# Patient Record
Sex: Female | Born: 1965 | Race: White | Hispanic: No | Marital: Married | Smoking: Former smoker
Health system: Southern US, Community
[De-identification: ages and names within clinical notes are randomized; demographics above are authoritative.]

## PROBLEM LIST (undated history)

## (undated) DIAGNOSIS — F32A Depression, unspecified: Secondary | ICD-10-CM

## (undated) DIAGNOSIS — K219 Gastro-esophageal reflux disease without esophagitis: Secondary | ICD-10-CM

## (undated) DIAGNOSIS — F1010 Alcohol abuse, uncomplicated: Secondary | ICD-10-CM

## (undated) DIAGNOSIS — F329 Major depressive disorder, single episode, unspecified: Secondary | ICD-10-CM

## (undated) DIAGNOSIS — I2510 Atherosclerotic heart disease of native coronary artery without angina pectoris: Secondary | ICD-10-CM

## (undated) DIAGNOSIS — I639 Cerebral infarction, unspecified: Secondary | ICD-10-CM

## (undated) DIAGNOSIS — Z720 Tobacco use: Secondary | ICD-10-CM

## (undated) DIAGNOSIS — I82409 Acute embolism and thrombosis of unspecified deep veins of unspecified lower extremity: Secondary | ICD-10-CM

## (undated) DIAGNOSIS — E785 Hyperlipidemia, unspecified: Secondary | ICD-10-CM

## (undated) DIAGNOSIS — I513 Intracardiac thrombosis, not elsewhere classified: Secondary | ICD-10-CM

## (undated) DIAGNOSIS — I509 Heart failure, unspecified: Secondary | ICD-10-CM

## (undated) HISTORY — DX: Atherosclerotic heart disease of native coronary artery without angina pectoris: I25.10

## (undated) HISTORY — DX: Heart failure, unspecified: I50.9

## (undated) HISTORY — DX: Intracardiac thrombosis, not elsewhere classified: I51.3

---
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
## 2019-08-04 ENCOUNTER — Encounter: Payer: Self-pay | Admitting: Emergency Medicine

## 2019-08-04 ENCOUNTER — Emergency Department: Payer: 59

## 2019-08-04 ENCOUNTER — Emergency Department
Admission: EM | Admit: 2019-08-04 | Discharge: 2019-08-04 | Disposition: A | Discharge status: Home / Self Care | Payer: 59 | Attending: Emergency Medicine | Admitting: Emergency Medicine

## 2019-08-04 ENCOUNTER — Other Ambulatory Visit: Payer: Self-pay

## 2019-08-04 DIAGNOSIS — Y999 Unspecified external cause status: Secondary | ICD-10-CM | POA: Diagnosis not present

## 2019-08-04 DIAGNOSIS — Y92019 Unspecified place in single-family (private) house as the place of occurrence of the external cause: Secondary | ICD-10-CM | POA: Diagnosis not present

## 2019-08-04 DIAGNOSIS — Y939 Activity, unspecified: Secondary | ICD-10-CM | POA: Diagnosis not present

## 2019-08-04 DIAGNOSIS — W19XXXA Unspecified fall, initial encounter: Secondary | ICD-10-CM | POA: Diagnosis not present

## 2019-08-04 DIAGNOSIS — T07XXXA Unspecified multiple injuries, initial encounter: Secondary | ICD-10-CM

## 2019-08-04 DIAGNOSIS — T148XXA Other injury of unspecified body region, initial encounter: Secondary | ICD-10-CM | POA: Insufficient documentation

## 2019-08-04 DIAGNOSIS — S32010A Wedge compression fracture of first lumbar vertebra, initial encounter for closed fracture: Secondary | ICD-10-CM | POA: Diagnosis not present

## 2019-08-04 DIAGNOSIS — M25571 Pain in right ankle and joints of right foot: Secondary | ICD-10-CM | POA: Diagnosis not present

## 2019-08-04 DIAGNOSIS — S3992XA Unspecified injury of lower back, initial encounter: Secondary | ICD-10-CM | POA: Diagnosis present

## 2019-08-04 MED ORDER — METHOCARBAMOL 500 MG PO TABS: 500.0000 mg | ORAL_TABLET | Freq: Once | ORAL | Status: DC

## 2019-08-04 MED ORDER — TRAMADOL HCL 50 MG PO TABS: 50.0000 mg | ORAL_TABLET | Freq: Four times a day (QID) | ORAL | 0 refills | Status: DC | PRN

## 2019-08-04 MED ORDER — METHOCARBAMOL 500 MG PO TABS
500.0000 mg | ORAL_TABLET | Freq: Once | ORAL | Status: AC
  Administered 2019-08-04: 09:00:00 500 mg via ORAL
  Filled 2019-08-04: qty 1

## 2019-08-04 MED ORDER — METHOCARBAMOL 500 MG PO TABS: 500.0000 mg | ORAL_TABLET | Freq: Four times a day (QID) | ORAL | 0 refills | Status: DC | PRN

## 2019-08-04 MED ORDER — TRAMADOL HCL 50 MG PO TABS
50.0000 mg | ORAL_TABLET | Freq: Once | ORAL | Status: AC
  Administered 2019-08-04: 50 mg via ORAL
  Filled 2019-08-04: qty 1

## 2019-08-04 MED ORDER — NAPROXEN 500 MG PO TABS: 500.0000 mg | ORAL_TABLET | Freq: Two times a day (BID) | ORAL | 0 refills | Status: DC

## 2019-08-04 MED ORDER — KETOROLAC TROMETHAMINE 30 MG/ML IJ SOLN
30.0000 mg | Freq: Once | INTRAMUSCULAR | Status: AC
  Administered 2019-08-04: 09:00:00 30 mg via INTRAMUSCULAR
  Filled 2019-08-04: qty 1

## 2019-08-04 NOTE — ED Notes (Signed)
Tried to get pt up on stretcher  States she hurts too bad to move provider aware

## 2019-08-04 NOTE — ED Provider Notes (Signed)
[facility] Emergency Department Provider Note  ____________________________________________   First MD Initiated Contact with Patient 08/04/19 [phone]     (approximate)  I have reviewed the triage vital signs and the nursing notes.   HISTORY  Chief Complaint Back Pain   HPI Briana Andrews is a 53 y.o. female presents to the ED via EMS with complaint of low back pain and right foot pain.  Patient states that she fell a couple of days ago.  She denies a syncopal episode causing her fall.  She denies any head injury or loss of consciousness.  She denies taking any over-the-counter medication for it as she "does not take pharmaceuticals".  She rates her pain as a 3 out of 10 unless she is trying to move.       History reviewed. No pertinent past medical history.  There are no active problems to display for this patient.   History reviewed. No pertinent surgical history.  Prior to Admission medications   Medication Sig Start Date End Date Taking? Authorizing Provider  methocarbamol (ROBAXIN) 500 MG tablet Take 1 tablet (500 mg total) by mouth every 6 (six) hours as needed for muscle spasms. 08/04/19   Tommi RumpsSummers, Becker Christopher L, PA-C  naproxen (NAPROSYN) 500 MG tablet Take 1 tablet (500 mg total) by mouth 2 (two) times daily with a meal. 08/04/19   Tommi RumpsSummers, Marquis Down L, PA-C  traMADol (ULTRAM) 50 MG tablet Take 1 tablet (50 mg total) by mouth every 6 (six) hours as needed. 08/04/19   Tommi RumpsSummers, Viviana Trimble L, PA-C    Allergies Penicillins  No family history on file.  Social History Social History   Tobacco Use  . Smoking status: Not on file  Substance Use Topics  . Alcohol use: Not on file  . Drug use: Not on file    Review of Systems Constitutional: No fever/chills Eyes: No visual changes. ENT: No trauma. Cardiovascular: Denies chest pain. Respiratory: Denies shortness of breath. Gastrointestinal: No abdominal pain.  No nausea, no vomiting. Genitourinary: Negative  for dysuria. Musculoskeletal: Positive for low back pain. Skin: Negative for rash. Neurological: Negative for headaches, focal weakness or numbness. ____________________________________________   PHYSICAL EXAM:  VITAL SIGNS: ED Triage Vitals  Enc Vitals Group     BP 08/04/19 0625 (!) 133/104     Pulse Rate 08/04/19 0625 (!) 102     Resp 08/04/19 0625 18     Temp 08/04/19 0625 97.7 F (36.5 C)     Temp Source 08/04/19 0625 Oral     SpO2 08/04/19 0625 97 %     Weight 08/04/19 0626 180 lb (81.6 kg)     Height 08/04/19 0626 5\' 5"  (1.651 m)     Head Circumference --      Peak Flow --      Pain Score 08/04/19 0626 3     Pain Loc --      Pain Edu? --      Excl. in GC? --    Constitutional: Alert and oriented. Well appearing and in no acute distress. Eyes: Conjunctivae are normal.  Head: Atraumatic. Neck: No stridor.  No cervical tenderness on palpation posteriorly. Cardiovascular: Normal rate, regular rhythm. Grossly normal heart sounds.  Good peripheral circulation. Respiratory: Normal respiratory effort.  No retractions. Lungs CTAB. Gastrointestinal: Soft and nontender. No distention.  Musculoskeletal: On exam there is no gross deformity however there is tenderness on palpation of the lumbar spine and paravertebral muscles.  Range of motion is restricted secondary to  pain and patient is guarding.  No ecchymosis or abrasions were seen.  Patient also extremely tender to her right ankle without deformity.  There is some minimal soft tissue edema present.  Pulse present.  Skin is intact.  No other muscle skeletal pain is noted with exam. Neurologic:  Normal speech and language. No gross focal neurologic deficits are appreciated.  Skin:  Skin is warm, dry and intact.  Psychiatric: Mood and affect are normal. Speech and behavior are normal.  ____________________________________________   LABS (all labs ordered are listed, but only abnormal results are displayed)  Labs Reviewed - No  data to display  RADIOLOGY  Official radiology report(s): Dg Lumbar Spine 2-3 Views  Result Date: 08/04/2019 CLINICAL DATA:  Post fall several days ago now with back pain. EXAM: LUMBAR SPINE - 2-3 VIEW COMPARISON:  None. FINDINGS: There are 5 non rib-bearing lumbar type vertebral bodies. There is a mild scoliotic curvature of the thoracolumbar spine with dominant caudal component convex the left measuring approximately 11 degrees (as measured from the superior endplate of D74 to the inferior endplate of L5). Grade 1 anterolisthesis of L4 upon L5 measuring approximately 8 mm. Age-indeterminate mild (approximately 25%) compression deformity involving the superior endplate of L1. Remaining lumbar vertebral body heights appear preserved Mild to moderate multilevel lumbar spine DDD, worse at L4-L5 and L5-S1 with disc space height loss endplate irregularity and sclerosis. Limited visualization the bilateral SI joints is normal. Atherosclerotic plaque within the abdominal aorta. Punctate phleboliths overlie the lower pelvis bilaterally. IMPRESSION: 1. Age-indeterminate mild (approximately 25%) compression deformity involving the superior endplate of L1. Correlation for point tenderness at this location is advised. 2. Mild-to-moderate multilevel lumbar spine DDD, worse at L4-L5 and L5-S1. Electronically Signed   By: Sandi Mariscal M.D.   On: 08/04/2019 08:40   Ct Lumbar Spine Wo Contrast  Result Date: 08/04/2019 CLINICAL DATA:  Fall 3 days ago EXAM: CT LUMBAR SPINE WITHOUT CONTRAST TECHNIQUE: Multidetector CT imaging of the lumbar spine was performed without intravenous contrast administration. Multiplanar CT image reconstructions were also generated. COMPARISON:  None. FINDINGS: Segmentation: 5 lumbar type vertebrae Alignment: Grade 1 degenerative anterolisthesis at L4-5. Vertebrae: Comminuted fracture of the L1 body involving anterior and posterior cortex. Trace retropulsion without spinal stenosis. No significant  height loss and no posterior element involvement. Paraspinal and other soft tissues: Paravertebral edema at the level of fracture. No measurable hematoma. Partial coverage of small pleural effusions. Disc levels: Generalized disc narrowing, advanced at L4-5 and L5-S1 where there is endplate degeneration. Lower lumbar facet arthropathy particularly severe at L4-5 where there is spurring and anterolisthesis with right foraminal stenosis. Moderate spinal stenosis at L4-5. IMPRESSION: 1. Acute, comminuted L1 body fracture with mild noncompressive retropulsion. 2. Advanced lower lumbar degenerative disease with L4-5 anterolisthesis and moderate spinal/right foraminal stenosis. 3. Partial coverage of small pleural effusions. Electronically Signed   By: Monte Fantasia M.D.   On: 08/04/2019 10:07   Dg Foot Complete Right  Result Date: 08/04/2019 CLINICAL DATA:  Injury to the right foot. EXAM: RIGHT FOOT COMPLETE - 3+ VIEW COMPARISON:  None. FINDINGS: There is diffuse soft tissue swelling about the mid forefoot. No associated fracture or dislocation. No significant hallux valgus. Joint spaces are preserved. No definite erosions. Moderate-sized plantar calcaneal spur. IMPRESSION: 1. Diffuse soft tissue swelling about the forefoot without associated fracture or dislocation. 2. Moderate-sized plantar calcaneal spur. Electronically Signed   By: Sandi Mariscal M.D.   On: 08/04/2019 08:37    ____________________________________________  PROCEDURES  Procedure(s) performed (including Critical Care):  Procedures   ____________________________________________   INITIAL IMPRESSION / ASSESSMENT AND PLAN / ED COURSE  As part of my medical decision making, I reviewed the following data within the electronic MEDICAL RECORD NUMBER Notes from prior ED visits and [place] Controlled Substance Database  53 year old female presents to the ED with complaint of back pain after a fall 3 days ago.  Patient states that she fell backwards  and now is having pain to her lower back and her right ankle.  X-rays of the right ankle was negative for fracture.  There was a L1 compression and CT confirmed that it is acute, comminuted with depression 25%.  Patient while in the ED was given Robaxin, Toradol IM and tramadol for pain.  Patient was feeling better prior to discharge.  She is to continue with naproxen, Robaxin and tramadol for pain as well as using ice or heat to her back.  She is to follow-up with Dr. Joice Lofts for follow-up and contact information was given to her.  ____________________________________________   FINAL CLINICAL IMPRESSION(S) / ED DIAGNOSES  Final diagnoses:  Compression fracture of L1 vertebra, initial encounter [facility])  Multiple contusions  Fall at home, initial encounter     ED Discharge Orders         Ordered    traMADol (ULTRAM) 50 MG tablet  Every 6 hours PRN     08/04/19 1121    methocarbamol (ROBAXIN) 500 MG tablet  Every 6 hours PRN     08/04/19 1121    naproxen (NAPROSYN) 500 MG tablet  2 times daily with meals     08/04/19 1121           Note:  This document was prepared using Dragon voice recognition software and may include unintentional dictation errors.    Tommi Rumps, PA-C 08/04/19 1346    Sharyn Creamer, MD 08/04/19 1540

## 2019-08-04 NOTE — Discharge Instructions (Addendum)
Follow-up with Dr. Roland Rack who is the orthopedist on call if any continued problems and also for follow-up.  You may use ice to your back as needed for discomfort.  Take medication only as directed.  Naproxen is 500 mg twice a day every day with food.  Methocarbamol is every 6 hours if needed for muscle spasms.  Tramadol as needed for pain.  Be aware that you should not drive or operate machinery while taking the methocarbamol and the tramadol.  Return to the emergency department if any severe worsening of your symptoms.

## 2019-08-04 NOTE — ED Notes (Signed)
See triage note  Presents with lower back pain and right foot pain  States she fell couple of days ago

## 2019-08-04 NOTE — ED Triage Notes (Signed)
Reports lower back pain for approximately 3 weeks.  Denies any known injury.

## 2019-08-04 NOTE — ED Notes (Signed)
See triage note  Presents s/p fall about 3 days ago  States she fell backwards  Having pain to lower back and right ankle  States she is not sure if she rolled her ankle  Min swelling noted   Good pulses

## 2019-08-15 ENCOUNTER — Inpatient Hospital Stay (HOSPITAL_COMMUNITY)
Admission: EM | Admit: 2019-08-15 | Discharge: 2019-09-13 | DRG: 037 | Disposition: A | Discharge status: Skilled Nursing Facility | Payer: 59 | Attending: Family Medicine | Admitting: Family Medicine

## 2019-08-15 ENCOUNTER — Other Ambulatory Visit: Payer: Self-pay

## 2019-08-15 DIAGNOSIS — I5023 Acute on chronic systolic (congestive) heart failure: Secondary | ICD-10-CM | POA: Diagnosis present

## 2019-08-15 DIAGNOSIS — E871 Hypo-osmolality and hyponatremia: Secondary | ICD-10-CM | POA: Diagnosis present

## 2019-08-15 DIAGNOSIS — Z515 Encounter for palliative care: Secondary | ICD-10-CM | POA: Diagnosis not present

## 2019-08-15 DIAGNOSIS — E861 Hypovolemia: Secondary | ICD-10-CM | POA: Diagnosis not present

## 2019-08-15 DIAGNOSIS — D751 Secondary polycythemia: Secondary | ICD-10-CM | POA: Diagnosis present

## 2019-08-15 DIAGNOSIS — Z978 Presence of other specified devices: Secondary | ICD-10-CM

## 2019-08-15 DIAGNOSIS — J9811 Atelectasis: Secondary | ICD-10-CM

## 2019-08-15 DIAGNOSIS — I82431 Acute embolism and thrombosis of right popliteal vein: Secondary | ICD-10-CM | POA: Diagnosis present

## 2019-08-15 DIAGNOSIS — I998 Other disorder of circulatory system: Secondary | ICD-10-CM | POA: Diagnosis present

## 2019-08-15 DIAGNOSIS — I5082 Biventricular heart failure: Secondary | ICD-10-CM | POA: Diagnosis present

## 2019-08-15 DIAGNOSIS — J690 Pneumonitis due to inhalation of food and vomit: Secondary | ICD-10-CM | POA: Diagnosis not present

## 2019-08-15 DIAGNOSIS — J189 Pneumonia, unspecified organism: Secondary | ICD-10-CM | POA: Diagnosis present

## 2019-08-15 DIAGNOSIS — R0602 Shortness of breath: Secondary | ICD-10-CM | POA: Diagnosis not present

## 2019-08-15 DIAGNOSIS — Z6829 Body mass index (BMI) 29.0-29.9, adult: Secondary | ICD-10-CM

## 2019-08-15 DIAGNOSIS — Z20822 Contact with and (suspected) exposure to covid-19: Secondary | ICD-10-CM

## 2019-08-15 DIAGNOSIS — I272 Pulmonary hypertension, unspecified: Secondary | ICD-10-CM | POA: Diagnosis present

## 2019-08-15 DIAGNOSIS — M549 Dorsalgia, unspecified: Secondary | ICD-10-CM | POA: Diagnosis present

## 2019-08-15 DIAGNOSIS — K760 Fatty (change of) liver, not elsewhere classified: Secondary | ICD-10-CM | POA: Diagnosis present

## 2019-08-15 DIAGNOSIS — E872 Acidosis, unspecified: Secondary | ICD-10-CM

## 2019-08-15 DIAGNOSIS — R64 Cachexia: Secondary | ICD-10-CM | POA: Diagnosis present

## 2019-08-15 DIAGNOSIS — K264 Chronic or unspecified duodenal ulcer with hemorrhage: Secondary | ICD-10-CM | POA: Diagnosis present

## 2019-08-15 DIAGNOSIS — Z880 Allergy status to penicillin: Secondary | ICD-10-CM

## 2019-08-15 DIAGNOSIS — K7030 Alcoholic cirrhosis of liver without ascites: Secondary | ICD-10-CM | POA: Diagnosis present

## 2019-08-15 DIAGNOSIS — N182 Chronic kidney disease, stage 2 (mild): Secondary | ICD-10-CM | POA: Diagnosis present

## 2019-08-15 DIAGNOSIS — D689 Coagulation defect, unspecified: Secondary | ICD-10-CM | POA: Diagnosis present

## 2019-08-15 DIAGNOSIS — I428 Other cardiomyopathies: Secondary | ICD-10-CM | POA: Diagnosis present

## 2019-08-15 DIAGNOSIS — E876 Hypokalemia: Secondary | ICD-10-CM | POA: Diagnosis not present

## 2019-08-15 DIAGNOSIS — Z66 Do not resuscitate: Secondary | ICD-10-CM | POA: Diagnosis not present

## 2019-08-15 DIAGNOSIS — I5043 Acute on chronic combined systolic (congestive) and diastolic (congestive) heart failure: Secondary | ICD-10-CM | POA: Diagnosis present

## 2019-08-15 DIAGNOSIS — I2510 Atherosclerotic heart disease of native coronary artery without angina pectoris: Secondary | ICD-10-CM | POA: Diagnosis present

## 2019-08-15 DIAGNOSIS — A419 Sepsis, unspecified organism: Secondary | ICD-10-CM | POA: Diagnosis not present

## 2019-08-15 DIAGNOSIS — K254 Chronic or unspecified gastric ulcer with hemorrhage: Secondary | ICD-10-CM | POA: Diagnosis present

## 2019-08-15 DIAGNOSIS — I509 Heart failure, unspecified: Secondary | ICD-10-CM

## 2019-08-15 DIAGNOSIS — F17200 Nicotine dependence, unspecified, uncomplicated: Secondary | ICD-10-CM | POA: Diagnosis present

## 2019-08-15 DIAGNOSIS — R278 Other lack of coordination: Secondary | ICD-10-CM | POA: Diagnosis present

## 2019-08-15 DIAGNOSIS — K7290 Hepatic failure, unspecified without coma: Secondary | ICD-10-CM | POA: Diagnosis present

## 2019-08-15 DIAGNOSIS — R17 Unspecified jaundice: Secondary | ICD-10-CM

## 2019-08-15 DIAGNOSIS — G9341 Metabolic encephalopathy: Secondary | ICD-10-CM | POA: Diagnosis present

## 2019-08-15 DIAGNOSIS — E43 Unspecified severe protein-calorie malnutrition: Secondary | ICD-10-CM | POA: Diagnosis present

## 2019-08-15 DIAGNOSIS — I739 Peripheral vascular disease, unspecified: Secondary | ICD-10-CM | POA: Diagnosis present

## 2019-08-15 DIAGNOSIS — L8990 Pressure ulcer of unspecified site, unspecified stage: Secondary | ICD-10-CM | POA: Diagnosis not present

## 2019-08-15 DIAGNOSIS — R6521 Severe sepsis with septic shock: Secondary | ICD-10-CM | POA: Diagnosis not present

## 2019-08-15 DIAGNOSIS — R402242 Coma scale, best verbal response, confused conversation, at arrival to emergency department: Secondary | ICD-10-CM | POA: Diagnosis present

## 2019-08-15 DIAGNOSIS — I63421 Cerebral infarction due to embolism of right anterior cerebral artery: Principal | ICD-10-CM | POA: Diagnosis present

## 2019-08-15 DIAGNOSIS — M4856XA Collapsed vertebra, not elsewhere classified, lumbar region, initial encounter for fracture: Secondary | ICD-10-CM | POA: Diagnosis present

## 2019-08-15 DIAGNOSIS — Z452 Encounter for adjustment and management of vascular access device: Secondary | ICD-10-CM

## 2019-08-15 DIAGNOSIS — F1020 Alcohol dependence, uncomplicated: Secondary | ICD-10-CM | POA: Diagnosis present

## 2019-08-15 DIAGNOSIS — I240 Acute coronary thrombosis not resulting in myocardial infarction: Secondary | ICD-10-CM | POA: Diagnosis present

## 2019-08-15 DIAGNOSIS — J9600 Acute respiratory failure, unspecified whether with hypoxia or hypercapnia: Secondary | ICD-10-CM | POA: Diagnosis not present

## 2019-08-15 DIAGNOSIS — I471 Supraventricular tachycardia: Secondary | ICD-10-CM | POA: Diagnosis not present

## 2019-08-15 DIAGNOSIS — R339 Retention of urine, unspecified: Secondary | ICD-10-CM | POA: Diagnosis not present

## 2019-08-15 DIAGNOSIS — Z9181 History of falling: Secondary | ICD-10-CM

## 2019-08-15 DIAGNOSIS — R402132 Coma scale, eyes open, to sound, at arrival to emergency department: Secondary | ICD-10-CM | POA: Diagnosis present

## 2019-08-15 DIAGNOSIS — R29704 NIHSS score 4: Secondary | ICD-10-CM | POA: Diagnosis present

## 2019-08-15 DIAGNOSIS — K2100 Gastro-esophageal reflux disease with esophagitis, without bleeding: Secondary | ICD-10-CM | POA: Diagnosis present

## 2019-08-15 DIAGNOSIS — I6340 Cerebral infarction due to embolism of unspecified cerebral artery: Secondary | ICD-10-CM | POA: Diagnosis present

## 2019-08-15 DIAGNOSIS — Z7189 Other specified counseling: Secondary | ICD-10-CM

## 2019-08-15 DIAGNOSIS — I513 Intracardiac thrombosis, not elsewhere classified: Secondary | ICD-10-CM | POA: Diagnosis present

## 2019-08-15 DIAGNOSIS — R739 Hyperglycemia, unspecified: Secondary | ICD-10-CM | POA: Diagnosis not present

## 2019-08-15 DIAGNOSIS — R570 Cardiogenic shock: Secondary | ICD-10-CM | POA: Diagnosis not present

## 2019-08-15 DIAGNOSIS — E669 Obesity, unspecified: Secondary | ICD-10-CM | POA: Diagnosis present

## 2019-08-15 DIAGNOSIS — R402352 Coma scale, best motor response, localizes pain, at arrival to emergency department: Secondary | ICD-10-CM | POA: Diagnosis present

## 2019-08-15 DIAGNOSIS — I6310 Cerebral infarction due to embolism of unspecified precerebral artery: Secondary | ICD-10-CM

## 2019-08-15 DIAGNOSIS — W19XXXA Unspecified fall, initial encounter: Secondary | ICD-10-CM | POA: Diagnosis present

## 2019-08-15 DIAGNOSIS — R7989 Other specified abnormal findings of blood chemistry: Secondary | ICD-10-CM | POA: Diagnosis present

## 2019-08-15 DIAGNOSIS — D696 Thrombocytopenia, unspecified: Secondary | ICD-10-CM | POA: Diagnosis present

## 2019-08-15 DIAGNOSIS — Z20828 Contact with and (suspected) exposure to other viral communicable diseases: Secondary | ICD-10-CM | POA: Diagnosis present

## 2019-08-15 DIAGNOSIS — R571 Hypovolemic shock: Secondary | ICD-10-CM | POA: Diagnosis not present

## 2019-08-15 DIAGNOSIS — I743 Embolism and thrombosis of arteries of the lower extremities: Secondary | ICD-10-CM | POA: Diagnosis present

## 2019-08-15 DIAGNOSIS — K922 Gastrointestinal hemorrhage, unspecified: Secondary | ICD-10-CM | POA: Diagnosis not present

## 2019-08-15 DIAGNOSIS — E860 Dehydration: Secondary | ICD-10-CM | POA: Diagnosis present

## 2019-08-15 DIAGNOSIS — D62 Acute posthemorrhagic anemia: Secondary | ICD-10-CM | POA: Diagnosis not present

## 2019-08-15 DIAGNOSIS — N939 Abnormal uterine and vaginal bleeding, unspecified: Secondary | ICD-10-CM | POA: Diagnosis present

## 2019-08-15 DIAGNOSIS — K7200 Acute and subacute hepatic failure without coma: Secondary | ICD-10-CM

## 2019-08-15 DIAGNOSIS — N179 Acute kidney failure, unspecified: Secondary | ICD-10-CM | POA: Diagnosis present

## 2019-08-15 DIAGNOSIS — Z79899 Other long term (current) drug therapy: Secondary | ICD-10-CM

## 2019-08-15 DIAGNOSIS — K7010 Alcoholic hepatitis without ascites: Secondary | ICD-10-CM | POA: Diagnosis present

## 2019-08-15 DIAGNOSIS — I472 Ventricular tachycardia: Secondary | ICD-10-CM | POA: Diagnosis present

## 2019-08-15 DIAGNOSIS — R945 Abnormal results of liver function studies: Secondary | ICD-10-CM | POA: Diagnosis present

## 2019-08-15 HISTORY — DX: Tobacco use: Z72.0

## 2019-08-15 HISTORY — DX: Alcohol abuse, uncomplicated: F10.10

## 2019-08-15 NOTE — ED Triage Notes (Signed)
Pt to ED with c/o shortness of breath x's 1 week. P'ts eyes and skin are jaundice  Pt also has a Petechiae on bil arms  Pt also has bruising and swelling to right foot with what appears to be a burn on it.   Pt also confused

## 2019-08-16 ENCOUNTER — Inpatient Hospital Stay (HOSPITAL_COMMUNITY): Payer: 59

## 2019-08-16 ENCOUNTER — Emergency Department (HOSPITAL_COMMUNITY): Payer: 59

## 2019-08-16 ENCOUNTER — Other Ambulatory Visit (HOSPITAL_COMMUNITY): Payer: 59

## 2019-08-16 ENCOUNTER — Encounter (HOSPITAL_COMMUNITY): Payer: Self-pay | Admitting: Internal Medicine

## 2019-08-16 DIAGNOSIS — E872 Acidosis, unspecified: Secondary | ICD-10-CM | POA: Diagnosis present

## 2019-08-16 DIAGNOSIS — N179 Acute kidney failure, unspecified: Secondary | ICD-10-CM | POA: Diagnosis not present

## 2019-08-16 DIAGNOSIS — I513 Intracardiac thrombosis, not elsewhere classified: Secondary | ICD-10-CM | POA: Diagnosis not present

## 2019-08-16 DIAGNOSIS — I428 Other cardiomyopathies: Secondary | ICD-10-CM | POA: Diagnosis present

## 2019-08-16 DIAGNOSIS — K7200 Acute and subacute hepatic failure without coma: Secondary | ICD-10-CM | POA: Diagnosis not present

## 2019-08-16 DIAGNOSIS — R17 Unspecified jaundice: Secondary | ICD-10-CM | POA: Diagnosis not present

## 2019-08-16 DIAGNOSIS — R570 Cardiogenic shock: Secondary | ICD-10-CM | POA: Diagnosis not present

## 2019-08-16 DIAGNOSIS — Z515 Encounter for palliative care: Secondary | ICD-10-CM | POA: Diagnosis not present

## 2019-08-16 DIAGNOSIS — I361 Nonrheumatic tricuspid (valve) insufficiency: Secondary | ICD-10-CM | POA: Diagnosis not present

## 2019-08-16 DIAGNOSIS — Z20828 Contact with and (suspected) exposure to other viral communicable diseases: Secondary | ICD-10-CM | POA: Diagnosis present

## 2019-08-16 DIAGNOSIS — R945 Abnormal results of liver function studies: Secondary | ICD-10-CM | POA: Diagnosis present

## 2019-08-16 DIAGNOSIS — A419 Sepsis, unspecified organism: Secondary | ICD-10-CM | POA: Diagnosis not present

## 2019-08-16 DIAGNOSIS — G9341 Metabolic encephalopathy: Secondary | ICD-10-CM | POA: Diagnosis present

## 2019-08-16 DIAGNOSIS — J690 Pneumonitis due to inhalation of food and vomit: Secondary | ICD-10-CM | POA: Diagnosis not present

## 2019-08-16 DIAGNOSIS — K922 Gastrointestinal hemorrhage, unspecified: Secondary | ICD-10-CM | POA: Diagnosis not present

## 2019-08-16 DIAGNOSIS — J9811 Atelectasis: Secondary | ICD-10-CM | POA: Diagnosis not present

## 2019-08-16 DIAGNOSIS — R7989 Other specified abnormal findings of blood chemistry: Secondary | ICD-10-CM | POA: Diagnosis present

## 2019-08-16 DIAGNOSIS — W19XXXD Unspecified fall, subsequent encounter: Secondary | ICD-10-CM

## 2019-08-16 DIAGNOSIS — I739 Peripheral vascular disease, unspecified: Secondary | ICD-10-CM | POA: Diagnosis not present

## 2019-08-16 DIAGNOSIS — R0602 Shortness of breath: Secondary | ICD-10-CM

## 2019-08-16 DIAGNOSIS — I63411 Cerebral infarction due to embolism of right middle cerebral artery: Secondary | ICD-10-CM | POA: Diagnosis not present

## 2019-08-16 DIAGNOSIS — Z720 Tobacco use: Secondary | ICD-10-CM | POA: Insufficient documentation

## 2019-08-16 DIAGNOSIS — R402132 Coma scale, eyes open, to sound, at arrival to emergency department: Secondary | ICD-10-CM | POA: Diagnosis present

## 2019-08-16 DIAGNOSIS — I6310 Cerebral infarction due to embolism of unspecified precerebral artery: Secondary | ICD-10-CM | POA: Diagnosis not present

## 2019-08-16 DIAGNOSIS — I639 Cerebral infarction, unspecified: Secondary | ICD-10-CM | POA: Diagnosis not present

## 2019-08-16 DIAGNOSIS — J189 Pneumonia, unspecified organism: Secondary | ICD-10-CM | POA: Diagnosis present

## 2019-08-16 DIAGNOSIS — R29704 NIHSS score 4: Secondary | ICD-10-CM | POA: Diagnosis present

## 2019-08-16 DIAGNOSIS — I2510 Atherosclerotic heart disease of native coronary artery without angina pectoris: Secondary | ICD-10-CM | POA: Diagnosis not present

## 2019-08-16 DIAGNOSIS — I340 Nonrheumatic mitral (valve) insufficiency: Secondary | ICD-10-CM

## 2019-08-16 DIAGNOSIS — R402242 Coma scale, best verbal response, confused conversation, at arrival to emergency department: Secondary | ICD-10-CM | POA: Diagnosis present

## 2019-08-16 DIAGNOSIS — R571 Hypovolemic shock: Secondary | ICD-10-CM | POA: Diagnosis not present

## 2019-08-16 DIAGNOSIS — I998 Other disorder of circulatory system: Secondary | ICD-10-CM | POA: Diagnosis not present

## 2019-08-16 DIAGNOSIS — I6389 Other cerebral infarction: Secondary | ICD-10-CM | POA: Diagnosis not present

## 2019-08-16 DIAGNOSIS — D62 Acute posthemorrhagic anemia: Secondary | ICD-10-CM | POA: Diagnosis not present

## 2019-08-16 DIAGNOSIS — K7290 Hepatic failure, unspecified without coma: Secondary | ICD-10-CM | POA: Diagnosis not present

## 2019-08-16 DIAGNOSIS — I472 Ventricular tachycardia: Secondary | ICD-10-CM | POA: Diagnosis present

## 2019-08-16 DIAGNOSIS — I743 Embolism and thrombosis of arteries of the lower extremities: Secondary | ICD-10-CM | POA: Diagnosis not present

## 2019-08-16 DIAGNOSIS — R402352 Coma scale, best motor response, localizes pain, at arrival to emergency department: Secondary | ICD-10-CM | POA: Diagnosis present

## 2019-08-16 DIAGNOSIS — E43 Unspecified severe protein-calorie malnutrition: Secondary | ICD-10-CM | POA: Diagnosis present

## 2019-08-16 DIAGNOSIS — I5043 Acute on chronic combined systolic (congestive) and diastolic (congestive) heart failure: Secondary | ICD-10-CM | POA: Diagnosis present

## 2019-08-16 DIAGNOSIS — I5023 Acute on chronic systolic (congestive) heart failure: Secondary | ICD-10-CM | POA: Diagnosis not present

## 2019-08-16 DIAGNOSIS — J9600 Acute respiratory failure, unspecified whether with hypoxia or hypercapnia: Secondary | ICD-10-CM | POA: Diagnosis not present

## 2019-08-16 DIAGNOSIS — F1010 Alcohol abuse, uncomplicated: Secondary | ICD-10-CM | POA: Insufficient documentation

## 2019-08-16 DIAGNOSIS — M549 Dorsalgia, unspecified: Secondary | ICD-10-CM | POA: Diagnosis present

## 2019-08-16 DIAGNOSIS — K254 Chronic or unspecified gastric ulcer with hemorrhage: Secondary | ICD-10-CM | POA: Diagnosis present

## 2019-08-16 DIAGNOSIS — I5021 Acute systolic (congestive) heart failure: Secondary | ICD-10-CM | POA: Diagnosis not present

## 2019-08-16 DIAGNOSIS — R6521 Severe sepsis with septic shock: Secondary | ICD-10-CM | POA: Diagnosis not present

## 2019-08-16 DIAGNOSIS — I63421 Cerebral infarction due to embolism of right anterior cerebral artery: Secondary | ICD-10-CM | POA: Diagnosis present

## 2019-08-16 DIAGNOSIS — Z7189 Other specified counseling: Secondary | ICD-10-CM | POA: Diagnosis not present

## 2019-08-16 DIAGNOSIS — W19XXXA Unspecified fall, initial encounter: Secondary | ICD-10-CM | POA: Diagnosis present

## 2019-08-16 DIAGNOSIS — K264 Chronic or unspecified duodenal ulcer with hemorrhage: Secondary | ICD-10-CM | POA: Diagnosis present

## 2019-08-16 DIAGNOSIS — Z66 Do not resuscitate: Secondary | ICD-10-CM | POA: Diagnosis not present

## 2019-08-16 LAB — RESPIRATORY PANEL BY PCR

## 2019-08-16 LAB — PROTIME-INR
INR: 1.7 — ABNORMAL HIGH (ref 0.8–1.2)
Prothrombin Time: 20.1 seconds — ABNORMAL HIGH (ref 11.4–15.2)

## 2019-08-16 LAB — CBC WITH DIFFERENTIAL/PLATELET
Abs Immature Granulocytes: 0.09 10*3/uL — ABNORMAL HIGH (ref 0.00–0.07)
Basophils Absolute: 0 10*3/uL (ref 0.0–0.1)
Basophils Relative: 0 %
Eosinophils Absolute: 0.1 10*3/uL (ref 0.0–0.5)
Eosinophils Relative: 1 %
HCT: 48.6 % — ABNORMAL HIGH (ref 36.0–46.0)
Hemoglobin: 16.9 g/dL — ABNORMAL HIGH (ref 12.0–15.0)
Immature Granulocytes: 1 %
Lymphocytes Relative: 15 %
Lymphs Abs: 1.5 10*3/uL (ref 0.7–4.0)
MCH: 35.1 pg — ABNORMAL HIGH (ref 26.0–34.0)
MCHC: 34.8 g/dL (ref 30.0–36.0)
MCV: 100.8 fL — ABNORMAL HIGH (ref 80.0–100.0)
Monocytes Absolute: 0.4 10*3/uL (ref 0.1–1.0)
Monocytes Relative: 4 %
Neutro Abs: 7.7 10*3/uL (ref 1.7–7.7)
Neutrophils Relative %: 79 %
Platelets: 134 10*3/uL — ABNORMAL LOW (ref 150–400)
RBC: 4.82 MIL/uL (ref 3.87–5.11)
RDW: 20.8 % — ABNORMAL HIGH (ref 11.5–15.5)
WBC: 9.7 10*3/uL (ref 4.0–10.5)
nRBC: 3.9 % — ABNORMAL HIGH (ref 0.0–0.2)

## 2019-08-16 LAB — APTT: aPTT: 31 seconds (ref 24–36)

## 2019-08-16 LAB — COMPREHENSIVE METABOLIC PANEL
ALT: 43 U/L (ref 0–44)
AST: 59 U/L — ABNORMAL HIGH (ref 15–41)
Albumin: 2.4 g/dL — ABNORMAL LOW (ref 3.5–5.0)
Alkaline Phosphatase: 83 U/L (ref 38–126)
Anion gap: 15 (ref 5–15)
BUN: 47 mg/dL — ABNORMAL HIGH (ref 6–20)
CO2: 21 mmol/L — ABNORMAL LOW (ref 22–32)
Calcium: 8.7 mg/dL — ABNORMAL LOW (ref 8.9–10.3)
Chloride: 97 mmol/L — ABNORMAL LOW (ref 98–111)
Creatinine, Ser: 1.64 mg/dL — ABNORMAL HIGH (ref 0.44–1.00)
GFR calc Af Amer: 41 mL/min — ABNORMAL LOW (ref >60)
GFR calc non Af Amer: 35 mL/min — ABNORMAL LOW (ref >60)
Glucose, Bld: 119 mg/dL — ABNORMAL HIGH (ref 70–99)
Potassium: 4.1 mmol/L (ref 3.5–5.1)
Sodium: 133 mmol/L — ABNORMAL LOW (ref 135–145)
Total Bilirubin: 12.7 mg/dL — ABNORMAL HIGH (ref 0.3–1.2)
Total Protein: 7.7 g/dL (ref 6.5–8.1)

## 2019-08-16 LAB — URINALYSIS, ROUTINE W REFLEX MICROSCOPIC
Glucose, UA: NEGATIVE mg/dL
Ketones, ur: NEGATIVE mg/dL
Nitrite: NEGATIVE
Protein, ur: 30 mg/dL — AB
Specific Gravity, Urine: 1.02 (ref 1.005–1.030)
pH: 5 (ref 5.0–8.0)

## 2019-08-16 LAB — RAPID URINE DRUG SCREEN, HOSP PERFORMED
Amphetamines: NOT DETECTED
Barbiturates: NOT DETECTED
Benzodiazepines: NOT DETECTED
Cocaine: NOT DETECTED
Opiates: NOT DETECTED
Tetrahydrocannabinol: POSITIVE — AB

## 2019-08-16 LAB — FERRITIN: Ferritin: 429 ng/mL — ABNORMAL HIGH (ref 11–307)

## 2019-08-16 LAB — INFLUENZA PANEL BY PCR (TYPE A & B)
Influenza A By PCR: NEGATIVE
Influenza B By PCR: NEGATIVE

## 2019-08-16 LAB — AMMONIA: Ammonia: 18 umol/L (ref 9–35)

## 2019-08-16 LAB — ETHANOL: Alcohol, Ethyl (B): <10 mg/dL (ref <10)

## 2019-08-16 LAB — IRON AND TIBC
Iron: 84 ug/dL (ref 28–170)
Saturation Ratios: 39 % — ABNORMAL HIGH (ref 10.4–31.8)
TIBC: 216 ug/dL — ABNORMAL LOW (ref 250–450)
UIBC: 132 ug/dL

## 2019-08-16 LAB — LIPASE, BLOOD: Lipase: 49 U/L (ref 11–51)

## 2019-08-16 LAB — VITAMIN B12: Vitamin B-12: 1894 pg/mL — ABNORMAL HIGH (ref 180–914)

## 2019-08-16 LAB — SARS CORONAVIRUS 2 BY RT PCR (HOSPITAL ORDER, PERFORMED IN ~~LOC~~ HOSPITAL LAB): SARS Coronavirus 2: NEGATIVE

## 2019-08-16 LAB — ECHOCARDIOGRAM COMPLETE
Height: 65 in
Weight: 2880 oz

## 2019-08-16 LAB — CBG MONITORING, ED: Glucose-Capillary: 105 mg/dL — ABNORMAL HIGH (ref 70–99)

## 2019-08-16 LAB — LACTIC ACID, PLASMA
Lactic Acid, Venous: 2.8 mmol/L (ref 0.5–1.9)
Lactic Acid, Venous: 2 mmol/L (ref 0.5–1.9)

## 2019-08-16 LAB — CK: Total CK: 196 U/L (ref 38–234)

## 2019-08-16 LAB — SEDIMENTATION RATE: Sed Rate: 0 mm/hr (ref 0–22)

## 2019-08-16 LAB — ANTITHROMBIN III: AntiThromb III Func: 66 % — ABNORMAL LOW (ref 75–120)

## 2019-08-16 LAB — ACETAMINOPHEN LEVEL: Acetaminophen (Tylenol), Serum: <10 ug/mL — ABNORMAL LOW (ref 10–30)

## 2019-08-16 LAB — STREP PNEUMONIAE URINARY ANTIGEN: Strep Pneumo Urinary Antigen: POSITIVE — AB

## 2019-08-16 MED ORDER — ONDANSETRON HCL 4 MG PO TABS
4.0000 mg | ORAL_TABLET | Freq: Four times a day (QID) | ORAL | Status: DC | PRN
  Filled 2019-08-16: qty 1

## 2019-08-16 MED ORDER — THIAMINE HCL 100 MG/ML IJ SOLN: 100.0000 mg | Freq: Every day | INTRAMUSCULAR | Status: DC

## 2019-08-16 MED ORDER — ASPIRIN 300 MG RE SUPP
300.0000 mg | Freq: Every day | RECTAL | Status: DC
  Administered 2019-08-16: 300 mg via RECTAL
  Filled 2019-08-16: qty 1

## 2019-08-16 MED ORDER — ALBUTEROL SULFATE HFA 108 (90 BASE) MCG/ACT IN AERS: 2.0000 | INHALATION_SPRAY | RESPIRATORY_TRACT | Status: DC | PRN

## 2019-08-16 MED ORDER — LORAZEPAM 1 MG PO TABS: 1.0000 mg | ORAL_TABLET | ORAL | Status: DC | PRN

## 2019-08-16 MED ORDER — VITAMIN B-1 100 MG PO TABS: 100.0000 mg | ORAL_TABLET | Freq: Every day | ORAL | Status: DC

## 2019-08-16 MED ORDER — THIAMINE HCL 100 MG/ML IJ SOLN
500.0000 mg | Freq: Every day | INTRAVENOUS | Status: AC
  Administered 2019-08-16 – 2019-08-19 (×4): 500 mg via INTRAVENOUS
  Filled 2019-08-16 (×5): qty 5

## 2019-08-16 MED ORDER — ASPIRIN 325 MG PO TABS: 325.0000 mg | ORAL_TABLET | Freq: Every day | ORAL | Status: DC

## 2019-08-16 MED ORDER — STROKE: EARLY STAGES OF RECOVERY BOOK
Freq: Once | Status: AC
  Administered 2019-08-16: 18:00:00

## 2019-08-16 MED ORDER — SODIUM CHLORIDE 0.9 % IV SOLN
INTRAVENOUS | Status: DC
  Administered 2019-08-16: 06:00:00 via INTRAVENOUS

## 2019-08-16 MED ORDER — OXYCODONE HCL 5 MG PO TABS
5.0000 mg | ORAL_TABLET | Freq: Four times a day (QID) | ORAL | Status: DC | PRN
  Administered 2019-08-16 – 2019-08-23 (×10): 5 mg via ORAL
  Filled 2019-08-16 (×10): qty 1

## 2019-08-16 MED ORDER — FOLIC ACID 1 MG PO TABS
1.0000 mg | ORAL_TABLET | Freq: Every day | ORAL | Status: DC
  Administered 2019-08-16 – 2019-08-25 (×9): 1 mg via ORAL
  Filled 2019-08-16 (×10): qty 1

## 2019-08-16 MED ORDER — SODIUM CHLORIDE 0.9 % IV BOLUS
1000.0000 mL | Freq: Once | INTRAVENOUS | Status: AC
  Administered 2019-08-16: 06:00:00 1000 mL via INTRAVENOUS

## 2019-08-16 MED ORDER — PHYTONADIONE 5 MG PO TABS
2.5000 mg | ORAL_TABLET | Freq: Once | ORAL | Status: DC
  Filled 2019-08-16: qty 1

## 2019-08-16 MED ORDER — LORAZEPAM 2 MG/ML IJ SOLN: 0.0000 mg | Freq: Two times a day (BID) | INTRAMUSCULAR | Status: DC

## 2019-08-16 MED ORDER — METHOCARBAMOL 500 MG PO TABS
500.0000 mg | ORAL_TABLET | Freq: Four times a day (QID) | ORAL | Status: DC | PRN
  Administered 2019-08-16 – 2019-08-18 (×2): 500 mg via ORAL
  Filled 2019-08-16 (×2): qty 1

## 2019-08-16 MED ORDER — ADULT MULTIVITAMIN W/MINERALS CH
1.0000 | ORAL_TABLET | Freq: Every day | ORAL | Status: DC
  Administered 2019-08-16 – 2019-08-25 (×8): 1 via ORAL
  Filled 2019-08-16 (×10): qty 1

## 2019-08-16 MED ORDER — GADOBUTROL 1 MMOL/ML IV SOLN
8.0000 mL | Freq: Once | INTRAVENOUS | Status: AC | PRN
  Administered 2019-08-16: 17:00:00 8 mL via INTRAVENOUS

## 2019-08-16 MED ORDER — ALBUTEROL SULFATE (2.5 MG/3ML) 0.083% IN NEBU: 2.5000 mg | INHALATION_SOLUTION | RESPIRATORY_TRACT | Status: DC | PRN

## 2019-08-16 MED ORDER — LEVOFLOXACIN IN D5W 750 MG/150ML IV SOLN
750.0000 mg | INTRAVENOUS | Status: DC
  Administered 2019-08-16: 08:00:00 750 mg via INTRAVENOUS
  Filled 2019-08-16 (×2): qty 150

## 2019-08-16 MED ORDER — DM-GUAIFENESIN ER 30-600 MG PO TB12
1.0000 | ORAL_TABLET | Freq: Two times a day (BID) | ORAL | Status: DC | PRN
  Filled 2019-08-16: qty 1

## 2019-08-16 MED ORDER — RIFAXIMIN 550 MG PO TABS
550.0000 mg | ORAL_TABLET | Freq: Two times a day (BID) | ORAL | Status: DC
  Administered 2019-08-16 – 2019-08-25 (×16): 550 mg via ORAL
  Filled 2019-08-16 (×21): qty 1

## 2019-08-16 MED ORDER — LACTULOSE 10 GM/15ML PO SOLN
10.0000 g | Freq: Two times a day (BID) | ORAL | Status: DC | PRN
  Filled 2019-08-16: qty 15

## 2019-08-16 MED ORDER — ONDANSETRON HCL 4 MG/2ML IJ SOLN
4.0000 mg | Freq: Four times a day (QID) | INTRAMUSCULAR | Status: DC | PRN
  Administered 2019-08-18 (×2): 4 mg via INTRAVENOUS
  Filled 2019-08-16 (×2): qty 2

## 2019-08-16 MED ORDER — LORAZEPAM 2 MG/ML IJ SOLN: 1.0000 mg | INTRAMUSCULAR | Status: DC | PRN

## 2019-08-16 MED ORDER — LORAZEPAM 2 MG/ML IJ SOLN
0.0000 mg | Freq: Four times a day (QID) | INTRAMUSCULAR | Status: DC
  Administered 2019-08-16: 09:00:00 1 mg via INTRAVENOUS
  Filled 2019-08-16: qty 1

## 2019-08-16 MED ORDER — NICOTINE 21 MG/24HR TD PT24
21.0000 mg | MEDICATED_PATCH | Freq: Every day | TRANSDERMAL | Status: DC
  Administered 2019-08-16 – 2019-09-04 (×13): 21 mg via TRANSDERMAL
  Filled 2019-08-16 (×24): qty 1

## 2019-08-16 MED ORDER — DEXTROSE-NACL 5-0.9 % IV SOLN
INTRAVENOUS | Status: DC
  Administered 2019-08-16 – 2019-08-18 (×5): via INTRAVENOUS

## 2019-08-16 NOTE — ED Notes (Signed)
Pt to MRI

## 2019-08-16 NOTE — ED Notes (Signed)
Breakfast ordered 

## 2019-08-16 NOTE — Progress Notes (Signed)
Pharmacy Antibiotic Note  Briana Andrews is a 53 y.o. female admitted on 08/15/2019 with pneumonia.  Pharmacy has been consulted for Levaquin dosing.  Plan: Levaquin 750mg  IV q48h Will f/u renal function, micro data, and pt's clinical condition  Height: 5\' 5"  (165.1 cm) Weight: 180 lb (81.6 kg) IBW/kg (Calculated) : 57  Temp (24hrs), Avg:98 F (36.7 C), Min:98 F (36.7 C), Max:98 F (36.7 C)  Recent Labs  Lab 08/15/19 2350 08/16/19 0400  WBC 9.7  --   CREATININE 1.64*  --   LATICACIDVEN 2.8* 2.0*    Estimated Creatinine Clearance: 41.4 mL/min (A) (by C-G formula based on SCr of 1.64 mg/dL (H)).    Allergies  Allergen Reactions  . Penicillins Other (See Comments)    Unsure of reaction   9/19 Levaquin >>   COVID: negative  Thank you for allowing pharmacy to be a part of this patient's care.  Sherlon Handing, PharmD, BCPS 08/16/2019 6:47 AM

## 2019-08-16 NOTE — ED Notes (Signed)
Pt is NSR w/BBB, w/PVCs on monitor

## 2019-08-16 NOTE — ED Notes (Signed)
Unable to give po meds at this time, because pt won't stay awake

## 2019-08-16 NOTE — ED Notes (Signed)
Pt is lethargic, pt won't stay awake. She will briefly obey a command then fall back to sleep

## 2019-08-16 NOTE — ED Notes (Signed)
Admitting MD at bedside.

## 2019-08-16 NOTE — Progress Notes (Signed)
PROGRESS NOTE    Briana Andrews  MGQ:[id number] DOB: 1966/11/13 DOA: 08/15/2019 PCP: Patient, [name]    Brief Narrative:   53 year old female with history of smoking, alcohol use, she drinks more than 6 packs of beer every day since last 20 years, no chronic medical issues brought to the emergency room with confusion more than usual and falls. Patient's husband recalls that she has blackish spots on her legs and they were told that she has high iron levels. Patient is unreliable historian.  She was admitted early morning hours by nighttime hospitalist.  I called and discussed with patient's husband and took detailed history. According to the patient's husband, she has been doing well until this summer.  Since last few months there have been noticing some changes on her including weakness on her legs and poor appetite as much as she stopped smoking and decreased her drinking.  2 weeks ago, she was taken to [facility] with a fall, skeletal survey was negative except L1 fracture with 25% height loss and discharged home on muscle relaxants.  Patient has been intermittently confused since then.  After coming to the emergency room she was found with multiple problems as below. In the emergency room, she was found with subacute right ACA stroke, jaundice with bilirubin of 12, bilateral multifocal pneumonia and metabolic encephalopathy.   Assessment & Plan:   Principal Problem:   Abnormal LFTs Active Problems:   Acute metabolic encephalopathy   SOB (shortness of breath)   AKI (acute kidney injury) ([place])   Fall   Lactic acidosis   Back pain   Acute liver failure   Multifocal pneumonia  Multifocal pneumonia: Abnormal chest x-ray.  Also suspect noninfectious cause. Chest x-ray consistent with multiple area opacities.  WBC count normal.  Lactic acid was elevated, however also has liver disease.  Started on Levaquin that we will continue.  Patient does not have obvious pulmonary  symptoms.  Reasonable to treat as pneumonia, will check CT scan of the chest after 24 to 48 hours of treatment.  Questionable, may represent some occult malignancy in a smoker.  Blood cultures were done, results are pending.  Legionella, streptococcal antigen, respiratory virus panel.  Oxygen as needed.  Bronchodilator as needed.  Subacute ischemic stroke, right ACA with encephalopathy: Admit to monitored unit because of severity of symptoms. Neurochecks and vital signs as per stroke protocol. Patient was not a TPA and vascular intervention candidate because of unknown duration of symptoms. Passed bedside swallow test, will start on diet. Will start patient on aspirin. Statin, contraindicated due to liver disease. Consultations, neurology, speech, PT OT MRI of the brain shows right ACA territory stroke, no hemorrhage. carotid ultrasound 2D echocardiogram Lipid panel and A1c. Discussed with neurology, atypical looking MRI brain, suggested MRI with contrast.  Abnormal LFTs: Suspect chronic liver disease.  Transaminases are minimally elevated.  CT scan abdomen with no evidence of biliary abnormalities.  Symptomatic treatment with IV fluids.  Recheck levels tomorrow.  Ammonia in normal.  Also has suspicious history of hemachromatosis, check ESR, CRP, iron panel, O67 and folic acid.  Discussed with gastroeneterology and seen in consult. Appreciate . Started on Rifaxin.  Bilateral lower leg pigmentation and discoloration: Patient probably has chronic changes, she also has pigmentation.  She does have dopplerable pulse.  Right foot has skin sloughing on the dorsum.  Unlikely acute ischemic changes.  Will check ABI.  Alcoholism / encephalopathy:  Patient is high risk of withdrawal.  She probably has  alcoholic liver disease, also possible hemochromatosis with history of high iron levels as per family. She is at risk of withdrawal, however now she is mostly sleepy and lethargic.  Was given 1 dose of  Ativan for MRI and she became more lethargic. Discontinue benzodiazepines. We will treat patient with high dose of thiamine 500 mg IV for 5 days.  Consult called and case discussed with neurology and gastroenterology. Updated, detailed history taken from patient's husband.  DVT prophylaxis: SCDs Code Status: Full code Family Communication: Husband and son Disposition Plan: Unknown at this time.   Consultants:   Gastroenterology  Neurology  Procedures:   None  Antimicrobials:   Levaquin, 08/16/2019   Subjective: Patient seen and examined.  Discussed with multiple providers.  Detailed history taken. When I examined the patient first, she was awake and slightly lethargic but he she was able to give a limited history.  1 mg Ativan was given for MRI and she was totally sleepy after that.  Objective: Vitals:   08/16/19 1030 08/16/19 1100 08/16/19 1115 08/16/19 1130  BP: 127/89 117/80 117/84 120/81  Pulse: 96 93    Resp: 19 (!) 23 (!) 26 (!) 28  Temp:      TempSrc:      SpO2: 100% 100%    Weight:      Height:        Intake/Output Summary (Last 24 hours) at 08/16/2019 1200 Last data filed at 08/16/2019 0915 Gross per 24 hour  Intake 2064.75 ml  Output --  Net 2064.75 ml   Filed Weights   08/15/19 2345  Weight: 81.6 kg    Examination:  General exam: Appears calm but sick looking.  Jaundiced. Respiratory system: Clear to auscultation. Respiratory effort normal.  No added sound. Cardiovascular system: S1 & S2 heard, RRR. No JVD, murmurs, rubs, gallops or clicks. No pedal edema. Gastrointestinal system: Abdomen is nondistended, soft and nontender. No organomegaly or masses felt. Normal bowel sounds heard.  No ascites. Central nervous system: Alert and awake before Ativan.  Lethargic after Ativan.  She was able to move all extremities. Extremities: Symmetric 5 x 5 power. Skin: No rashes, skin sloughing and abrasion right dorsum of the foot. Psychiatry: Judgement and  insight appear compromised.  mood & affect flat. Bilateral lower extremity: Dusky discoloration of the tips of the toes mostly on the plantar surface, peripheral pulses are present and dopplerable, she also has dark pigmentation all throughout the dorsum of the foot.    Data Reviewed: I have personally reviewed following labs and imaging studies  CBC: Recent Labs  Lab 08/15/19 2350  WBC 9.7  NEUTROABS 7.7  HGB 16.9*  HCT 48.6*  MCV 100.8*  PLT 222*   Basic Metabolic Panel: Recent Labs  Lab 08/15/19 2350  NA 133*  K 4.1  CL 97*  CO2 21*  GLUCOSE 119*  BUN 47*  CREATININE 1.64*  CALCIUM 8.7*   GFR: Estimated Creatinine Clearance: 41.4 mL/min (A) (by C-G formula based on SCr of 1.64 mg/dL (H)). Liver Function Tests: Recent Labs  Lab 08/15/19 2350  AST 59*  ALT 43  ALKPHOS 83  BILITOT 12.7*  PROT 7.7  ALBUMIN 2.4*   Recent Labs  Lab 08/15/19 2350  LIPASE 49   Recent Labs  Lab 08/15/19 2350  AMMONIA 18   Coagulation Profile: Recent Labs  Lab 08/16/19 0016  INR 1.7*   Cardiac Enzymes: No results for input(s): CKTOTAL, CKMB, CKMBINDEX, TROPONINI in the last 168 hours. BNP (last 3  results) No results for input(s): PROBNP in the last 8760 hours. HbA1C: No results for input(s): HGBA1C in the last 72 hours. CBG: Recent Labs  Lab 08/16/19 1028  GLUCAP 105*   Lipid Profile: No results for input(s): CHOL, HDL, LDLCALC, TRIG, CHOLHDL, LDLDIRECT in the last 72 hours. Thyroid Function Tests: No results for input(s): TSH, T4TOTAL, FREET4, T3FREE, THYROIDAB in the last 72 hours. Anemia Panel: No results for input(s): VITAMINB12, FOLATE, FERRITIN, TIBC, IRON, RETICCTPCT in the last 72 hours. Sepsis Labs: Recent Labs  Lab 08/15/19 2350 08/16/19 0400  LATICACIDVEN 2.8* 2.0*    Recent Results (from the past 240 hour(s))  SARS Coronavirus 2 [facility] order, Performed in [facility] hospital lab) Nasopharyngeal Nasopharyngeal Swab     Status: None    Collection Time: 08/16/19  3:03 AM   Specimen: Nasopharyngeal Swab  Result Value Ref Range Status   SARS Coronavirus 2 NEGATIVE NEGATIVE Final    Comment: (NOTE) If result is NEGATIVE SARS-CoV-2 target nucleic acids are NOT DETECTED. The SARS-CoV-2 RNA is generally detectable in upper and lower  respiratory specimens during the acute phase of infection. The lowest  concentration of SARS-CoV-2 viral copies this assay can detect is 250  copies / mL. A negative result does not preclude SARS-CoV-2 infection  and should not be used as the sole basis for treatment or other  patient management decisions.  A negative result may occur with  improper specimen collection / handling, submission of specimen other  than nasopharyngeal swab, presence of viral mutation(s) within the  areas targeted by this assay, and inadequate number of viral copies  (<250 copies / mL). A negative result must be combined with clinical  observations, patient history, and epidemiological information. If result is POSITIVE SARS-CoV-2 target nucleic acids are DETECTED. The SARS-CoV-2 RNA is generally detectable in upper and lower  respiratory specimens dur ing the acute phase of infection.  Positive  results are indicative of active infection with SARS-CoV-2.  Clinical  correlation with patient history and other diagnostic information is  necessary to determine patient infection status.  Positive results do  not rule out bacterial infection or co-infection with other viruses. If result is PRESUMPTIVE POSTIVE SARS-CoV-2 nucleic acids MAY BE PRESENT.   A presumptive positive result was obtained on the submitted specimen  and confirmed on repeat testing.  While 2019 novel coronavirus  (SARS-CoV-2) nucleic acids may be present in the submitted sample  additional confirmatory testing may be necessary for epidemiological  and / or clinical management purposes  to differentiate between  SARS-CoV-2 and other Sarbecovirus  currently known to infect humans.  If clinically indicated additional testing with an alternate test  methodology [id number]) is advised. The SARS-CoV-2 RNA is generally  detectable in upper and lower respiratory sp ecimens during the acute  phase of infection. The expected result is Negative. Fact Sheet for Patients:  StrictlyIdeas.no Fact Sheet for Healthcare Providers: BankingDealers.co.za This test is not yet approved or cleared by the [place] FDA and has been authorized for detection and/or diagnosis of SARS-CoV-2 by FDA under an Emergency Use Authorization (EUA).  This EUA will remain in effect (meaning this test can be used) for the duration of the COVID-19 declaration under Section 564(b)(1) of the Act, 21 U.S.C. section 360bbb-3(b)(1), unless the authorization is terminated or revoked sooner. Performed at [facility], [place] [address], [place], [place] [address]          Radiology Studies: Ct Abdomen Pelvis Wo Contrast  Result  Date: 08/16/2019 CLINICAL DATA:  Shortness of breath for 1 week.  Jaundice. EXAM: CT ABDOMEN AND PELVIS WITHOUT CONTRAST TECHNIQUE: Multidetector CT imaging of the abdomen and pelvis was performed following the standard protocol without IV contrast. COMPARISON:  None. FINDINGS: Lower chest: Moderate right pleural effusion. Small left pleural effusion. Patchy airspace opacities in the lung bases consistent with atelectasis, pneumonia or a combination. A component of pneumonia in the left lower lobe is suspected. Moderate heart enlargement. Hepatobiliary: No focal liver abnormality is seen. No gallstones, gallbladder wall thickening, or biliary dilatation. Pancreas: Unremarkable. No pancreatic ductal dilatation or surrounding inflammatory changes. Spleen: Normal in size without focal abnormality. Adrenals/Urinary Tract: No adrenal masses. Kidneys normal size, orientation and position. No renal masses,  stones or hydronephrosis. Normal ureters. Normal bladder. Stomach/Bowel: Stomach is within normal limits. Appendix appears normal. No evidence of bowel wall thickening, distention, or inflammatory changes. Vascular/Lymphatic: Aortic atherosclerosis. No aneurysm. No enlarged lymph nodes. Reproductive: Uterus and bilateral adnexa are unremarkable. Other: No abdominal wall hernia or abnormality. No abdominopelvic ascites. Musculoskeletal: Fracture of the upper L1 vertebra with mild endplate depression. This appears recent. No other fractures. No bone lesions. IMPRESSION: 1. Moderate cardiomegaly. Moderate-sized right pleural effusion. Small left pleural effusion. 2. Patchy lung base airspace opacities most prominent in the left lower lobe. Consider pneumonia if there are consistent clinical findings. Findings could be due to atelectasis. 3. Recent appearing nondisplaced fracture the upper L1 vertebra with mild depression of the upper endplate. 4. No other acute findings within the abdomen or pelvis. No evidence of bile duct dilation. No liver mass or abnormal attenuation. Cause of jaundice not evident. 5. Aortic atherosclerosis. Electronically Signed   By: Lajean Manes M.D.   On: 08/16/2019 07:13   Ct Head Wo Contrast  Result Date: 08/16/2019 CLINICAL DATA:  Encephalopathy EXAM: CT HEAD WITHOUT CONTRAST TECHNIQUE: Contiguous axial images were obtained from the base of the skull through the vertex without intravenous contrast. COMPARISON:  None. FINDINGS: Brain: Focal hypoattenuation within the right frontal lobe, within the anterior cerebral artery distribution. No intracranial hemorrhage or extra-axial collection. No midline shift, other mass effect or hydrocephalus. Vascular: No abnormal hyperdensity of the major intracranial arteries or dural venous sinuses. No intracranial atherosclerosis. Skull: The visualized skull base, calvarium and extracranial soft tissues are normal. Sinuses/Orbits: No fluid levels or  advanced mucosal thickening of the visualized paranasal sinuses. No mastoid or middle ear effusion. The orbits are normal. IMPRESSION: 1. Focal hypoattenuation within the paramedian right frontal lobe, likely indicating late acute to subacute infarct. MRI may be helpful for better temporal characterization. 2. No hemorrhage or mass effect. Electronically Signed   By: Ulyses Jarred M.D.   On: 08/16/2019 06:57   Mr Brain Wo Contrast  Result Date: 08/16/2019 CLINICAL DATA:  TIA. Altered mental status and generalized weakness. EXAM: MRI HEAD WITHOUT CONTRAST TECHNIQUE: Multiplanar, multiecho pulse sequences of the brain and surrounding structures were obtained without intravenous contrast. COMPARISON:  Head CT 08/16/2019 FINDINGS: Brain: An approximately 3.5 cm wedge-shaped region of T2 hyperintensity, trace diffusion hyperintensity, and facilitated diffusion in the medial right frontal lobe involves white matter and cortex and is most consistent with a subacute ACA territory infarct. A 3 mm focus of diffusion abnormality involving right parietal white matter also likely reflects a subacute infarct. Low level diffusion abnormality involving the right caudate head may also reflect a subacute infarct. No intracranial hemorrhage, mass effect, or extra-axial fluid collection is identified. The ventricles and sulci are within normal limits  for age. Scattered small foci of T2 hyperintensity in the cerebral white matter bilaterally are nonspecific but compatible with mild chronic small vessel ischemic disease. There are small chronic left cerebellar infarcts. Vascular: Major intracranial vascular flow voids are preserved. Skull and upper cervical spine: Unremarkable bone marrow signal. Sinuses/Orbits: Unremarkable orbits. Paranasal sinuses and mastoid air cells are clear. Other: None. IMPRESSION: 1. 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 2. Suspected small subacute infarcts involving the right caudate  nucleus and right parietal white matter. 3. Chronic left cerebellar infarcts. Electronically Signed   By: Logan Bores M.D.   On: 08/16/2019 10:48   Dg Chest Portable 1 View  Result Date: 08/16/2019 CLINICAL DATA:  Shortness of breath EXAM: PORTABLE CHEST 1 VIEW COMPARISON:  None. FINDINGS: There are multiple areas of increased airspace opacity within both lungs, including the right upper lobe, right lung base and left lung base. There is mild cardiomegaly. No pneumothorax or sizable pleural effusion. IMPRESSION: 1. Multifocal airspace disease in both lungs, which may indicate multifocal infection. 2. Mild cardiomegaly. Electronically Signed   By: Ulyses Jarred M.D.   On: 08/16/2019 03:35   Dg Foot Complete Right  Result Date: 08/16/2019 CLINICAL DATA:  Shortness of breath swelling to the right foot EXAM: RIGHT FOOT COMPLETE - 3+ VIEW COMPARISON:  None. FINDINGS: No fracture or malalignment. Moderate to large plantar calcaneal spur. IMPRESSION: No acute osseous abnormality. Electronically Signed   By: Donavan Foil M.D.   On: 08/16/2019 03:09        Scheduled Meds:   stroke: mapping our early stages of recovery book   Does not apply Once   aspirin  300 mg Rectal Daily   Or   aspirin  325 mg Oral Daily   folic acid  1 mg Oral Daily   multivitamin with minerals  1 tablet Oral Daily   nicotine  21 mg Transdermal Daily   rifaximin  550 mg Oral BID   Continuous Infusions:  dextrose 5 % and 0.9% NaCl     levofloxacin (LEVAQUIN) IV Stopped (08/16/19 0915)   thiamine injection 500 mg (08/16/19 1134)     LOS: 0 days    Time spent: 60 minutes    Barb Merino, MD [facility] Pager [phone]  If 7PM-7AM, please contact night-coverage [email or web address] Password [email or web address] 08/16/2019, 12:00 PM

## 2019-08-16 NOTE — ED Notes (Addendum)
Pt altered since returning to ED from MRI.  Dr Lovena Neighbours paged and responded.

## 2019-08-16 NOTE — ED Notes (Signed)
Spoke w/brother Carleene Overlie and gave him an update on pt.

## 2019-08-16 NOTE — ED Notes (Signed)
CT Admit

## 2019-08-16 NOTE — Consult Note (Signed)
[facility] Nurse wound consult note Reason for Consult: laceration to dorsal aspect of right foot.  Wound type:Traumatic, sustained during fall last week Pressure Injury POA: N/A Measurement:Per our discussion, bedside RN to obtain measurements today and document on the Flow Sheet Wound bed:With appearance of ruptured blister, dry Drainage (amount, consistency, odor) serous on sheet in small amount.  Periwound:scattered petechiae Dressing procedure/placement/frequency: I will implement a conservative POC to prevent infection and dry area of excess moisture.  The antimicrobial and astringent characteristics of xeroform will aid in the repair of this tissue. No fracture indicated (xray at [facility] last week) and positive posterior tibialis pulses obtained by ED MD (per note).  [facility] nursing team will not follow, but will remain available to this patient, the nursing and medical teams.  Please re-consult if needed. Thanks, Maudie Flakes, MSN, RN, [place], Arther Abbott  Pager# [phone]

## 2019-08-16 NOTE — Consult Note (Addendum)
[facility]ician:  Patient, [name] Primary Gastroenterologist: Unassigned  Reason for Consultation: Jaundice, altered mental status  HPI: Briana Andrews is a 53 y.o. female with past medical history of alcohol and tobacco use presented to the hospital with generalized weakness and altered mental status.  Upon initial evaluation, she was found to have abnormal LFTs with T bili of 12.7, mild elevated AST, ALT and alkaline phosphatase.  INR 1.7.  GI is consulted for further evaluation.  Patient seen and examined at bedside in the emergency room.  Patient somewhat lethargic and drowsy.  Not able to give proper history.  She is not sure how long she is been having current symptoms.  Admits alcohol use.  Past Medical History:  Diagnosis Date  . Alcohol abuse   . Tobacco abuse     No past surgical history on file.  Prior to Admission medications   Medication Sig Start Date End Date Taking? Authorizing Provider  ibuprofen (ADVIL) 200 MG tablet Take 200 mg by mouth every 6 (six) hours as needed for mild pain.   Yes [provider]  naproxen (NAPROSYN) 500 MG tablet Take 1 tablet (500 mg total) by mouth 2 (two) times daily with a meal. Patient not taking: Reported on 08/16/2019 08/04/19   Tommi RumpsSummers, Rhonda L, PA-C  traMADol (ULTRAM) 50 MG tablet Take 1 tablet (50 mg total) by mouth every 6 (six) hours as needed. Patient not taking: Reported on 08/16/2019 08/04/19   Tommi RumpsSummers, Rhonda L, PA-C    Scheduled Meds: . folic acid  1 mg Oral Daily  . multivitamin with minerals  1 tablet Oral Daily  . nicotine  21 mg Transdermal Daily   Continuous Infusions: . levofloxacin (LEVAQUIN) IV Stopped (08/16/19 0915)  . thiamine injection     PRN Meds:.albuterol, dextromethorphan-guaiFENesin, methocarbamol, ondansetron **OR** ondansetron (ZOFRAN) IV, oxyCODONE  Allergies as of 08/15/2019 - Review Complete 08/04/2019  Allergen Reaction Noted  . Penicillins Other (See  Comments) 08/04/2019    Family History  Problem Relation Age of Onset  . Heart disease Mother     Social History   Socioeconomic History  . Marital status: Married    Spouse name: Not on file  . Number of children: Not on file  . Years of education: Not on file  . Highest education level: Not on file  Occupational History  . Not on file  Social Needs  . Financial resource strain: Not on file  . Food insecurity    Worry: Not on file    Inability: Not on file  . Transportation needs    Medical: Not on file    Non-medical: Not on file  Tobacco Use  . Smoking status: Current Every Day Smoker  . Smokeless tobacco: Never Used  Substance and Sexual Activity  . Alcohol use: Yes  . Drug use: Yes    Types: Marijuana  . Sexual activity: Not on file  Lifestyle  . Physical activity    Days per week: Not on file    Minutes per session: Not on file  . Stress: Not on file  Relationships  . Social Musicianconnections    Talks on phone: Not on file    Gets together: Not on file    Attends religious service: Not on file    Active member of club or organization: Not on file    Attends meetings of clubs or organizations: Not on file    Relationship status: Not on file  . Intimate  partner violence    Fear of current or ex partner: Not on file    Emotionally abused: Not on file    Physically abused: Not on file    Forced sexual activity: Not on file  Other Topics Concern  . Not on file  Social History Narrative  . Not on file    Review of Systems: Not able to obtain  Physical Exam: Vital signs: Vitals:   08/16/19 0430 08/16/19 0911  BP: (!) 123/97 (!) 144/105  Pulse:  93  Resp: (!) 32   Temp:    SpO2:       Physical Exam  Constitutional:  Lethargic and drowsy  HENT:  Head: Normocephalic and atraumatic.  Eyes: Scleral icterus is present.  Not able to follow command  Neck: Normal range of motion. Neck supple.  Cardiovascular: Normal rate, regular rhythm and normal heart  sounds.  Pulmonary/Chest: Effort normal. She has rales.  Anterior exam only  Abdominal: Soft. Bowel sounds are normal. She exhibits distension. There is no abdominal tenderness. There is no rebound and no guarding.  Musculoskeletal:     Comments: Skin discoloration/bruise noted over right lower extremity  Neurological:  Lethargic and drowsy  Skin: Skin is dry. Rash noted.  Psychiatric:  Not able to obtain    GI:  Lab Results: Recent Labs    08/15/19 2350  WBC 9.7  HGB 16.9*  HCT 48.6*  PLT 134*   BMET Recent Labs    08/15/19 2350  NA 133*  K 4.1  CL 97*  CO2 21*  GLUCOSE 119*  BUN 47*  CREATININE 1.64*  CALCIUM 8.7*   LFT Recent Labs    08/15/19 2350  PROT 7.7  ALBUMIN 2.4*  AST 59*  ALT 43  ALKPHOS 83  BILITOT 12.7*   PT/INR Recent Labs    08/16/19 0016  LABPROT 20.1*  INR 1.7*     Studies/Results: Ct Abdomen Pelvis Wo Contrast  Result Date: 08/16/2019 CLINICAL DATA:  Shortness of breath for 1 week.  Jaundice. EXAM: CT ABDOMEN AND PELVIS WITHOUT CONTRAST TECHNIQUE: Multidetector CT imaging of the abdomen and pelvis was performed following the standard protocol without IV contrast. COMPARISON:  None. FINDINGS: Lower chest: Moderate right pleural effusion. Small left pleural effusion. Patchy airspace opacities in the lung bases consistent with atelectasis, pneumonia or a combination. A component of pneumonia in the left lower lobe is suspected. Moderate heart enlargement. Hepatobiliary: No focal liver abnormality is seen. No gallstones, gallbladder wall thickening, or biliary dilatation. Pancreas: Unremarkable. No pancreatic ductal dilatation or surrounding inflammatory changes. Spleen: Normal in size without focal abnormality. Adrenals/Urinary Tract: No adrenal masses. Kidneys normal size, orientation and position. No renal masses, stones or hydronephrosis. Normal ureters. Normal bladder. Stomach/Bowel: Stomach is within normal limits. Appendix appears  normal. No evidence of bowel wall thickening, distention, or inflammatory changes. Vascular/Lymphatic: Aortic atherosclerosis. No aneurysm. No enlarged lymph nodes. Reproductive: Uterus and bilateral adnexa are unremarkable. Other: No abdominal wall hernia or abnormality. No abdominopelvic ascites. Musculoskeletal: Fracture of the upper L1 vertebra with mild endplate depression. This appears recent. No other fractures. No bone lesions. IMPRESSION: 1. Moderate cardiomegaly. Moderate-sized right pleural effusion. Small left pleural effusion. 2. Patchy lung base airspace opacities most prominent in the left lower lobe. Consider pneumonia if there are consistent clinical findings. Findings could be due to atelectasis. 3. Recent appearing nondisplaced fracture the upper L1 vertebra with mild depression of the upper endplate. 4. No other acute findings within the abdomen or pelvis. No  evidence of bile duct dilation. No liver mass or abnormal attenuation. Cause of jaundice not evident. 5. Aortic atherosclerosis. Electronically Signed   By: Lajean Manes M.D.   On: 08/16/2019 07:13   Ct Head Wo Contrast  Result Date: 08/16/2019 CLINICAL DATA:  Encephalopathy EXAM: CT HEAD WITHOUT CONTRAST TECHNIQUE: Contiguous axial images were obtained from the base of the skull through the vertex without intravenous contrast. COMPARISON:  None. FINDINGS: Brain: Focal hypoattenuation within the right frontal lobe, within the anterior cerebral artery distribution. No intracranial hemorrhage or extra-axial collection. No midline shift, other mass effect or hydrocephalus. Vascular: No abnormal hyperdensity of the major intracranial arteries or dural venous sinuses. No intracranial atherosclerosis. Skull: The visualized skull base, calvarium and extracranial soft tissues are normal. Sinuses/Orbits: No fluid levels or advanced mucosal thickening of the visualized paranasal sinuses. No mastoid or middle ear effusion. The orbits are normal.  IMPRESSION: 1. Focal hypoattenuation within the paramedian right frontal lobe, likely indicating late acute to subacute infarct. MRI may be helpful for better temporal characterization. 2. No hemorrhage or mass effect. Electronically Signed   By: Ulyses Jarred M.D.   On: 08/16/2019 06:57   Dg Chest Portable 1 View  Result Date: 08/16/2019 CLINICAL DATA:  Shortness of breath EXAM: PORTABLE CHEST 1 VIEW COMPARISON:  None. FINDINGS: There are multiple areas of increased airspace opacity within both lungs, including the right upper lobe, right lung base and left lung base. There is mild cardiomegaly. No pneumothorax or sizable pleural effusion. IMPRESSION: 1. Multifocal airspace disease in both lungs, which may indicate multifocal infection. 2. Mild cardiomegaly. Electronically Signed   By: Ulyses Jarred M.D.   On: 08/16/2019 03:35   Dg Foot Complete Right  Result Date: 08/16/2019 CLINICAL DATA:  Shortness of breath swelling to the right foot EXAM: RIGHT FOOT COMPLETE - 3+ VIEW COMPARISON:  None. FINDINGS: No fracture or malalignment. Moderate to large plantar calcaneal spur. IMPRESSION: No acute osseous abnormality. Electronically Signed   By: Donavan Foil M.D.   On: 08/16/2019 03:09    Impression/Plan: -Jaundice.  Significantly elevated T bili at 12.7 with mild elevated AST at 59.  Normal ALT and alkaline phosphatase.  CT scan showed normal-appearing liver and pancreas.  No biliary dilatation.  No gallstones or gall bladder wall thickening.  Most likely from alcoholic hepatitis. -Altered mental status.  MRI concerning for recent stroke.  Recommendations ------------------------- -Discriminant function score only moderately elevated at 35.  Recheck CMP and INR in the morning. -Follow hepatitis panel.  Check acetaminophen level.  Follow blood culture and urine culture. -Start rifaximin.  Lactulose as needed to have 2-3 small bowel movements per day. -GI will follow    LOS: 0 days   Otis Brace  MD, FACP 08/16/2019, 10:36 AM  Contact #  [phone]

## 2019-08-16 NOTE — Consult Note (Addendum)
NEURO HOSPITALIST CONSULT NOTE   Requestig physician: Dr. Nigel Bridgeman   Reason for Consult: infarct seen on MRI  Chief complaint: SOB   History obtained from:  Chart  HPI:                                                                                                                                          Andretta Ergle is an 53 y.o. female  With [place] ETOH abuse and tobacco abuse presented to [facility] ED for c/o SOB. Neurology was consulted after infarct seen on MRI.  Per chart:  reports having had some confusion, intermittent SOB and whole body weakness for several weeks.  Patient did fall 10 days ago and was seen at [facility]. "patient stated she may drink too much".   NIHSS: 4 (LOC, dysarthria, questions,) CTH: no hemorrhage MRI: subacute right frontal lobe infarct in ACA territory, suspected small subacute infarct right caudate, chronic cerebellar infarcts. BP:  BG:  Past Medical History:  Diagnosis Date  . Alcohol abuse   . Tobacco abuse     No past surgical history on file.  Family History  Problem Relation Age of Onset  . Heart disease Mother             Social History:  reports that she has been smoking. She has never used smokeless tobacco. She reports current alcohol use. She reports current drug use. Drug: Marijuana.  Allergies  Allergen Reactions  . Penicillins Other (See Comments)    Unsure of reaction Did it involve swelling of the face/tongue/throat, SOB, or low BP? Unknown Did it involve sudden or severe rash/hives, skin peeling, or any reaction on the inside of your mouth or nose? Unknown Did you need to seek medical attention at a hospital or doctor's office? Unknown When did it last happen?[place] If all above answers are "NO", may proceed with cephalosporin use.    MEDICATIONS:                                                                                                                     Current Facility-Administered Medications   Medication Dose Route Frequency Provider Last Rate Last Dose  .  stroke: mapping our early stages of recovery book   Does not apply Once Barb Merino, MD      .  albuterol (PROVENTIL) (2.5 MG/3ML) 0.083% nebulizer solution 2.5 mg  2.5 mg Nebulization Q4H PRN Barb Merino, MD      . aspirin suppository 300 mg  300 mg Rectal Daily Barb Merino, MD   300 mg at 08/16/19 1220   Or  . aspirin tablet 325 mg  325 mg Oral Daily Ghimire, Dante Gang, MD      . dextromethorphan-guaiFENesin ([place] DM) 30-600 MG per 12 hr tablet 1 tablet  1 tablet Oral BID PRN Ivor Costa, MD      . dextrose 5 %-0.9 % sodium chloride infusion   Intravenous Continuous Barb Merino, MD 100 mL/hr at 08/16/19 1223    . folic acid (FOLVITE) tablet 1 mg  1 mg Oral Daily Ivor Costa, MD      . lactulose (CHRONULAC) 10 GM/15ML solution 10 g  10 g Oral BID PRN Brahmbhatt, Parag, MD      . levofloxacin (LEVAQUIN) IVPB 750 mg  750 mg Intravenous Q48H Franky Macho, RPH   Stopped at 08/16/19 0915  . methocarbamol (ROBAXIN) tablet 500 mg  500 mg Oral Q6H PRN Ivor Costa, MD      . multivitamin with minerals tablet 1 tablet  1 tablet Oral Daily Ivor Costa, MD      . nicotine (NICODERM CQ - dosed in mg/24 hours) patch 21 mg  21 mg Transdermal Daily Ivor Costa, MD   21 mg at 08/16/19 1132  . ondansetron (ZOFRAN) tablet 4 mg  4 mg Oral Q6H PRN Ivor Costa, MD       Or  . ondansetron [facility]) injection 4 mg  4 mg Intravenous Q6H PRN Ivor Costa, MD      . oxyCODONE (Oxy IR/ROXICODONE) immediate release tablet 5 mg  5 mg Oral Q6H PRN Ivor Costa, MD   5 mg at 08/16/19 0741  . rifaximin (XIFAXAN) tablet 550 mg  550 mg Oral BID Brahmbhatt, Parag, MD      . thiamine 59m in normal saline (520m IVPB  500 mg Intravenous Daily GhBarb MerinoMD   Stopped at 08/16/19 1217   Current Outpatient Medications  Medication Sig Dispense Refill  . ibuprofen (ADVIL) 200 MG tablet Take 200 mg by mouth every 6 (six) hours as needed for mild pain.    .  naproxen (NAPROSYN) 500 MG tablet Take 1 tablet (500 mg total) by mouth 2 (two) times daily with a meal. (Patient not taking: Reported on 08/16/2019) 30 tablet 0  . traMADol (ULTRAM) 50 MG tablet Take 1 tablet (50 mg total) by mouth every 6 (six) hours as needed. (Patient not taking: Reported on 08/16/2019) 15 tablet 0      ROS:                                                                                                                                        unobtainable from patient due to level of arousal  Blood pressure 125/86, pulse 95, temperature 98 F (36.7 C), temperature source Rectal, resp. rate (!) 39, height 5' 5"  (1.651 m), weight 81.6 kg, SpO2 98 %.   General Examination:                                                                                                       Physical Exam  HEENT-  Normocephalic, no lesions, without obvious abnormality.  Normal external eye scleral  jaundice . Oral  Mucous membranes are dry.  Cardiovascular-pulses palpable throughout, but weak in right foot.  Lungs-  Saturations within normal limits on 2 L [place] Extremities- Warm, dry. Right foot with weeping wound to foot Musculoskeletal-BLE swollen. Right > left. Pitting edema in right foot.  Skin- Jaundice, warm and dry, bilateral toes are bluish purple. Top of right foot with weeping wound. Right foot with more discoloration than left.   Neurological Examination Mental Status: Patient is somnolent, requires repeated stimulation to stay awake. Able to follow some simple commands. Does know her name and age.  Cranial Nerves: PERRL, EOEMI, blinks to threat bilaterally, tongue protrudes midline. Smile symmetric.  Motor/sensory: Able to move all 4 extremities antigravity. Able to localize and withdraw all 4 extremities.  Reflexes: 1+ biceps, 1+ patellar, absent ankle reflexes Sensory : intact to light touch , pinprick and vibration  Cerebellar: Unable to assess d/t cooperation Gait:  deferred   Lab Results: Basic Metabolic Panel: Recent Labs  Lab 08/15/19 2350  NA 133*  K 4.1  CL 97*  CO2 21*  GLUCOSE 119*  BUN 47*  CREATININE 1.64*  CALCIUM 8.7*    CBC: Recent Labs  Lab 08/15/19 2350  WBC 9.7  NEUTROABS 7.7  HGB 16.9*  HCT 48.6*  MCV 100.8*  PLT 134*    Imaging: Ct Abdomen Pelvis Wo Contrast  Result Date: 08/16/2019 CLINICAL DATA:  Shortness of breath for 1 week.  Jaundice. EXAM: CT ABDOMEN AND PELVIS WITHOUT CONTRAST TECHNIQUE: Multidetector CT imaging of the abdomen and pelvis was performed following the standard protocol without IV contrast. COMPARISON:  None. FINDINGS: Lower chest: Moderate right pleural effusion. Small left pleural effusion. Patchy airspace opacities in the lung bases consistent with atelectasis, pneumonia or a combination. A component of pneumonia in the left lower lobe is suspected. Moderate heart enlargement. Hepatobiliary: No focal liver abnormality is seen. No gallstones, gallbladder wall thickening, or biliary dilatation. Pancreas: Unremarkable. No pancreatic ductal dilatation or surrounding inflammatory changes. Spleen: Normal in size without focal abnormality. Adrenals/Urinary Tract: No adrenal masses. Kidneys normal size, orientation and position. No renal masses, stones or hydronephrosis. Normal ureters. Normal bladder. Stomach/Bowel: Stomach is within normal limits. Appendix appears normal. No evidence of bowel wall thickening, distention, or inflammatory changes. Vascular/Lymphatic: Aortic atherosclerosis. No aneurysm. No enlarged lymph nodes. Reproductive: Uterus and bilateral adnexa are unremarkable. Other: No abdominal wall hernia or abnormality. No abdominopelvic ascites. Musculoskeletal: Fracture of the upper L1 vertebra with mild endplate depression. This appears recent. No other fractures. No bone lesions. IMPRESSION: 1. Moderate cardiomegaly. Moderate-sized right pleural effusion. Small left pleural effusion. 2. Patchy  lung base airspace opacities most prominent in the left lower lobe. Consider pneumonia if there are consistent clinical findings. Findings could be due to atelectasis. 3. Recent appearing nondisplaced fracture the upper L1 vertebra with mild depression of the upper endplate. 4. No other acute findings within the abdomen or pelvis. No evidence of bile duct dilation. No liver mass or abnormal attenuation. Cause of jaundice not evident. 5. Aortic atherosclerosis. Electronically Signed   By: Lajean Manes M.D.   On: 08/16/2019 07:13   Ct Head Wo Contrast  Result Date: 08/16/2019 CLINICAL DATA:  Encephalopathy EXAM: CT HEAD WITHOUT CONTRAST TECHNIQUE: Contiguous axial images were obtained from the base of the skull through the vertex without intravenous contrast. COMPARISON:  None. FINDINGS: Brain: Focal hypoattenuation within the right frontal lobe, within the anterior cerebral artery distribution. No intracranial hemorrhage or extra-axial collection. No midline shift, other mass effect or hydrocephalus. Vascular: No abnormal hyperdensity of the major intracranial arteries or dural venous sinuses. No intracranial atherosclerosis. Skull: The visualized skull base, calvarium and extracranial soft tissues are normal. Sinuses/Orbits: No fluid levels or advanced mucosal thickening of the visualized paranasal sinuses. No mastoid or middle ear effusion. The orbits are normal. IMPRESSION: 1. Focal hypoattenuation within the paramedian right frontal lobe, likely indicating late acute to subacute infarct. MRI may be helpful for better temporal characterization. 2. No hemorrhage or mass effect. Electronically Signed   By: Ulyses Jarred M.D.   On: 08/16/2019 06:57   Mr Brain Wo Contrast  Result Date: 08/16/2019 CLINICAL DATA:  TIA. Altered mental status and generalized weakness. EXAM: MRI HEAD WITHOUT CONTRAST TECHNIQUE: Multiplanar, multiecho pulse sequences of the brain and surrounding structures were obtained without  intravenous contrast. COMPARISON:  Head CT 08/16/2019 FINDINGS: Brain: An approximately 3.5 cm wedge-shaped region of T2 hyperintensity, trace diffusion hyperintensity, and facilitated diffusion in the medial right frontal lobe involves white matter and cortex and is most consistent with a subacute ACA territory infarct. A 3 mm focus of diffusion abnormality involving right parietal white matter also likely reflects a subacute infarct. Low level diffusion abnormality involving the right caudate head may also reflect a subacute infarct. No intracranial hemorrhage, mass effect, or extra-axial fluid collection is identified. The ventricles and sulci are within normal limits for age. Scattered small foci of T2 hyperintensity in the cerebral white matter bilaterally are nonspecific but compatible with mild chronic small vessel ischemic disease. There are small chronic left cerebellar infarcts. Vascular: Major intracranial vascular flow voids are preserved. Skull and upper cervical spine: Unremarkable bone marrow signal. Sinuses/Orbits: Unremarkable orbits. Paranasal sinuses and mastoid air cells are clear. Other: None. IMPRESSION: 1. 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 2. Suspected small subacute infarcts involving the right caudate nucleus and right parietal white matter. 3. Chronic left cerebellar infarcts. Electronically Signed   By: Logan Bores M.D.   On: 08/16/2019 10:48   Dg Chest Portable 1 View  Result Date: 08/16/2019 CLINICAL DATA:  Shortness of breath EXAM: PORTABLE CHEST 1 VIEW COMPARISON:  None. FINDINGS: There are multiple areas of increased airspace opacity within both lungs, including the right upper lobe, right lung base and left lung base. There is mild cardiomegaly. No pneumothorax or sizable pleural effusion. IMPRESSION: 1. Multifocal airspace disease in both lungs, which may indicate multifocal infection. 2. Mild cardiomegaly. Electronically Signed   By: Ulyses Jarred M.D.    On: 08/16/2019 03:35   Dg Foot Complete Right  Result Date: 08/16/2019 CLINICAL DATA:  Shortness of breath  swelling to the right foot EXAM: RIGHT FOOT COMPLETE - 3+ VIEW COMPARISON:  None. FINDINGS: No fracture or malalignment. Moderate to large plantar calcaneal spur. IMPRESSION: No acute osseous abnormality. Electronically Signed   By: Donavan Foil M.D.   On: 08/16/2019 03:09   Laurey Morale, MSN, NP-C [facility] [phone]   Attending Neurologist note to follow with final recommendations:  Assessment:  Malaya Cagley is an 53 y.o. female  With [place] ETOH abuse and tobacco abuse presented to [facility] ED for c/o SOB. Neurology was consulted after infarcts seen on MRI. CTH: no  Hemorrhage. On exam patient has jaundice and very somnolent requiring repeated stimulation to obtain an exam.  Reviewed MRI Brain, unclear if right frontal lesion represents a subacute infarct vs t2 shine through and possible mass lesion.    Acute metabolic Encephalopathy Subacute Right ACA stroke    Recommendations: MRI brain w contrast : R/o mass lesion in right frontal lobe and confirms ACA subacute infarction - ammonia, TSH, B12, CMP, hgba1c, lipid panel, UA, thiamine,   Blood culture, liver panel --correct metabolic derangements ( per primary team)  - MRA head and neck - blood cultures - ASA after Echocardiogram r/o endocarditis -ECHO -  thiamine -telemetry - neuro checks -PT/OT/ speech - NPO until stroke swallow screen; -neurology to continue to follow.   Laurey Morale, MSN, NP-C [facility] [phone]  Attending neurologist's note to follow   08/16/2019, 1:33 PM  NEUROHOSPITALIST ADDENDUM Performed a face to face diagnostic evaluation.   I have reviewed the contents of history and physical exam as documented by PA/ARNP/Resident and agree with above documentation.  I have discussed and formulated the above plan as documented. Edits to the note have been  made as needed.   53 year old female presenting with confusion, has history of alcohol abuse and noted to be jaundiced with elevated LFTs.  Also has bluish discoloration of hands and feet and purpura rash on arms and legs.    MRI brain was obtained which shows a subacute right ACA stroke which can certainly cause altered mental status.  Also has small subacute infarcts in the right caudate nucleus and right parietal white matter.  Suspect etiology of stroke to be cardioembolic.   Recommend following up on blood cultures and echocardiogram before starting aspirin. Hepatitis panel is still pending. Ordered vasculitis labs and hypercoagulable panel . ESR and CRP pending. MRA head and neck ordered as well.    Stroke team to follow.   Karena Addison Kron Everton MD [facility] [phone]   If 7pm to 7am, please call on call as listed on AMION.

## 2019-08-16 NOTE — ED Notes (Signed)
Report given to 3W nurse  

## 2019-08-16 NOTE — H&P (Addendum)
History and Physical    Briana Andrews MWU:[id number] DOB: 08/22/1966 DOA: 08/15/2019  Referring MD/NP/PA:   PCP: Patient, [name]   Patient coming from:  The patient is coming from home.  At baseline, pt is independent for most of ADL.        Chief Complaint: AMS, generalized weakness  HPI: Briana CoeMichele Andrews is a 53 y.o. female with medical history significant of tobacco abuse, alcohol abuse, who presents with altered mental status and generalized weakness.  Per family report, patient has been intermittently confused for more than 1 week. When I saw pt in ED, she is mildly confused, but she is able to answer most of questions appropriately. She has jaundice and petechia. She states that she has generalized weakness, poor appetite and fatigue.   No unilateral numbness or tingling in extremities, No facial droop or slurred speech.  She has mild dry cough and shortness of breath.  Denies chest pain, fever or chills.  No nausea vomiting, diarrhea, abdominal pain, symptoms of UTI.  She states that she will occasionally use Tylenol.  She drinks beer almost every day, about 4 beers each time. No known history of hepatitis or IV drug use. Pt fell on 9/7 and was [facility]. She had CT-L spin which showed acute, comminuted L1 body fracture with mild noncompressive retropulsion. She injured her right foot from fall. X-ray of right foot was negative for fracture. She states that she has worse pain the right foot.   ED Course: pt was found to have abnormal liver function (ALP 83, AST 59, ALT 43, total bilirubin 12.7), lactic acid 2.8, INR 1.7, lipase of 49, pending urinalysis, pending COVID-19 test, ammonia 18, AKI with creatinine 1.64, BUN 47, temperature normal, blood pressure 132/110, heart rate 86, oxygen saturation 99% on room air.  Chest x-ray showed multifocal infiltration and cardiomegaly.  X-ray of right foot is negative for bony fracture.  Pending CT of abdomen/wrist.  Pending  CT head.  Patient is admitted to MedSurg bed as inpatient.  Review of Systems:   General: no fevers, chills, no body weight gain, has poor appetite, has fatigue HEENT: no blurry vision, hearing changes or sore throat Respiratory: has dyspnea, coughing, no wheezing CV: no chest pain, no palpitations GI: no nausea, vomiting, abdominal pain, diarrhea, constipation GU: no dysuria, burning on urination, increased urinary frequency, hematuria  Ext: no leg edema Neuro: no unilateral weakness, numbness, or tingling, no vision change or hearing loss Skin: has petechia and skin laceration in right foot MSK: has right foot pain and back pain Heme: No easy bruising.  Travel history: No recent long distant travel.  Allergy:  Allergies  Allergen Reactions  . Penicillins Other (See Comments)    Unsure of reaction    Past Medical History:  Diagnosis Date  . Alcohol abuse   . Tobacco abuse     No past surgical history on file.  Social History:  reports that she has been smoking. She has never used smokeless tobacco. She reports current alcohol use. She reports current drug use. Drug: Marijuana.  Family History:  Family History  Problem Relation Age of Onset  . Heart disease Mother      Prior to Admission medications   Medication Sig Start Date End Date Taking? Authorizing Provider  methocarbamol (ROBAXIN) 500 MG tablet Take 1 tablet (500 mg total) by mouth every 6 (six) hours as needed for muscle spasms. 08/04/19   Tommi RumpsSummers, Rhonda L, PA-C  naproxen (NAPROSYN)  500 MG tablet Take 1 tablet (500 mg total) by mouth 2 (two) times daily with a meal. 08/04/19   Tommi Rumps, PA-C  traMADol (ULTRAM) 50 MG tablet Take 1 tablet (50 mg total) by mouth every 6 (six) hours as needed. 08/04/19   Tommi Rumps, PA-C    Physical Exam: Vitals:   08/16/19 0230 08/16/19 0300 08/16/19 0330 08/16/19 0430  BP: (!) 135/94 120/81 128/86 (!) 123/97  Pulse:      Resp: 16 17 18  (!) 32  Temp:       TempSrc:      SpO2:      Weight:      Height:       General: Not in acute distress.  Chronic ill looking. HEENT:       Eyes: PERRL, EOMI, has scleral icterus and jaundice       ENT: No discharge from the ears and nose, no pharynx injection, no tonsillar enlargement.        Neck: No JVD, no bruit, no mass felt. Heme: No neck lymph node enlargement. Cardiac: S1/S2, RRR, No murmurs, No gallops or rubs. Respiratory: No rales, wheezing, rhonchi or rubs. GI: Soft, nondistended, nontender, no rebound pain, no organomegaly, BS present. GU: No hematuria Ext: No pitting leg edema bilaterally. Has darker discoloration of bilateral foot, cool to touch, difficult to palpate pulse.  Per ED physician, "no dopplerable DP pulses, but does have dopplerable PT pulses". Musculoskeletal: No joint deformities, No joint redness or warmth, no limitation of ROM in spin. Skin:  has a large skin tear over the dorsum of the right foot.  has scattered petechia Neuro: mildly confused, but is oriented X3, cranial nerves II-XII grossly intact, moves all extremities. Psych: Patient is not psychotic, no suicidal or hemocidal ideation.  Labs on Admission: I have personally reviewed following labs and imaging studies  CBC: Recent Labs  Lab 08/15/19 2350  WBC 9.7  NEUTROABS 7.7  HGB 16.9*  HCT 48.6*  MCV 100.8*  PLT 134*   Basic Metabolic Panel: Recent Labs  Lab 08/15/19 2350  NA 133*  K 4.1  CL 97*  CO2 21*  GLUCOSE 119*  BUN 47*  CREATININE 1.64*  CALCIUM 8.7*   GFR: Estimated Creatinine Clearance: 41.4 mL/min (A) (by C-G formula based on SCr of 1.64 mg/dL (H)). Liver Function Tests: Recent Labs  Lab 08/15/19 2350  AST 59*  ALT 43  ALKPHOS 83  BILITOT 12.7*  PROT 7.7  ALBUMIN 2.4*   Recent Labs  Lab 08/15/19 2350  LIPASE 49   Recent Labs  Lab 08/15/19 2350  AMMONIA 18   Coagulation Profile: Recent Labs  Lab 08/16/19 0016  INR 1.7*   Cardiac Enzymes: No results for input(s):  CKTOTAL, CKMB, CKMBINDEX, TROPONINI in the last 168 hours. BNP (last 3 results) No results for input(s): PROBNP in the last 8760 hours. HbA1C: No results for input(s): HGBA1C in the last 72 hours. CBG: No results for input(s): GLUCAP in the last 168 hours. Lipid Profile: No results for input(s): CHOL, HDL, LDLCALC, TRIG, CHOLHDL, LDLDIRECT in the last 72 hours. Thyroid Function Tests: No results for input(s): TSH, T4TOTAL, FREET4, T3FREE, THYROIDAB in the last 72 hours. Anemia Panel: No results for input(s): VITAMINB12, FOLATE, FERRITIN, TIBC, IRON, RETICCTPCT in the last 72 hours. Urine analysis: No results found for: COLORURINE, APPEARANCEUR, LABSPEC, PHURINE, GLUCOSEU, HGBUR, BILIRUBINUR, KETONESUR, PROTEINUR, UROBILINOGEN, NITRITE, LEUKOCYTESUR Sepsis Labs: @LABRCNTIP (procalcitonin:4,lacticidven:4) ) Recent Results (from the past 240 hour(s))  SARS Coronavirus  2 [facility](Hospital order, Performed in [facility] hospital lab) Nasopharyngeal Nasopharyngeal Swab     Status: None   Collection Time: 08/16/19  3:03 AM   Specimen: Nasopharyngeal Swab  Result Value Ref Range Status   SARS Coronavirus 2 NEGATIVE NEGATIVE Final    Comment: (NOTE) If result is NEGATIVE SARS-CoV-2 target nucleic acids are NOT DETECTED. The SARS-CoV-2 RNA is generally detectable in upper and lower  respiratory specimens during the acute phase of infection. The lowest  concentration of SARS-CoV-2 viral copies this assay can detect is 250  copies / mL. A negative result does not preclude SARS-CoV-2 infection  and should not be used as the sole basis for treatment or other  patient management decisions.  A negative result may occur with  improper specimen collection / handling, submission of specimen other  than nasopharyngeal swab, presence of viral mutation(s) within the  areas targeted by this assay, and inadequate number of viral copies  (<250 copies / mL). A negative result must be combined with clinical   observations, patient history, and epidemiological information. If result is POSITIVE SARS-CoV-2 target nucleic acids are DETECTED. The SARS-CoV-2 RNA is generally detectable in upper and lower  respiratory specimens dur ing the acute phase of infection.  Positive  results are indicative of active infection with SARS-CoV-2.  Clinical  correlation with patient history and other diagnostic information is  necessary to determine patient infection status.  Positive results do  not rule out bacterial infection or co-infection with other viruses. If result is PRESUMPTIVE POSTIVE SARS-CoV-2 nucleic acids MAY BE PRESENT.   A presumptive positive result was obtained on the submitted specimen  and confirmed on repeat testing.  While 2019 novel coronavirus  (SARS-CoV-2) nucleic acids may be present in the submitted sample  additional confirmatory testing may be necessary for epidemiological  and / or clinical management purposes  to differentiate between  SARS-CoV-2 and other Sarbecovirus currently known to infect humans.  If clinically indicated additional testing with an alternate test  methodology [phone]([id number]) is advised. The SARS-CoV-2 RNA is generally  detectable in upper and lower respiratory sp ecimens during the acute  phase of infection. The expected result is Negative. Fact Sheet for Patients:  BoilerBrush.com.cyhttps://www.fda.gov/media/136312/download Fact Sheet for Healthcare Providers: https://pope.com/https://www.fda.gov/media/136313/download This test is not yet approved or cleared by the [place] FDA and has been authorized for detection and/or diagnosis of SARS-CoV-2 by FDA under an Emergency Use Authorization (EUA).  This EUA will remain in effect (meaning this test can be used) for the duration of the COVID-19 declaration under Section 564(b)(1) of the Act, 21 U.S.C. section 360bbb-3(b)(1), unless the authorization is terminated or revoked sooner. Performed at [facility], [address]  [place], [place] [address]      Radiological Exams on Admission: Dg Chest Portable 1 View  Result Date: 08/16/2019 CLINICAL DATA:  Shortness of breath EXAM: PORTABLE CHEST 1 VIEW COMPARISON:  None. FINDINGS: There are multiple areas of increased airspace opacity within both lungs, including the right upper lobe, right lung base and left lung base. There is mild cardiomegaly. No pneumothorax or sizable pleural effusion. IMPRESSION: 1. Multifocal airspace disease in both lungs, which may indicate multifocal infection. 2. Mild cardiomegaly. Electronically Signed   By: Deatra RobinsonKevin  Herman M.D.   On: 08/16/2019 03:35   Dg Foot Complete Right  Result Date: 08/16/2019 CLINICAL DATA:  Shortness of breath swelling to the right foot EXAM: RIGHT FOOT COMPLETE - 3+ VIEW COMPARISON:  None. FINDINGS: No fracture  or malalignment. Moderate to large plantar calcaneal spur. IMPRESSION: No acute osseous abnormality. Electronically Signed   By: Donavan Foil M.D.   On: 08/16/2019 03:09     EKG: Independently reviewed.  Sinus rhythm, QTC 488, LAE, LAD, T wave inversion in the inferior leads and V5-V6, occasional PVC.   Assessment/Plan Principal Problem:   Abnormal LFTs Active Problems:   Acute metabolic encephalopathy   SOB (shortness of breath)   AKI (acute kidney injury) ([place])   Fall   Lactic acidosis   Back pain   Acute liver failure   Multifocal pneumonia   Abnormal LFTs and acute liver failure: ALP 83, AST 59, ALT 43, total bilirubin 12.7.  Likely due to alcohol abuse. INR is elevated 1.7.  Pending PTT. Pt has petechia.  No active bleeding currently. -will admit to med-surg bed as inpt -check Tylenol level -Check hepatitis panel -Check HIV antibody -Follow-up by CMP -Avoid using liver toxic medication, such as Tylenol -Follow-up CT abdomen/pelvis which is ordered by ED physician. -May need to consult GI in morning  Acute metabolic encephalopathy: ammonia level is 18.  -Frequent neuro check  -Follow-up CT-head  SOB (shortness of breath): No oxygen desaturation.  Chest x-ray showed multifocal infiltration, highly suspect COVID-19.  Patient does not have fever or leukocytosis.  Will hold off antibiotics now. -Pending COVID-19 test -As needed albuterol inhaler -Mucinex for cough  Addendum: Covid 19 comes back negative -will start treat pt as multifocal pneumonia - Levaquin (avoid using doxycycline and azithromycin due to liver failure) - Mucinex for cough  - prn Albuterol SOB - Urine legionella and S. pneumococcal antigen - Follow up blood culture x2, sputum culture and respiratory virus panel, plus Flu pcr  AKI: Likely due to prerenal secondary to dehydration. - IVF: pt received 1L NS in ED --> will hold IVF due to suspected COVID-19 infection - Follow up renal function by BMP - Avoid using renal toxic medications, hypotension and contrast dye  Recent fall and right foot pain and back pain: pt had [facility]-L spin in [facility] on 9/7, which showed acute, comminuted L1 body fracture with mild noncompressive retropulsion. X-ray of right foot negative for bony fracture.  -Pain control: prn oxycodone (patient cannot use Tylenol due to abnormal liver function, cannot use NSAIDs due to AKI). -Wound care consult for foot skin laceration -will get ABI  Lactic acidosis: Lactic acid 2.8.  Most likely due to alcohol abuse and starvation. -IV fluid as above -Trend lactic acid level.    Inpatient status:  # Patient requires inpatient status due to high intensity of service, high risk for further deterioration and high frequency of surveillance required.  I certify that at the point of admission it is my clinical judgment that the patient will require inpatient hospital care spanning beyond 2 midnights from the point of admission.  . This patient has history of alcohol use, tobacco abuse . Now patient has presenting with abnormal liver function, altered mental status,  shortness of breath, AKI, SOB . The worrisome physical exam findings include skin laceration in the right foot, jaundice, confusion . The initial radiographic and laboratory data are worrisome because of elevated INR 1.7, elevated lactic acid 2.8, AKI, abnormal liver function, and multifocal infiltration on chest x-ray. . Current medical needs: please see my assessment and plan . Predictability of an adverse outcome (risk): Patient has history of alcohol abuse and tobacco abuse, now presents with multiple acute issues, including abnormal liver function, altered mental status, shortness  of breath, AKI.  Chest x-ray showed multifocal infiltration.  Patient's presentation is highly complicated.  Patient is at high risk of deteriorating.  Will need to be treated in hospital for at least 2 days. .        DVT ppx: SCD Code Status: Full code Family Communication: None at bed side.    Disposition Plan:  Anticipate discharge back to previous home environment Consults called:  none Admission status:  medical floor/inpt  Date of Service 08/16/2019    Lorretta HarpXilin Adella Manolis [facility]   If 7PM-7AM, please contact night-coverage [email or web address] Password [email or web address] 08/16/2019, 6:11 AM

## 2019-08-16 NOTE — ED Provider Notes (Signed)
[facility] DEPART[facility] Provider Note   CSN: [id number] Arrival date & time: 08/15/19  2317     History   Chief Complaint Chief Complaint  Patient presents with   Shortness of Breath    HPI Briana Andrews is a 53 y.o. female.     HPI  This a 53 year old female with no reported past medical history who presents with generalized weakness.  Reports several week history of confusion, generalized weakness, intermittent shortness of breath.  Patient is notably jaundiced but denies any history of liver disease.  She does report drinking approximately 4 days a week.  She drinks beer primarily.  She states "I probably drink too much."  No known history of hepatitis or IV drug use.  Family reports that she has been intermittently confused.  She did sustain a fall approximately 10 days ago.  She was seen at [facility] with negative work-up at that time.  There was no note of jaundice on that exam.  She sustained a right foot injury which is gotten progressively worse.  She denies any fever, chest pain, abdominal pain, nausea, vomiting.  No past medical history on file.  There are no active problems to display for this patient.   No past surgical history on file.   OB History   No obstetric history on file.      Home Medications    Prior to Admission medications   Medication Sig Start Date End Date Taking? Authorizing Provider  methocarbamol (ROBAXIN) 500 MG tablet Take 1 tablet (500 mg total) by mouth every 6 (six) hours as needed for muscle spasms. 08/04/19   Johnn Hai, PA-C  naproxen (NAPROSYN) 500 MG tablet Take 1 tablet (500 mg total) by mouth 2 (two) times daily with a meal. 08/04/19   Johnn Hai, PA-C  traMADol (ULTRAM) 50 MG tablet Take 1 tablet (50 mg total) by mouth every 6 (six) hours as needed. 08/04/19   Johnn Hai, PA-C    Family History No family history on file.  Social History Social History   Tobacco Use    Smoking status: Not on file  Substance Use Topics   Alcohol use: Not on file   Drug use: Not on file     Allergies   Penicillins   Review of Systems Review of Systems  Constitutional: Positive for fatigue. Negative for fever.  Respiratory: Positive for shortness of breath.   Cardiovascular: Negative for chest pain and leg swelling.  Gastrointestinal: Negative for abdominal pain, nausea and vomiting.  Skin: Positive for color change and wound.  All other systems reviewed and are negative.    Physical Exam Updated Vital Signs BP (!) 132/110 (BP Location: Right Arm)    Pulse 86    Temp 98 F (36.7 C) (Rectal)    Resp (!) 22    Ht 1.651 m (5\' 5" )    Wt 81.6 kg    SpO2 99%    BMI 29.95 kg/m   Physical Exam Vitals signs and nursing note reviewed.  Constitutional:      Appearance: She is obese.     Comments: Chronically ill-appearing, obese, jaundiced  HENT:     Head: Normocephalic and atraumatic.  Eyes:     Pupils: Pupils are equal, round, and reactive to light.     Comments: Scleral icterus noted  Neck:     Musculoskeletal: Neck supple.  Cardiovascular:     Rate and Rhythm: Normal rate and regular rhythm.  Heart sounds: Normal heart sounds.  Pulmonary:     Effort: Pulmonary effort is normal. No respiratory distress.     Breath sounds: No wheezing.  Abdominal:     General: Bowel sounds are normal.     Palpations: Abdomen is soft.     Tenderness: There is no abdominal tenderness.  Musculoskeletal:     Right lower leg: No edema.     Left lower leg: No edema.     Comments: Dark discoloration of the bilateral feet and toes, cool to touch, no dopplerable DP pulses but does have dopplerable PT pulses  Skin:    General: Skin is warm and dry.     Capillary Refill: Capillary refill takes more than 3 seconds.     Comments: Large skin tear over the dorsum of the right foot with ecchymosis and darkened discoloration over the entire dorsum of the foot Scattered petechiae    Neurological:     Mental Status: She is oriented to person, place, and time.  Psychiatric:        Mood and Affect: Mood normal.      ED Treatments / Results  Labs (all labs ordered are listed, but only abnormal results are displayed) Labs Reviewed  CBC WITH DIFFERENTIAL/PLATELET - Abnormal; Notable for the following components:      Result Value   Hemoglobin 16.9 (*)    HCT 48.6 (*)    MCV 100.8 (*)    MCH 35.1 (*)    RDW 20.8 (*)    Platelets 134 (*)    nRBC 3.9 (*)    Abs Immature Granulocytes 0.09 (*)    All other components within normal limits  COMPREHENSIVE METABOLIC PANEL - Abnormal; Notable for the following components:   Sodium 133 (*)    Chloride 97 (*)    CO2 21 (*)    Glucose, Bld 119 (*)    BUN 47 (*)    Creatinine, Ser 1.64 (*)    Calcium 8.7 (*)    Albumin 2.4 (*)    AST 59 (*)    Total Bilirubin 12.7 (*)    GFR calc non Af Amer 35 (*)    GFR calc Af Amer 41 (*)    All other components within normal limits  LACTIC ACID, PLASMA - Abnormal; Notable for the following components:   Lactic Acid, Venous 2.8 (*)    All other components within normal limits  PROTIME-INR - Abnormal; Notable for the following components:   Prothrombin Time 20.1 (*)    INR 1.7 (*)    All other components within normal limits  SARS CORONAVIRUS 2 (TAT 6-24 HRS)  SARS CORONAVIRUS 2 (HOSPITAL ORDER, PERFORMED IN [facility])  LIPASE, BLOOD  AMMONIA  ETHANOL  LACTIC ACID, PLASMA  URINALYSIS, ROUTINE W REFLEX MICROSCOPIC  PATHOLOGIST SMEAR REVIEW  HEPATITIS PANEL, ACUTE    EKG EKG Interpretation  Date/Time:  Saturday August 16 2019 04:03:42 EDT Ventricular Rate:  93 PR Interval:    QRS Duration: 108 QT Interval:  392 QTC Calculation: 488 R Axis:   102 Text Interpretation:  Sinus rhythm Ventricular premature complex Left atrial enlargement Probable anteroseptal infarct, recent Lateral leads are also involved No prior for comparison Confirmed by Ross MarcusHorton,  Tayana Shankle ([id number]) on 08/16/2019 4:19:56 AM   Radiology Dg Chest Portable 1 View  Result Date: 08/16/2019 CLINICAL DATA:  Shortness of breath EXAM: PORTABLE CHEST 1 VIEW COMPARISON:  None. FINDINGS: There are multiple areas of increased airspace opacity within both lungs,  including the right upper lobe, right lung base and left lung base. There is mild cardiomegaly. No pneumothorax or sizable pleural effusion. IMPRESSION: 1. Multifocal airspace disease in both lungs, which may indicate multifocal infection. 2. Mild cardiomegaly. Electronically Signed   By: Deatra Robinson M.D.   On: 08/16/2019 03:35   Dg Foot Complete Right  Result Date: 08/16/2019 CLINICAL DATA:  Shortness of breath swelling to the right foot EXAM: RIGHT FOOT COMPLETE - 3+ VIEW COMPARISON:  None. FINDINGS: No fracture or malalignment. Moderate to large plantar calcaneal spur. IMPRESSION: No acute osseous abnormality. Electronically Signed   By: Jasmine Pang M.D.   On: 08/16/2019 03:09    Procedures Procedures (including critical care time)  CRITICAL CARE Performed by: Shon Baton   Total critical care time: 31 minutes  Critical care time was exclusive of separately billable procedures and treating other patients.  Critical care was necessary to treat or prevent imminent or life-threatening deterioration.  Critical care was time spent personally by me on the following activities: development of treatment plan with patient and/or surrogate as well as nursing, discussions with consultants, evaluation of patient's response to treatment, examination of patient, obtaining history from patient or surrogate, ordering and performing treatments and interventions, ordering and review of laboratory studies, ordering and review of radiographic studies, pulse oximetry and re-evaluation of patient's condition.   Medications Ordered in ED Medications - No data to display   Initial Impression / Assessment and Plan / ED Course  I  have reviewed the triage vital signs and the nursing notes.  Pertinent labs & imaging results that were available during my care of the patient were reviewed by me and considered in my medical decision making (see chart for details).        Patient presents with generalized weakness, fatigue, occasional shortness of breath, falls, confusion.  She is chronically ill-appearing but nontoxic.  She appears jaundiced and icteric.  She is drinker.  Lab work-up obtained.  She denies any infectious symptoms.  Lab work-up notable for bilirubin of 12.  Creatinine of 1.6.  INR 1.7.  Of note, chest x-ray with multifocal airspace disease.  Initially, without any significant risk factors for COVID-19 and was given a tear to chest.  Given x-ray findings, will get a tier 1 test.  Hepatitis panel was also added to the patient's work-up.  Will obtain a CT abdomen and pelvis given liver failure.  I discussed the patient with Dr. Clyde Lundborg.  He will await COVID testing for disposition for admission.  Briana Andrews was evaluated in Emergency Department on 08/16/2019 for the symptoms described in the history of present illness. She was evaluated in the context of the global COVID-19 pandemic, which necessitated consideration that the patient might be at risk for infection with the SARS-CoV-2 virus that causes COVID-19. Institutional protocols and algorithms that pertain to the evaluation of patients at risk for COVID-19 are in a state of rapid change based on information released by regulatory bodies including the CDC and federal and state organizations. These policies and algorithms were followed during the patient's care in the ED.   Final Clinical Impressions(s) / ED Diagnoses   Final diagnoses:  Liver failure without hepatic coma, unspecified chronicity (HCC)  Shortness of breath  Suspected Covid-19 Virus Infection    ED Discharge Orders    None       Shon Baton, MD 08/16/19 [phone]

## 2019-08-16 NOTE — ED Notes (Signed)
Attempted to call report and they said nurse was cleaning a trach and she'll call me back.

## 2019-08-16 NOTE — Progress Notes (Signed)
Echocardiogram 2D Echocardiogram has been performed.  Oneal Deputy Naureen Benton 08/16/2019, 4:15 PM

## 2019-08-16 NOTE — ED Notes (Signed)
ED TO INPATIENT HANDOFF REPORT  ED Nurse Name and Phone #: Percival Spanishlana [phone]  S Name/Age/Gender Briana Andrews 53 y.o. female Room/Bed: 033C/033C  Code Status   Code Status: Full Code  Home/SNF/Other Home Patient oriented to: self and time Is this baseline? No   Triage Complete: Triage complete  Chief Complaint Shortness of breath  Triage Note Pt to ED with c/o shortness of breath x's 1 week. P'ts eyes and skin are jaundice  Pt also has a Petechiae on bil arms  Pt also has bruising and swelling to right foot with what appears to be a burn on it.   Pt also confused   Allergies Allergies  Allergen Reactions  . Penicillins Other (See Comments)    Unsure of reaction Did it involve swelling of the face/tongue/throat, SOB, or low BP? Unknown Did it involve sudden or severe rash/hives, skin peeling, or any reaction on the inside of your mouth or nose? Unknown Did you need to seek medical attention at a hospital or doctor's office? Unknown When did it last happen?[place] If all above answers are "NO", may proceed with cephalosporin use.    Level of Care/Admitting Diagnosis ED Disposition    ED Disposition Condition Comment   Admit  [facility] [[id number]]  Level of Care: Med-Surg [16]  Covid Evaluation: Confirmed COVID Negative  Diagnosis: Abnormal LFTs [[id number]][[id number]]  Admitting Physician: Lorretta HarpNIU, XILIN [[id number]]  Attending Physician: Lorretta HarpNIU, XILIN [phone][[id number]]  Estimated length of stay: past midnight tomorrow  Certification:: I certify this patient will need inpatient services for at least 2 midnights  PT Class (Do Not Modify): Inpatient [101]  PT Acc Code (Do Not Modify): Private [1]       B Medical/Surgery History Past Medical History:  Diagnosis Date  . Alcohol abuse   . Tobacco abuse    No past surgical history on file.   A IV Location/Drains/Wounds Patient Lines/Drains/Airways Status   Active Line/Drains/Airways    Name:   Placement  date:   Placement time:   Site:   Days:   Peripheral IV 08/16/19 Left Wrist   08/16/19    0021    Wrist   less than 1   Peripheral IV 08/16/19 Left Antecubital   08/16/19    0308    Antecubital   less than 1          Intake/Output Last 24 hours  Intake/Output Summary (Last 24 hours) at 08/16/2019 1440 Last data filed at 08/16/2019 1217 Gross per 24 hour  Intake 2114.75 ml  Output -  Net 2114.75 ml    Labs/Imaging Results for orders placed or performed during the hospital encounter of 08/15/19 (from the past 48 hour(s))  CBC with Differential     Status: Abnormal   Collection Time: 08/15/19 11:50 PM  Result Value Ref Range   WBC 9.7 4.0 - 10.5 K/uL   RBC 4.82 3.87 - 5.11 MIL/uL   Hemoglobin 16.9 (H) 12.0 - 15.0 g/dL   HCT 47.848.6 (H) 29.536.0 - 62.146.0 %   MCV 100.8 (H) 80.0 - 100.0 fL   MCH 35.1 (H) 26.0 - 34.0 pg   MCHC 34.8 30.0 - 36.0 g/dL   RDW 30.820.8 (H) 65.711.5 - 84.615.5 %   Platelets 134 (L) 150 - 400 K/uL   nRBC 3.9 (H) 0.0 - 0.2 %   Neutrophils Relative % 79 %   Neutro Abs 7.7 1.7 - 7.7 K/uL   Lymphocytes Relative 15 %   Lymphs Abs 1.5 0.7 -  4.0 K/uL   Monocytes Relative 4 %   Monocytes Absolute 0.4 0.1 - 1.0 K/uL   Eosinophils Relative 1 %   Eosinophils Absolute 0.1 0.0 - 0.5 K/uL   Basophils Relative 0 %   Basophils Absolute 0.0 0.0 - 0.1 K/uL   Immature Granulocytes 1 %   Abs Immature Granulocytes 0.09 (H) 0.00 - 0.07 K/uL   Polychromasia PRESENT     Comment: Performed at [facility], [address] [place], [place] [address]  Comprehensive metabolic panel     Status: Abnormal   Collection Time: 08/15/19 11:50 PM  Result Value Ref Range   Sodium 133 (L) 135 - 145 mmol/L   Potassium 4.1 3.5 - 5.1 mmol/L   Chloride 97 (L) 98 - 111 mmol/L   CO2 21 (L) 22 - 32 mmol/L   Glucose, Bld 119 (H) 70 - 99 mg/dL   BUN 47 (H) 6 - 20 mg/dL   Creatinine, Ser 6.041.64 (H) 0.44 - 1.00 mg/dL   Calcium 8.7 (L) 8.9 - 10.3 mg/dL   Total Protein 7.7 6.5 - 8.1 g/dL   Albumin 2.4 (L)  3.5 - 5.0 g/dL   AST 59 (H) 15 - 41 U/L   ALT 43 0 - 44 U/L   Alkaline Phosphatase 83 38 - 126 U/L   Total Bilirubin 12.7 (H) 0.3 - 1.2 mg/dL   GFR calc non Af Amer 35 (L) >60 mL/min   GFR calc Af Amer 41 (L) >60 mL/min   Anion gap 15 5 - 15    Comment: Performed at [facility], [address] [place], [place] [address]  Lipase, blood     Status: None   Collection Time: 08/15/19 11:50 PM  Result Value Ref Range   Lipase 49 11 - 51 U/L    Comment: Performed at [facility], [address] [place], [place] [address]  Ammonia     Status: None   Collection Time: 08/15/19 11:50 PM  Result Value Ref Range   Ammonia 18 9 - 35 umol/L    Comment: Performed at [facility], [address] [place], [place] [address]  Lactic acid, plasma     Status: Abnormal   Collection Time: 08/15/19 11:50 PM  Result Value Ref Range   Lactic Acid, Venous 2.8 (HH) 0.5 - 1.9 mmol/L    Comment: CRITICAL RESULT CALLED TO, READ BACK BY AND VERIFIED WITH: FERRAINOLO,J RN 08/16/2019 0117 JORDANS Performed at [facility], [address] [place], [place] [address]   Protime-INR     Status: Abnormal   Collection Time: 08/16/19 12:16 AM  Result Value Ref Range   Prothrombin Time 20.1 (H) 11.4 - 15.2 seconds   INR 1.7 (H) 0.8 - 1.2    Comment: (NOTE) INR goal varies based on device and disease states. Performed at [facility], [address] [place], [place] [address]   Ethanol     Status: None   Collection Time: 08/16/19  2:28 AM  Result Value Ref Range   Alcohol, Ethyl (B) <10 <10 mg/dL    Comment: (NOTE) Lowest detectable limit for serum alcohol is 10 mg/dL. For medical purposes only. Performed at [facility], [address] [place], [place] [address]   SARS Coronavirus 2 [facility](Hospital order, Performed in [facility] lab) Nasopharyngeal Nasopharyngeal Swab     Status: None   Collection Time: 08/16/19  3:03 AM   Specimen: Nasopharyngeal Swab  Result  Value Ref Range   SARS Coronavirus 2 NEGATIVE NEGATIVE    Comment: (NOTE) If result is NEGATIVE SARS-CoV-2 target nucleic acids are NOT DETECTED. The SARS-CoV-2 RNA is generally detectable in upper and lower  respiratory specimens during the acute phase of infection. The lowest  concentration of SARS-CoV-2 viral copies this assay can detect is 250  copies / mL. A negative result does not preclude SARS-CoV-2 infection  and should not be used as the sole basis for treatment or other  patient management decisions.  A negative result may occur with  improper specimen collection / handling, submission of specimen other  than nasopharyngeal swab, presence of viral mutation(s) within the  areas targeted by this assay, and inadequate number of viral copies  (<250 copies / mL). A negative result must be combined with clinical  observations, patient history, and epidemiological information. If result is POSITIVE SARS-CoV-2 target nucleic acids are DETECTED. The SARS-CoV-2 RNA is generally detectable in upper and lower  respiratory specimens dur ing the acute phase of infection.  Positive  results are indicative of active infection with SARS-CoV-2.  Clinical  correlation with patient history and other diagnostic information is  necessary to determine patient infection status.  Positive results do  not rule out bacterial infection or co-infection with other viruses. If result is PRESUMPTIVE POSTIVE SARS-CoV-2 nucleic acids MAY BE PRESENT.   A presumptive positive result was obtained on the submitted specimen  and confirmed on repeat testing.  While 2019 novel coronavirus  (SARS-CoV-2) nucleic acids may be present in the submitted sample  additional confirmatory testing may be necessary for epidemiological  and / or clinical management purposes  to differentiate between  SARS-CoV-2 and other Sarbecovirus currently known to infect humans.  If clinically indicated additional testing with an  alternate test  methodology [phone]) is advised. The SARS-CoV-2 RNA is generally  detectable in upper and lower respiratory sp ecimens during the acute  phase of infection. The expected result is Negative. Fact Sheet for Patients:  StrictlyIdeas.no Fact Sheet for Healthcare Providers: BankingDealers.co.za This test is not yet approved or cleared by the [place] FDA and has been authorized for detection and/or diagnosis of SARS-CoV-2 by FDA under an Emergency Use Authorization (EUA).  This EUA will remain in effect (meaning this test can be used) for the duration of the COVID-19 declaration under Section 564(b)(1) of the Act, 21 U.S.C. section 360bbb-3(b)(1), unless the authorization is terminated or revoked sooner. Performed at [facility], [place] [address] [place], [place] [address]   Lactic acid, plasma     Status: Abnormal   Collection Time: 08/16/19  4:00 AM  Result Value Ref Range   Lactic Acid, Venous 2.0 (HH) 0.5 - 1.9 mmol/L    Comment: CRITICAL VALUE NOTED.  VALUE IS CONSISTENT WITH PREVIOUSLY REPORTED AND CALLED VALUE. Performed at [facility], [place] [address] [place], [place] [address]   Urinalysis, Routine w reflex microscopic     Status: Abnormal   Collection Time: 08/16/19  9:30 AM  Result Value Ref Range   Color, Urine AMBER (A) YELLOW    Comment: BIOCHEMICALS MAY BE AFFECTED BY COLOR   APPearance TURBID (A) CLEAR   Specific Gravity, Urine 1.020 1.005 - 1.030   pH 5.0 5.0 - 8.0   Glucose, UA NEGATIVE NEGATIVE mg/dL   Hgb urine dipstick MODERATE (A) NEGATIVE   Bilirubin Urine MODERATE (A) NEGATIVE   Ketones, ur NEGATIVE NEGATIVE mg/dL   Protein, ur 30 (  A) NEGATIVE mg/dL   Nitrite NEGATIVE NEGATIVE   Leukocytes,Ua SMALL (A) NEGATIVE   RBC / HPF 0-5 0 - 5 RBC/hpf   WBC, UA 11-20 0 - 5 WBC/hpf   Bacteria, UA MANY (A) NONE SEEN   Budding Yeast PRESENT     Comment: Performed at [facility], [address] [place], [place] [address]  Rapid urine drug screen (hospital performed)     Status: Abnormal   Collection Time: 08/16/19  9:30 AM  Result Value Ref Range   Opiates NONE DETECTED NONE DETECTED   Cocaine NONE DETECTED NONE DETECTED   Benzodiazepines NONE DETECTED NONE DETECTED   Amphetamines NONE DETECTED NONE DETECTED   Tetrahydrocannabinol POSITIVE (A) NONE DETECTED   Barbiturates NONE DETECTED NONE DETECTED    Comment: (NOTE) DRUG SCREEN FOR MEDICAL PURPOSES ONLY.  IF CONFIRMATION IS NEEDED FOR ANY PURPOSE, NOTIFY LAB WITHIN 5 DAYS. LOWEST DETECTABLE LIMITS FOR URINE DRUG SCREEN Drug Class                     Cutoff (ng/mL) Amphetamine and metabolites    1000 Barbiturate and metabolites    200 Benzodiazepine                 200 Tricyclics and metabolites     300 Opiates and metabolites        300 Cocaine and metabolites        300 THC                            50 Performed at [facility], [address] [place], [place] [address]   Strep pneumoniae urinary antigen     Status: Abnormal   Collection Time: 08/16/19  9:30 AM  Result Value Ref Range   Strep Pneumo Urinary Antigen POSITIVE (A) NEGATIVE    Comment: Performed at [facility], [address] [place], [place] [address]  CBG monitoring, ED     Status: Abnormal   Collection Time: 08/16/19 10:28 AM  Result Value Ref Range   Glucose-Capillary 105 (H) 70 - 99 mg/dL  Acetaminophen level     Status: Abnormal   Collection Time: 08/16/19 10:30 AM  Result Value Ref Range   Acetaminophen (Tylenol), Serum <10 (L) 10 - 30 ug/mL    Comment: (NOTE) Therapeutic concentrations vary significantly. A range of 10-30 ug/mL  may be an effective concentration for many patients. However, some  are best treated at concentrations outside of this range. Acetaminophen concentrations >150 ug/mL at 4 hours after ingestion  and >50 ug/mL at 12 hours after ingestion are often associated with  toxic  reactions. Performed at [facility], [address] [place], [place] [address]   APTT     Status: None   Collection Time: 08/16/19 10:30 AM  Result Value Ref Range   aPTT 31 24 - 36 seconds    Comment: Performed at [facility], [address] [place], [place] [address]  Influenza panel by PCR (type A & B)     Status: None   Collection Time: 08/16/19 11:40 AM  Result Value Ref Range   Influenza A By PCR NEGATIVE NEGATIVE   Influenza B By PCR NEGATIVE NEGATIVE    Comment: (NOTE) The Xpert Xpress Flu assay is intended as an aid in the diagnosis of  influenza and should not be used as a sole basis for  treatment.  This  assay is FDA approved for nasopharyngeal swab specimens only. Nasal  washings and aspirates are unacceptable for Xpert Xpress Flu testing. Performed at [facility], [address] [place], [place] [address]   Respiratory Panel by PCR     Status: None   Collection Time: 08/16/19 11:40 AM   Specimen: Nasopharyngeal Swab; Respiratory  Result Value Ref Range   Adenovirus NOT DETECTED NOT DETECTED   Coronavirus 229E NOT DETECTED NOT DETECTED    Comment: (NOTE) The Coronavirus on the Respiratory Panel, DOES NOT test for the novel  Coronavirus (2019 nCoV)    Coronavirus HKU1 NOT DETECTED NOT DETECTED   Coronavirus NL63 NOT DETECTED NOT DETECTED   Coronavirus OC43 NOT DETECTED NOT DETECTED   Metapneumovirus NOT DETECTED NOT DETECTED   Rhinovirus / Enterovirus NOT DETECTED NOT DETECTED   Influenza A NOT DETECTED NOT DETECTED   Influenza B NOT DETECTED NOT DETECTED   Parainfluenza Virus 1 NOT DETECTED NOT DETECTED   Parainfluenza Virus 2 NOT DETECTED NOT DETECTED   Parainfluenza Virus 3 NOT DETECTED NOT DETECTED   Parainfluenza Virus 4 NOT DETECTED NOT DETECTED   Respiratory Syncytial Virus NOT DETECTED NOT DETECTED   Bordetella pertussis NOT DETECTED NOT DETECTED   Chlamydophila pneumoniae NOT DETECTED NOT DETECTED   Mycoplasma pneumoniae NOT  DETECTED NOT DETECTED    Comment: Performed at [facility], [address] [place], [place] [address]   Ct Abdomen Pelvis Wo Contrast  Result Date: 08/16/2019 CLINICAL DATA:  Shortness of breath for 1 week.  Jaundice. EXAM: CT ABDOMEN AND PELVIS WITHOUT CONTRAST TECHNIQUE: Multidetector CT imaging of the abdomen and pelvis was performed following the standard protocol without IV contrast. COMPARISON:  None. FINDINGS: Lower chest: Moderate right pleural effusion. Small left pleural effusion. Patchy airspace opacities in the lung bases consistent with atelectasis, pneumonia or a combination. A component of pneumonia in the left lower lobe is suspected. Moderate heart enlargement. Hepatobiliary: No focal liver abnormality is seen. No gallstones, gallbladder wall thickening, or biliary dilatation. Pancreas: Unremarkable. No pancreatic ductal dilatation or surrounding inflammatory changes. Spleen: Normal in size without focal abnormality. Adrenals/Urinary Tract: No adrenal masses. Kidneys normal size, orientation and position. No renal masses, stones or hydronephrosis. Normal ureters. Normal bladder. Stomach/Bowel: Stomach is within normal limits. Appendix appears normal. No evidence of bowel wall thickening, distention, or inflammatory changes. Vascular/Lymphatic: Aortic atherosclerosis. No aneurysm. No enlarged lymph nodes. Reproductive: Uterus and bilateral adnexa are unremarkable. Other: No abdominal wall hernia or abnormality. No abdominopelvic ascites. Musculoskeletal: Fracture of the upper L1 vertebra with mild endplate depression. This appears recent. No other fractures. No bone lesions. IMPRESSION: 1. Moderate cardiomegaly. Moderate-sized right pleural effusion. Small left pleural effusion. 2. Patchy lung base airspace opacities most prominent in the left lower lobe. Consider pneumonia if there are consistent clinical findings. Findings could be due to atelectasis. 3. Recent appearing nondisplaced  fracture the upper L1 vertebra with mild depression of the upper endplate. 4. No other acute findings within the abdomen or pelvis. No evidence of bile duct dilation. No liver mass or abnormal attenuation. Cause of jaundice not evident. 5. Aortic atherosclerosis. Electronically Signed   By: Amie Portland M.D.   On: 08/16/2019 07:13   Ct Head Wo Contrast  Result Date: 08/16/2019 CLINICAL DATA:  Encephalopathy EXAM: CT HEAD WITHOUT CONTRAST TECHNIQUE: Contiguous axial images were obtained from the base of the skull through the vertex without intravenous contrast. COMPARISON:  None. FINDINGS: Brain: Focal hypoattenuation within  the right frontal lobe, within the anterior cerebral artery distribution. No intracranial hemorrhage or extra-axial collection. No midline shift, other mass effect or hydrocephalus. Vascular: No abnormal hyperdensity of the major intracranial arteries or dural venous sinuses. No intracranial atherosclerosis. Skull: The visualized skull base, calvarium and extracranial soft tissues are normal. Sinuses/Orbits: No fluid levels or advanced mucosal thickening of the visualized paranasal sinuses. No mastoid or middle ear effusion. The orbits are normal. IMPRESSION: 1. Focal hypoattenuation within the paramedian right frontal lobe, likely indicating late acute to subacute infarct. MRI may be helpful for better temporal characterization. 2. No hemorrhage or mass effect. Electronically Signed   By: Deatra Robinson M.D.   On: 08/16/2019 06:57   Mr Brain Wo Contrast  Result Date: 08/16/2019 CLINICAL DATA:  TIA. Altered mental status and generalized weakness. EXAM: MRI HEAD WITHOUT CONTRAST TECHNIQUE: Multiplanar, multiecho pulse sequences of the brain and surrounding structures were obtained without intravenous contrast. COMPARISON:  Head CT 08/16/2019 FINDINGS: Brain: An approximately 3.5 cm wedge-shaped region of T2 hyperintensity, trace diffusion hyperintensity, and facilitated diffusion in the  medial right frontal lobe involves white matter and cortex and is most consistent with a subacute ACA territory infarct. A 3 mm focus of diffusion abnormality involving right parietal white matter also likely reflects a subacute infarct. Low level diffusion abnormality involving the right caudate head may also reflect a subacute infarct. No intracranial hemorrhage, mass effect, or extra-axial fluid collection is identified. The ventricles and sulci are within normal limits for age. Scattered small foci of T2 hyperintensity in the cerebral white matter bilaterally are nonspecific but compatible with mild chronic small vessel ischemic disease. There are small chronic left cerebellar infarcts. Vascular: Major intracranial vascular flow voids are preserved. Skull and upper cervical spine: Unremarkable bone marrow signal. Sinuses/Orbits: Unremarkable orbits. Paranasal sinuses and mastoid air cells are clear. Other: None. IMPRESSION: 1. 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 2. Suspected small subacute infarcts involving the right caudate nucleus and right parietal white matter. 3. Chronic left cerebellar infarcts. Electronically Signed   By: Sebastian Ache M.D.   On: 08/16/2019 10:48   Dg Chest Portable 1 View  Result Date: 08/16/2019 CLINICAL DATA:  Shortness of breath EXAM: PORTABLE CHEST 1 VIEW COMPARISON:  None. FINDINGS: There are multiple areas of increased airspace opacity within both lungs, including the right upper lobe, right lung base and left lung base. There is mild cardiomegaly. No pneumothorax or sizable pleural effusion. IMPRESSION: 1. Multifocal airspace disease in both lungs, which may indicate multifocal infection. 2. Mild cardiomegaly. Electronically Signed   By: Deatra Robinson M.D.   On: 08/16/2019 03:35   Dg Foot Complete Right  Result Date: 08/16/2019 CLINICAL DATA:  Shortness of breath swelling to the right foot EXAM: RIGHT FOOT COMPLETE - 3+ VIEW COMPARISON:  None.  FINDINGS: No fracture or malalignment. Moderate to large plantar calcaneal spur. IMPRESSION: No acute osseous abnormality. Electronically Signed   By: Jasmine Pang M.D.   On: 08/16/2019 03:09    Pending Labs Unresulted Labs (From admission, onward)    Start     Ordered   08/17/19 0500  Comprehensive metabolic panel  Tomorrow morning,   R     08/16/19 0850   08/17/19 0500  CBC  Tomorrow morning,   R     08/16/19 0850   08/17/19 0500  Protime-INR  Tomorrow morning,   R     08/16/19 0850   08/17/19 0500  APTT  Tomorrow morning,  R     08/16/19 0850   08/17/19 0500  Hemoglobin A1c  Tomorrow morning,   R     08/16/19 1154   08/17/19 0500  Lipid panel  Tomorrow morning,   R    Comments: Fasting    08/16/19 1154   08/17/19 0500  Ammonia  Daily,   R     08/16/19 1202   08/16/19 1209  Sedimentation rate  Once,   STAT     08/16/19 1208   08/16/19 1208  High sensitivity CRP  Once,   STAT     08/16/19 1207   08/16/19 1207  Iron and TIBC  Once,   STAT     08/16/19 1207   08/16/19 1207  Ferritin  Once,   STAT     08/16/19 1207   08/16/19 1207  Vitamin B12  Once,   STAT     08/16/19 1207   08/16/19 1207  Folate RBC  Once,   STAT     08/16/19 1207   08/16/19 0851  HIV Antibody (routine testing w rflx)  Once,   STAT     08/16/19 0850   08/16/19 0851  Culture, sputum-assessment  Once,   R     08/16/19 0850   08/16/19 0851  Culture, blood (routine x 2) Call MD if unable to obtain prior to antibiotics being given  BLOOD CULTURE X 2,   R (with STAT occurrences)    Comments: If blood cultures drawn in Emergency Department - Do not draw and cancel order    08/16/19 0850   08/16/19 0851  Legionella Pneumophila Serogp 1 Ur Ag  Once,   STAT     08/16/19 0850   08/16/19 0228  Hepatitis panel, acute  ONCE - STAT,   STAT     08/16/19 0229   08/15/19 2350  Pathologist smear review  Once,   STAT     08/15/19 2350          Vitals/Pain Today's Vitals   08/16/19 1330 08/16/19 1400 08/16/19  1404 08/16/19 1430  BP: 122/85 (!) 133/101 (!) 135/108 (!) 129/95  Pulse:   97 (!) 105  Resp: 19 20  (!) 32  Temp:      TempSrc:      SpO2:    99%  Weight:      Height:      PainSc:        Isolation Precautions No active isolations  Medications Medications  nicotine (NICODERM CQ - dosed in mg/24 hours) patch 21 mg (21 mg Transdermal Patch Applied 08/16/19 1132)  folic acid (FOLVITE) tablet 1 mg (has no administration in time range)  multivitamin with minerals tablet 1 tablet (has no administration in time range)  dextromethorphan-guaiFENesin (MUCINEX DM) 30-600 MG per 12 hr tablet 1 tablet (has no administration in time range)  ondansetron (ZOFRAN) tablet 4 mg (has no administration in time range)    Or  ondansetron (ZOFRAN) injection 4 mg (has no administration in time range)  oxyCODONE (Oxy IR/ROXICODONE) immediate release tablet 5 mg (5 mg Oral Given 08/16/19 0741)  levofloxacin (LEVAQUIN) IVPB 750 mg (0 mg Intravenous Stopped 08/16/19 0915)  methocarbamol (ROBAXIN) tablet 500 mg (has no administration in time range)  thiamine  in normal saline (50ml) IVPB (0 mg Intravenous Stopped 08/16/19 1217)  albuterol (PROVENTIL) (2.5 MG/3ML) 0.083% nebulizer solution 2.5 mg (has no administration in time range)  rifaximin (XIFAXAN) tablet 550 mg (has no administration in time range)  lactulose (CHRONULAC)  10 GM/15ML solution 10 g (has no administration in time range)   stroke: mapping our early stages of recovery book (has no administration in time range)  aspirin suppository 300 mg (300 mg Rectal Given 08/16/19 1220)    Or  aspirin tablet 325 mg ( Oral See Alternative 08/16/19 1220)  dextrose 5 %-0.9 % sodium chloride infusion ( Intravenous New Bag/Given 08/16/19 1223)  sodium chloride 0.9 % bolus 1,000 mL (0 mLs Intravenous Stopped 08/16/19 0726)    Mobility walks     Focused Assessments Neuro Assessment Handoff:  Swallow screen pass? No    NIH Stroke Scale ( + Modified Stroke  Scale Criteria)  Interval: Initial Level of Consciousness (1a.)   : Not alert, but arousable by minor stimulation to obey, answer, or respond(to person and time) LOC Questions (1b. )   +: Answers one question correctly(unable to stay awake to complete task) LOC Commands (1c. )   + : Performs one task correctly(unable to stay awake to complete task) Best Gaze (2. )  +: (unable to stay awake to complete task) Visual (3. )  +: (uanble to stay awake to complete task) Facial Palsy (4. )    : Normal symmetrical movements Motor Arm, Left (5a. )   +: No drift Motor Arm, Right (5b. )   +: No drift Motor Leg, Left (6a. )   +: (able to move oleg up, but won't stay awake to keep leg up ) Motor Leg, Right (6b. )   +: (able to lift leg, but won't stay awake to keep leg up) Limb Ataxia (7. ): (won't stay awake to complete task) Sensory (8. )   +: (wont stay awake to complete task) Best Language (9. )   +: (won't stay awake to complete task) Dysarthria (10. ): (pt hard to understand, bu tnot sure if due to drowsiness ) Extinction/Inattention (11.)   +: (uanble to stay awake to complete task)     Neuro Assessment: Exceptions to WDL Neuro Checks:   Initial (08/16/19 1200)  Last Documented NIHSS Modified Score:   Has TPA been given? No If patient is a Neuro Trauma and patient is going to OR before floor call report to 4N Charge nurse: [phone] or [phone]     R Recommendations: See Admitting Provider Note  Report given to:   Additional Notes:

## 2019-08-16 NOTE — ED Notes (Signed)
Lunch tray ordered 

## 2019-08-17 ENCOUNTER — Inpatient Hospital Stay (HOSPITAL_COMMUNITY): Payer: 59

## 2019-08-17 DIAGNOSIS — G9341 Metabolic encephalopathy: Secondary | ICD-10-CM

## 2019-08-17 DIAGNOSIS — K7030 Alcoholic cirrhosis of liver without ascites: Secondary | ICD-10-CM

## 2019-08-17 DIAGNOSIS — J90 Pleural effusion, not elsewhere classified: Secondary | ICD-10-CM

## 2019-08-17 DIAGNOSIS — R945 Abnormal results of liver function studies: Secondary | ICD-10-CM

## 2019-08-17 DIAGNOSIS — I63411 Cerebral infarction due to embolism of right middle cerebral artery: Secondary | ICD-10-CM

## 2019-08-17 DIAGNOSIS — I509 Heart failure, unspecified: Secondary | ICD-10-CM

## 2019-08-17 DIAGNOSIS — I998 Other disorder of circulatory system: Secondary | ICD-10-CM

## 2019-08-17 DIAGNOSIS — N179 Acute kidney failure, unspecified: Secondary | ICD-10-CM

## 2019-08-17 DIAGNOSIS — I639 Cerebral infarction, unspecified: Secondary | ICD-10-CM

## 2019-08-17 DIAGNOSIS — I240 Acute coronary thrombosis not resulting in myocardial infarction: Secondary | ICD-10-CM | POA: Diagnosis present

## 2019-08-17 DIAGNOSIS — I739 Peripheral vascular disease, unspecified: Secondary | ICD-10-CM | POA: Diagnosis present

## 2019-08-17 DIAGNOSIS — I6340 Cerebral infarction due to embolism of unspecified cerebral artery: Secondary | ICD-10-CM | POA: Diagnosis present

## 2019-08-17 DIAGNOSIS — I5041 Acute combined systolic (congestive) and diastolic (congestive) heart failure: Secondary | ICD-10-CM

## 2019-08-17 DIAGNOSIS — I513 Intracardiac thrombosis, not elsewhere classified: Secondary | ICD-10-CM

## 2019-08-17 DIAGNOSIS — I82431 Acute embolism and thrombosis of right popliteal vein: Secondary | ICD-10-CM

## 2019-08-17 DIAGNOSIS — I472 Ventricular tachycardia: Secondary | ICD-10-CM

## 2019-08-17 DIAGNOSIS — K7290 Hepatic failure, unspecified without coma: Secondary | ICD-10-CM

## 2019-08-17 DIAGNOSIS — J189 Pneumonia, unspecified organism: Secondary | ICD-10-CM

## 2019-08-17 LAB — LIPID PANEL
Cholesterol: 93 mg/dL (ref 0–200)
HDL: <10 mg/dL — ABNORMAL LOW (ref >40)
Triglycerides: 177 mg/dL — ABNORMAL HIGH (ref <150)
VLDL: 35 mg/dL (ref 0–40)

## 2019-08-17 LAB — COMPREHENSIVE METABOLIC PANEL
ALT: 39 U/L (ref 0–44)
AST: 54 U/L — ABNORMAL HIGH (ref 15–41)
Albumin: 1.9 g/dL — ABNORMAL LOW (ref 3.5–5.0)
Alkaline Phosphatase: 76 U/L (ref 38–126)
Anion gap: 12 (ref 5–15)
BUN: 41 mg/dL — ABNORMAL HIGH (ref 6–20)
CO2: 18 mmol/L — ABNORMAL LOW (ref 22–32)
Calcium: 8 mg/dL — ABNORMAL LOW (ref 8.9–10.3)
Chloride: 100 mmol/L (ref 98–111)
Creatinine, Ser: 1.3 mg/dL — ABNORMAL HIGH (ref 0.44–1.00)
GFR calc Af Amer: 54 mL/min — ABNORMAL LOW (ref >60)
GFR calc non Af Amer: 46 mL/min — ABNORMAL LOW (ref >60)
Glucose, Bld: 135 mg/dL — ABNORMAL HIGH (ref 70–99)
Potassium: 4.4 mmol/L (ref 3.5–5.1)
Sodium: 130 mmol/L — ABNORMAL LOW (ref 135–145)
Total Bilirubin: 12.1 mg/dL — ABNORMAL HIGH (ref 0.3–1.2)
Total Protein: 6.5 g/dL (ref 6.5–8.1)

## 2019-08-17 LAB — CBC
HCT: 45.8 % (ref 36.0–46.0)
Hemoglobin: 15.8 g/dL — ABNORMAL HIGH (ref 12.0–15.0)
MCH: 35 pg — ABNORMAL HIGH (ref 26.0–34.0)
MCHC: 34.5 g/dL (ref 30.0–36.0)
MCV: 101.6 fL — ABNORMAL HIGH (ref 80.0–100.0)
Platelets: 106 10*3/uL — ABNORMAL LOW (ref 150–400)
RBC: 4.51 MIL/uL (ref 3.87–5.11)
RDW: 19.9 % — ABNORMAL HIGH (ref 11.5–15.5)
WBC: 12 10*3/uL — ABNORMAL HIGH (ref 4.0–10.5)
nRBC: 3.1 % — ABNORMAL HIGH (ref 0.0–0.2)

## 2019-08-17 LAB — PROTIME-INR
INR: 1.9 — ABNORMAL HIGH (ref 0.8–1.2)
Prothrombin Time: 21.6 seconds — ABNORMAL HIGH (ref 11.4–15.2)

## 2019-08-17 LAB — HIV ANTIBODY (ROUTINE TESTING W REFLEX): HIV Screen 4th Generation wRfx: NONREACTIVE

## 2019-08-17 LAB — HIGH SENSITIVITY CRP: CRP, High Sensitivity: 132.18 mg/L — ABNORMAL HIGH (ref 0.00–3.00)

## 2019-08-17 LAB — AMMONIA: Ammonia: 39 umol/L — ABNORMAL HIGH (ref 9–35)

## 2019-08-17 LAB — HEPATITIS PANEL, ACUTE
HCV Ab: 0.1 s/co ratio (ref 0.0–0.9)
Hep A IgM: NEGATIVE
Hep B C IgM: NEGATIVE
Hepatitis B Surface Ag: NEGATIVE

## 2019-08-17 LAB — APTT: aPTT: 33 seconds (ref 24–36)

## 2019-08-17 LAB — HEMOGLOBIN A1C
Hgb A1c MFr Bld: 5.8 % — ABNORMAL HIGH (ref 4.8–5.6)
Mean Plasma Glucose: 119.76 mg/dL

## 2019-08-17 LAB — HEPARIN LEVEL (UNFRACTIONATED): Heparin Unfractionated: <0.1 IU/mL — ABNORMAL LOW (ref 0.30–0.70)

## 2019-08-17 MED ORDER — FUROSEMIDE 10 MG/ML IJ SOLN
40.0000 mg | Freq: Every day | INTRAMUSCULAR | Status: DC
  Administered 2019-08-17 – 2019-08-20 (×4): 40 mg via INTRAVENOUS
  Filled 2019-08-17 (×4): qty 4

## 2019-08-17 MED ORDER — LEVOFLOXACIN 750 MG PO TABS
750.0000 mg | ORAL_TABLET | ORAL | Status: DC
  Administered 2019-08-18 – 2019-08-20 (×2): 750 mg via ORAL
  Filled 2019-08-17: qty 1
  Filled 2019-08-17 (×2): qty 2

## 2019-08-17 MED ORDER — HEPARIN (PORCINE) 25000 UT/250ML-% IV SOLN
1750.0000 [IU]/h | INTRAVENOUS | Status: DC
  Administered 2019-08-17: 1000 [IU]/h via INTRAVENOUS
  Administered 2019-08-18: 1200 [IU]/h via INTRAVENOUS
  Administered 2019-08-18: 20:00:00 1700 [IU]/h via INTRAVENOUS
  Filled 2019-08-17 (×4): qty 250

## 2019-08-17 MED ORDER — LACTULOSE 10 GM/15ML PO SOLN
10.0000 g | Freq: Two times a day (BID) | ORAL | Status: DC
  Administered 2019-08-17 – 2019-08-23 (×11): 10 g via ORAL
  Filled 2019-08-17 (×2): qty 30
  Filled 2019-08-17 (×5): qty 15
  Filled 2019-08-17 (×3): qty 30
  Filled 2019-08-17: qty 15

## 2019-08-17 MED ORDER — SPIRONOLACTONE 12.5 MG HALF TABLET
12.5000 mg | ORAL_TABLET | Freq: Every day | ORAL | Status: DC
  Administered 2019-08-17 – 2019-08-19 (×3): 12.5 mg via ORAL
  Filled 2019-08-17 (×4): qty 1

## 2019-08-17 MED ORDER — DIGOXIN 125 MCG PO TABS
0.1250 mg | ORAL_TABLET | Freq: Every day | ORAL | Status: DC
  Administered 2019-08-17 – 2019-08-25 (×9): 0.125 mg via ORAL
  Filled 2019-08-17 (×9): qty 1

## 2019-08-17 NOTE — Consult Note (Signed)
Referring Physician: D rGhimire  Patient name: Briana Andrews MRN: [id number] DOB: 1966/09/30 Sex: female  REASON FOR CONSULT: bilateral ischemic feet  HPI: Briana Andrews is a 53 y.o. 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  She was noted to have dusky feet and wound on right foot at admission.  Pt is not a very good historian on events of the past few day.  Apparently had several falls but does not know what happened to right foot.  She is a long term smoker.  ECHO showed LV thrombus and she is on heparin. MRA although suboptimal did not show carotid lesion but bifurcation and ICA not well visualized. She is coagulopathic with INR 1.9 and thrombocytopenic plt 106.  Albumin is 1.9 Bili 12 with confusion making her Ardine Eng class C liver dysfunction.  Past Medical History:  Diagnosis Date  . Alcohol abuse   . Tobacco abuse    No past surgical history on file.  Family History  Problem Relation Age of Onset  . Heart disease Mother     SOCIAL HISTORY: Social History   Socioeconomic History  . Marital status: Married    Spouse name: Not on file  . Number of children: Not on file  . Years of education: Not on file  . Highest education level: Not on file  Occupational History  . Not on file  Social Needs  . Financial resource strain: Not on file  . Food insecurity    Worry: Not on file    Inability: Not on file  . Transportation needs    Medical: Not on file    Non-medical: Not on file  Tobacco Use  . Smoking status: Current Every Day Smoker  . Smokeless tobacco: Never Used  Substance and Sexual Activity  . Alcohol use: Yes  . Drug use: Yes    Types: Marijuana  . Sexual activity: Not on file  Lifestyle  . Physical activity    Days per week: Not on file    Minutes per session: Not on file  . Stress: Not on file  Relationships  . Social Herbalist on phone: Not on file    Gets together: Not on file    Attends  religious service: Not on file    Active member of club or organization: Not on file    Attends meetings of clubs or organizations: Not on file    Relationship status: Not on file  . Intimate partner violence    Fear of current or ex partner: Not on file    Emotionally abused: Not on file    Physically abused: Not on file    Forced sexual activity: Not on file  Other Topics Concern  . Not on file  Social History Narrative  . Not on file    Allergies  Allergen Reactions  . Penicillins Other (See Comments)    Unsure of reaction Did it involve swelling of the face/tongue/throat, SOB, or low BP? Unknown Did it involve sudden or severe rash/hives, skin peeling, or any reaction on the inside of your mouth or nose? Unknown Did you need to seek medical attention at a hospital or doctor's office? Unknown When did it last happen?[place] If all above answers are "NO", may proceed with cephalosporin use.    Current Facility-Administered Medications  Medication Dose Route Frequency Provider Last Rate Last Dose  . albuterol (PROVENTIL) (2.5 MG/3ML) 0.083% nebulizer solution 2.5 mg  2.5 mg  Nebulization Q4H PRN Dorcas Carrow, MD      . dextromethorphan-guaiFENesin (MUCINEX DM) 30-600 MG per 12 hr tablet 1 tablet  1 tablet Oral BID PRN Lorretta Harp, MD      . dextrose 5 %-0.9 % sodium chloride infusion   Intravenous Continuous Dorcas Carrow, MD 100 mL/hr at 08/17/19 0116    . folic acid (FOLVITE) tablet 1 mg  1 mg Oral Daily Lorretta Harp, MD   1 mg at 08/17/19 1115  . furosemide (LASIX) injection 40 mg  40 mg Intravenous Daily Strader, [place] M, PA-C      . heparin ADULT infusion 100 units/mL (25000 units/262mL sodium chloride 0.45%)  1,000 Units/hr Intravenous Continuous Juliette Mangle, RPH 10 mL/hr at 08/17/19 0815 1,000 Units/hr at 08/17/19 0815  . lactulose (CHRONULAC) 10 GM/15ML solution 10 g  10 g Oral BID Brahmbhatt, Parag, MD   10 g at 08/17/19 0842  . levofloxacin (LEVAQUIN) IVPB  750 mg  750 mg Intravenous Q48H Titus Mould, RPH   Stopped at 08/16/19 0915  . methocarbamol (ROBAXIN) tablet 500 mg  500 mg Oral Q6H PRN Lorretta Harp, MD   500 mg at 08/16/19 2137  . multivitamin with minerals tablet 1 tablet  1 tablet Oral Daily Lorretta Harp, MD   1 tablet at 08/17/19 0816  . nicotine (NICODERM CQ - dosed in mg/24 hours) patch 21 mg  21 mg Transdermal Daily Lorretta Harp, MD   21 mg at 08/17/19 0816  . ondansetron (ZOFRAN) tablet 4 mg  4 mg Oral Q6H PRN Lorretta Harp, MD       Or  . ondansetron [facility]) injection 4 mg  4 mg Intravenous Q6H PRN Lorretta Harp, MD      . oxyCODONE (Oxy IR/ROXICODONE) immediate release tablet 5 mg  5 mg Oral Q6H PRN Lorretta Harp, MD   5 mg at 08/16/19 2137  . rifaximin (XIFAXAN) tablet 550 mg  550 mg Oral BID Brahmbhatt, Parag, MD   550 mg at 08/17/19 0816  . thiamine 500mg  in normal saline (60ml) IVPB  500 mg Intravenous Daily Dorcas Carrow, MD 100 mL/hr at 08/17/19 0823 500 mg at 08/17/19 0823    ROS:   Unable to obtain due to confusion  Physical Examination  Vitals:   08/16/19 2354 08/17/19 0349 08/17/19 0554 08/17/19 0802  BP: (!) 118/91 118/88 107/82 119/87  Pulse: (!) 101 98 82 95  Resp: 16 15    Temp: 97.6 F (36.4 C) 97.6 F (36.4 C) 97.6 F (36.4 C) 97.6 F (36.4 C)  TempSrc: Oral Oral Oral Oral  SpO2: 100% 97% 100% 100%  Weight:      Height:        Body mass index is 29.95 kg/m.  General:  Alert and oriented to person and place but not time  HEENT: Normal Neck: No JVD Pulmonary: Clear to auscultation bilaterally Cardiac: Regular Rate and Rhythm  Skin: No rash, complete epidermal loss dorsum right foot with dusky toes bilaterally Extremity Pulses:  2+ radial, brachial, femoral, absent popliteal dorsalis pedis, posterior tibial pulses bilaterally Musculoskeletal: No deformity or edema  Neurologic: Upper and lower extremity motor 5/5 and symmetric  DATA:  CBC    Component Value Date/Time   WBC 12.0 (H) 08/17/2019 0439    RBC 4.51 08/17/2019 0439   HGB 15.8 (H) 08/17/2019 0439   HCT 45.8 08/17/2019 0439   PLT 106 (L) 08/17/2019 0439   MCV 101.6 (H) 08/17/2019 0439   MCH 35.0 (H) 08/17/2019  0439   MCHC 34.5 08/17/2019 0439   RDW 19.9 (H) 08/17/2019 0439   LYMPHSABS 1.5 08/15/2019 2350   MONOABS 0.4 08/15/2019 2350   EOSABS 0.1 08/15/2019 2350   BASOSABS 0.0 08/15/2019 2350    BMET    Component Value Date/Time   NA 130 (L) 08/17/2019 0439   K 4.4 08/17/2019 0439   CL 100 08/17/2019 0439   CO2 18 (L) 08/17/2019 0439   GLUCOSE 135 (H) 08/17/2019 0439   BUN 41 (H) 08/17/2019 0439   CREATININE 1.30 (H) 08/17/2019 0439   CALCIUM 8.0 (L) 08/17/2019 0439   GFRNONAA 46 (L) 08/17/2019 0439   GFRAA 54 (L) 08/17/2019 0439   ECHO global hypokinesis EF 15-20% with LV thrombus  ASSESSMENT:  Most likely diffuse cardioembolic showering of lower extremities.  She does have doppler flow to feet but this is marginal.  She probably does not have adequate perfusion currently to heal either foot.  She is a very marginal operative candidate due to her severe cardiac and liver dysfunction as well as subacute stroke and LV thrombus  PLAN:  1. Will get carotid duplex to rule out carotid source of stroke  2. Will get ABIs and lower extremity duplex which will give us better indication of her perfusion and underlying anatomy of her arteries.  Would continue heparin as you are doing.  May need bilateral embolectomies if evidence of acute thrombus but suspect she also has underlying chronic arterial atherosclerosis as well.   Fabienne Brunsharles Fields, MD [facility] [place] Office: [phone] Pager: [phone]

## 2019-08-17 NOTE — Progress Notes (Signed)
Carotid duplex and LE arterial duplex       has been completed. Preliminary results can be found under CV proc through chart review. June Leap, BS, RDMS, RVT    Results of LEA called to Dr. Oneida Alar

## 2019-08-17 NOTE — Evaluation (Signed)
Physical Therapy Evaluation Patient Details Name: Briana Andrews MRN: [id number] DOB: Jul 22, 1966 Today's Date: 08/17/2019   History of Present Illness  53 year old female with history of smoking, alcohol use, she drinks more than 6 packs of beer every day since last 20 years, no chronic medical issues brought to the emergency room with confusion more than usual and falls.  Since last few months there have been noticing some changes on her including weakness on her legs and poor appetite as much as she stopped smoking and decreased her drinking.  2 weeks ago, she was taken to [facility] with a fall, skeletal survey was negative except L1 fracture with 25% height loss and discharged home on muscle relaxants.  Patient has been intermittently confused since then. In the emergency room, she was found with subacute right ACA stroke, jaundice with bilirubin of 12, bilateral multifocal pneumonia and metabolic encephalopathy.    Clinical Impression  Pt admitted with above diagnosis. PTA pt independent. On eval, she required mod assist bed mobility and mod assist transfers. Unable to progress ambulation due to R foot pain. Pt currently with functional limitations due to the deficits listed below (see PT Problem List). Pt will benefit from skilled PT to increase their independence and safety with mobility to allow discharge to the venue listed below.       Follow Up Recommendations CIR    Equipment Recommendations  Other (comment)(TBD)    Recommendations for Other Services Rehab consult     Precautions / Restrictions Precautions Precautions: Fall      Mobility  Bed Mobility Overal bed mobility: Needs Assistance Bed Mobility: Supine to Sit;Sit to Supine     Supine to sit: Mod assist;HOB elevated Sit to supine: Mod assist;HOB elevated   General bed mobility comments: +rail, cues for sequencing, assist with RLE and to elevate trunk  Transfers Overall transfer level: Needs  assistance Equipment used: None Transfers: Sit to/from [facility] Sit to Stand: Mod assist Stand pivot transfers: Mod assist       General transfer comment: assist to power up and pivot bed <> BSC  Ambulation/Gait             General Gait Details: unable to progress due to R foot pain  Stairs            Wheelchair Mobility    Modified Rankin (Stroke Patients Only) Modified Rankin (Stroke Patients Only) Pre-Morbid Rankin Score: No symptoms Modified Rankin: Moderately severe disability     Balance Overall balance assessment: Needs assistance Sitting-balance support: Feet supported;Bilateral upper extremity supported Sitting balance-Leahy Scale: Fair Sitting balance - Comments: min guard assist EOB   Standing balance support: Bilateral upper extremity supported;During functional activity Standing balance-Leahy Scale: Poor Standing balance comment: reliant on external support                             Pertinent Vitals/Pain Pain Assessment: Faces Faces Pain Scale: Hurts even more Pain Location: R foot Pain Descriptors / Indicators: Grimacing;Guarding;Sore Pain Intervention(s): Limited activity within patient's tolerance;Repositioned;Monitored during session    [place] expects to be discharged to:: Private residence Living Arrangements: Spouse/significant other;Children Available Help at Discharge: Family;Available 24 hours/day Type of Home: House Home Access: Ramped entrance     Home Layout: One level Home Equipment: Walker - 2 wheels Additional Comments: RW belonged to pt's father in law who passed away in 24-Nov-2018.    Prior Function Level of  Independence: Independent               Hand Dominance        Extremity/Trunk Assessment   Upper Extremity Assessment Upper Extremity Assessment: Defer to OT evaluation    Lower Extremity Assessment Lower Extremity Assessment: Generalized  weakness;Difficult to assess due to impaired cognition       Communication   Communication: Receptive difficulties;Expressive difficulties  Cognition Arousal/Alertness: Awake/alert Behavior During Therapy: Flat affect Overall Cognitive Status: Impaired/Different from baseline Area of Impairment: Attention;Memory;Safety/judgement;Problem solving;Awareness                   Current Attention Level: Sustained Memory: Decreased short-term memory   Safety/Judgement: Decreased awareness of deficits Awareness: Intellectual Problem Solving: Slow processing;Decreased initiation;Difficulty sequencing;Requires verbal cues General Comments: Easily distracted. Cues to stay on task.      General Comments General comments (skin integrity, edema, etc.): Pt on 3L O2. Husband present during session.    Exercises     Assessment/Plan    PT Assessment Patient needs continued PT services  PT Problem List Decreased strength;Decreased mobility;Decreased safety awareness;Decreased knowledge of precautions;Decreased activity tolerance;Decreased cognition;Cardiopulmonary status limiting activity;Decreased balance;Decreased knowledge of use of DME;Pain       PT Treatment Interventions DME instruction;Therapeutic activities;Cognitive remediation;Gait training;Therapeutic exercise;Patient/family education;Stair training;Balance training;Functional mobility training    PT Goals (Current goals can be found in the Care Plan section)  Acute Rehab PT Goals Patient Stated Goal: not stated PT Goal Formulation: With patient/family Time For Goal Achievement: 08/31/19 Potential to Achieve Goals: Good    Frequency Min 4X/week   Barriers to discharge        Co-evaluation               AM-PAC PT "6 Clicks" Mobility  Outcome Measure Help needed turning from your back to your side while in a flat bed without using bedrails?: A Little Help needed moving from lying on your back to sitting on the  side of a flat bed without using bedrails?: A Lot Help needed moving to and from a bed to a chair (including a wheelchair)?: A Lot Help needed standing up from a chair using your arms (e.g., wheelchair or bedside chair)?: A Lot Help needed to walk in hospital room?: A Lot Help needed climbing 3-5 steps with a railing? : Total 6 Click Score: 12    End of Session Equipment Utilized During Treatment: Gait belt;Oxygen Activity Tolerance: Patient limited by pain Patient left: in bed;with call bell/phone within reach;with bed alarm set;with family/visitor present Nurse Communication: Mobility status PT Visit Diagnosis: Other abnormalities of gait and mobility (R26.89);Pain;Muscle weakness (generalized) (M62.81) Pain - Right/Left: Right Pain - part of body: Ankle and joints of foot    Time: 1355-1423 PT Time Calculation (min) (ACUTE ONLY): 28 min   Charges:   PT Evaluation $PT Eval Moderate Complexity: 1 Mod PT Treatments $Therapeutic Activity: 8-22 mins        Aida Raider, PT  Office # [phone] Pager [phone]   Ilda Foil 08/17/2019, 4:21 PM

## 2019-08-17 NOTE — Progress Notes (Addendum)
STROKE TEAM PROGRESS NOTE   INTERVAL HISTORY Pt is sitting in bed. Awake alert, mildly lethargic. Admitted that she has been drinking too much and she felt something gonna happen to her. Her TTE showed EF 15-20% with LV thrombus. Her right foot and toes also had ischemic changes. US showed b/l total popliteal artery occlusion with right popliteal vein DVT. INR today 1.9. on heparin IV. Ammonia level 39. B/l pleural effusion, cardiology on board.   OBJECTIVE Vitals:   08/17/19 0349 08/17/19 0554 08/17/19 0802 08/17/19 1206  BP: 118/88 107/82 119/87 (!) 138/109  Pulse: 98 82 95 93  Resp: 15   18  Temp: 97.6 F (36.4 C) 97.6 F (36.4 C) 97.6 F (36.4 C) 98.2 F (36.8 C)  TempSrc: Oral Oral Oral Oral  SpO2: 97% 100% 100% 100%  Weight:      Height:        CBC:  Recent Labs  Lab 08/15/19 2350 08/17/19 0439  WBC 9.7 12.0*  NEUTROABS 7.7  --   HGB 16.9* 15.8*  HCT 48.6* 45.8  MCV 100.8* 101.6*  PLT 134* 106*    Basic Metabolic Panel:  Recent Labs  Lab 08/15/19 2350 08/17/19 0439  NA 133* 130*  K 4.1 4.4  CL 97* 100  CO2 21* 18*  GLUCOSE 119* 135*  BUN 47* 41*  CREATININE 1.64* 1.30*  CALCIUM 8.7* 8.0*    Lipid Panel:     Component Value Date/Time   CHOL 93 08/17/2019 0439   TRIG 177 (H) 08/17/2019 0439   HDL <10 (L) 08/17/2019 0439   CHOLHDL NOT CALCULATED 08/17/2019 0439   VLDL 35 08/17/2019 0439   LDLCALC NOT CALCULATED 08/17/2019 0439   HgbA1c:  Lab Results  Component Value Date   HGBA1C 5.8 (H) 08/17/2019   Urine Drug Screen:     Component Value Date/Time   LABOPIA NONE DETECTED 08/16/2019 0930   COCAINSCRNUR NONE DETECTED 08/16/2019 0930   LABBENZ NONE DETECTED 08/16/2019 0930   AMPHETMU NONE DETECTED 08/16/2019 0930   THCU POSITIVE (A) 08/16/2019 0930   LABBARB NONE DETECTED 08/16/2019 0930    Alcohol Level     Component Value Date/Time   ETH <10 08/16/2019 0228    IMAGING  Ct Abdomen Pelvis Wo Contrast 08/16/2019 IMPRESSION:  1.  Moderate cardiomegaly. Moderate-sized right pleural effusion. Small left pleural effusion.  2. Patchy lung base airspace opacities most prominent in the left lower lobe. Consider pneumonia if there are consistent clinical findings. Findings could be due to atelectasis.  3. Recent appearing nondisplaced fracture the upper L1 vertebra with mild depression of the upper endplate.  4. No other acute findings within the abdomen or pelvis. No evidence of bile duct dilation. No liver mass or abnormal attenuation. Cause of jaundice not evident.  5. Aortic atherosclerosis.   Ct Head Wo Contrast 08/16/2019 IMPRESSION:  1. Focal hypoattenuation within the paramedian right frontal lobe, likely indicating late acute to subacute infarct. MRI may be helpful for better temporal characterization.  2. No hemorrhage or mass effect.  Mr Angio Head Wo Contrast 08/16/2019 IMPRESSION:  1. Markedly motion degraded examination.  2. No emergent large vessel occlusion or high-grade proximal stenosis.   Mr Angio Neck Wo Contrast 08/16/2019 IMPRESSION:  Severely motion limited study. The common carotid arteries and vertebral artery V2 segments are patent. Visualization of the internal carotid arteries is markedly limited. Consider repeat examination with contrast when the patient is better able to follow instructions.   Mr Brain Wo Contrast 08/17/2019  ADDENDUM:  Not mentioned in the original report is a subcentimeter linear focus of subtle diffusion abnormality in the subcortical white matter of the left middle frontal gyrus corresponding to faint enhancement on subsequent postcontrast brain MRI and also likely reflecting a subacute infarct.  08/17/2019 IMPRESSION:  1. 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  2. Suspected small subacute infarcts involving the right caudate nucleus and right parietal white matter.  3. Chronic left cerebellar infarcts.  Mr Laqueta JeanBrain W Contrast 08/16/2019 IMPRESSION:   1. Gyriform post ischemic appearing enhancement in the right ACA territory, corresponding to most confluent diffusion abnormality earlier today.  2. Faint linear enhancement in the left frontal lobe white matter appears to reflect an area of late subacute ischemia.   Dg Chest Portable 1 View 08/16/2019 IMPRESSION:  1. Multifocal airspace disease in both lungs, which may indicate multifocal infection.  2. Mild cardiomegaly.   Dg Foot Complete Right 08/16/2019 IMPRESSION:  No acute osseous abnormality.   [place] Carotid 08/17/2019 Summary:  Right Carotid: Velocities in the right ICA are consistent with a 1-39% stenosis. Left Carotid: Velocities in the left ICA are consistent with a 1-39% stenosis.   Preliminary    [place] Lower Extremity Arterial Duplex 08/17/2019  Summary:  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 Incidental finding Right popliteal vein DVT.  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     Preliminary     Transthoracic Echocardiogram  08/16/2019 MPRESSIONS  1. Left ventricular ejection fraction, by visual estimation, is approximately 15-20%. Mildly increased left ventricular size. There is borderline left ventricular hypertrophy.  2. Large, fixed thrombus on the apical septal wall of the left ventricle measuring 3.1 x 1.4 cm.  3. The average left ventricular global longitudinal strain is -4.0 %.  4. Left ventricular diastolic Doppler parameters are consistent with pseudonormalization pattern of LV diastolic filling.  5. Left atrial size was moderately dilated.  6. Mild to moderate mitral annular calcification.  7. The mitral valve is degenerative. Mild to moderate mitral valve regurgitation.  8. The tricuspid valve is grossly normal. Tricuspid valve regurgitation is mild.  9. The pulmonic valve was grossly normal. Pulmonic valve regurgitation is trivial by color flow Doppler. 10. Mildly elevated pulmonary artery systolic pressure. 11. The  aortic valve has an indeterminant number of cusps, possibly functionally bicuspid. Aortic valve regurgitation is trivial by color flow Doppler. Mild aortic valve sclerosis without stenosis. 12. Right atrial size was normal. 13. Multiple segmental abnormalities exist. See findings. 14. The inferior vena cava is dilated in size with >50% respiratory variability, suggesting right atrial pressure of 8 mmHg. 15. Global right ventricle has severely reduced systolic function.The right ventricular size is normal. No increase in right ventricular wall thickness.   ECG - SR rate  93 BPM. (See cardiology reading for complete details)   PHYSICAL EXAM  Temp:  [97.6 F (36.4 C)-98.2 F (36.8 C)] 98.2 F (36.8 C) (09/20 1206) Pulse Rate:  [82-108] 93 (09/20 1206) Resp:  [15-18] 18 (09/20 1206) BP: (107-138)/(82-109) 138/109 (09/20 1206) SpO2:  [97 %-100 %] 100 % (09/20 1206)  General - Well nourished, well developed, in no apparent distress, but mildly lethargic.  Ophthalmologic - fundi not visualized due to noncooperation.  Cardiovascular - Regular rate and rhythm.  Mental Status -  Level of arousal and orientation to time, place, and person were intact. Language including expression, naming, repetition, comprehension was assessed and found intact.  Cranial Nerves II - XII -  II - Visual field intact OU.  III, IV, VI - Extraocular movements intact.  Sclera jaundice V - Facial sensation intact bilaterally. VII - Facial movement intact bilaterally. VIII - Hearing & vestibular intact bilaterally. X - Palate elevates symmetrically. XI - Chin turning & shoulder shrug intact bilaterally. XII - Tongue protrusion intact.  Motor Strength - The patient's strength was 4/5 in all extremities and pronator drift was absent.  Bulk was normal and fasciculations were absent.   Motor Tone - Muscle tone was assessed at the neck and appendages and was normal.  Reflexes - The patient's reflexes were  symmetrical in all extremities and she had no pathological reflexes.  Sensory - Light touch, temperature/pinprick were assessed and were symmetrical.    Coordination - The patient had normal movements in the hands with no ataxia or dysmetria.  Tremor was absent.  Gait and Station - deferred.   ASSESSMENT/PLAN Ms. Elizabella Nolet is a 53 y.o. female with history of tobacco and Etoh abuse presenting with confusion, intermittent SOB, whole body weakness for several weeks and a recent fall.   She did not receive IV t-PA due to late presentation.  Stroke - multiple right ACA patchy infarcts and left frontal linear small subacute infarct - cardioembolic due to low EF and large LV thrombus.   CT head - Focal hypoattenuation within the paramedian right frontal lobe, likely indicating late acute to subacute infarct.  MRI head with and without contrast right ACA, ACA/MCA, caudate head acute infarcts and left frontal linear small subacute infarct. Chronic left cerebellar infarcts.  MRA head and neck - motion degraded but no LVO  Carotid Doppler - both carotids 1-39%  2D Echo - EF 15 - 20%. Large, fixed thrombus on the apical septal wall of the left ventricle measuring 3.1 x 1.4 cm.  [employer] Virus 2 - negative  LDL - not calculated due to low HDL  HgbA1c - 5.8  Hypercoagulable labs pending  UDS - THCU  VTE prophylaxis - IV heparin  No antithrombotic prior to admission, now on heparin IV.   Patient counseled to be compliant with her antithrombotic medications  Ongoing aggressive stroke risk factor management  Therapy recommendations:  CIR  Disposition:  Pending  Cardiomyopathy and LV thrombus  TTE showed EF 15 to 20%  Large LV thrombus  Likely responsible for the stroke and BLE arterial occlusion  Cardiology on board  On IV heparin  On Lasix and spironolactone  LE arterial occlusion  Bilateral Lower Extremity Venous Dopplers - Total occlusion of Lt and Rt  popliteal arteries.   Right foot and toes discoloration   VVS on board  Likely related to LV thrombus  LE DVT  Incidental finding Rt popliteal vein DVT.  Likely hypercoagulable state  On heparin IV  Alcoholic cirrhosis  Coagulopathy, INR 1.7-1.9  Ammonia level 39  AST/ALT 54/39  Thrombocytopenia platelet 134-106  GI on board  Elevated bilirubin 12.7-12.1  On rifaximin and Levaquin  On folic acid, B1, multivitamin  CIWA protocol  Tobacco abuse  Current smoker  Smoking cessation counseling provided  Nicotine patch provided  Pt is willing to quit  Other Stroke Risk Factors  Obesity, Body mass index is 29.95 kg/m., recommend weight loss, diet and exercise as appropriate   Hx stroke/TIA by imaging  Other Active Problems  L1 compression fx  Probable pneumonia  CKD stage II creatinine - 1.64->1.30  Hyponatremia - 133->130  Leukocytosis - 9.7->12.0 (afebrile)  Polycythemia - Hb - 16.9->15.8, likely  secondary to smoking  Hospital day # 1  Marvel Plan, MD PhD Stroke Neurology 08/17/2019 6:25 PM  I spent  35 minutes in total face-to-face time with the patient, more than 50% of which was spent in counseling and coordination of care, reviewing test results, images and medication, and discussing the diagnosis of stroke, cardiomyopathy, LV thrombus, alcoholic cirrhosis, PVD, DVT, thrombocytopenia, leukocytosis treatment plan and potential prognosis. This patient's care requiresreview of multiple databases, neurological assessment, discussion with family, other specialists and medical decision making of high complexity.  To contact Stroke Continuity provider, please refer to [email or web address]. After hours, contact General Neurology

## 2019-08-17 NOTE — Evaluation (Signed)
Speech Language Pathology Evaluation Patient Details Name: Briana Andrews MRN: [id number] DOB: February 27, 1966 Today's Date: 08/17/2019 Time:  -     Problem List:  Patient Active Problem List   Diagnosis Date Noted  . Cerebral embolism with cerebral infarction 08/17/2019  . PVD (peripheral vascular disease) ([place]) 08/17/2019  . Left ventricular apical thrombus without MI 08/17/2019  . Acute CHF (congestive heart failure) ([place]) 08/17/2019  . Abnormal LFTs 08/16/2019  . Acute metabolic encephalopathy 37/85/8850  . SOB (shortness of breath) 08/16/2019  . AKI (acute kidney injury) ([place]) 08/16/2019  . Fall 08/16/2019  . Lactic acidosis 08/16/2019  . Back pain 08/16/2019  . Liver failure without hepatic coma ([place]) 08/16/2019  . Multifocal pneumonia 08/16/2019  . Alcohol abuse   . Tobacco abuse   . Shortness of breath    Past Medical History:  Past Medical History:  Diagnosis Date  . Alcohol abuse   . Tobacco abuse    Past Surgical History: No past surgical history on file. HPI:  53 year old female with history of smoking, alcohol use, she drinks more than 6 packs of beer every day since last 20 years, no chronic medical issues brought to the emergency room with confusion more than usual and falls.  Since last few months there have been noticing some changes on her including weakness on her legs and poor appetite as much as she stopped smoking and decreased her drinking.  2 weeks ago, she was taken to [facility] with a fall, skeletal survey was negative except L1 fracture with 25% height loss and discharged home on muscle relaxants.  Patient has been intermittently confused since then. In the emergency room, she was found with subacute right ACA stroke, jaundice with bilirubin of 12, bilateral multifocal pneumonia and metabolic encephalopathy.   Assessment / Plan / Recommendation Clinical Impression  Pt presents with primary cognitive deficits in the areas of sustained  attention, thus impacting working memory and problem solving. Pt scores a 10/30 on the [facility] Cognitive Assessment primarily due to inability to sustain attention to instructions or complete tasks. She was easily distracted by pain in her foot. She does demonstrate complex lanaguage ability though there were a few times she struggled with word finding. Recommend ongoing SLP interventions to target cognitive function, may benefit from CIR after d/c.     SLP Assessment  SLP Recommendation/Assessment: Patient needs continued Speech Lanaguage Pathology Services SLP Visit Diagnosis: Attention and concentration deficit Attention and concentration deficit following: Cerebral infarction    Follow Up Recommendations       Frequency and Duration min 2x/week  2 weeks      SLP Evaluation Cognition  Overall Cognitive Status: Impaired/Different from baseline Arousal/Alertness: Awake/alert Orientation Level: Oriented to person;Disoriented to place;Disoriented to time;Oriented to situation Attention: Focused;Sustained Focused Attention: Appears intact Sustained Attention: Impaired Sustained Attention Impairment: Verbal basic;Functional basic Memory: Impaired Memory Impairment: Storage deficit;Retrieval deficit;Decreased short term memory Decreased Short Term Memory: Verbal basic;Functional basic Awareness: Impaired Awareness Impairment: Intellectual impairment Problem Solving: Impaired Problem Solving Impairment: Verbal basic       Comprehension  Auditory Comprehension Overall Auditory Comprehension: Impaired Yes/No Questions: Within Functional Limits Commands: Impaired One Step Basic Commands: 75-100% accurate Multistep Basic Commands: 0-24% accurate Conversation: Simple Interfering Components: Attention;Working memory;Pain    Expression Verbal Expression Other Verbal Expression Comments: mild word finding deficits, some circumlocution   Oral / Motor  Oral Motor/Sensory  Function Overall Oral Motor/Sensory Function: Within functional limits Motor Speech Overall Motor Speech: Appears within functional limits  for tasks assessed   GO                    Ranae Casebier, Riley Nearing 08/17/2019, 2:55 PM

## 2019-08-17 NOTE — Progress Notes (Signed)
PROGRESS NOTE    Briana Andrews  ZOX:[id number] DOB: 05/18/1966 DOA: 08/15/2019 PCP: Patient, [name]    Brief Narrative:   53 year old female with history of smoking, alcohol use, she drinks more than 6 packs of beer every day since last 20 years, no chronic medical issues brought to the emergency room with confusion more than usual and falls. According to the patient's husband, she has been doing well until this summer.  Since last few months there have been noticing some changes on her including weakness on her legs and poor appetite as much as she stopped smoking and decreased her drinking.  2 weeks ago, she was taken to [facility] with [facility], skeletal survey was negative except L1 fracture with 25% height loss and discharged home on muscle relaxants.  Patient has been intermittently confused since then.  After coming to the emergency room she was found with multiple problems as below. In the emergency room, she was found with subacute right ACA stroke, jaundice with bilirubin of 12, bilateral multifocal pneumonia and metabolic encephalopathy.   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  Also suspect noninfectious cause, may be related to CHF. Chest x-ray consistent with multiple area opacities.  WBC count was normal.  Lactic acid was elevated, however also has liver disease.  Started on Levaquin that we will continue.  Patient does not have obvious pulmonary symptoms.  Reasonable to treat as pneumonia. Blood  cultures were done, results are pending.  Legionella, streptococcal antigen, respiratory virus panel.  Oxygen as needed.  Bronchodilator as needed.  Subacute ischemic stroke, right ACA with encephalopathy: Neurochecks and vital signs as per stroke protocol. Patient was not a TPA and vascular intervention candidate because of unknown duration of symptoms. Passed bedside swallow test, on diet. Statin, contraindicated due to liver disease. Consultations, neurology, speech, PT OT MRI of the brain shows right ACA territory stroke, no hemorrhage. carotid ultrasound 2D echocardiogram, ejection fraction 15% with left ventricular thrombus. Lipid panel and A1c normal. Starting patient on heparin as she also has evidence of systemic thromboembolism.  Abnormal LFTs: Suspect chronic alcoholic liver disease.  Transaminases are minimally elevated.  CT scan abdomen with no evidence of biliary abnormalities.  Symptomatic treatment with IV fluids.  Less likely hemochromatosis. Seen by GI, appreciate their input.  Started on rifaximin and lactulose. May have component of acute alcoholic hepatitis. We will check duplex of the liver bed to look for any thromboembolism.  Bilateral lower leg pigmentation and discoloration: Severe peripheral arterial disease.  Likely systemic thromboembolism.  She does have dopplerable pulses.   Case discussed with vascular surgery, seen in consultation.  ABI pending.  Now on heparin.  Alcoholism / encephalopathy:  Patient is at risk of withdrawal, however she is too lethargic and sick looking.  Discontinued benzodiazepines.   Treating with high-dose thiamine, will continue for 5 days.    New diagnosed acute systolic heart failure, left ventricular thrombus: Ejection fraction 15%.  Patient is not clinically stable to undergo  ischemic evaluation or cardiac catheterization.  Discussed with cardiology, started on IV Lasix.  Symptomatic treatment.  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  DVT prophylaxis: heparin infusion. Code Status: Full code Family Communication: Husband Disposition Plan: Unknown at this time. Critically sick.    Consultants:   Gastroenterology  Neurology  Vascular surgery  Procedures:   None  Antimicrobials:   Levaquin, 08/16/2019   Subjective: Patient seen and examined.  No overnight events.  Still looks very lethargic, however she is able to keep up conversation.  Denies any nausea or vomiting.  Objective: Vitals:   08/16/19 2354 08/17/19 0349 08/17/19 0554 08/17/19 0802  BP: (!) 118/91 118/88 107/82 119/87  Pulse: (!) 101 98 82 95  Resp: 16 15    Temp: 97.6 F (36.4 C) 97.6 F (36.4 C) 97.6 F (36.4 C) 97.6 F (36.4 C)  TempSrc: Oral Oral Oral Oral  SpO2: 100% 97% 100% 100%  Weight:      Height:        Intake/Output Summary (Last 24 hours) at 08/17/2019 1132 Last data filed at 08/17/2019 1044 Gross per 24 hour  Intake 2313.44 ml  Output 1 ml  Net 2312.44 ml   Filed Weights   08/15/19 2345  Weight: 81.6 kg    Examination:  General exam: Appears calm but sick looking.  Jaundiced. Respiratory system: Clear to auscultation. Respiratory effort normal.  No added sound. Cardiovascular system: S1 & S2 heard, RRR. No JVD, murmurs, rubs, gallops or clicks. No pedal edema. Gastrointestinal system: Abdomen is nondistended, soft and nontender. No organomegaly or masses felt. Normal bowel sounds heard.  No ascites. Central nervous system: Alert and awake, not oriented.  Does not have any tremors.  She was able to move all extremities. Extremities: Symmetric 5 x 5 power.  Generalized weakness. Skin: No rashes, skin sloughing and abrasion right dorsum of the foot. Psychiatry: Judgement and insight appear compromised.  mood & affect flat. Bilateral lower extremity: Dusky discoloration of the tips of the toes mostly on the plantar surface, peripheral pulses are present and dopplerable, she also has dark  pigmentation all throughout the dorsum of the foot. Taken pictures with patient's permission.         Data Reviewed: I have personally reviewed following labs and imaging studies  CBC: Recent Labs  Lab 08/15/19 2350 08/17/19 0439  WBC 9.7 12.0*  NEUTROABS 7.7  --   HGB 16.9* 15.8*  HCT 48.6* 45.8  MCV 100.8* 101.6*  PLT 134* 106*   Basic Metabolic Panel: Recent Labs  Lab 08/15/19 2350 08/17/19 0439  NA 133* 130*  K 4.1 4.4  CL 97* 100  CO2 21* 18*  GLUCOSE 119* 135*  BUN 47* 41*  CREATININE 1.64* 1.30*  CALCIUM 8.7* 8.0*   GFR: Estimated Creatinine Clearance: 52.2 mL/min (A) (by C-G formula based on SCr of 1.3 mg/dL (H)). Liver Function Tests: Recent Labs  Lab 08/15/19 2350 08/17/19 0439  AST 59* 54*  ALT 43 39  ALKPHOS 83 76  BILITOT 12.7* 12.1*  PROT 7.7 6.5  ALBUMIN 2.4* 1.9*   Recent Labs  Lab 08/15/19 2350  LIPASE 49   Recent Labs  Lab 08/15/19 2350 08/17/19 0439  AMMONIA 18 39*   Coagulation Profile: Recent Labs  Lab 08/16/19 0016 08/17/19 0439  INR 1.7* 1.9*   Cardiac Enzymes: Recent Labs  Lab 08/16/19 1948  CKTOTAL 196   BNP (last 3 results) No results for input(s): PROBNP in the last 8760 hours. HbA1C: Recent Labs  08/17/19 0439  HGBA1C 5.8*   CBG: Recent Labs  Lab 08/16/19 1028  GLUCAP 105*   Lipid Profile: Recent Labs    08/17/19 0439  CHOL 93  HDL <10*  LDLCALC NOT CALCULATED  TRIG 177*  CHOLHDL NOT CALCULATED   Thyroid Function Tests: No results for input(s): TSH, T4TOTAL, FREET4, T3FREE, THYROIDAB in the last 72 hours. Anemia Panel: Recent Labs    08/16/19 1249  VITAMINB12 1,894*  FERRITIN 429*  TIBC 216*  IRON 84   Sepsis Labs: Recent Labs  Lab 08/15/19 2350 08/16/19 0400  LATICACIDVEN 2.8* 2.0*    Recent Results (from the past 240 hour(s))  SARS Coronavirus 2 [facility](Hospital order, Performed in [facility] hospital lab) Nasopharyngeal Nasopharyngeal Swab     Status: None   Collection  Time: 08/16/19  3:03 AM   Specimen: Nasopharyngeal Swab  Result Value Ref Range Status   SARS Coronavirus 2 NEGATIVE NEGATIVE Final    Comment: (NOTE) If result is NEGATIVE SARS-CoV-2 target nucleic acids are NOT DETECTED. The SARS-CoV-2 RNA is generally detectable in upper and lower  respiratory specimens during the acute phase of infection. The lowest  concentration of SARS-CoV-2 viral copies this assay can detect is 250  copies / mL. A negative result does not preclude SARS-CoV-2 infection  and should not be used as the sole basis for treatment or other  patient management decisions.  A negative result may occur with  improper specimen collection / handling, submission of specimen other  than nasopharyngeal swab, presence of viral mutation(s) within the  areas targeted by this assay, and inadequate number of viral copies  (<250 copies / mL). A negative result must be combined with clinical  observations, patient history, and epidemiological information. If result is POSITIVE SARS-CoV-2 target nucleic acids are DETECTED. The SARS-CoV-2 RNA is generally detectable in upper and lower  respiratory specimens dur ing the acute phase of infection.  Positive  results are indicative of active infection with SARS-CoV-2.  Clinical  correlation with patient history and other diagnostic information is  necessary to determine patient infection status.  Positive results do  not rule out bacterial infection or co-infection with other viruses. If result is PRESUMPTIVE POSTIVE SARS-CoV-2 nucleic acids MAY BE PRESENT.   A presumptive positive result was obtained on the submitted specimen  and confirmed on repeat testing.  While 2019 novel coronavirus  (SARS-CoV-2) nucleic acids may be present in the submitted sample  additional confirmatory testing may be necessary for epidemiological  and / or clinical management purposes  to differentiate between  SARS-CoV-2 and other Sarbecovirus currently known  to infect humans.  If clinically indicated additional testing with an alternate test  methodology [phone]) is advised. The SARS-CoV-2 RNA is generally  detectable in upper and lower respiratory sp ecimens during the acute  phase of infection. The expected result is Negative. Fact Sheet for Patients:  BoilerBrush.com.cyhttps://www.fda.gov/media/136312/download Fact Sheet for Healthcare Providers: https://pope.com/https://www.fda.gov/media/136313/download This test is not yet approved or cleared by the [place] FDA and has been authorized for detection and/or diagnosis of SARS-CoV-2 by FDA under an Emergency Use Authorization (EUA).  This EUA will remain in effect (meaning this test can be used) for the duration of the COVID-19 declaration under Section 564(b)(1) of the Act, 21 U.S.C. section 360bbb-3(b)(1), unless the authorization is terminated or revoked sooner. Performed at [facility], [address] [place], [place] [address]   Respiratory Panel by PCR     Status: None   Collection Time:  08/16/19 11:40 AM   Specimen: Nasopharyngeal Swab; Respiratory  Result Value Ref Range Status   Adenovirus NOT DETECTED NOT DETECTED Final   Coronavirus 229E NOT DETECTED NOT DETECTED Final    Comment: (NOTE) The Coronavirus on the Respiratory Panel, DOES NOT test for the novel  Coronavirus (2019 nCoV)    Coronavirus HKU1 NOT DETECTED NOT DETECTED Final   Coronavirus NL63 NOT DETECTED NOT DETECTED Final   Coronavirus OC43 NOT DETECTED NOT DETECTED Final   Metapneumovirus NOT DETECTED NOT DETECTED Final   Rhinovirus / Enterovirus NOT DETECTED NOT DETECTED Final   Influenza A NOT DETECTED NOT DETECTED Final   Influenza B NOT DETECTED NOT DETECTED Final   Parainfluenza Virus 1 NOT DETECTED NOT DETECTED Final   Parainfluenza Virus 2 NOT DETECTED NOT DETECTED Final   Parainfluenza Virus 3 NOT DETECTED NOT DETECTED Final   Parainfluenza Virus 4 NOT DETECTED NOT DETECTED Final   Respiratory Syncytial Virus NOT  DETECTED NOT DETECTED Final   Bordetella pertussis NOT DETECTED NOT DETECTED Final   Chlamydophila pneumoniae NOT DETECTED NOT DETECTED Final   Mycoplasma pneumoniae NOT DETECTED NOT DETECTED Final    Comment: Performed at [facility], [address] [place], [place] [address]         Radiology Studies: Ct Abdomen Pelvis Wo Contrast  Result Date: 08/16/2019 CLINICAL DATA:  Shortness of breath for 1 week.  Jaundice. EXAM: CT ABDOMEN AND PELVIS WITHOUT CONTRAST TECHNIQUE: Multidetector CT imaging of the abdomen and pelvis was performed following the standard protocol without IV contrast. COMPARISON:  None. FINDINGS: Lower chest: Moderate right pleural effusion. Small left pleural effusion. Patchy airspace opacities in the lung bases consistent with atelectasis, pneumonia or a combination. A component of pneumonia in the left lower lobe is suspected. Moderate heart enlargement. Hepatobiliary: No focal liver abnormality is seen. No gallstones, gallbladder wall thickening, or biliary dilatation. Pancreas: Unremarkable. No pancreatic ductal dilatation or surrounding inflammatory changes. Spleen: Normal in size without focal abnormality. Adrenals/Urinary Tract: No adrenal masses. Kidneys normal size, orientation and position. No renal masses, stones or hydronephrosis. Normal ureters. Normal bladder. Stomach/Bowel: Stomach is within normal limits. Appendix appears normal. No evidence of bowel wall thickening, distention, or inflammatory changes. Vascular/Lymphatic: Aortic atherosclerosis. No aneurysm. No enlarged lymph nodes. Reproductive: Uterus and bilateral adnexa are unremarkable. Other: No abdominal wall hernia or abnormality. No abdominopelvic ascites. Musculoskeletal: Fracture of the upper L1 vertebra with mild endplate depression. This appears recent. No other fractures. No bone lesions. IMPRESSION: 1. Moderate cardiomegaly. Moderate-sized right pleural effusion. Small left pleural effusion. 2.  Patchy lung base airspace opacities most prominent in the left lower lobe. Consider pneumonia if there are consistent clinical findings. Findings could be due to atelectasis. 3. Recent appearing nondisplaced fracture the upper L1 vertebra with mild depression of the upper endplate. 4. No other acute findings within the abdomen or pelvis. No evidence of bile duct dilation. No liver mass or abnormal attenuation. Cause of jaundice not evident. 5. Aortic atherosclerosis. Electronically Signed   By: Amie Portland M.D.   On: 08/16/2019 07:13   Ct Head Wo Contrast  Result Date: 08/16/2019 CLINICAL DATA:  Encephalopathy EXAM: CT HEAD WITHOUT CONTRAST TECHNIQUE: Contiguous axial images were obtained from the base of the skull through the vertex without intravenous contrast. COMPARISON:  None. FINDINGS: Brain: Focal hypoattenuation within the right frontal lobe, within the anterior cerebral artery distribution. No intracranial hemorrhage or extra-axial collection. No midline shift, other mass effect or hydrocephalus. Vascular: No abnormal hyperdensity  of the major intracranial arteries or dural venous sinuses. No intracranial atherosclerosis. Skull: The visualized skull base, calvarium and extracranial soft tissues are normal. Sinuses/Orbits: No fluid levels or advanced mucosal thickening of the visualized paranasal sinuses. No mastoid or middle ear effusion. The orbits are normal. IMPRESSION: 1. Focal hypoattenuation within the paramedian right frontal lobe, likely indicating late acute to subacute infarct. MRI may be helpful for better temporal characterization. 2. No hemorrhage or mass effect. Electronically Signed   By: Ulyses Jarred M.D.   On: 08/16/2019 06:57   Mr Angio Head Wo Contrast  Result Date: 08/16/2019 CLINICAL DATA:  Stroke follow-up EXAM: MRA HEAD WITHOUT CONTRAST TECHNIQUE: Angiographic images of the Circle of Willis were obtained using MRA technique without intravenous contrast. COMPARISON:  Brain  MRI 08/16/2019 FINDINGS: The study is markedly motion degraded. POSTERIOR CIRCULATION: --Vertebral arteries: Normal V4 segments. --Posterior inferior cerebellar arteries (PICA): Patent origins from the vertebral arteries. --Anterior inferior cerebellar arteries (AICA): Patent origins from the basilar artery. --Basilar artery: Normal. --Superior cerebellar arteries: Normal. --Posterior cerebral arteries: Normal. The left PCA is predominantly supplied by the posterior communicating artery. ANTERIOR CIRCULATION: --Intracranial internal carotid arteries: Normal. --Anterior cerebral arteries (ACA): Normal. Both A1 segments are present. Patent anterior communicating artery (a-comm). --Middle cerebral arteries (MCA): Normal. IMPRESSION: 1. Markedly motion degraded examination. 2. No emergent large vessel occlusion or high-grade proximal stenosis. Electronically Signed   By: Ulyses Jarred M.D.   On: 08/16/2019 23:37   Mr Angio Neck Wo Contrast  Result Date: 08/16/2019 CLINICAL DATA:  Stroke follow-up EXAM: MRA NECK WITHOUT CONTRAST TECHNIQUE: Multiplanar and multiecho pulse sequences of the neck were obtained without intravenous contrast. Angiographic images of the neck were obtained using time-of-flight MRA technique. CONTRAST:  None COMPARISON:  None. FINDINGS: Study is degraded by motion and the lack of intravenous contrast material. The visualized portions of the common carotid arteries are normal. The V2 segments of both vertebral arteries are patent. There is limited visualization of the internal carotid arteries. IMPRESSION: Severely motion limited study. The common carotid arteries and vertebral artery V2 segments are patent. Visualization of the internal carotid arteries is markedly limited. Consider repeat examination with contrast when the patient is better able to follow instructions. Electronically Signed   By: Ulyses Jarred M.D.   On: 08/16/2019 23:49   Mr Brain Wo Contrast  Addendum Date: 08/17/2019     ADDENDUM REPORT: 08/17/2019 09:46 ADDENDUM: Not mentioned in the original report is a subcentimeter linear focus of subtle diffusion abnormality in the subcortical white matter of the left middle frontal gyrus corresponding to faint enhancement on subsequent postcontrast brain MRI and also likely reflecting a subacute infarct. Electronically Signed   By: Logan Bores M.D.   On: 08/17/2019 09:46   Result Date: 08/17/2019 CLINICAL DATA:  TIA. Altered mental status and generalized weakness. EXAM: MRI HEAD WITHOUT CONTRAST TECHNIQUE: Multiplanar, multiecho pulse sequences of the brain and surrounding structures were obtained without intravenous contrast. COMPARISON:  Head CT 08/16/2019 FINDINGS: Brain: An approximately 3.5 cm wedge-shaped region of T2 hyperintensity, trace diffusion hyperintensity, and facilitated diffusion in the medial right frontal lobe involves white matter and cortex and is most consistent with a subacute ACA territory infarct. A 3 mm focus of diffusion abnormality involving right parietal white matter also likely reflects a subacute infarct. Low level diffusion abnormality involving the right caudate head may also reflect a subacute infarct. No intracranial hemorrhage, mass effect, or extra-axial fluid collection is identified. The ventricles and sulci are within  normal limits for age. Scattered small foci of T2 hyperintensity in the cerebral white matter bilaterally are nonspecific but compatible with mild chronic small vessel ischemic disease. There are small chronic left cerebellar infarcts. Vascular: Major intracranial vascular flow voids are preserved. Skull and upper cervical spine: Unremarkable bone marrow signal. Sinuses/Orbits: Unremarkable orbits. Paranasal sinuses and mastoid air cells are clear. Other: None. IMPRESSION: 1. 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 2. Suspected small subacute infarcts involving the right caudate nucleus and right parietal white  matter. 3. Chronic left cerebellar infarcts. Electronically Signed: By: Sebastian AcheAllen  Grady M.D. On: 08/16/2019 10:48   Mr Brain W Contrast  Result Date: 08/16/2019 CLINICAL DATA:  53 year old female with altered mental status and generalized weakness, multiple subacute infarcts on brain MRI earlier today. EXAM: MRI HEAD WITH CONTRAST TECHNIQUE: Multiplanar, multiecho pulse sequences of the brain and surrounding structures were obtained with intravenous contrast. CONTRAST:  8mL GADAVIST GADOBUTROL 1 MMOL/ML IV SOLN COMPARISON:  Noncontrast brain MRI earlier today. FINDINGS: Mild gyriform post ischemic appearing enhancement in the anterior right superior frontal gyrus, corresponding to the most confluent area of diffusion signal abnormality earlier today (series 7, image 22). Faint linear post ischemic appearing enhancement also in the white matter of the left middle frontal gyrus, corresponding to subtle diffusion abnormality today). No other abnormal intracranial enhancement.  No dural thickening. The major dural venous sinuses are enhancing and appear to be patent. No intracranial mass effect or ventriculomegaly. IMPRESSION: 1. Gyriform post ischemic appearing enhancement in the right ACA territory, corresponding to most confluent diffusion abnormality earlier today. 2. Faint linear enhancement in the left frontal lobe white matter appears to reflect an area of late subacute ischemia. Electronically Signed   By: Odessa FlemingH  Hall M.D.   On: 08/16/2019 16:58   Dg Chest Portable 1 View  Result Date: 08/16/2019 CLINICAL DATA:  Shortness of breath EXAM: PORTABLE CHEST 1 VIEW COMPARISON:  None. FINDINGS: There are multiple areas of increased airspace opacity within both lungs, including the right upper lobe, right lung base and left lung base. There is mild cardiomegaly. No pneumothorax or sizable pleural effusion. IMPRESSION: 1. Multifocal airspace disease in both lungs, which may indicate multifocal infection. 2. Mild  cardiomegaly. Electronically Signed   By: Deatra RobinsonKevin  Herman M.D.   On: 08/16/2019 03:35   Dg Foot Complete Right  Result Date: 08/16/2019 CLINICAL DATA:  Shortness of breath swelling to the right foot EXAM: RIGHT FOOT COMPLETE - 3+ VIEW COMPARISON:  None. FINDINGS: No fracture or malalignment. Moderate to large plantar calcaneal spur. IMPRESSION: No acute osseous abnormality. Electronically Signed   By: Jasmine PangKim  Fujinaga M.D.   On: 08/16/2019 03:09        Scheduled Meds:  folic acid  1 mg Oral Daily   furosemide  40 mg Intravenous Daily   lactulose  10 g Oral BID   multivitamin with minerals  1 tablet Oral Daily   nicotine  21 mg Transdermal Daily   rifaximin  550 mg Oral BID   Continuous Infusions:  dextrose 5 % and 0.9% NaCl 100 mL/hr at 08/17/19 0116   heparin 1,000 Units/hr (08/17/19 0815)   levofloxacin (LEVAQUIN) IV Stopped (08/16/19 0915)   thiamine injection 500 mg (08/17/19 0823)     LOS: 1 day    Time spent: 45 minutes    Dorcas CarrowKuber Beatrice Ziehm, MD [facility] Pager [phone]  If 7PM-7AM, please contact night-coverage [email or web address] Password [email or web address] 08/17/2019, 11:32 AM

## 2019-08-17 NOTE — Progress Notes (Signed)
[facility] Progress Note  Briana Andrews 53 y.o. [birth year]  CC: Possible alcoholic hepatitis with encephalopathy   Subjective: She is feeling better now.  Alert and oriented x3 this morning.  Had one bowel movement today.  Denies any blood in the stool or black stool.  Tolerating diet.  ROS : Negative for chest pain and shortness of breath.   Objective: Vital signs in last 24 hours: Vitals:   08/17/19 0554 08/17/19 0802  BP: 107/82 119/87  Pulse: 82 95  Resp:    Temp: 97.6 F (36.4 C) 97.6 F (36.4 C)  SpO2: 100% 100%    Physical Exam:  General:  Alert, cooperative, no distress, appears stated age  Head:  Normocephalic, without obvious abnormality, atraumatic  Eyes:   Scleral icterus noted  Lungs:   Clear to auscultation bilaterally, respirations unlabored, anterior exam only  Heart:  Regular rate and rhythm, S1, S2 normal  Abdomen:    Abdomen mildly distended, nontender, bowel sounds present.  No peritoneal sign  Extremities:  Right lower extremity dressing noted.       Lab Results: Recent Labs    08/15/19 2350 08/17/19 0439  NA 133* 130*  K 4.1 4.4  CL 97* 100  CO2 21* 18*  GLUCOSE 119* 135*  BUN 47* 41*  CREATININE 1.64* 1.30*  CALCIUM 8.7* 8.0*   Recent Labs    08/15/19 2350 08/17/19 0439  AST 59* 54*  ALT 43 39  ALKPHOS 83 76  BILITOT 12.7* 12.1*  PROT 7.7 6.5  ALBUMIN 2.4* 1.9*   Recent Labs    08/15/19 2350  WBC 9.7  NEUTROABS 7.7  HGB 16.9*  HCT 48.6*  MCV 100.8*  PLT 134*   Recent Labs    08/16/19 0016 08/17/19 0439  LABPROT 20.1* 21.6*  INR 1.7* 1.9*      Assessment/Plan: -Jaundice.  Significantly elevated T bili at 12.7 with mild elevated AST at 59.  Normal ALT and alkaline phosphatase.  CT scan showed normal-appearing liver and pancreas.  No biliary dilatation.  No gallstones or gall bladder wall thickening.  Most likely from alcoholic hepatitis. -Altered mental status.  MRI concerning for recent  stroke.   Recommendations ------------------------- - Discriminant function score only moderately elevated at 39 today ( up from 35) -  Recheck CMP and INR in the morning. - Hepatitis panel negative.  Iron saturation of only 39%.  Elevated ferritin at 429 could be from acute phase reaction.  Follow blood culture and urine culture. -Continue  rifaximin.  Lactulose as needed to have 2-3 small bowel movements per day. -GI will follow  Otis Brace MD, [place] 08/17/2019, 8:24 AM  Contact #  [phone]

## 2019-08-17 NOTE — Progress Notes (Signed)
IV to PO Antibiotic Note   This patient is receiving the antibiotic levofloxacin by the intravenous route. Based on criteria approved by the Pharmacy and Therapeutics Committee, and the Infectious Disease Division, the antibiotic is being converted to equivalent oral dose form. These criteria include:  -Patient being treated for a respiratory tract infection, urinary tract infection,  cellulitis, or Clostridium Difficile Associated Diarrhea  -The patient is not neutropenic and does not exhibit a GI malabsorption state  -The patient is eating (either orally or per tube) and/or has been taking other  orally administered medications for at least 24 hours.  -The patient is improving clinically (physician assessment and a 24-hour Tmax Of 100.53F).  If you have questions about this conversion, please contact the pharmacy department. Thank you  Nicoletta Dress, PharmD PGY2 Infectious Disease Pharmacy Resident

## 2019-08-17 NOTE — Progress Notes (Signed)
ANTICOAGULATION CONSULT NOTE  Pharmacy Consult:  Heparin Indication:  LV thrombus and CVA  Allergies  Allergen Reactions  . Penicillins Other (See Comments)    Unsure of reaction Did it involve swelling of the face/tongue/throat, SOB, or low BP? Unknown Did it involve sudden or severe rash/hives, skin peeling, or any reaction on the inside of your mouth or nose? Unknown Did you need to seek medical attention at a hospital or doctor's office? Unknown When did it last happen?[place] If all above answers are "NO", may proceed with cephalosporin use.    Patient Measurements: Height: 5\' 5"  (165.1 cm) Weight: 180 lb (81.6 kg) IBW/kg (Calculated) : 57 Heparin Dosing Weight: 75 kg  Vital Signs: Temp: 98.2 F (36.8 C) (09/20 1206) Temp Source: Oral (09/20 1640) BP: 132/100 (09/20 1640) Pulse Rate: 93 (09/20 1640)  Labs: Recent Labs    08/15/19 2350 08/16/19 0016 08/16/19 1030 08/16/19 1948 08/17/19 0439 08/17/19 1829  HGB 16.9*  --   --   --  15.8*  --   HCT 48.6*  --   --   --  45.8  --   PLT 134*  --   --   --  106*  --   APTT  --   --  31  --  33  --   LABPROT  --  20.1*  --   --  21.6*  --   INR  --  1.7*  --   --  1.9*  --   HEPARINUNFRC  --   --   --   --   --  <0.10*  CREATININE 1.64*  --   --   --  1.30*  --   CKTOTAL  --   --   --  196  --   --     Estimated Creatinine Clearance: 52.2 mL/min (A) (by C-G formula based on SCr of 1.3 mg/dL (H)).  Assessment: 29 YOF with acute stroke and LV thrombus to continue on IV heparin.  Heparin level is undetectable.  No issue with heparin infusion nor bleeding per RN.  Will increase heparin conservatively due to acute CVA.  Goal of Therapy:  Heparin level 0.3-0.5 units/ml Monitor platelets by anticoagulation protocol: Yes   Plan:  Increase heparin gtt to 1200 units/hr  Check 6 hr heparin level  Bani Gianfrancesco D. Mina Marble, PharmD, BCPS, [place] 08/17/2019, 8:11 PM

## 2019-08-17 NOTE — Consult Note (Addendum)
Cardiology Consult    Patient ID: Briana Andrews; [id number]; 1966/05/14   Admit date: 08/15/2019 Date of Consult: 08/17/2019  Primary Care Provider: Patient, [name] Primary Cardiologist: New to [facility]  Patient Profile    Briana Andrews is a 53 y.o. female with past medical history of alcohol use and tobacco use with no prior cardiac history who is being seen today for the evaluation of newly diagnosed cardiomyopathy at the request of Dr. Sloan Leiter.   History of Present Illness    Briana Andrews presented to [facility] ED on 08/15/2019 for evaluation of worsening dyspnea for the past week. Was noted to be jaundiced at the time of ED evaluation and family members mentioned she had been experiencing worsening confusion. Initial labs showed WBC 9.7, Hgb 16.9, platelets 134, Na+ 133, K+ 4.1, creatinine 1.64, AST 59, ALT 43 and total bilirubin 12.7. Lipase WNL. Lactic Acid 2.8. INR 1.7. Ethanol negative. UDS positive for THC. Lactic Acid 2.8. COVID negative. EKG showed NSR, HR 73, with PVC's and LVH with diffuse TWI along inferior and lateral leads (no prior tracings available for comparison). CXR showed multifocal airspace disease and mild cardiomegaly. CT Head showed focal hypoattenuation within the paramedian right frontal lobe, likely indicating late acute to subacute infarct and MRI confirmed a subacute right frontal lobe infarct in the ACA territory with suspected small subacute infarcts involving the right caudate nucleus and right parietal white matter. CT Abdomen with cardiomegaly and moderate right-sized pleural effusion and aortic atherosclerosis.   She was admitted and started on antibiotic therapy for multifocal PNA. Neurology also following in the setting of her CVA which was felt to be cardioembolic in etiology. An echocardiogram was performed as part of her CVA workup and showed a significantly reduced EF of 15-20% with a large fixed thrombus on the apical septal wall of the left  ventricle measuring 3.1 x 1.4 cm.Also noted to have mild to moderate MR and mild TR.   In talking with the patient today, she is able to tell me she has experienced shortness of breath prior to admission but is unable to elaborate on the timeframe of this. Denies any associated chest pain. Says she does have occasional palpitations. Unaware of any orthopnea, PND, or lower extremity edema.  No known history of CAD or CHF. Says she has not been evaluated by a PCP in several years.  No known family history of CAD or CHF.  She does smoke approximately 1 pack/day and consumes 4-5 beers on a daily basis.    Past Medical History:  Diagnosis Date   Alcohol abuse    Tobacco abuse     No past surgical history on file.   Home Medications:  Prior to Admission medications   Medication Sig Start Date End Date Taking? Authorizing Provider  ibuprofen (ADVIL) 200 MG tablet Take 200 mg by mouth every 6 (six) hours as needed for mild pain.   Yes [provider]  naproxen (NAPROSYN) 500 MG tablet Take 1 tablet (500 mg total) by mouth 2 (two) times daily with a meal. Patient not taking: Reported on 08/16/2019 08/04/19   Johnn Hai, PA-C  traMADol (ULTRAM) 50 MG tablet Take 1 tablet (50 mg total) by mouth every 6 (six) hours as needed. Patient not taking: Reported on 08/16/2019 08/04/19   Johnn Hai, PA-C    Inpatient Medications: Scheduled Meds:  folic acid  1 mg Oral Daily   furosemide  40 mg Intravenous Daily  lactulose  10 g Oral BID   multivitamin with minerals  1 tablet Oral Daily   nicotine  21 mg Transdermal Daily   rifaximin  550 mg Oral BID   Continuous Infusions:  dextrose 5 % and 0.9% NaCl 100 mL/hr at 08/17/19 0116   heparin 1,000 Units/hr (08/17/19 0815)   levofloxacin (LEVAQUIN) IV Stopped (08/16/19 0915)   thiamine injection 500 mg (08/17/19 0823)   PRN Meds: albuterol, dextromethorphan-guaiFENesin, methocarbamol, ondansetron **OR** ondansetron  (ZOFRAN) IV, oxyCODONE  Allergies:    Allergies  Allergen Reactions   Penicillins Other (See Comments)    Unsure of reaction Did it involve swelling of the face/tongue/throat, SOB, or low BP? Unknown Did it involve sudden or severe rash/hives, skin peeling, or any reaction on the inside of your mouth or nose? Unknown Did you need to seek medical attention at a hospital or doctor's office? Unknown When did it last happen?[place] If all above answers are NO, may proceed with cephalosporin use.    Social History:   Social History   Socioeconomic History   Marital status: Married    Spouse name: Not on file   Number of children: Not on file   Years of education: Not on file   Highest education level: Not on file  Occupational History   Not on file  Social Needs   Financial resource strain: Not on file   Food insecurity    Worry: Not on file    Inability: Not on file   Transportation needs    Medical: Not on file    Non-medical: Not on file  Tobacco Use   Smoking status: Current Every Day Smoker   Smokeless tobacco: Never Used  Substance and Sexual Activity   Alcohol use: Yes   Drug use: Yes    Types: Marijuana   Sexual activity: Not on file  Lifestyle   Physical activity    Days per week: Not on file    Minutes per session: Not on file   Stress: Not on file  Relationships   Social connections    Talks on phone: Not on file    Gets together: Not on file    Attends religious service: Not on file    Active member of club or organization: Not on file    Attends meetings of clubs or organizations: Not on file    Relationship status: Not on file   Intimate partner violence    Fear of current or ex partner: Not on file    Emotionally abused: Not on file    Physically abused: Not on file    Forced sexual activity: Not on file  Other Topics Concern   Not on file  Social History Narrative   Not on file     Family History:    Family  History  Problem Relation Age of Onset   Heart disease Mother     On my interview, the patient denies any family history of heart disease. Previously listed in the chart her mother had heart disease.   Review of Systems    General:  No chills, fever, night sweats or weight changes.  Cardiovascular:  No chest pain,edema, orthopnea,  paroxysmal nocturnal dyspnea. Positive for dyspnea and palpitations.  Dermatological: No rash, lesions/masses. Positive for jaundice.  Respiratory: No cough, dyspnea Urologic: No hematuria, dysuria Abdominal:   No nausea, vomiting, diarrhea, bright red blood per rectum, melena, or hematemesis Neurologic:  No visual changes. Positive for changes in mental status. All other  systems reviewed and are otherwise negative except as noted above.  Physical Exam/Data    Vitals:   08/16/19 2354 08/17/19 0349 08/17/19 0554 08/17/19 0802  BP: (!) 118/91 118/88 107/82 119/87  Pulse: (!) 101 98 82 95  Resp: 16 15    Temp: 97.6 F (36.4 C) 97.6 F (36.4 C) 97.6 F (36.4 C) 97.6 F (36.4 C)  TempSrc: Oral Oral Oral Oral  SpO2: 100% 97% 100% 100%  Weight:      Height:        Intake/Output Summary (Last 24 hours) at 08/17/2019 1106 Last data filed at 08/17/2019 1044 Gross per 24 hour  Intake 2313.44 ml  Output 1 ml  Net 2312.44 ml   Filed Weights   08/15/19 2345  Weight: 81.6 kg   Body mass index is 29.95 kg/m.   General: Pleasant, Caucasian female appearing in NAD Psych: Normal affect. Neuro: Alert and oriented X 3. Moves all extremities spontaneously. Unable to elaborate on some history questions.  HEENT: Jaundice noted.   Neck: Supple without bruits or JVD.  Lungs:  Resp regular and unlabored, decreased along bases bilaterally. Heart: RRR no s3, s4, or murmurs. Abdomen: Soft, non-tender, non-distended, BS + x 4.  Extremities: No clubbing, cyanosis or edema. Petechiae noted. DP/PT/Radials 1+ and equal bilaterally. Discoloration along right foot  (currently wrapped).   EKG:  The EKG was personally reviewed and demonstrates:NSR, HR 73, with PVC's and LVH with diffuse TWI along inferior and lateral leads (no prior tracings available for comparison).   Labs/Studies     Relevant CV Studies:  Echocardiogram: 08/16/2019 IMPRESSIONS   1. Left ventricular ejection fraction, by visual estimation, is approximately 15-20%. Mildly increased left ventricular size. There is borderline left ventricular hypertrophy.  2. Large, fixed thrombus on the apical septal wall of the left ventricle measuring 3.1 x 1.4 cm.  3. The average left ventricular global longitudinal strain is -4.0 %.  4. Left ventricular diastolic Doppler parameters are consistent with pseudonormalization pattern of LV diastolic filling.  5. Left atrial size was moderately dilated.  6. Mild to moderate mitral annular calcification.  7. The mitral valve is degenerative. Mild to moderate mitral valve regurgitation.  8. The tricuspid valve is grossly normal. Tricuspid valve regurgitation is mild.  9. The pulmonic valve was grossly normal. Pulmonic valve regurgitation is trivial by color flow Doppler. 10. Mildly elevated pulmonary artery systolic pressure. 11. The aortic valve has an indeterminant number of cusps, possibly functionally bicuspid. Aortic valve regurgitation is trivial by color flow Doppler. Mild aortic valve sclerosis without stenosis. 12. Right atrial size was normal. 13. Multiple segmental abnormalities exist. See findings. 14. The inferior vena cava is dilated in size with >50% respiratory variability, suggesting right atrial pressure of 8 mmHg. 15. Global right ventricle has severely reduced systolic function.The right ventricular size is normal. No increase in right ventricular wall thickness.  FINDINGS  Left Ventricle: Left ventricular ejection fraction, by visual estimation, is 20%. The left ventricle has severely decreased function. The average left  ventricular global longitudinal strain is -4.0 %. There is borderline left ventricular hypertrophy.  Mildly increased left ventricular size. Spectral Doppler shows Left ventricular diastolic Doppler parameters are consistent with pseudonormalization pattern of LV diastolic filling. There is a large, fixed, apical left ventricular thrombus.  Laboratory Data:  Chemistry Recent Labs  Lab 08/15/19 2350 08/17/19 0439  NA 133* 130*  K 4.1 4.4  CL 97* 100  CO2 21* 18*  GLUCOSE 119* 135*  BUN 47*  41*  CREATININE 1.64* 1.30*  CALCIUM 8.7* 8.0*  GFRNONAA 35* 46*  GFRAA 41* 54*  ANIONGAP 15 12    Recent Labs  Lab 08/15/19 2350 08/17/19 0439  PROT 7.7 6.5  ALBUMIN 2.4* 1.9*  AST 59* 54*  ALT 43 39  ALKPHOS 83 76  BILITOT 12.7* 12.1*   Hematology Recent Labs  Lab 08/15/19 2350 08/17/19 0439  WBC 9.7 12.0*  RBC 4.82 4.51  HGB 16.9* 15.8*  HCT 48.6* 45.8  MCV 100.8* 101.6*  MCH 35.1* 35.0*  MCHC 34.8 34.5  RDW 20.8* 19.9*  PLT 134* 106*   Cardiac EnzymesNo results for input(s): TROPONINI in the last 168 hours. No results for input(s): TROPIPOC in the last 168 hours.  BNPNo results for input(s): BNP, PROBNP in the last 168 hours.  DDimer No results for input(s): DDIMER in the last 168 hours.  Radiology/Studies:  Ct Abdomen Pelvis Wo Contrast  Result Date: 08/16/2019 CLINICAL DATA:  Shortness of breath for 1 week.  Jaundice. EXAM: CT ABDOMEN AND PELVIS WITHOUT CONTRAST TECHNIQUE: Multidetector CT imaging of the abdomen and pelvis was performed following the standard protocol without IV contrast. COMPARISON:  None. FINDINGS: Lower chest: Moderate right pleural effusion. Small left pleural effusion. Patchy airspace opacities in the lung bases consistent with atelectasis, pneumonia or a combination. A component of pneumonia in the left lower lobe is suspected. Moderate heart enlargement. Hepatobiliary: No focal liver abnormality is seen. No gallstones, gallbladder wall thickening,  or biliary dilatation. Pancreas: Unremarkable. No pancreatic ductal dilatation or surrounding inflammatory changes. Spleen: Normal in size without focal abnormality. Adrenals/Urinary Tract: No adrenal masses. Kidneys normal size, orientation and position. No renal masses, stones or hydronephrosis. Normal ureters. Normal bladder. Stomach/Bowel: Stomach is within normal limits. Appendix appears normal. No evidence of bowel wall thickening, distention, or inflammatory changes. Vascular/Lymphatic: Aortic atherosclerosis. No aneurysm. No enlarged lymph nodes. Reproductive: Uterus and bilateral adnexa are unremarkable. Other: No abdominal wall hernia or abnormality. No abdominopelvic ascites. Musculoskeletal: Fracture of the upper L1 vertebra with mild endplate depression. This appears recent. No other fractures. No bone lesions. IMPRESSION: 1. Moderate cardiomegaly. Moderate-sized right pleural effusion. Small left pleural effusion. 2. Patchy lung base airspace opacities most prominent in the left lower lobe. Consider pneumonia if there are consistent clinical findings. Findings could be due to atelectasis. 3. Recent appearing nondisplaced fracture the upper L1 vertebra with mild depression of the upper endplate. 4. No other acute findings within the abdomen or pelvis. No evidence of bile duct dilation. No liver mass or abnormal attenuation. Cause of jaundice not evident. 5. Aortic atherosclerosis. Electronically Signed   By: Amie Portlandavid  Ormond M.D.   On: 08/16/2019 07:13   Ct Head Wo Contrast  Result Date: 08/16/2019 CLINICAL DATA:  Encephalopathy EXAM: CT HEAD WITHOUT CONTRAST TECHNIQUE: Contiguous axial images were obtained from the base of the skull through the vertex without intravenous contrast. COMPARISON:  None. FINDINGS: Brain: Focal hypoattenuation within the right frontal lobe, within the anterior cerebral artery distribution. No intracranial hemorrhage or extra-axial collection. No midline shift, other mass  effect or hydrocephalus. Vascular: No abnormal hyperdensity of the major intracranial arteries or dural venous sinuses. No intracranial atherosclerosis. Skull: The visualized skull base, calvarium and extracranial soft tissues are normal. Sinuses/Orbits: No fluid levels or advanced mucosal thickening of the visualized paranasal sinuses. No mastoid or middle ear effusion. The orbits are normal. IMPRESSION: 1. Focal hypoattenuation within the paramedian right frontal lobe, likely indicating late acute to subacute infarct. MRI may be helpful  for better temporal characterization. 2. No hemorrhage or mass effect. Electronically Signed   By: Deatra Robinson M.D.   On: 08/16/2019 06:57   Mr Angio Head Wo Contrast  Result Date: 08/16/2019 CLINICAL DATA:  Stroke follow-up EXAM: MRA HEAD WITHOUT CONTRAST TECHNIQUE: Angiographic images of the Circle of Willis were obtained using MRA technique without intravenous contrast. COMPARISON:  Brain MRI 08/16/2019 FINDINGS: The study is markedly motion degraded. POSTERIOR CIRCULATION: --Vertebral arteries: Normal V4 segments. --Posterior inferior cerebellar arteries (PICA): Patent origins from the vertebral arteries. --Anterior inferior cerebellar arteries (AICA): Patent origins from the basilar artery. --Basilar artery: Normal. --Superior cerebellar arteries: Normal. --Posterior cerebral arteries: Normal. The left PCA is predominantly supplied by the posterior communicating artery. ANTERIOR CIRCULATION: --Intracranial internal carotid arteries: Normal. --Anterior cerebral arteries (ACA): Normal. Both A1 segments are present. Patent anterior communicating artery (a-comm). --Middle cerebral arteries (MCA): Normal. IMPRESSION: 1. Markedly motion degraded examination. 2. No emergent large vessel occlusion or high-grade proximal stenosis. Electronically Signed   By: Deatra Robinson M.D.   On: 08/16/2019 23:37   Mr Angio Neck Wo Contrast  Result Date: 08/16/2019 CLINICAL DATA:  Stroke  follow-up EXAM: MRA NECK WITHOUT CONTRAST TECHNIQUE: Multiplanar and multiecho pulse sequences of the neck were obtained without intravenous contrast. Angiographic images of the neck were obtained using time-of-flight MRA technique. CONTRAST:  None COMPARISON:  None. FINDINGS: Study is degraded by motion and the lack of intravenous contrast material. The visualized portions of the common carotid arteries are normal. The V2 segments of both vertebral arteries are patent. There is limited visualization of the internal carotid arteries. IMPRESSION: Severely motion limited study. The common carotid arteries and vertebral artery V2 segments are patent. Visualization of the internal carotid arteries is markedly limited. Consider repeat examination with contrast when the patient is better able to follow instructions. Electronically Signed   By: Deatra Robinson M.D.   On: 08/16/2019 23:49   Mr Brain Wo Contrast  Result Date: 08/16/2019 CLINICAL DATA:  TIA. Altered mental status and generalized weakness. EXAM: MRI HEAD WITHOUT CONTRAST TECHNIQUE: Multiplanar, multiecho pulse sequences of the brain and surrounding structures were obtained without intravenous contrast. COMPARISON:  Head CT 08/16/2019 FINDINGS: Brain: An approximately 3.5 cm wedge-shaped region of T2 hyperintensity, trace diffusion hyperintensity, and facilitated diffusion in the medial right frontal lobe involves white matter and cortex and is most consistent with a subacute ACA territory infarct. A 3 mm focus of diffusion abnormality involving right parietal white matter also likely reflects a subacute infarct. Low level diffusion abnormality involving the right caudate head may also reflect a subacute infarct. No intracranial hemorrhage, mass effect, or extra-axial fluid collection is identified. The ventricles and sulci are within normal limits for age. Scattered small foci of T2 hyperintensity in the cerebral white matter bilaterally are nonspecific but  compatible with mild chronic small vessel ischemic disease. There are small chronic left cerebellar infarcts. Vascular: Major intracranial vascular flow voids are preserved. Skull and upper cervical spine: Unremarkable bone marrow signal. Sinuses/Orbits: Unremarkable orbits. Paranasal sinuses and mastoid air cells are clear. Other: None. IMPRESSION: 1. 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 2. Suspected small subacute infarcts involving the right caudate nucleus and right parietal white matter. 3. Chronic left cerebellar infarcts. Electronically Signed   By: Sebastian Ache M.D.   On: 08/16/2019 10:48   Mr Brain W Contrast  Result Date: 08/16/2019 CLINICAL DATA:  53 year old female with altered mental status and generalized weakness, multiple subacute infarcts on brain MRI  earlier today. EXAM: MRI HEAD WITH CONTRAST TECHNIQUE: Multiplanar, multiecho pulse sequences of the brain and surrounding structures were obtained with intravenous contrast. CONTRAST:  8mL GADAVIST GADOBUTROL 1 MMOL/ML IV SOLN COMPARISON:  Noncontrast brain MRI earlier today. FINDINGS: Mild gyriform post ischemic appearing enhancement in the anterior right superior frontal gyrus, corresponding to the most confluent area of diffusion signal abnormality earlier today (series 7, image 22). Faint linear post ischemic appearing enhancement also in the white matter of the left middle frontal gyrus, corresponding to subtle diffusion abnormality today). No other abnormal intracranial enhancement.  No dural thickening. The major dural venous sinuses are enhancing and appear to be patent. No intracranial mass effect or ventriculomegaly. IMPRESSION: 1. Gyriform post ischemic appearing enhancement in the right ACA territory, corresponding to most confluent diffusion abnormality earlier today. 2. Faint linear enhancement in the left frontal lobe white matter appears to reflect an area of late subacute ischemia. Electronically Signed   By:  Odessa Fleming M.D.   On: 08/16/2019 16:58   Dg Chest Portable 1 View  Result Date: 08/16/2019 CLINICAL DATA:  Shortness of breath EXAM: PORTABLE CHEST 1 VIEW COMPARISON:  None. FINDINGS: There are multiple areas of increased airspace opacity within both lungs, including the right upper lobe, right lung base and left lung base. There is mild cardiomegaly. No pneumothorax or sizable pleural effusion. IMPRESSION: 1. Multifocal airspace disease in both lungs, which may indicate multifocal infection. 2. Mild cardiomegaly. Electronically Signed   By: Deatra Robinson M.D.   On: 08/16/2019 03:35   Dg Foot Complete Right  Result Date: 08/16/2019 CLINICAL DATA:  Shortness of breath swelling to the right foot EXAM: RIGHT FOOT COMPLETE - 3+ VIEW COMPARISON:  None. FINDINGS: No fracture or malalignment. Moderate to large plantar calcaneal spur. IMPRESSION: No acute osseous abnormality. Electronically Signed   By: Jasmine Pang M.D.   On: 08/16/2019 03:09     Assessment & Plan    1. New Cardiomyopathy/ LV Thrombus - currently admitted with PNA and acute CVA, found to have a newly reduced EF of 15-20% with a large fixed thrombus on the apical septal wall of the left ventricle measuring 3.1 x 1.4 cm. EKG showed NSR, HR 73, with PVC's and LVH with diffuse TWI along inferior and lateral leads (no prior tracings available for comparison). - she denies any prior cardiac history but has not been evaluated by a medical provider in several years.  - she is not a current candidate for a R/LHC at this time in the setting of her acute CVA. Suspect medical therapy would be pursued at this time with plans for catheterization as an outpatient following medical therapy. She has been started on Heparin for her LV Thrombus and will need to be transitioned to oral anticoagulation once it is determined she will not require any invasive procedures this admission (Vascular following and ordering ABI's in the setting of her lower extremity  wounds).  - will discuss timing of BB and ARB with MD as we are currently allowing permissive HTN in the setting of her CVA. She does have rales on examination and CT showed bilateral effusions. Will start Lasix  daily which may need to be titrated pending clinical response. Creatinine improved to 1.30 today.   2. 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 She has been started on Rifaximin. GI following.   3. Acute CVA - Brain MRI confirmed a subacute right frontal lobe infarct in the ACA territory with suspected small subacute infarcts involving the  right caudate nucleus and right parietal white matter. Felt to be cardioembolic and likely secondary to LV thrombus. Neurology following. Recommend initiation of statin therapy once cleared from GI.   4. Multifocal PNA - CXR showed multifocal airspace disease and CT showed patchy opacities along the left lower lobe. Remains on Levaquin.   5. NSVT - episodes of NSVT, up to 19 beats noted on telemetry. K+ 4.4 this AM. Will check Mg. Keep K+ ~ 4.0 and Mg ~ 2.0. Will discuss timing of BB therapy with MD.    For questions or updates, please contact [facility] Please consult [email or web address] for contact info under Cardiology/STEMI.  Signed, Ellsworth LennoxBrittany M Strader, PA-C 08/17/2019, 11:06 AM Pager: [phone]   The patient was seen, examined and discussed with Randall AnBrittany Strader, PA-C and I agree with the above.    Ms. Briana AlpersWebster is a 53 year old female with h/o heavy etoh abuse, tobacco abuse, THC use  who [facility]18 with progressively worsening DOE and jaundice x 1 week, also mental status changes. No prior cardiac history. She is very poor historian, difficult to arouse, falling asleep during conversation, denies any palpitations or syncope at home, denies chest pain.   Labs showed WBC 9.7, Hgb 16.9, platelets 134, Na+ 133, K+ 4.1, creatinine 1.64, AST 59, ALT 43 and total bilirubin 12.7. Lipase WNL. Lactic Acid 2.8. INR 1.7.  Ethanol negative. UDS positive for THC. Lactic Acid 2.8. COVID negative.  EKG showed NSR, HR 73, with PVC's and LVH with diffuse TWI along inferior and lateral leads (no prior tracings available for comparison). CXR showed multifocal airspace disease and mild cardiomegaly. Also pulmonary edema. CT Head showed focal hypoattenuation within the paramedian right frontal lobe, likely indicating late acute to subacute infarct and MRI confirmed a subacute right frontal lobe infarct in the ACA territory with suspected small subacute infarcts involving the right caudate nucleus and right parietal white matter. CT Abdomen with cardiomegaly and moderate right-sized pleural effusion and aortic atherosclerosis.   She was admitted and started on antibiotic therapy for multifocal PNA. Neurology also following in the setting of her CVA which was felt to be cardioembolic in etiology.  On physical exam she is drowsy, JVD + 8 cm B/L, S1,2, + S4, crackles in both lung bases, lower extremities with no edema, poor peripheral pulses, extremities cold. Telemetry shows many runs of nsVT up to 28 beats   Assessment and plan:  End stage biventricular CHF, unknown etiology possible sec to etoh abuse LV thrombus Etoh, tobacco abuse, alcoholic steatosis - started on Rifaximin, lactulose. GI following nsVTs Acute kidney failure Poor functional status  Acute stroke Multifocal pneumonia -  on Levaquin  I have personally reviewed her echocardiogram, LV is severely dilated with LVEF ~ 15% with diffuse hypokinesis, stage 3 diastolic dysfunction with severely elevated LVEDP< at least moderately dilated left atrium, severely decreased right ventricular systolic function.  Based on these findings this is a chronic process, etiology is unknown but highly likely non-ischemic related to etoh abuse. There is a large apical mural non-mobile thrombus. I would continue lasix 40 mg iv BID, I would add low dose spironolactone 12.5 mg po  daily and digoxin 0.125 mg po daily. Avoid BB, start low dose lisinopril tomorrow if BP tolerates it and Crea stable or improving  I would recommend to start full anticoagulation with Eliquis 5 mg PO BID given LV thrombus and CVA. I would appreciate neurology input if safe given acute CVA and risk of  hemorrhage.  She has frequent nsVT and very low LVEF. She will require follow up echo to re-evaluate LVEF in 3 months. Given overall very poor prognosis she is currently not a candidate for an ICD implantation (and LifeVest). If her mental status, functional status changes, we will reconsider.  Will check Mg. Keep K+ ~ 4.0 and Mg  ~ 2.0.  We will follow.  Tobias AlexanderKatarina Rashell Shambaugh, MD 08/17/2019

## 2019-08-17 NOTE — Progress Notes (Signed)
ANTICOAGULATION CONSULT NOTE - Initial Consult  Pharmacy Consult for heparin Indication: stroke and LV thrombus  Allergies  Allergen Reactions  . Penicillins Other (See Comments)    Unsure of reaction Did it involve swelling of the face/tongue/throat, SOB, or low BP? Unknown Did it involve sudden or severe rash/hives, skin peeling, or any reaction on the inside of your mouth or nose? Unknown Did you need to seek medical attention at a hospital or doctor's office? Unknown When did it last happen?[place] If all above answers are "NO", may proceed with cephalosporin use.    Patient Measurements: Height: 5\' 5"  (165.1 cm) Weight: 180 lb (81.6 kg) IBW/kg (Calculated) : 57 Heparin Dosing Weight: 75kg  Vital Signs: Temp: 97.6 F (36.4 C) (09/20 0554) Temp Source: Oral (09/20 0554) BP: 107/82 (09/20 0554) Pulse Rate: 82 (09/20 0554)  Labs: Recent Labs    08/15/19 2350 08/16/19 0016 08/16/19 1030 08/16/19 1948 08/17/19 0439  HGB 16.9*  --   --   --   --   HCT 48.6*  --   --   --   --   PLT 134*  --   --   --   --   APTT  --   --  31  --  33  LABPROT  --  20.1*  --   --  21.6*  INR  --  1.7*  --   --  1.9*  CREATININE 1.64*  --   --   --  1.30*  CKTOTAL  --   --   --  196  --     Estimated Creatinine Clearance: 52.2 mL/min (A) (by C-G formula based on SCr of 1.3 mg/dL (H)).   Medical History: Past Medical History:  Diagnosis Date  . Alcohol abuse   . Tobacco abuse     Medications:  Medications Prior to Admission  Medication Sig Dispense Refill Last Dose  . ibuprofen (ADVIL) 200 MG tablet Take 200 mg by mouth every 6 (six) hours as needed for mild pain.   unknown at prn  . naproxen (NAPROSYN) 500 MG tablet Take 1 tablet (500 mg total) by mouth 2 (two) times daily with a meal. (Patient not taking: Reported on 08/16/2019) 30 tablet 0 Not Taking at Unknown time  . traMADol (ULTRAM) 50 MG tablet Take 1 tablet (50 mg total) by mouth every 6 (six) hours as needed.  (Patient not taking: Reported on 08/16/2019) 15 tablet 0 Not Taking at Unknown time   Scheduled:  . folic acid  1 mg Oral Daily  . multivitamin with minerals  1 tablet Oral Daily  . nicotine  21 mg Transdermal Daily  . rifaximin  550 mg Oral BID   Infusions:  . dextrose 5 % and 0.9% NaCl 100 mL/hr at 08/17/19 0116  . levofloxacin (LEVAQUIN) IV Stopped (08/16/19 0915)  . thiamine injection Stopped (08/16/19 1217)    Assessment: 53yo female admitted as code stroke, found to have subacute stroke/infarcts w/ suspected cardioembolic source >> echo reveals LV thrombus, to begin heparin.  Goal of Therapy:  Heparin level 0.3-0.5 units/ml Monitor platelets by anticoagulation protocol: Yes   Plan:  Will begin heparin gtt at 1000 units/hr and monitor heparin levels and CBC.  Wynona Neat, PharmD, BCPS  08/17/2019,7:16 AM

## 2019-08-17 NOTE — Progress Notes (Signed)
   08/17/19 1134  Output (mL)  Urine 200 mL  Urine Characteristics  Urine Color Orange  Urine Appearance Clear  Bladder Scan Volume (mL) 226 mL    Post void residual 226cc. Will continue to monitor.

## 2019-08-18 DIAGNOSIS — I5021 Acute systolic (congestive) heart failure: Secondary | ICD-10-CM

## 2019-08-18 DIAGNOSIS — I743 Embolism and thrombosis of arteries of the lower extremities: Secondary | ICD-10-CM

## 2019-08-18 LAB — COMPREHENSIVE METABOLIC PANEL
ALT: 38 U/L (ref 0–44)
AST: 69 U/L — ABNORMAL HIGH (ref 15–41)
Albumin: 1.7 g/dL — ABNORMAL LOW (ref 3.5–5.0)
Alkaline Phosphatase: 75 U/L (ref 38–126)
Anion gap: 13 (ref 5–15)
BUN: 39 mg/dL — ABNORMAL HIGH (ref 6–20)
CO2: 19 mmol/L — ABNORMAL LOW (ref 22–32)
Calcium: 7.9 mg/dL — ABNORMAL LOW (ref 8.9–10.3)
Chloride: 100 mmol/L (ref 98–111)
Creatinine, Ser: 1.48 mg/dL — ABNORMAL HIGH (ref 0.44–1.00)
GFR calc Af Amer: 46 mL/min — ABNORMAL LOW (ref >60)
GFR calc non Af Amer: 40 mL/min — ABNORMAL LOW (ref >60)
Glucose, Bld: 83 mg/dL (ref 70–99)
Potassium: 4.3 mmol/L (ref 3.5–5.1)
Sodium: 132 mmol/L — ABNORMAL LOW (ref 135–145)
Total Bilirubin: 11.8 mg/dL — ABNORMAL HIGH (ref 0.3–1.2)
Total Protein: 5.9 g/dL — ABNORMAL LOW (ref 6.5–8.1)

## 2019-08-18 LAB — HEPARIN LEVEL (UNFRACTIONATED)
Heparin Unfractionated: 0.16 IU/mL — ABNORMAL LOW (ref 0.30–0.70)
Heparin Unfractionated: 0.21 IU/mL — ABNORMAL LOW (ref 0.30–0.70)
Heparin Unfractionated: 0.31 IU/mL (ref 0.30–0.70)

## 2019-08-18 LAB — HOMOCYSTEINE: Homocysteine: 12.8 umol/L (ref 0.0–14.5)

## 2019-08-18 LAB — CBC
HCT: 42.1 % (ref 36.0–46.0)
Hemoglobin: 14.5 g/dL (ref 12.0–15.0)
MCH: 35 pg — ABNORMAL HIGH (ref 26.0–34.0)
MCHC: 34.4 g/dL (ref 30.0–36.0)
MCV: 101.7 fL — ABNORMAL HIGH (ref 80.0–100.0)
Platelets: 110 10*3/uL — ABNORMAL LOW (ref 150–400)
RBC: 4.14 MIL/uL (ref 3.87–5.11)
RDW: 19.9 % — ABNORMAL HIGH (ref 11.5–15.5)
WBC: 12.2 10*3/uL — ABNORMAL HIGH (ref 4.0–10.5)
nRBC: 2.5 % — ABNORMAL HIGH (ref 0.0–0.2)

## 2019-08-18 LAB — ANTI-SMITH ANTIBODY: ENA SM Ab Ser-aCnc: <0.2 AI (ref 0.0–0.9)

## 2019-08-18 LAB — LEGIONELLA PNEUMOPHILA SEROGP 1 UR AG: L. pneumophila Serogp 1 Ur Ag: NEGATIVE

## 2019-08-18 LAB — RHEUMATOID FACTOR: Rheumatoid fact SerPl-aCnc: <10 IU/mL (ref 0.0–13.9)

## 2019-08-18 LAB — MPO/PR-3 (ANCA) ANTIBODIES
ANCA Proteinase 3: <3.5 U/mL (ref 0.0–3.5)
Myeloperoxidase Abs: <9 U/mL (ref 0.0–9.0)

## 2019-08-18 LAB — MAGNESIUM: Magnesium: 1.9 mg/dL (ref 1.7–2.4)

## 2019-08-18 LAB — PROTIME-INR
INR: 1.6 — ABNORMAL HIGH (ref 0.8–1.2)
Prothrombin Time: 19.2 seconds — ABNORMAL HIGH (ref 11.4–15.2)

## 2019-08-18 LAB — ANTI-DNA ANTIBODY, DOUBLE-STRANDED: ds DNA Ab: <1 IU/mL (ref 0–9)

## 2019-08-18 LAB — FOLATE RBC
Folate, Hemolysate: 456 ng/mL
Folate, RBC: 1025 ng/mL (ref >498)
Hematocrit: 44.5 % (ref 34.0–46.6)

## 2019-08-18 LAB — ANA: Anti Nuclear Antibody (ANA): NEGATIVE

## 2019-08-18 LAB — AMMONIA: Ammonia: 42 umol/L — ABNORMAL HIGH (ref 9–35)

## 2019-08-18 NOTE — Progress Notes (Signed)
ANTICOAGULATION CONSULT NOTE - Follow Up Consult  Pharmacy Consult for Heparin Indication: DVT, LV thrombus, and acute CVA  Allergies  Allergen Reactions  . Penicillins Other (See Comments)    Unsure of reaction Did it involve swelling of the face/tongue/throat, SOB, or low BP? Unknown Did it involve sudden or severe rash/hives, skin peeling, or any reaction on the inside of your mouth or nose? Unknown Did you need to seek medical attention at a hospital or doctor's office? Unknown When did it last happen?[place] If all above answers are "NO", may proceed with cephalosporin use.    Patient Measurements: Height: 5\' 5"  (165.1 cm) Weight: 180 lb (81.6 kg) IBW/kg (Calculated) : 57 Heparin Dosing Weight: 75 kg  Vital Signs: Temp: 98.1 F (36.7 C) (09/21 2032) Temp Source: Oral (09/21 2032) BP: 110/66 (09/21 2032) Pulse Rate: 85 (09/21 2032)  Labs: Recent Labs    08/15/19 2350 08/16/19 0016 08/16/19 1030 08/16/19 1249 08/16/19 1948 08/17/19 0439  08/18/19 0407 08/18/19 1255 08/18/19 2108  HGB 16.9*  --   --   --   --  15.8*  --  14.5  --   --   HCT 48.6*  --   --  44.5  --  45.8  --  42.1  --   --   PLT 134*  --   --   --   --  106*  --  110*  --   --   APTT  --   --  31  --   --  33  --   --   --   --   LABPROT  --  20.1*  --   --   --  21.6*  --  19.2*  --   --   INR  --  1.7*  --   --   --  1.9*  --  1.6*  --   --   HEPARINUNFRC  --   --   --   --   --   --    < > 0.16* 0.21* 0.31  CREATININE 1.64*  --   --   --   --  1.30*  --  1.48*  --   --   CKTOTAL  --   --   --   --  196  --   --   --   --   --    < > = values in this interval not displayed.    Estimated Creatinine Clearance: 45.8 mL/min (A) (by C-G formula based on SCr of 1.48 mg/dL (H)).   Medications:  Scheduled:  . digoxin  0.125 mg Oral Daily  . folic acid  1 mg Oral Daily  . furosemide  40 mg Intravenous Daily  . lactulose  10 g Oral BID  . levofloxacin  750 mg Oral Q48H  . multivitamin  with minerals  1 tablet Oral Daily  . nicotine  21 mg Transdermal Daily  . rifaximin  550 mg Oral BID  . spironolactone  12.5 mg Oral Daily   Infusions:  . dextrose 5 % and 0.9% NaCl 100 mL/hr at 08/18/19 1600  . heparin 1,700 Units/hr (08/18/19 1955)  . thiamine injection 500 mg (08/18/19 1131)    Assessment: [name] on heparin for LV thrombus, DVT, concern for PAD with plans for abdominal aortogram 9/22.  Heparin therapy complicated by acute CVA and need for lower goal.   Heparin level at low end of goal (0.31) on 1700  units/hr.  Goal of Therapy:  Heparin level 0.3-0.5 units/ml Monitor platelets by anticoagulation protocol: Yes   Plan:  Increase heparin infusion to 1800 units/hr Next heparin level with AM labs. Daily heparin level and CBC while on heparin.  [employer], Pharm.D., BCPS Clinical Pharmacist  **Pharmacist phone directory can now be found on [email or web address] ([email or web address]).  Listed under [facility].  08/18/2019 9:59 PM

## 2019-08-18 NOTE — Progress Notes (Signed)
STROKE TEAM PROGRESS NOTE   INTERVAL HISTORY Pt is lying in bed. Awake alert, complaining of right foot pain. She had right foot coldness and discoloration. VVS on board, plan to do aortogram tomorrow  OBJECTIVE Vitals:   08/17/19 1640 08/17/19 2042 08/17/19 2317 08/18/19 0313  BP: (!) 132/100 (!) 135/104 (!) 128/102 (!) 135/93  Pulse: 93 91 93 92  Resp:  15 16 15   Temp:  97.7 F (36.5 C) 97.9 F (36.6 C)   TempSrc: Oral Oral Oral   SpO2: 100% 100% 98% 100%  Weight:      Height:        CBC:  Recent Labs  Lab 08/15/19 2350 08/17/19 0439 08/18/19 0407  WBC 9.7 12.0* 12.2*  NEUTROABS 7.7  --   --   HGB 16.9* 15.8* 14.5  HCT 48.6* 45.8 42.1  MCV 100.8* 101.6* 101.7*  PLT 134* 106* 110*    Basic Metabolic Panel:  Recent Labs  Lab 08/17/19 0439 08/18/19 0407  NA 130* 132*  K 4.4 4.3  CL 100 100  CO2 18* 19*  GLUCOSE 135* 83  BUN 41* 39*  CREATININE 1.30* 1.48*  CALCIUM 8.0* 7.9*  MG  --  1.9    Lipid Panel:     Component Value Date/Time   CHOL 93 08/17/2019 0439   TRIG 177 (H) 08/17/2019 0439   HDL <10 (L) 08/17/2019 0439   CHOLHDL NOT CALCULATED 08/17/2019 0439   VLDL 35 08/17/2019 0439   LDLCALC NOT CALCULATED 08/17/2019 0439   HgbA1c:  Lab Results  Component Value Date   HGBA1C 5.8 (H) 08/17/2019   Urine Drug Screen:     Component Value Date/Time   LABOPIA NONE DETECTED 08/16/2019 0930   COCAINSCRNUR NONE DETECTED 08/16/2019 0930   LABBENZ NONE DETECTED 08/16/2019 0930   AMPHETMU NONE DETECTED 08/16/2019 0930   THCU POSITIVE (A) 08/16/2019 0930   LABBARB NONE DETECTED 08/16/2019 0930    Alcohol Level     Component Value Date/Time   ETH <10 08/16/2019 0228    IMAGING  Ct Abdomen Pelvis Wo Contrast 08/16/2019 IMPRESSION:  1. Moderate cardiomegaly. Moderate-sized right pleural effusion. Small left pleural effusion.  2. Patchy lung base airspace opacities most prominent in the left lower lobe. Consider pneumonia if there are consistent  clinical findings. Findings could be due to atelectasis.  3. Recent appearing nondisplaced fracture the upper L1 vertebra with mild depression of the upper endplate.  4. No other acute findings within the abdomen or pelvis. No evidence of bile duct dilation. No liver mass or abnormal attenuation. Cause of jaundice not evident.  5. Aortic atherosclerosis.   Ct Head Wo Contrast 08/16/2019 IMPRESSION:  1. Focal hypoattenuation within the paramedian right frontal lobe, likely indicating late acute to subacute infarct. MRI may be helpful for better temporal characterization.  2. No hemorrhage or mass effect.  Mr Angio Head Wo Contrast 08/16/2019 IMPRESSION:  1. Markedly motion degraded examination.  2. No emergent large vessel occlusion or high-grade proximal stenosis.   Mr Angio Neck Wo Contrast 08/16/2019 IMPRESSION:  Severely motion limited study. The common carotid arteries and vertebral artery V2 segments are patent. Visualization of the internal carotid arteries is markedly limited. Consider repeat examination with contrast when the patient is better able to follow instructions.   Mr Brain Wo Contrast 08/17/2019   ADDENDUM:  Not mentioned in the original report is a subcentimeter linear focus of subtle diffusion abnormality in the subcortical white matter of the left middle frontal gyrus  corresponding to faint enhancement on subsequent postcontrast brain MRI and also likely reflecting a subacute infarct.  08/17/2019 IMPRESSION:  1. 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  2. Suspected small subacute infarcts involving the right caudate nucleus and right parietal white matter.  3. Chronic left cerebellar infarcts.  Mr Laqueta JeanBrain W Contrast 08/16/2019 IMPRESSION:  1. Gyriform post ischemic appearing enhancement in the right ACA territory, corresponding to most confluent diffusion abnormality earlier today.  2. Faint linear enhancement in the left frontal lobe white matter  appears to reflect an area of late subacute ischemia.   Dg Chest Portable 1 View 08/16/2019 IMPRESSION:  1. Multifocal airspace disease in both lungs, which may indicate multifocal infection.  2. Mild cardiomegaly.   Dg Foot Complete Right 08/16/2019 IMPRESSION:  No acute osseous abnormality.   [place] Carotid 08/17/2019 Summary:  Right Carotid: Velocities in the right ICA are consistent with a 1-39% stenosis. Left Carotid: Velocities in the left ICA are consistent with a 1-39% stenosis.   Preliminary    [place] Lower Extremity Arterial Duplex 08/17/2019  Summary:  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 Incidental finding Right popliteal vein DVT.  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     Preliminary     Transthoracic Echocardiogram  08/16/2019 MPRESSIONS  1. Left ventricular ejection fraction, by visual estimation, is approximately 15-20%. Mildly increased left ventricular size. There is borderline left ventricular hypertrophy.  2. Large, fixed thrombus on the apical septal wall of the left ventricle measuring 3.1 x 1.4 cm.  3. The average left ventricular global longitudinal strain is -4.0 %.  4. Left ventricular diastolic Doppler parameters are consistent with pseudonormalization pattern of LV diastolic filling.  5. Left atrial size was moderately dilated.  6. Mild to moderate mitral annular calcification.  7. The mitral valve is degenerative. Mild to moderate mitral valve regurgitation.  8. The tricuspid valve is grossly normal. Tricuspid valve regurgitation is mild.  9. The pulmonic valve was grossly normal. Pulmonic valve regurgitation is trivial by color flow Doppler. 10. Mildly elevated pulmonary artery systolic pressure. 11. The aortic valve has an indeterminant number of cusps, possibly functionally bicuspid. Aortic valve regurgitation is trivial by color flow Doppler. Mild aortic valve sclerosis without stenosis. 12. Right atrial size was  normal. 13. Multiple segmental abnormalities exist. See findings. 14. The inferior vena cava is dilated in size with >50% respiratory variability, suggesting right atrial pressure of 8 mmHg. 15. Global right ventricle has severely reduced systolic function.The right ventricular size is normal. No increase in right ventricular wall thickness.   ECG - SR rate  93 BPM. (See cardiology reading for complete details)   PHYSICAL EXAM  Temp:  [97.6 F (36.4 C)-98.2 F (36.8 C)] 97.9 F (36.6 C) (09/20 2317) Pulse Rate:  [91-95] 92 (09/21 0313) Resp:  [15-18] 15 (09/21 0313) BP: (119-138)/(87-109) 135/93 (09/21 0313) SpO2:  [98 %-100 %] 100 % (09/21 0313)  General - Well nourished, well developed, in no apparent distress, but mildly lethargic.  Ophthalmologic - fundi not visualized due to noncooperation.  Cardiovascular - Regular rate and rhythm.  Mental Status -  Level of arousal and orientation to time, place, and person were intact. Language including expression, naming, repetition, comprehension was assessed and found intact.  Cranial Nerves II - XII - II - Visual field intact OU.  III, IV, VI - Extraocular movements intact.  Sclera jaundice V - Facial sensation intact bilaterally. VII - Facial movement  intact bilaterally. VIII - Hearing & vestibular intact bilaterally. X - Palate elevates symmetrically. XI - Chin turning & shoulder shrug intact bilaterally. XII - Tongue protrusion intact.  Motor Strength - The patient's strength was 4/5 in all extremities and pronator drift was absent.  Bulk was normal and fasciculations were absent.   Motor Tone - Muscle tone was assessed at the neck and appendages and was normal.  Reflexes - The patient's reflexes were symmetrical in all extremities and she had no pathological reflexes.  Sensory - Light touch, temperature/pinprick were assessed and were symmetrical.    Coordination - The patient had normal movements in the hands with no  ataxia or dysmetria.  Tremor was absent.  Gait and Station - deferred.   ASSESSMENT/PLAN Ms. Briana Andrews is a 53 y.o. female with history of tobacco and Etoh abuse presenting with confusion, intermittent SOB, whole body weakness for several weeks and a recent fall.   She did not receive IV t-PA due to late presentation.  Stroke - multiple right ACA patchy infarcts and left frontal linear small subacute infarct - cardioembolic due to low EF and large LV thrombus.   CT head - Focal hypoattenuation within the paramedian right frontal lobe, likely indicating late acute to subacute infarct.  MRI head with and without contrast right ACA, ACA/MCA, caudate head acute infarcts and left frontal linear small subacute infarct. Chronic left cerebellar infarcts.  MRA head and neck - motion degraded but no LVO  Carotid Doppler - both carotids 1-39%  2D Echo - EF 15 - 20%. Large, fixed thrombus on the apical septal wall of the left ventricle measuring 3.1 x 1.4 cm.  [employer] Virus 2 - negative  LDL - not calculated due to low HDL  HgbA1c - 5.8  Hypercoagulable and autoimmune labs so far negative but some are still pending  UDS - THCU  VTE prophylaxis - IV heparin  No antithrombotic prior to admission, now on heparin IV.   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   Right foot and toes discoloration and coldness  VVS on board, plan for aortogram tomorrow  Likely related to LV thrombus  LE DVT  Incidental finding Rt  popliteal vein DVT.  Likely hypercoagulable state  On heparin IV  Alcoholic cirrhosis  Coagulopathy, INR 1.7-1.9->1.6  Hyperammonemia, ammonia level 39->42  AST/ALT 54/39->69/38  Thrombocytopenia platelet 134-106->110  GI on board  Elevated bilirubin 12.7-12.1->11.8  On rifaximin and Levaquin  On folic acid, B1, multivitamin  CIWA protocol  Tobacco abuse  Current smoker  Smoking cessation counseling provided  Nicotine patch provided  Pt is willing to quit  Other Stroke Risk Factors  Obesity, Body mass index is 29.95 kg/m., recommend weight loss, diet and exercise as appropriate   Hx stroke/TIA by imaging  Other Active Problems  L1 compression fx  Probable pneumonia  CKD stage II creatinine - 1.64->1.30->1.48  Hyponatremia - 133->130->132  Leukocytosis - 9.7->12.0 (afebrile)->12.2  Polycythemia - Hb - 16.9->15.8, likely secondary to smoking->14.5  Hospital day # 2  Neurology will sign  off. Please call with questions. Pt will follow up with stroke clinic NP at [facility] in about 4 weeks. Thanks for the consult.  Rosalin Hawking, MD PhD Stroke Neurology 08/18/2019 4:59 PM   To contact Stroke Continuity provider, please refer to [email or web address] After hours, contact General Neurology

## 2019-08-18 NOTE — Progress Notes (Signed)
PROGRESS NOTE    Briana Andrews  DEY:[id number] DOB: 1966/06/15 DOA: 08/15/2019 PCP: Patient, [name]    Brief Narrative:   53 year old female with history of smoking, alcohol use, she drinks more than 6 packs of beer every day since last 20 years, no chronic medical issues brought to the emergency room with confusion more than usual and falls. According to the patient's husband, she has been doing well until this summer.  Since last few months there have been noticing some changes on her including weakness on her legs and poor appetite as much as she stopped smoking and decreased her drinking.  2 weeks ago, she was taken to [facility] with a fall, skeletal survey was negative except L1 fracture with 25% height loss and discharged home on muscle relaxants.  Patient has been intermittently confused since then.  After coming to the emergency room she was found with multiple problems as below. In the emergency room, she was found with subacute right ACA stroke, jaundice with bilirubin of 12, bilateral multifocal pneumonia and metabolic encephalopathy and bilateral ischemic legs.   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  Also suspect noninfectious cause, may be related to CHF. Chest x-ray consistent with multiple area opacities.  WBC count was normal.  Lactic acid was elevated, however also has liver disease.  Started on Levaquin that we will continue for 5 days total. Cultures are negative. Oxygen as needed.   Bronchodilator as needed.  Subacute ischemic stroke, right ACA with encephalopathy: Continue Neurochecks and vital signs as per stroke protocol. Patient was not a TPA and vascular intervention candidate because of unknown duration of symptoms. Passed bedside swallow test, on diet. Statin, contraindicated due to liver disease. Consultations, neurology, speech, PT OT MRI of the brain shows right ACA territory stroke, no hemorrhage. carotid ultrasound, no thrombosis.  Less than 39% stenosis. 2D echocardiogram, ejection fraction 15% with left ventricular thrombus. Lipid panel and A1c normal. Starting patient on heparin as she also has evidence of systemic thromboembolism.  Abnormal LFTs: Suspect chronic alcoholic liver disease.  Transaminases are minimally elevated.  CT scan abdomen with no evidence of biliary abnormalities.  Symptomatic treatment with IV fluids.  Less likely hemochromatosis. Seen by GI, appreciate their input.  Started on rifaximin and lactulose. May have component of acute alcoholic hepatitis. Duplex hepatic system negative for hepatic portal or splenic venous thrombosis or occlusion.  Bilateral lower leg ischemia : Severe peripheral arterial disease.  Likely systemic thromboembolism.  Arterial scan showed absent flow bilateral popliteal artery.  Seen by vascular surgery. Now on heparin. Plan as per vascualr surgery. Overall in poor clinical situation.  Alcoholism / encephalopathy:  Patient is at risk of withdrawal, however she is too lethargic and sick looking.  Treating with high-dose thiamine, will continue for 5 days.    New diagnosed acute systolic heart failure, left ventricular thrombus: Ejection fraction 15%.  Patient is not clinically stable to undergo ischemic evaluation or cardiac catheterization.  Seen by cardiology, started on  IV Lasix.  Symptomatic treatment.  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  Acute urinary retention: Patient continues to retain urine, more  than 600 mL on bladder scan.  Needing close monitoring up intake and output.  Will insert Foley catheter.  I have met patient's husband at the bedside on 08/17/2019, discussed in details and updated about all conditions going on including very serious medical conditions and prolonged recovery as well possible difficult recovery.  Family is well aware about ongoing conditions.  DVT prophylaxis: heparin infusion. Code Status: Full code Family Communication: Husband Disposition Plan: Unknown at this time. Critically sick.  May go to acute rehab.   Consultants:   Gastroenterology  Neurology  Vascular surgery  Procedures:   None  Antimicrobials:   Levaquin, 08/16/2019---   Subjective: Patient seen and examined.  Sick looking.  Alert and awake x4 for today.  Patient complained of difficulty urinating and unable to urinate that hurts.  Denies any nausea vomiting.  Appetite is poor.  Afebrile.  Short of breath on movement in the bed.  No focal weaknesses. Objective: Vitals:   08/17/19 2317 08/18/19 0313 08/18/19 0906 08/18/19 0908  BP: (!) 128/102 (!) 135/93 (!) 143/114   Pulse: 93 92 (!) 103 99  Resp: 16 15    Temp: 97.9 F (36.6 C)   97.7 F (36.5 C)  TempSrc: Oral   Oral  SpO2: 98% 100% 100% 100%  Weight:      Height:        Intake/Output Summary (Last 24 hours) at 08/18/2019 1002 Last data filed at 08/18/2019 0537 Gross per 24 hour  Intake 3066.21 ml  Output 200 ml  Net 2866.21 ml   Filed Weights   08/15/19 2345  Weight: 81.6 kg    Examination:  General exam: Appears calm but sick looking.  Jaundiced. Respiratory system: Clear to auscultation. Respiratory effort normal.  No added sound. Cardiovascular system: S1 & S2 heard, RRR. No JVD, murmurs, rubs, gallops or clicks. No pedal edema. Gastrointestinal system: Abdomen is nondistended, soft and nontender. No organomegaly or masses felt. Normal bowel sounds heard.  No ascites.  Suprapubic discomfort  present. Central nervous system: Alert and awake and oriented x4.  Does not have any tremors.  She was able to move all extremities. Extremities: Symmetric 5 x 5 power.  Generalized weakness.  She has no focal neurological deficits. Skin: No rashes, skin sloughing and abrasion right dorsum of the foot. Psychiatry: Judgement and insight appear compromised.  mood & affect flat. Bilateral lower extremity: Dusky discoloration of the tips of the toes mostly on the plantar surface, peripheral pulses are not palpable today. Cold in touch.     Data Reviewed: I have personally reviewed following labs and imaging studies  CBC: Recent Labs  Lab 08/15/19 2350 08/17/19 0439 08/18/19 0407  WBC 9.7 12.0* 12.2*  NEUTROABS 7.7  --   --   HGB 16.9* 15.8* 14.5  HCT 48.6* 45.8 42.1  MCV 100.8* 101.6* 101.7*  PLT 134* 106* 286*   Basic Metabolic Panel: Recent Labs  Lab 08/15/19 2350 08/17/19 0439 08/18/19 0407  NA 133* 130* 132*  K 4.1 4.4 4.3  CL 97* 100 100  CO2 21* 18* 19*  GLUCOSE 119* 135* 83  BUN 47* 41* 39*  CREATININE 1.64* 1.30* 1.48*  CALCIUM 8.7* 8.0* 7.9*  MG  --   --  1.9   GFR: Estimated Creatinine Clearance: 45.8 mL/min (A) (by C-G formula based on SCr of 1.48 mg/dL (H)). Liver Function  Tests: Recent Labs  Lab 08/15/19 2350 08/17/19 0439 08/18/19 0407  AST 59* 54* 69*  ALT 43 39 38  ALKPHOS 83 76 75  BILITOT 12.7* 12.1* 11.8*  PROT 7.7 6.5 5.9*  ALBUMIN 2.4* 1.9* 1.7*   Recent Labs  Lab 08/15/19 2350  LIPASE 49   Recent Labs  Lab 08/15/19 2350 08/17/19 0439 08/18/19 0407  AMMONIA 18 39* 42*   Coagulation Profile: Recent Labs  Lab 08/16/19 0016 08/17/19 0439 08/18/19 0407  INR 1.7* 1.9* 1.6*   Cardiac Enzymes: Recent Labs  Lab 08/16/19 1948  CKTOTAL 196   BNP (last 3 results) No results for input(s): PROBNP in the last 8760 hours. HbA1C: Recent Labs    08/17/19 0439  HGBA1C 5.8*   CBG: Recent Labs  Lab 08/16/19 1028  GLUCAP 105*    Lipid Profile: Recent Labs    08/17/19 0439  CHOL 93  HDL <10*  LDLCALC NOT CALCULATED  TRIG 177*  CHOLHDL NOT CALCULATED   Thyroid Function Tests: No results for input(s): TSH, T4TOTAL, FREET4, T3FREE, THYROIDAB in the last 72 hours. Anemia Panel: Recent Labs    08/16/19 1249  VITAMINB12 1,894*  FERRITIN 429*  TIBC 216*  IRON 84   Sepsis Labs: Recent Labs  Lab 08/15/19 2350 08/16/19 0400  LATICACIDVEN 2.8* 2.0*    Recent Results (from the past 240 hour(s))  SARS Coronavirus 2 [facility] order, Performed in [facility] hospital lab) Nasopharyngeal Nasopharyngeal Swab     Status: None   Collection Time: 08/16/19  3:03 AM   Specimen: Nasopharyngeal Swab  Result Value Ref Range Status   SARS Coronavirus 2 NEGATIVE NEGATIVE Final    Comment: (NOTE) If result is NEGATIVE SARS-CoV-2 target nucleic acids are NOT DETECTED. The SARS-CoV-2 RNA is generally detectable in upper and lower  respiratory specimens during the acute phase of infection. The lowest  concentration of SARS-CoV-2 viral copies this assay can detect is 250  copies / mL. A negative result does not preclude SARS-CoV-2 infection  and should not be used as the sole basis for treatment or other  patient management decisions.  A negative result may occur with  improper specimen collection / handling, submission of specimen other  than nasopharyngeal swab, presence of viral mutation(s) within the  areas targeted by this assay, and inadequate number of viral copies  (<250 copies / mL). A negative result must be combined with clinical  observations, patient history, and epidemiological information. If result is POSITIVE SARS-CoV-2 target nucleic acids are DETECTED. The SARS-CoV-2 RNA is generally detectable in upper and lower  respiratory specimens dur ing the acute phase of infection.  Positive  results are indicative of active infection with SARS-CoV-2.  Clinical  correlation with patient history and other  diagnostic information is  necessary to determine patient infection status.  Positive results do  not rule out bacterial infection or co-infection with other viruses. If result is PRESUMPTIVE POSTIVE SARS-CoV-2 nucleic acids MAY BE PRESENT.   A presumptive positive result was obtained on the submitted specimen  and confirmed on repeat testing.  While 2019 novel coronavirus  (SARS-CoV-2) nucleic acids may be present in the submitted sample  additional confirmatory testing may be necessary for epidemiological  and / or clinical management purposes  to differentiate between  SARS-CoV-2 and other Sarbecovirus currently known to infect humans.  If clinically indicated additional testing with an alternate test  methodology [phone]) is advised. The SARS-CoV-2 RNA is generally  detectable in upper and lower respiratory sp  ecimens during the acute  phase of infection. The expected result is Negative. Fact Sheet for Patients:  StrictlyIdeas.no Fact Sheet for Healthcare Providers: BankingDealers.co.za This test is not yet approved or cleared by the [place] FDA and has been authorized for detection and/or diagnosis of SARS-CoV-2 by FDA under an Emergency Use Authorization (EUA).  This EUA will remain in effect (meaning this test can be used) for the duration of the COVID-19 declaration under Section 564(b)(1) of the Act, 21 U.S.C. section 360bbb-3(b)(1), unless the authorization is terminated or revoked sooner. Performed at [facility], [place] [address], [place], [place] [address]   Culture, blood (routine x 2) Call MD if unable to obtain prior to antibiotics being given     Status: None (Preliminary result)   Collection Time: 08/16/19 10:16 AM   Specimen: BLOOD RIGHT WRIST  Result Value Ref Range Status   Specimen Description BLOOD RIGHT WRIST  Final   Special Requests   Final    BOTTLES DRAWN AEROBIC AND ANAEROBIC Blood Culture  adequate volume   Culture   Final    NO GROWTH 2 DAYS Performed at [facility], [place] [address], [place], [place] [address]    Report Status PENDING  Incomplete  Culture, blood (routine x 2) Call MD if unable to obtain prior to antibiotics being given     Status: None (Preliminary result)   Collection Time: 08/16/19 10:51 AM   Specimen: BLOOD  Result Value Ref Range Status   Specimen Description BLOOD RIGHT ANTECUBITAL  Final   Special Requests   Final    BOTTLES DRAWN AEROBIC AND ANAEROBIC Blood Culture results may not be optimal due to an excessive volume of blood received in culture bottles   Culture   Final    NO GROWTH 2 DAYS Performed at [facility], [place] [address] [place], [place] [address]    Report Status PENDING  Incomplete  Respiratory Panel by PCR     Status: None   Collection Time: 08/16/19 11:40 AM   Specimen: Nasopharyngeal Swab; Respiratory  Result Value Ref Range Status   Adenovirus NOT DETECTED NOT DETECTED Final   Coronavirus 229E NOT DETECTED NOT DETECTED Final    Comment: (NOTE) The Coronavirus on the Respiratory Panel, DOES NOT test for the novel  Coronavirus (2019 nCoV)    Coronavirus HKU1 NOT DETECTED NOT DETECTED Final   Coronavirus NL63 NOT DETECTED NOT DETECTED Final   Coronavirus OC43 NOT DETECTED NOT DETECTED Final   Metapneumovirus NOT DETECTED NOT DETECTED Final   Rhinovirus / Enterovirus NOT DETECTED NOT DETECTED Final   Influenza A NOT DETECTED NOT DETECTED Final   Influenza B NOT DETECTED NOT DETECTED Final   Parainfluenza Virus 1 NOT DETECTED NOT DETECTED Final   Parainfluenza Virus 2 NOT DETECTED NOT DETECTED Final   Parainfluenza Virus 3 NOT DETECTED NOT DETECTED Final   Parainfluenza Virus 4 NOT DETECTED NOT DETECTED Final   Respiratory Syncytial Virus NOT DETECTED NOT DETECTED Final   Bordetella pertussis NOT DETECTED NOT DETECTED Final   Chlamydophila pneumoniae NOT DETECTED NOT DETECTED Final   Mycoplasma pneumoniae NOT  DETECTED NOT DETECTED Final    Comment: Performed at [facility], [place]. [address], [place], [place] [address]         Radiology Studies: Mr Angio Head Wo Contrast  Result Date: 08/16/2019 CLINICAL DATA:  Stroke follow-up EXAM: MRA HEAD WITHOUT CONTRAST TECHNIQUE: Angiographic images of the Circle of Willis were obtained using  MRA technique without intravenous contrast. COMPARISON:  Brain MRI 08/16/2019 FINDINGS: The study is markedly motion degraded. POSTERIOR CIRCULATION: --Vertebral arteries: Normal V4 segments. --Posterior inferior cerebellar arteries (PICA): Patent origins from the vertebral arteries. --Anterior inferior cerebellar arteries (AICA): Patent origins from the basilar artery. --Basilar artery: Normal. --Superior cerebellar arteries: Normal. --Posterior cerebral arteries: Normal. The left PCA is predominantly supplied by the posterior communicating artery. ANTERIOR CIRCULATION: --Intracranial internal carotid arteries: Normal. --Anterior cerebral arteries (ACA): Normal. Both A1 segments are present. Patent anterior communicating artery (a-comm). --Middle cerebral arteries (MCA): Normal. IMPRESSION: 1. Markedly motion degraded examination. 2. No emergent large vessel occlusion or high-grade proximal stenosis. Electronically Signed   By: Ulyses Jarred M.D.   On: 08/16/2019 23:37   Mr Angio Neck Wo Contrast  Result Date: 08/16/2019 CLINICAL DATA:  Stroke follow-up EXAM: MRA NECK WITHOUT CONTRAST TECHNIQUE: Multiplanar and multiecho pulse sequences of the neck were obtained without intravenous contrast. Angiographic images of the neck were obtained using time-of-flight MRA technique. CONTRAST:  None COMPARISON:  None. FINDINGS: Study is degraded by motion and the lack of intravenous contrast material. The visualized portions of the common carotid arteries are normal. The V2 segments of both vertebral arteries are patent. There is limited visualization of the internal carotid arteries.  IMPRESSION: Severely motion limited study. The common carotid arteries and vertebral artery V2 segments are patent. Visualization of the internal carotid arteries is markedly limited. Consider repeat examination with contrast when the patient is better able to follow instructions. Electronically Signed   By: Ulyses Jarred M.D.   On: 08/16/2019 23:49   Mr Jeri Cos Contrast  Result Date: 08/16/2019 CLINICAL DATA:  53 year old female with altered mental status and generalized weakness, multiple subacute infarcts on brain MRI earlier today. EXAM: MRI HEAD WITH CONTRAST TECHNIQUE: Multiplanar, multiecho pulse sequences of the brain and surrounding structures were obtained with intravenous contrast. CONTRAST:  27m GADAVIST GADOBUTROL 1 MMOL/ML IV SOLN COMPARISON:  Noncontrast brain MRI earlier today. FINDINGS: Mild gyriform post ischemic appearing enhancement in the anterior right superior frontal gyrus, corresponding to the most confluent area of diffusion signal abnormality earlier today (series 7, image 22). Faint linear post ischemic appearing enhancement also in the white matter of the left middle frontal gyrus, corresponding to subtle diffusion abnormality today). No other abnormal intracranial enhancement.  No dural thickening. The major dural venous sinuses are enhancing and appear to be patent. No intracranial mass effect or ventriculomegaly. IMPRESSION: 1. Gyriform post ischemic appearing enhancement in the right ACA territory, corresponding to most confluent diffusion abnormality earlier today. 2. Faint linear enhancement in the left frontal lobe white matter appears to reflect an area of late subacute ischemia. Electronically Signed   By: HGenevie AnnM.D.   On: 08/16/2019 16:58   [place] Doppler  Result Date: 08/18/2019 CLINICAL DATA:  Jaundice. EXAM: DUPLEX ULTRASOUND OF LIVER TECHNIQUE: Color and duplex Doppler ultrasound was performed to evaluate the hepatic in-flow and out-flow vessels. COMPARISON:  CT  scan of August 16, 2019. FINDINGS: Liver: Normal parenchymal echogenicity. Normal hepatic contour without nodularity. No focal lesion, mass or intrahepatic biliary ductal dilatation. Main Portal Vein size: 1.0 cm Portal Vein Velocities Main Prox:  17 cm/sec Main Mid: 13 cm/sec Main Dist:  12 cm/sec Right: 9 cm/sec Left: 10 cm/sec Normal hepatopetal flow is noted in the portal veins. Hepatic Vein Velocities Right:  28 cm/sec Middle:  29 cm/sec Left:  32 cm/sec Normal hepatofugal flow is noted in the hepatic veins. IVC: Present and  patent with normal respiratory phasicity. Hepatic Artery Velocity:  44 cm/sec Splenic Vein Velocity:  22 cm/sec Spleen: 9.1 cm x 0.6 cm x 8.3 cm with a total volume of 140 cm^3 (411 cm^3 is upper limit normal) Portal Vein Occlusion/Thrombus: No Splenic Vein Occlusion/Thrombus: No Ascites: None Varices: None IMPRESSION: No evidence of hepatic, portal or splenic venous thrombosis or occlusion is noted. Electronically Signed   By: Marijo Conception M.D.   On: 08/18/2019 07:28   Vas US Carotid  Result Date: 08/17/2019 Carotid Arterial Duplex Study Indications:       CVA. Risk Factors:      Current smoker. Limitations        Today's exam was limited due to the patient's inability or                    unwillingness to cooperate. Comparison Study:  no prior Performing Technologist: June Leap RDMS, RVT  Examination Guidelines: A complete evaluation includes B-mode imaging, spectral Doppler, color Doppler, and power Doppler as needed of all accessible portions of each vessel. Bilateral testing is considered an integral part of a complete examination. Limited examinations for reoccurring indications may be performed as noted.  Right Carotid Findings: +----------+--------+--------+--------+------------------+--------+             PSV cm/s EDV cm/s Stenosis Plaque Description Comments  +----------+--------+--------+--------+------------------+--------+  CCA Prox   44       12                                              +----------+--------+--------+--------+------------------+--------+  CCA Distal 65       16                                             +----------+--------+--------+--------+------------------+--------+  ICA Prox   33       11       1-39%    heterogenous                 +----------+--------+--------+--------+------------------+--------+  ICA Distal 41       15                                             +----------+--------+--------+--------+------------------+--------+  ECA        55       8                                              +----------+--------+--------+--------+------------------+--------+ +----------+--------+-------+----------------+-------------------+             PSV cm/s EDV cms Describe         Arm Pressure (mmHG)  +----------+--------+-------+----------------+-------------------+  Subclavian 89               Multiphasic, WNL                      +----------+--------+-------+----------------+-------------------+ +---------+--------+--+--------+-+---------+  Vertebral PSV cm/s 33 EDV cm/s 7 Antegrade  +---------+--------+--+--------+-+---------+  Left Carotid Findings: +----------+--------+--------+--------+------------------+--------+  PSV cm/s EDV cm/s Stenosis Plaque Description Comments  +----------+--------+--------+--------+------------------+--------+  CCA Prox   77       20                                             +----------+--------+--------+--------+------------------+--------+  CCA Distal 46       15                                             +----------+--------+--------+--------+------------------+--------+  ICA Prox   47       20       1-39%    heterogenous                 +----------+--------+--------+--------+------------------+--------+  ICA Distal 61       25                                             +----------+--------+--------+--------+------------------+--------+  ECA        60       11                                              +----------+--------+--------+--------+------------------+--------+ +----------+--------+--------+----------------+-------------------+             PSV cm/s EDV cm/s Describe         Arm Pressure (mmHG)  +----------+--------+--------+----------------+-------------------+  Subclavian 73                Multiphasic, WNL                      +----------+--------+--------+----------------+-------------------+ +---------+--------+--+--------+-+---------+  Vertebral PSV cm/s 29 EDV cm/s 8 Antegrade  +---------+--------+--+--------+-+---------+  Summary: Right Carotid: Velocities in the right ICA are consistent with a 1-39% stenosis. Left Carotid: Velocities in the left ICA are consistent with a 1-39% stenosis.  *See table(s) above for measurements and observations.     Preliminary    Vas [place] Lower Extremity Arterial Duplex  Result Date: 08/17/2019 LOWER EXTREMITY ARTERIAL DUPLEX STUDY Other Factors: Leg weakness and discoloration, Afib.  Current ABI: No ABI due to absent pedal flow to obtain pressure Limitations: Poor image quality due to sunlight in patient's room Comparison Study: no prior Performing Technologist: June Leap RDMS, RVT  Examination Guidelines: A complete evaluation includes B-mode imaging, spectral Doppler, color Doppler, and power Doppler as needed of all accessible portions of each vessel. Bilateral testing is considered an integral part of a complete examination. Limited examinations for reoccurring indications may be performed as noted.  +-----------+--------+-----+--------+----------+------------------+  RIGHT       PSV cm/s Ratio Stenosis Waveform   Comments            +-----------+--------+-----+--------+----------+------------------+  CFA Prox    57                      triphasic                      +-----------+--------+-----+--------+----------+------------------+  DFA  43                      triphasic                       +-----------+--------+-----+--------+----------+------------------+  SFA Prox    61                      triphasic                      +-----------+--------+-----+--------+----------+------------------+  SFA Mid     54                      triphasic                      +-----------+--------+-----+--------+----------+------------------+  SFA Distal  34                      triphasic                      +-----------+--------+-----+--------+----------+------------------+  POP Prox    25                      monophasic thrombus vs plaque  +-----------+--------+-----+--------+----------+------------------+  POP Mid                    occluded                                +-----------+--------+-----+--------+----------+------------------+  POP Distal                 occluded                                +-----------+--------+-----+--------+----------+------------------+  ATA Distal  7                       monophasic severely dampened   +-----------+--------+-----+--------+----------+------------------+  PTA Distal                 occluded                                +-----------+--------+-----+--------+----------+------------------+  PERO Distal 12                      monophasic severely dampened   +-----------+--------+-----+--------+----------+------------------+  +-----------+--------+-----+--------+----------+------------------+  LEFT        PSV cm/s Ratio Stenosis Waveform   Comments            +-----------+--------+-----+--------+----------+------------------+  CFA Prox    64                      triphasic                      +-----------+--------+-----+--------+----------+------------------+  DFA         67                      triphasic                      +-----------+--------+-----+--------+----------+------------------+  SFA Prox    65  triphasic                      +-----------+--------+-----+--------+----------+------------------+  SFA Mid     74                       triphasic                      +-----------+--------+-----+--------+----------+------------------+  SFA Distal  40                      triphasic                      +-----------+--------+-----+--------+----------+------------------+  POP Prox    21                      biphasic                       +-----------+--------+-----+--------+----------+------------------+  POP Mid                    occluded            partially occluded  +-----------+--------+-----+--------+----------+------------------+  POP Distal                 occluded            thrombus vs plaque  +-----------+--------+-----+--------+----------+------------------+  ATA Distal  6                       monophasic severely dampened   +-----------+--------+-----+--------+----------+------------------+  PTA Distal  11                      monophasic severely dampened   +-----------+--------+-----+--------+----------+------------------+  PERO Distal 8                       monophasic severely dampened   +-----------+--------+-----+--------+----------+------------------+  Arterial Summary: 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 Incidental finding Right popliteal vein DVT. 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  See table(s) above for measurements and observations.    Preliminary         Scheduled Meds:  digoxin  0.125 mg Oral Daily   folic acid  1 mg Oral Daily   furosemide  40 mg Intravenous Daily   lactulose  10 g Oral BID   levofloxacin  750 mg Oral Q48H   multivitamin with minerals  1 tablet Oral Daily   nicotine  21 mg Transdermal Daily   rifaximin  550 mg Oral BID   spironolactone  12.5 mg Oral Daily   Continuous Infusions:  dextrose 5 % and 0.9% NaCl 100 mL/hr at 08/17/19 2147   heparin 1,400 Units/hr (08/18/19 0607)   thiamine injection 500 mg (08/17/19 0823)     LOS: 2 days    Time spent: 35 minutes    Barb Merino, MD [facility] Pager [phone]  If  7PM-7AM, please contact night-coverage [email or web address] Password [email or web address] 08/18/2019, 10:02 AM

## 2019-08-18 NOTE — Progress Notes (Signed)
PT Cancellation Note  Patient Details Name: Briana Andrews MRN: [id number] DOB: [birth year]   Cancelled Treatment:    Reason Eval/Treat Not Completed: Medical issues which prohibited therapy patient with LV thrombus and acute R popliteal vein DVT, on heparin but most recent level subtherapeutic at 0.16. Will follow and attempt therapy when patient medically appropriate if time/schedule allow today, otherwise will plan to attempt to see on next day of service.    Deniece Ree PT, DPT, CBIS  Supplemental Physical Therapist [facility]    Pager [phone] Acute Rehab Office [phone]

## 2019-08-18 NOTE — Progress Notes (Signed)
OT Cancellation Note  Patient Details Name: Briana Andrews MRN: [id number] DOB: Feb 09, 1966   Cancelled Treatment:    Reason Eval/Treat Not Completed: Patient not medically ready LV thrombus and acute R popliteal vein DVT, on heparin but most recent level subtherapeutic at 0.16  Richelle Ito, OTR/L  Acute Rehabilitation Services Pager: [phone] Office: [phone] .  08/18/2019, 8:58 AM

## 2019-08-18 NOTE — Progress Notes (Signed)
Rehab Admissions Coordinator Note:  Per therapy recommendations, patient was screened by Michel Santee for appropriateness for an Inpatient Acute Rehab Consult.  At this time, we are recommending Inpatient Rehab consult.  I will page MD for consult order.   Michel Santee 08/18/2019, 9:36 AM  I can be reached at [phone].

## 2019-08-18 NOTE — Progress Notes (Signed)
[facility] Gastroenterology Progress Note  Briana Andrews 53 y.o. 19-Dec-1965  CC: Possible alcoholic hepatitis with encephalopathy   Subjective: Feeling better from GI standpoint.  Remains alert and oriented x3.  Complaining of increased flatus.  Complaining of pain with urination.  Denies any blood in the stool.  ROS : Negative for chest pain and shortness of breath.   Objective: Vital signs in last 24 hours: Vitals:   08/18/19 0906 08/18/19 0908  BP: (!) 143/114   Pulse: (!) 103 99  Resp:    Temp:  97.7 F (36.5 C)  SpO2: 100% 100%    Physical Exam:  General:  Alert, cooperative, no distress, appears stated age  Head:  Normocephalic, without obvious abnormality, atraumatic  Eyes:   Scleral icterus noted  Lungs:    Bibasilar rales.  Anterior exam only  Heart:  Regular rate and rhythm, S1, S2 normal  Abdomen:    Abdomen mildly distended, nontender, bowel sounds present.  No peritoneal sign  Extremities:  Right lower extremity dressing noted.       Lab Results: Recent Labs    08/17/19 0439 08/18/19 0407  NA 130* 132*  K 4.4 4.3  CL 100 100  CO2 18* 19*  GLUCOSE 135* 83  BUN 41* 39*  CREATININE 1.30* 1.48*  CALCIUM 8.0* 7.9*  MG  --  1.9   Recent Labs    08/17/19 0439 08/18/19 0407  AST 54* 69*  ALT 39 38  ALKPHOS 76 75  BILITOT 12.1* 11.8*  PROT 6.5 5.9*  ALBUMIN 1.9* 1.7*   Recent Labs    08/15/19 2350 08/17/19 0439 08/18/19 0407  WBC 9.7 12.0* 12.2*  NEUTROABS 7.7  --   --   HGB 16.9* 15.8* 14.5  HCT 48.6* 45.8 42.1  MCV 100.8* 101.6* 101.7*  PLT 134* 106* 110*   Recent Labs    08/17/19 0439 08/18/19 0407  LABPROT 21.6* 19.2*  INR 1.9* 1.6*      Assessment/Plan: -Jaundice.  Significantly elevated T bili at 12.7 with mild elevated AST at 59.  Normal ALT and alkaline phosphatase.  CT scan showed normal-appearing liver and pancreas.  No biliary dilatation.  No gallstones or gall bladder wall thickening.  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 -Altered mental status.  MRI concerning for recent stroke. 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   Recommendations ------------------------- - Discriminant function score 30.2 today/LFTs slowly improving.  -  Recheck CMP and INR in the morning. - Hepatitis panel negative.  Iron saturation of only 39%.  Elevated ferritin at 429 could be from acute phase reaction. -Check urine culture. -Continue  rifaximin.  Lactulose as needed to have 2-3 small bowel movements per day. -GI will follow  Otis Brace MD, [place] 08/18/2019, 10:38 AM  Contact #  [phone]

## 2019-08-18 NOTE — Progress Notes (Signed)
  Speech Language Pathology Treatment: Cognitive-Linquistic  Patient Details Name: Briana Andrews MRN: [id number] DOB: [birth year] Today's Date: 08/18/2019 Time: 4098-1191 SLP Time Calculation (min) (ACUTE ONLY): 24 min  Assessment / Plan / Recommendation Clinical Impression  Pt was seen for cognitive-lingusitic treatment and was cooperative during the session. However, moderate cues were needed for attention and she stated that she was having notably difficulty focusing due to distraction from pain. She demonstrated 25% accuracy with 5-item immediate recall increasing to 100% with mod cues. She achieved 50% accuracy with recall of 4 items. She completed time management problems with 25% accuracy increasing to 75% with mod cues. A mental manipulation sequencing task was attempted but pt was unable to complete this task despite cues. She completed abstract reasoning with 80% accuracy. SLP will continue to follow pt.     HPI HPI: 53 year old female with history of smoking, alcohol use, she drinks more than 6 packs of beer every day since last 20 years, no chronic medical issues brought to the emergency room with confusion more than usual and falls.  Since last few months there have been noticing some changes on her including weakness on her legs and poor appetite as much as she stopped smoking and decreased her drinking.  2 weeks ago, she was taken to [facility] with a fall, skeletal survey was negative except L1 fracture with 25% height loss and discharged home on muscle relaxants.  Patient has been intermittently confused since then. In the emergency room, she was found with subacute right ACA stroke, jaundice with bilirubin of 12, bilateral multifocal pneumonia and metabolic encephalopathy.      SLP Plan  Continue with current plan of care       Recommendations                   Follow up Recommendations: Inpatient Rehab SLP Visit Diagnosis: Attention and concentration  deficit Attention and concentration deficit following: Cerebral infarction Plan: Continue with current plan of care       Demarian Epps I. Hardin Negus, [place], [place] Office number [phone] Pager [place] 08/18/2019, 11:18 AM

## 2019-08-18 NOTE — Progress Notes (Signed)
ANTICOAGULATION CONSULT NOTE  Pharmacy Consult:  Heparin Indication:  LV thrombus and CVA  Allergies  Allergen Reactions  . Penicillins Other (See Comments)    Unsure of reaction Did it involve swelling of the face/tongue/throat, SOB, or low BP? Unknown Did it involve sudden or severe rash/hives, skin peeling, or any reaction on the inside of your mouth or nose? Unknown Did you need to seek medical attention at a hospital or doctor's office? Unknown When did it last happen?[place] If all above answers are "NO", may proceed with cephalosporin use.    Patient Measurements: Height: 5\' 5"  (165.1 cm) Weight: 180 lb (81.6 kg) IBW/kg (Calculated) : 57 Heparin Dosing Weight: 75 kg  Vital Signs: Temp: 97.7 F (36.5 C) (09/21 1253) Temp Source: Oral (09/21 1253) BP: 131/91 (09/21 1253) Pulse Rate: 91 (09/21 1253)  Labs: Recent Labs    08/15/19 2350 08/16/19 0016 08/16/19 1030 08/16/19 1948 08/17/19 0439 08/17/19 1829 08/18/19 0407 08/18/19 1255  HGB 16.9*  --   --   --  15.8*  --  14.5  --   HCT 48.6*  --   --   --  45.8  --  42.1  --   PLT 134*  --   --   --  106*  --  110*  --   APTT  --   --  31  --  33  --   --   --   LABPROT  --  20.1*  --   --  21.6*  --  19.2*  --   INR  --  1.7*  --   --  1.9*  --  1.6*  --   HEPARINUNFRC  --   --   --   --   --  <0.10* 0.16* 0.21*  CREATININE 1.64*  --   --   --  1.30*  --  1.48*  --   CKTOTAL  --   --   --  196  --   --   --   --     Estimated Creatinine Clearance: 45.8 mL/min (A) (by C-G formula based on SCr of 1.48 mg/dL (H)).  Assessment: 89 YOF with acute stroke and LV thrombus to continue on IV heparin.    Heparin level 0.21    Goal of Therapy:  Heparin level 0.3-0.5 units/ml Monitor platelets by anticoagulation protocol: Yes   Plan:  Increase heparin gtt to 1700 units/hr  F/u 6 hour heparin level  Thank you Anette Guarneri, PharmD Please check AMION for all [place] numbers 08/18/2019 1:41 PM

## 2019-08-18 NOTE — Progress Notes (Signed)
Progress Note  Patient Name: Briana Andrews Date of Encounter: 08/18/2019  Primary Cardiologist: No primary care provider on file. new Dr. Meda Coffee  Subjective   No new issues overnight - appetite is poor, very dyspneic, concerning for advanced CHF/biventricular failure with possible pneumonia. SHe was recorded 2.8L positive overnight- 6.8+ overall, output not recorded. BP elevated. Weight not recorded.  Creatinine is rising - urinary retention noted, plan foley today.   Inpatient Medications    Scheduled Meds:  digoxin  0.125 mg Oral Daily   folic acid  1 mg Oral Daily   furosemide  40 mg Intravenous Daily   lactulose  10 g Oral BID   levofloxacin  750 mg Oral Q48H   multivitamin with minerals  1 tablet Oral Daily   nicotine  21 mg Transdermal Daily   rifaximin  550 mg Oral BID   spironolactone  12.5 mg Oral Daily   Continuous Infusions:  dextrose 5 % and 0.9% NaCl 100 mL/hr at 08/17/19 2147   heparin 1,400 Units/hr (08/18/19 0607)   thiamine injection 500 mg (08/17/19 0823)   PRN Meds: albuterol, dextromethorphan-guaiFENesin, methocarbamol, ondansetron **OR** ondansetron (ZOFRAN) IV, oxyCODONE   Vital Signs    Vitals:   08/17/19 2317 08/18/19 0313 08/18/19 0906 08/18/19 0908  BP: (!) 128/102 (!) 135/93 (!) 143/114   Pulse: 93 92 (!) 103 99  Resp: 16 15    Temp: 97.9 F (36.6 C)   97.7 F (36.5 C)  TempSrc: Oral   Oral  SpO2: 98% 100% 100% 100%  Weight:      Height:        Intake/Output Summary (Last 24 hours) at 08/18/2019 0956 Last data filed at 08/18/2019 3875 Gross per 24 hour  Intake 3066.21 ml  Output 200 ml  Net 2866.21 ml   Last 3 Weights 08/15/2019 08/04/2019  Weight (lbs) 180 lb 180 lb  Weight (kg) 81.647 kg 81.647 kg      Telemetry      - Personally Reviewed  ECG    N/A  Physical Exam   General appearance: alert, icteric and ill-appearing Neck: JVD - several cm above sternal notch, no carotid bruit and thyroid not enlarged,  symmetric, no tenderness/mass/nodules Lungs: diminished breath sounds bibasilar Heart: regular rate and rhythm Abdomen: protuberant, non-tender Extremities: edema 1+, both feet are cool and dusky, do not appear well-perfused and chronic stasis changes Pulses: weak distal pulses Skin: jaundiced Neurologic: Mental status: awake, oriented to place, month, year Psych: Pleasant  Labs    High Sensitivity Troponin:  No results for input(s): TROPONINIHS in the last 720 hours.    Chemistry Recent Labs  Lab 08/15/19 2350 08/17/19 0439 08/18/19 0407  NA 133* 130* 132*  K 4.1 4.4 4.3  CL 97* 100 100  CO2 21* 18* 19*  GLUCOSE 119* 135* 83  BUN 47* 41* 39*  CREATININE 1.64* 1.30* 1.48*  CALCIUM 8.7* 8.0* 7.9*  PROT 7.7 6.5 5.9*  ALBUMIN 2.4* 1.9* 1.7*  AST 59* 54* 69*  ALT 43 39 38  ALKPHOS 83 76 75  BILITOT 12.7* 12.1* 11.8*  GFRNONAA 35* 46* 40*  GFRAA 41* 54* 46*  ANIONGAP 15 12 13      Hematology Recent Labs  Lab 08/15/19 2350 08/17/19 0439 08/18/19 0407  WBC 9.7 12.0* 12.2*  RBC 4.82 4.51 4.14  HGB 16.9* 15.8* 14.5  HCT 48.6* 45.8 42.1  MCV 100.8* 101.6* 101.7*  MCH 35.1* 35.0* 35.0*  MCHC 34.8 34.5 34.4  RDW 20.8* 19.9* 19.9*  PLT 134* 106* 110*    BNPNo results for input(s): BNP, PROBNP in the last 168 hours.   DDimer No results for input(s): DDIMER in the last 168 hours.   Radiology    Mr Angio Head Wo Contrast  Result Date: 08/16/2019 CLINICAL DATA:  Stroke follow-up EXAM: MRA HEAD WITHOUT CONTRAST TECHNIQUE: Angiographic images of the Circle of Willis were obtained using MRA technique without intravenous contrast. COMPARISON:  Brain MRI 08/16/2019 FINDINGS: The study is markedly motion degraded. POSTERIOR CIRCULATION: --Vertebral arteries: Normal V4 segments. --Posterior inferior cerebellar arteries (PICA): Patent origins from the vertebral arteries. --Anterior inferior cerebellar arteries (AICA): Patent origins from the basilar artery. --Basilar artery:  Normal. --Superior cerebellar arteries: Normal. --Posterior cerebral arteries: Normal. The left PCA is predominantly supplied by the posterior communicating artery. ANTERIOR CIRCULATION: --Intracranial internal carotid arteries: Normal. --Anterior cerebral arteries (ACA): Normal. Both A1 segments are present. Patent anterior communicating artery (a-comm). --Middle cerebral arteries (MCA): Normal. IMPRESSION: 1. Markedly motion degraded examination. 2. No emergent large vessel occlusion or high-grade proximal stenosis. Electronically Signed   By: Deatra RobinsonKevin  Herman M.D.   On: 08/16/2019 23:37   Mr Angio Neck Wo Contrast  Result Date: 08/16/2019 CLINICAL DATA:  Stroke follow-up EXAM: MRA NECK WITHOUT CONTRAST TECHNIQUE: Multiplanar and multiecho pulse sequences of the neck were obtained without intravenous contrast. Angiographic images of the neck were obtained using time-of-flight MRA technique. CONTRAST:  None COMPARISON:  None. FINDINGS: Study is degraded by motion and the lack of intravenous contrast material. The visualized portions of the common carotid arteries are normal. The V2 segments of both vertebral arteries are patent. There is limited visualization of the internal carotid arteries. IMPRESSION: Severely motion limited study. The common carotid arteries and vertebral artery V2 segments are patent. Visualization of the internal carotid arteries is markedly limited. Consider repeat examination with contrast when the patient is better able to follow instructions. Electronically Signed   By: Deatra RobinsonKevin  Herman M.D.   On: 08/16/2019 23:49   Mr Laqueta JeanBrain W Contrast  Result Date: 08/16/2019 CLINICAL DATA:  53 year old female with altered mental status and generalized weakness, multiple subacute infarcts on brain MRI earlier today. EXAM: MRI HEAD WITH CONTRAST TECHNIQUE: Multiplanar, multiecho pulse sequences of the brain and surrounding structures were obtained with intravenous contrast. CONTRAST:  8mL GADAVIST  GADOBUTROL 1 MMOL/ML IV SOLN COMPARISON:  Noncontrast brain MRI earlier today. FINDINGS: Mild gyriform post ischemic appearing enhancement in the anterior right superior frontal gyrus, corresponding to the most confluent area of diffusion signal abnormality earlier today (series 7, image 22). Faint linear post ischemic appearing enhancement also in the white matter of the left middle frontal gyrus, corresponding to subtle diffusion abnormality today). No other abnormal intracranial enhancement.  No dural thickening. The major dural venous sinuses are enhancing and appear to be patent. No intracranial mass effect or ventriculomegaly. IMPRESSION: 1. Gyriform post ischemic appearing enhancement in the right ACA territory, corresponding to most confluent diffusion abnormality earlier today. 2. Faint linear enhancement in the left frontal lobe white matter appears to reflect an area of late subacute ischemia. Electronically Signed   By: Odessa FlemingH  Hall M.D.   On: 08/16/2019 16:58   [place] Liver Doppler  Result Date: 08/18/2019 CLINICAL DATA:  Jaundice. EXAM: DUPLEX ULTRASOUND OF LIVER TECHNIQUE: Color and duplex Doppler ultrasound was performed to evaluate the hepatic in-flow and out-flow vessels. COMPARISON:  CT scan of August 16, 2019. FINDINGS: Liver: Normal parenchymal echogenicity. Normal hepatic contour without nodularity. No focal lesion, mass or intrahepatic biliary  ductal dilatation. Main Portal Vein size: 1.0 cm Portal Vein Velocities Main Prox:  17 cm/sec Main Mid: 13 cm/sec Main Dist:  12 cm/sec Right: 9 cm/sec Left: 10 cm/sec Normal hepatopetal flow is noted in the portal veins. Hepatic Vein Velocities Right:  28 cm/sec Middle:  29 cm/sec Left:  32 cm/sec Normal hepatofugal flow is noted in the hepatic veins. IVC: Present and patent with normal respiratory phasicity. Hepatic Artery Velocity:  44 cm/sec Splenic Vein Velocity:  22 cm/sec Spleen: 9.1 cm x 0.6 cm x 8.3 cm with a total volume of 140 cm^3 (411 cm^3 is  upper limit normal) Portal Vein Occlusion/Thrombus: No Splenic Vein Occlusion/Thrombus: No Ascites: None Varices: None IMPRESSION: No evidence of hepatic, portal or splenic venous thrombosis or occlusion is noted. Electronically Signed   By: Lupita RaiderJames  Green Jr M.D.   On: 08/18/2019 07:28   Vas [place] Carotid  Result Date: 08/17/2019 Carotid Arterial Duplex Study Indications:       CVA. Risk Factors:      Current smoker. Limitations        Today's exam was limited due to the patient's inability or                    unwillingness to cooperate. Comparison Study:  no prior Performing Technologist: Jeb LeveringJill Parker RDMS, RVT  Examination Guidelines: A complete evaluation includes B-mode imaging, spectral Doppler, color Doppler, and power Doppler as needed of all accessible portions of each vessel. Bilateral testing is considered an integral part of a complete examination. Limited examinations for reoccurring indications may be performed as noted.  Right Carotid Findings: +----------+--------+--------+--------+------------------+--------+             PSV cm/s EDV cm/s Stenosis Plaque Description Comments  +----------+--------+--------+--------+------------------+--------+  CCA Prox   44       12                                             +----------+--------+--------+--------+------------------+--------+  CCA Distal 65       16                                             +----------+--------+--------+--------+------------------+--------+  ICA Prox   33       11       1-39%    heterogenous                 +----------+--------+--------+--------+------------------+--------+  ICA Distal 41       15                                             +----------+--------+--------+--------+------------------+--------+  ECA        55       8                                              +----------+--------+--------+--------+------------------+--------+ +----------+--------+-------+----------------+-------------------+             PSV cm/s EDV  cms Describe  Arm Pressure (mmHG)  +----------+--------+-------+----------------+-------------------+  Subclavian 89               Multiphasic, WNL                      +----------+--------+-------+----------------+-------------------+ +---------+--------+--+--------+-+---------+  Vertebral PSV cm/s 33 EDV cm/s 7 Antegrade  +---------+--------+--+--------+-+---------+  Left Carotid Findings: +----------+--------+--------+--------+------------------+--------+             PSV cm/s EDV cm/s Stenosis Plaque Description Comments  +----------+--------+--------+--------+------------------+--------+  CCA Prox   77       20                                             +----------+--------+--------+--------+------------------+--------+  CCA Distal 46       15                                             +----------+--------+--------+--------+------------------+--------+  ICA Prox   47       20       1-39%    heterogenous                 +----------+--------+--------+--------+------------------+--------+  ICA Distal 61       25                                             +----------+--------+--------+--------+------------------+--------+  ECA        60       11                                             +----------+--------+--------+--------+------------------+--------+ +----------+--------+--------+----------------+-------------------+             PSV cm/s EDV cm/s Describe         Arm Pressure (mmHG)  +----------+--------+--------+----------------+-------------------+  Subclavian 73                Multiphasic, WNL                      +----------+--------+--------+----------------+-------------------+ +---------+--------+--+--------+-+---------+  Vertebral PSV cm/s 29 EDV cm/s 8 Antegrade  +---------+--------+--+--------+-+---------+  Summary: Right Carotid: Velocities in the right ICA are consistent with a 1-39% stenosis. Left Carotid: Velocities in the left ICA are consistent with a 1-39% stenosis.  *See table(s)  above for measurements and observations.     Preliminary    Vas [place] Lower Extremity Arterial Duplex  Result Date: 08/17/2019 LOWER EXTREMITY ARTERIAL DUPLEX STUDY Other Factors: Leg weakness and discoloration, Afib.  Current ABI: No ABI due to absent pedal flow to obtain pressure Limitations: Poor image quality due to sunlight in patient's room Comparison Study: no prior Performing Technologist: Jeb LeveringJill Parker RDMS, RVT  Examination Guidelines: A complete evaluation includes B-mode imaging, spectral Doppler, color Doppler, and power Doppler as needed of all accessible portions of each vessel. Bilateral testing is considered an integral part of a complete examination. Limited examinations for reoccurring indications may be performed as noted.  +-----------+--------+-----+--------+----------+------------------+  RIGHT       PSV  cm/s Ratio Stenosis Waveform   Comments            +-----------+--------+-----+--------+----------+------------------+  CFA Prox    57                      triphasic                      +-----------+--------+-----+--------+----------+------------------+  DFA         43                      triphasic                      +-----------+--------+-----+--------+----------+------------------+  SFA Prox    61                      triphasic                      +-----------+--------+-----+--------+----------+------------------+  SFA Mid     54                      triphasic                      +-----------+--------+-----+--------+----------+------------------+  SFA Distal  34                      triphasic                      +-----------+--------+-----+--------+----------+------------------+  POP Prox    25                      monophasic thrombus vs plaque  +-----------+--------+-----+--------+----------+------------------+  POP Mid                    occluded                                +-----------+--------+-----+--------+----------+------------------+  POP Distal                 occluded                                 +-----------+--------+-----+--------+----------+------------------+  ATA Distal  7                       monophasic severely dampened   +-----------+--------+-----+--------+----------+------------------+  PTA Distal                 occluded                                +-----------+--------+-----+--------+----------+------------------+  PERO Distal 12                      monophasic severely dampened   +-----------+--------+-----+--------+----------+------------------+  +-----------+--------+-----+--------+----------+------------------+  LEFT        PSV cm/s Ratio Stenosis Waveform   Comments            +-----------+--------+-----+--------+----------+------------------+  CFA Prox    64                      triphasic                      +-----------+--------+-----+--------+----------+------------------+  DFA         67                      triphasic                      +-----------+--------+-----+--------+----------+------------------+  SFA Prox    65                      triphasic                      +-----------+--------+-----+--------+----------+------------------+  SFA Mid     74                      triphasic                      +-----------+--------+-----+--------+----------+------------------+  SFA Distal  40                      triphasic                      +-----------+--------+-----+--------+----------+------------------+  POP Prox    21                      biphasic                       +-----------+--------+-----+--------+----------+------------------+  POP Mid                    occluded            partially occluded  +-----------+--------+-----+--------+----------+------------------+  POP Distal                 occluded            thrombus vs plaque  +-----------+--------+-----+--------+----------+------------------+  ATA Distal  6                       monophasic severely dampened   +-----------+--------+-----+--------+----------+------------------+  PTA Distal  11                       monophasic severely dampened   +-----------+--------+-----+--------+----------+------------------+  PERO Distal 8                       monophasic severely dampened   +-----------+--------+-----+--------+----------+------------------+  Arterial Summary: 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 Incidental finding Right popliteal vein DVT. 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  See table(s) above for measurements and observations.    Preliminary     Cardiac Studies   Procedure: 2D Echo, 3D Echo, Cardiac Doppler, Color Doppler and Strain Analysis  Indications:    Stroke   History:        Patient has no prior history of Echocardiogram examinations.                 Alcohol Abuse.   Sonographer:    Irving Burton Senior RDCS Referring Phys: [id number] Dorcas Carrow  IMPRESSIONS    1. Left ventricular ejection fraction, by visual estimation, is approximately 15-20%. Mildly increased left ventricular size. There is borderline left ventricular hypertrophy.  2. Large, fixed thrombus on the apical septal wall of the left ventricle measuring 3.1 x 1.4 cm.  3. The average left ventricular global longitudinal strain is -4.0 %.  4. Left ventricular diastolic Doppler parameters are consistent with pseudonormalization pattern of LV diastolic filling.  5. Left atrial size was moderately dilated.  6. Mild to moderate mitral annular calcification.  7. The mitral valve is degenerative. Mild to moderate mitral valve regurgitation.  8. The tricuspid valve is grossly normal. Tricuspid valve regurgitation is mild.  9. The pulmonic valve was grossly normal. Pulmonic valve regurgitation is trivial by color flow Doppler. 10. Mildly elevated pulmonary artery systolic pressure. 11. The aortic valve has an indeterminant number of cusps, possibly functionally bicuspid. Aortic valve regurgitation is trivial by color flow Doppler. Mild aortic valve sclerosis without stenosis. 12.  Right atrial size was normal. 13. Multiple segmental abnormalities exist. See findings. 14. The inferior vena cava is dilated in size with >50% respiratory variability, suggesting right atrial pressure of 8 mmHg. 15. Global right ventricle has severely reduced systolic function.The right ventricular size is normal. No increase in right ventricular wall thickness.  Patient Profile     53 y.o. female with past medical history of alcohol use and tobacco use with no prior cardiac history and admitted 08/15/19 with SOB, jaundiced dx with PNA, CVA, and echo with EF 15-20%, large fixed thrombus on apical septal wall of LV measuring 3.1 X 1.4 cm and moderate MR and mild TR   Assessment & Plan    End stage biventricular CHF, unknown etiology possible sec to etoh abuse --thought to be non ischemic and related to ETOH --on Lasix 40 mg IV BID I&O +6826 no new wts.  --spironolactone 12.5 and dig 0.125 daily.  Avoid BB -- Cr 1.48  -- plan for foley catheter today- monitor for urine output, may have to consider lasix gtts. -- If creatinine continues to rise, may consider short-term inotrope therapy - doubt she is a candidate for advanced HF therapies, but may consider advanced HF service evaluation -- I would not recommend LifeVest as this is likely non-ischemic and functional status is poor at this point.  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 Ultimately, would need to transition to oral anticoagulation - there is very little data on the use of DOAC for LV thrombus - I would personally favor warfarin, which is considered standard of care and this may also help [place] to determine if she will be compliant with therapy (INR checks, etc.)   PAD - noted to have poor distal pulses and cool, dusky extremities. LE arterial dopplers show occluded bilateral LE arterial flow at the level of the popliteal artery - may be thrombus. Tentative plan for abdominal aortogram tomorrow.  For questions or updates, please contact  [facility] Please consult [email or web address] for contact info under   Chrystie Nose, MD, Milagros Loll  [place]   [facility]  Medical Director of the [facility]  [facility] Diplomate of the American Board of Clinical Lipidology Attending Cardiologist  Direct Dial: [phone]   Fax: [phone]  Website:  [email or web address]  Nada Boozer, NP  08/18/2019, 9:56 AM

## 2019-08-18 NOTE — Consult Note (Signed)
[facility]  Subjective  - a little more alert today, feet hurt   Objective 119/82 89 97.8 F (36.6 C) (Oral) 19 100%  Intake/Output Summary (Last 24 hours) at 08/18/2019 1719 Last data filed at 08/18/2019 1600 Gross per 24 hour  Intake 3586.72 ml  Output 1700 ml  Net 1886.72 ml   Feet unchanged from yesterday, toes cool bilat Skin slough right dorsal foot  Assessment/Planning: agram tomorrow High risk of limb loss Message left with son and husband regarding consent D/c heparin on call to cath lab tomorrow Npo p midnight  Ruta Hinds 08/18/2019 5:19 PM --  Laboratory Lab Results: Recent Labs    08/17/19 0439 08/18/19 0407  WBC 12.0* 12.2*  HGB 15.8* 14.5  HCT 45.8 42.1  PLT 106* 110*   BMET Recent Labs    08/17/19 0439 08/18/19 0407  NA 130* 132*  K 4.4 4.3  CL 100 100  CO2 18* 19*  GLUCOSE 135* 83  BUN 41* 39*  CREATININE 1.30* 1.48*  CALCIUM 8.0* 7.9*    COAG Lab Results  Component Value Date   INR 1.6 (H) 08/18/2019   INR 1.9 (H) 08/17/2019   INR 1.7 (H) 08/16/2019   No results found for: PTT

## 2019-08-18 NOTE — Progress Notes (Signed)
Family Update:  Primary RN spoke with patient sister Midge Minium twice during shift to give POC/status updates. Hospitalist called son and husband to get consent for Angiogram scheduled for 08/19/2019.

## 2019-08-18 NOTE — Progress Notes (Signed)
ANTICOAGULATION CONSULT NOTE  Pharmacy Consult:  Heparin Indication:  LV thrombus and CVA  Allergies  Allergen Reactions  . Penicillins Other (See Comments)    Unsure of reaction Did it involve swelling of the face/tongue/throat, SOB, or low BP? Unknown Did it involve sudden or severe rash/hives, skin peeling, or any reaction on the inside of your mouth or nose? Unknown Did you need to seek medical attention at a hospital or doctor's office? Unknown When did it last happen?[place] If all above answers are "NO", may proceed with cephalosporin use.    Patient Measurements: Height: 5\' 5"  (165.1 cm) Weight: 180 lb (81.6 kg) IBW/kg (Calculated) : 57 Heparin Dosing Weight: 75 kg  Vital Signs: Temp: 97.9 F (36.6 C) (09/20 2317) Temp Source: Oral (09/20 2317) BP: 135/93 (09/21 0313) Pulse Rate: 92 (09/21 0313)  Labs: Recent Labs    08/15/19 2350 08/16/19 0016 08/16/19 1030 08/16/19 1948 08/17/19 0439 08/17/19 1829 08/18/19 0407  HGB 16.9*  --   --   --  15.8*  --  14.5  HCT 48.6*  --   --   --  45.8  --  42.1  PLT 134*  --   --   --  106*  --  110*  APTT  --   --  31  --  33  --   --   LABPROT  --  20.1*  --   --  21.6*  --  19.2*  INR  --  1.7*  --   --  1.9*  --  1.6*  HEPARINUNFRC  --   --   --   --   --  <0.10* 0.16*  CREATININE 1.64*  --   --   --  1.30*  --   --   CKTOTAL  --   --   --  196  --   --   --     Estimated Creatinine Clearance: 52.2 mL/min (A) (by C-G formula based on SCr of 1.3 mg/dL (H)).  Assessment: 67 YOF with acute stroke and LV thrombus to continue on IV heparin.    Heparin level subtherapeutic at 0.16 s/p rate increase, no infusion issues per RN and rate confirmed.    Goal of Therapy:  Heparin level 0.3-0.5 units/ml Monitor platelets by anticoagulation protocol: Yes   Plan:  Increase heparin gtt to 1400 units/hr  F/u 6 hour heparin level  Bertis Ruddy, PharmD Clinical Pharmacist Please check AMION for all [place]  numbers 08/18/2019 5:48 AM

## 2019-08-19 ENCOUNTER — Encounter (HOSPITAL_COMMUNITY)
Admission: EM | Disposition: A | Discharge status: Skilled Nursing Facility | Payer: Self-pay | Source: Home / Self Care | Attending: Internal Medicine

## 2019-08-19 DIAGNOSIS — R17 Unspecified jaundice: Secondary | ICD-10-CM

## 2019-08-19 DIAGNOSIS — I739 Peripheral vascular disease, unspecified: Secondary | ICD-10-CM

## 2019-08-19 DIAGNOSIS — I63421 Cerebral infarction due to embolism of right anterior cerebral artery: Principal | ICD-10-CM

## 2019-08-19 DIAGNOSIS — E872 Acidosis: Secondary | ICD-10-CM

## 2019-08-19 HISTORY — PX: PERIPHERAL VASCULAR BALLOON ANGIOPLASTY: CATH118281

## 2019-08-19 HISTORY — PX: ABDOMINAL AORTOGRAM W/LOWER EXTREMITY: CATH118223

## 2019-08-19 LAB — URINE CULTURE: Culture: NO GROWTH

## 2019-08-19 LAB — HEPARIN LEVEL (UNFRACTIONATED): Heparin Unfractionated: 0.51 IU/mL (ref 0.30–0.70)

## 2019-08-19 LAB — COMPREHENSIVE METABOLIC PANEL
ALT: 35 U/L (ref 0–44)
AST: 48 U/L — ABNORMAL HIGH (ref 15–41)
Albumin: 1.3 g/dL — ABNORMAL LOW (ref 3.5–5.0)
Alkaline Phosphatase: 66 U/L (ref 38–126)
Anion gap: 11 (ref 5–15)
BUN: 47 mg/dL — ABNORMAL HIGH (ref 6–20)
CO2: 23 mmol/L (ref 22–32)
Calcium: 7.6 mg/dL — ABNORMAL LOW (ref 8.9–10.3)
Chloride: 100 mmol/L (ref 98–111)
Creatinine, Ser: 1.41 mg/dL — ABNORMAL HIGH (ref 0.44–1.00)
GFR calc Af Amer: 49 mL/min — ABNORMAL LOW (ref >60)
GFR calc non Af Amer: 42 mL/min — ABNORMAL LOW (ref >60)
Glucose, Bld: 98 mg/dL (ref 70–99)
Potassium: 2.7 mmol/L — CL (ref 3.5–5.1)
Sodium: 134 mmol/L — ABNORMAL LOW (ref 135–145)
Total Bilirubin: 9.3 mg/dL — ABNORMAL HIGH (ref 0.3–1.2)
Total Protein: 4.7 g/dL — ABNORMAL LOW (ref 6.5–8.1)

## 2019-08-19 LAB — CBC
HCT: 33 % — ABNORMAL LOW (ref 36.0–46.0)
Hemoglobin: 11.9 g/dL — ABNORMAL LOW (ref 12.0–15.0)
MCH: 35.6 pg — ABNORMAL HIGH (ref 26.0–34.0)
MCHC: 36.1 g/dL — ABNORMAL HIGH (ref 30.0–36.0)
MCV: 98.8 fL (ref 80.0–100.0)
Platelets: 107 10*3/uL — ABNORMAL LOW (ref 150–400)
RBC: 3.34 MIL/uL — ABNORMAL LOW (ref 3.87–5.11)
RDW: 18.9 % — ABNORMAL HIGH (ref 11.5–15.5)
WBC: 15.6 10*3/uL — ABNORMAL HIGH (ref 4.0–10.5)
nRBC: 1.3 % — ABNORMAL HIGH (ref 0.0–0.2)

## 2019-08-19 LAB — PATHOLOGIST SMEAR REVIEW: Path Review: INCREASED

## 2019-08-19 LAB — PROTIME-INR
INR: 1.6 — ABNORMAL HIGH (ref 0.8–1.2)
Prothrombin Time: 19.1 seconds — ABNORMAL HIGH (ref 11.4–15.2)

## 2019-08-19 LAB — PROTEIN S ACTIVITY: Protein S Activity: 56 % — ABNORMAL LOW (ref 63–140)

## 2019-08-19 LAB — PROTEIN C, TOTAL: Protein C, Total: 34 % — ABNORMAL LOW (ref 60–150)

## 2019-08-19 LAB — POCT ACTIVATED CLOTTING TIME: Activated Clotting Time: 362 seconds

## 2019-08-19 LAB — PROTEIN S, TOTAL: Protein S Ag, Total: 78 % (ref 60–150)

## 2019-08-19 LAB — PROTEIN C ACTIVITY: Protein C Activity: 37 % — ABNORMAL LOW (ref 73–180)

## 2019-08-19 SURGERY — ABDOMINAL AORTOGRAM W/LOWER EXTREMITY
Anesthesia: LOCAL

## 2019-08-19 MED ORDER — NITROGLYCERIN 1 MG/10 ML FOR IR/CATH LAB
INTRA_ARTERIAL | Status: AC
  Filled 2019-08-19: qty 10

## 2019-08-19 MED ORDER — SODIUM CHLORIDE 0.9% FLUSH
3.0000 mL | Freq: Two times a day (BID) | INTRAVENOUS | Status: DC
  Administered 2019-08-20: 13:00:00 3 mL via INTRAVENOUS

## 2019-08-19 MED ORDER — ACETAMINOPHEN 325 MG PO TABS: 650.0000 mg | ORAL_TABLET | ORAL | Status: DC | PRN

## 2019-08-19 MED ORDER — NITROGLYCERIN 1 MG/10 ML FOR IR/CATH LAB
INTRA_ARTERIAL | Status: DC | PRN
  Administered 2019-08-19: 800 ug via INTRA_ARTERIAL
  Administered 2019-08-19: 200 ug via INTRA_ARTERIAL
  Administered 2019-08-19 (×2): 800 ug via INTRA_ARTERIAL

## 2019-08-19 MED ORDER — ONDANSETRON HCL 4 MG/2ML IJ SOLN
4.0000 mg | Freq: Four times a day (QID) | INTRAMUSCULAR | Status: DC | PRN
  Administered 2019-08-19: 4 mg via INTRAVENOUS
  Filled 2019-08-19: qty 2

## 2019-08-19 MED ORDER — HEPARIN (PORCINE) IN NACL 1000-0.9 UT/500ML-% IV SOLN
INTRAVENOUS | Status: AC
  Filled 2019-08-19: qty 1000

## 2019-08-19 MED ORDER — POTASSIUM CHLORIDE CRYS ER 20 MEQ PO TBCR
20.0000 meq | EXTENDED_RELEASE_TABLET | Freq: Two times a day (BID) | ORAL | Status: AC
  Administered 2019-08-22 – 2019-08-23 (×4): 20 meq via ORAL
  Filled 2019-08-19 (×6): qty 1

## 2019-08-19 MED ORDER — IODIXANOL 320 MG/ML IV SOLN
INTRAVENOUS | Status: DC | PRN
  Administered 2019-08-19: 15:00:00 180 mL via INTRA_ARTERIAL

## 2019-08-19 MED ORDER — LIDOCAINE HCL (PF) 1 % IJ SOLN
INTRAMUSCULAR | Status: DC | PRN
  Administered 2019-08-19: 18 mL via INTRADERMAL

## 2019-08-19 MED ORDER — SODIUM CHLORIDE 0.9 % WEIGHT BASED INFUSION: 1.0000 mL/kg/h | INTRAVENOUS | Status: DC

## 2019-08-19 MED ORDER — HEPARIN SODIUM (PORCINE) 1000 UNIT/ML IJ SOLN
INTRAMUSCULAR | Status: AC
  Filled 2019-08-19: qty 1

## 2019-08-19 MED ORDER — HYDRALAZINE HCL 20 MG/ML IJ SOLN: 5.0000 mg | INTRAMUSCULAR | Status: DC | PRN

## 2019-08-19 MED ORDER — SODIUM CHLORIDE 0.9 % IV SOLN: 250.0000 mL | INTRAVENOUS | Status: DC | PRN

## 2019-08-19 MED ORDER — HEPARIN SODIUM (PORCINE) 1000 UNIT/ML IJ SOLN
INTRAMUSCULAR | Status: DC | PRN
  Administered 2019-08-19: 8000 [IU] via INTRAVENOUS

## 2019-08-19 MED ORDER — LABETALOL HCL 5 MG/ML IV SOLN: 10.0000 mg | INTRAVENOUS | Status: DC | PRN

## 2019-08-19 MED ORDER — FENTANYL CITRATE (PF) 100 MCG/2ML IJ SOLN
INTRAMUSCULAR | Status: DC | PRN
  Administered 2019-08-19 (×2): 25 ug via INTRAVENOUS

## 2019-08-19 MED ORDER — SODIUM CHLORIDE 0.9% FLUSH: 3.0000 mL | INTRAVENOUS | Status: DC | PRN

## 2019-08-19 MED ORDER — HEPARIN (PORCINE) IN NACL 1000-0.9 UT/500ML-% IV SOLN
INTRAVENOUS | Status: DC | PRN
  Administered 2019-08-19 (×2): 500 mL

## 2019-08-19 MED ORDER — POTASSIUM CHLORIDE CRYS ER 20 MEQ PO TBCR
40.0000 meq | EXTENDED_RELEASE_TABLET | Freq: Four times a day (QID) | ORAL | Status: AC
  Administered 2019-08-19: 11:00:00 40 meq via ORAL
  Filled 2019-08-19: qty 2

## 2019-08-19 MED ORDER — HEPARIN (PORCINE) 25000 UT/250ML-% IV SOLN
1750.0000 [IU]/h | INTRAVENOUS | Status: DC
  Administered 2019-08-19 – 2019-08-20 (×2): 1750 [IU]/h via INTRAVENOUS
  Filled 2019-08-19: qty 250

## 2019-08-19 MED ORDER — FENTANYL CITRATE (PF) 100 MCG/2ML IJ SOLN
INTRAMUSCULAR | Status: AC
  Filled 2019-08-19: qty 2

## 2019-08-19 MED ORDER — NITROGLYCERIN 0.4 MG SL SUBL
SUBLINGUAL_TABLET | SUBLINGUAL | Status: AC
  Filled 2019-08-19: qty 1

## 2019-08-19 MED ORDER — CHLORHEXIDINE GLUCONATE CLOTH 2 % EX PADS
6.0000 | MEDICATED_PAD | Freq: Every day | CUTANEOUS | Status: DC
  Administered 2019-08-19 – 2019-09-13 (×23): 6 via TOPICAL

## 2019-08-19 MED ORDER — POTASSIUM CHLORIDE 10 MEQ/100ML IV SOLN
10.0000 meq | INTRAVENOUS | Status: AC
  Administered 2019-08-19 (×4): 10 meq via INTRAVENOUS
  Filled 2019-08-19 (×4): qty 100

## 2019-08-19 MED ORDER — SODIUM CHLORIDE 0.9 % IV SOLN
INTRAVENOUS | Status: DC
  Administered 2019-08-19: 07:00:00 via INTRAVENOUS

## 2019-08-19 MED ORDER — LIDOCAINE HCL (PF) 1 % IJ SOLN
INTRAMUSCULAR | Status: AC
  Filled 2019-08-19: qty 30

## 2019-08-19 SURGICAL SUPPLY — 24 items
BAG SNAP BAND KOVER 36X36 (MISCELLANEOUS) ×6 IMPLANT
BALLN STERLING OTW 3X220X150 (BALLOONS) ×3
BALLN STERLING OTW 4X40X135 (BALLOONS) ×3
BALLOON STERLING OTW 3X220X150 (BALLOONS) ×2 IMPLANT
BALLOON STERLING OTW 4X40X135 (BALLOONS) ×2 IMPLANT
CANISTER PENUMBRA ENGINE (MISCELLANEOUS) ×3 IMPLANT
CATH INDIGO CAT6 KIT (CATHETERS) ×3 IMPLANT
CATH OMNI FLUSH 5F 65CM (CATHETERS) ×3 IMPLANT
CATH QUICKCROSS .018X135CM (MICROCATHETER) ×3 IMPLANT
CLOSURE MYNX CONTROL 6F/7F (Vascular Products) ×3 IMPLANT
COVER DOME SNAP 22 D (MISCELLANEOUS) ×3 IMPLANT
KIT ENCORE 26 ADVANTAGE (KITS) ×3 IMPLANT
KIT MICROPUNCTURE NIT STIFF (SHEATH) ×3 IMPLANT
KIT PV (KITS) ×3 IMPLANT
SHEATH DESTINATION 7FR 90 (SHEATH) ×3 IMPLANT
SHEATH PINNACLE 5F 10CM (SHEATH) ×3 IMPLANT
SHEATH PINNACLE 7F 10CM (SHEATH) ×3 IMPLANT
SHIELD RADPAD SCOOP 12X17 (MISCELLANEOUS) ×3 IMPLANT
SYR MEDRAD MARK V 150ML (SYRINGE) ×3 IMPLANT
TRANSDUCER W/STOPCOCK (MISCELLANEOUS) ×3 IMPLANT
TRAY PV CATH (CUSTOM PROCEDURE TRAY) ×3 IMPLANT
WIRE BENTSON .035X145CM (WIRE) ×3 IMPLANT
WIRE G V18X300CM (WIRE) ×3 IMPLANT
WIRE SPARTACORE .014X300CM (WIRE) ×3 IMPLANT

## 2019-08-19 NOTE — Op Note (Signed)
Patient name: Briana Andrews MRN: [id number] DOB: May 11, 1966 Sex: female  08/19/2019 Pre-operative Diagnosis: Bilateral lower extremity ischemia Post-operative diagnosis:  Same Surgeon:  Annamarie Major Procedure Performed:  1.  Ultrasound-guided access, left femoral artery  2.  Abdominal aortogram  3.  Bilateral lower extremity runoff  4.  Mechanical thrombectomy of the right superficial femoral, popliteal, and posterior tibial artery using the penumbra CAT 6 device  5.  Angioplasty of the right superficial femoral-popliteal and posterior tibial artery  6.  Intra-arterial administration of nitroglycerin  7.  Closure device (Mynx)   Indications: Patient is suffering from cirrhosis.  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  She comes in today for evaluation and possible intervention  Procedure:  The patient was identified in the holding area and taken to room 8.  The patient was then placed supine on the table and prepped and draped in the usual sterile fashion.  A time out was called.    Ultrasound was used to evaluate the right common femoral artery.  It was patent .  A digital ultrasound image was acquired.  A micropuncture needle was used to access the right common femoral artery under ultrasound guidance.  An 018 wire was advanced without resistance and a micropuncture sheath was placed.  The 018 wire was removed and a benson wire was placed.  The micropuncture sheath was exchanged for a 5 french sheath.  An omniflush catheter was advanced over the wire to the level of L-1.  An abdominal angiogram was obtained.  Next, the catheter was pulled out of aorta bifurcation bilateral upper  Findings:   Aortogram: No significant renal artery stenosis was identified.  The infrarenal abdominal aorta is widely patent.  Bilateral common and external iliac arteries widely patent.  Right Lower Extremity: Right common femoral profundofemoral and superficial femoral  artery are widely patent but small in caliber.  There is occlusion of popliteal artery with reconstitution of a diminutive posterior tibial artery.  Left Lower Extremity: The left common femoral profundofemoral superficial femoral artery widely patent.  Popliteal artery is occluded.  It appears to be two-vessel runoff with the dominant being the posterior tibial  Intervention: After the above images were acquired the decision was made to proceed with intervention.  A 7 French 90 cm sheath was advanced into the right superficial femoral artery and the patient was fully heparinized.  I then used a 018 quick cross and a V-18 wire to cross the lesions and get wire access into the posterior tibial artery.  I then selected the penumbra CAT 6 device and perform mechanical thrombectomy using multiple passes through the superficial femoral-popliteal and posterior tibial artery.  Post mechanical thrombectomy imaging revealed that there was a 80% stenosis in the lesion within the adductor canal.  There also appeared to be luminal irregularity in the popliteal artery and proximal posterior tibial artery.  Therefore elected to treat this with balloon angioplasty given the size of her vessels.  A 3 x 220 Sterling balloon was used to perform balloon angioplasty of the posterior tibial popliteal superficial femoral artery.  Follow-up imaging showed significantly improved result however there was persistent spasm within the posterior tibial artery and the lesion in the superficial femoral artery had not fully resolved.  I therefore elected to upsize to a 4 x 40 balloon to treat the lesion in the superficial femoral artery.  This was taken to nominal pressure for 2 minutes and subsequent imaging revealed  significant improvement with the stenosis less than 10%.  There was still persistent spasm within the posterior tibial artery despite intra-arterial administration of nitroglycerin and so elected to repeat the balloon angioplasty  of this area using a 3 mm balloon.  After taking the balloon to nominal pressure for 2 minutes, there was still persistent spasm.  I injected another 800 mcg of nitroglycerin directly into the posterior tibial artery with subsequent imaging showing persistent spasm.  I did not want to intervene anymore as I felt that this would resolve with time.  Therefore elected to stop the procedure at this time.  The access site was closed with a minx without complication  Impression:  #1  Right popliteal artery occlusion successfully treated with mechanical thrombectomy of the superficial femoral-popliteal and posterior tibial artery and subsequent balloon angioplasty using a 3 mm balloon.  There was a area in the adductor canal that was treated with a 4 mm balloon.  #2  Left popliteal artery occlusion.  This could potentially be treated in similar fashion when she recovers from this procedure.  #3  Persistent spasm in the right posterior tibial artery after intervention.    Juleen China, M.D., [facility] [place] Office: [phone] Pager:  [phone]

## 2019-08-19 NOTE — Progress Notes (Signed)
Pharmacy Antibiotic Note  Briana Andrews is a 53 y.o. female admitted on 08/15/2019 with pneumonia.  Pharmacy has been consulted for Levaquin dosing.  Today is day #4 of therapy.  Renal function is improving slowly.  Afebrile, WBC up to 15.6.  Plan: Continue Levaquin 750mg  PO Q48H Monitor renal fxn, clinical progress, abx LOT   Height: 5\' 5"  (165.1 cm) Weight: 180 lb (81.6 kg) IBW/kg (Calculated) : 57  Temp (24hrs), Avg:97.9 F (36.6 C), Min:97.7 F (36.5 C), Max:98.2 F (36.8 C)  Recent Labs  Lab 08/15/19 2350 08/16/19 0400 08/17/19 0439 08/18/19 0407 08/19/19 0425  WBC 9.7  --  12.0* 12.2* 15.6*  CREATININE 1.64*  --  1.30* 1.48* 1.41*  LATICACIDVEN 2.8* 2.0*  --   --   --     Estimated Creatinine Clearance: 48.1 mL/min (A) (by C-G formula based on SCr of 1.41 mg/dL (H)).    Allergies  Allergen Reactions  . Penicillins Other (See Comments)    Unsure of reaction Did it involve swelling of the face/tongue/throat, SOB, or low BP? Unknown Did it involve sudden or severe rash/hives, skin peeling, or any reaction on the inside of your mouth or nose? Unknown Did you need to seek medical attention at a hospital or doctor's office? Unknown When did it last happen?[place] If all above answers are "NO", may proceed with cephalosporin use.    LVQ 9/19 >>   9/19 covid - negative 9/19 BCx - NGTD 9/19 resp panel PCR - negative 9/21 UCx - negative  Ebba Goll D. Mina Marble, PharmD, BCPS, [place] 08/19/2019, 12:30 PM

## 2019-08-19 NOTE — Progress Notes (Signed)
PROGRESS NOTE    Briana Andrews  ZOX:[id number] DOB: 31-Oct-1966 DOA: 08/15/2019 PCP: Patient, [name]    Brief Narrative:   53 year old female with history of smoking, alcohol use, drinking more than 6 packs of beer every day since last 20 years, no chronic medical issues brought to the emergency room with confusion more than usual and falls. According to the patient's husband, she has been doing well until this summer.  Since last few months they have been noticing some changes on her including weakness on her legs and poor appetite as much as she stopped smoking and decreased her drinking.  2 weeks ago, she was taken to [facility] with a fall, skeletal survey was negative except L1 fracture with 25% height loss and discharged home on muscle relaxants.  Patient has been intermittently confused since then.  After coming to the emergency room she was found with multiple problems as below. In the emergency room, she was found with subacute right ACA stroke, jaundice with bilirubin of 12, bilateral multifocal pneumonia and metabolic encephalopathy and bilateral ischemic legs.   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  Also suspect noninfectious cause, may be related to CHF. Chest x-ray consistent with multiple area opacities.  WBC count was normal.  Lactic acid was elevated, however also has liver disease.  Started on Levaquin that we will continue for 3/5 days total. Cultures are negative. Oxygen as needed.   Bronchodilator as needed.  Subacute ischemic stroke, right ACA with encephalopathy: Patient was not a TPA and vascular intervention candidate because of unknown duration of symptoms. Statin, contraindicated due to liver disease. Consultations, neurology, speech, PT OT MRI of the brain shows right ACA territory stroke, no hemorrhage. carotid ultrasound, no thrombosis.  Less than 39% stenosis. 2D echocardiogram, ejection fraction 15% with left ventricular thrombus. Lipid panel and A1c normal. Now on heparin as she also has evidence of systemic thromboembolism.  Abnormal LFTs: Suspect chronic alcoholic liver disease.  Transaminases are minimally elevated.  CT scan abdomen with no evidence of biliary abnormalities.  Symptomatic treatment with IV fluids.  Less likely hemochromatosis. Seen by GI, appreciate their input.  Started on rifaximin and lactulose. Duplex hepatic system negative for hepatic portal or splenic venous thrombosis or occlusion. Will closely monitor with daily LFTs and electrolytes, will check INR in the morning.  Bilateral lower leg ischemia : Severe peripheral arterial disease.  Likely systemic thromboembolism.  Arterial scan showed absent flow bilateral popliteal artery.  Seen by vascular surgery. Now on heparin. Plan as per vascualr surgery.  For potential aortogram today. 08/19/2019: Her left foot is somehow improving in color today.  Alcoholism / encephalopathy:  Patient is at risk of withdrawal, however she is too lethargic and sick looking.  Treating with high-dose thiamine, will continue for day 3/5 days.    New diagnosed acute systolic heart failure, left ventricular thrombus: Ejection fraction 15%.  Patient is not clinically stable to undergo ischemic evaluation or cardiac catheterization.  Seen by cardiology, started  on IV Lasix.  Symptomatic treatment.  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 Will need systemic anticoagulation, preferably warfarin for intraventricular  thrombus once patient is stable and not needing any procedures.  Acute urinary retention: Patient continued to retain urine, more than 600 mL on bladder scan.  Needing close monitoring up intake and output.  Maintain Foley catheter until clinical improvement and ready to have voiding trial.  Hypokalemia: Low potassium levels.  Replaced IV and oral.  Recheck levels in the morning.  Will check magnesium and phosphorus.  Keep on a schedule potassium replacement as she is also on IV Lasix.  Called and updated patient's son.  Her husband is at work and does not have a cell phone.  Patient's husband and her adult son has consented for any procedure including aortogram as needed on my conversation.  Patient also has poor prognosis.  Family is well aware about ongoing conditions.  DVT prophylaxis: heparin infusion. Code Status: Full code Family Communication: Son on the phone. Disposition Plan: Unknown at this time. Critically sick.  May go to acute rehab once stabilized.   Consultants:   Gastroenterology  Neurology  Vascular surgery  Procedures:   None  Antimicrobials:   Levaquin, 08/16/2019---   Subjective: Patient seen and examined.  No overnight events.  Still remains lethargic.  Denies any nausea vomiting.  Short of breath on attempted ambulation.  Objective: Vitals:   08/18/19 2032 08/19/19 0040 08/19/19 0423 08/19/19 0809  BP: 110/66 (!) 116/59 (!) 93/46 93/63  Pulse: 85 86 85 82  Resp: 20 20 20 15   Temp: 98.1 F (36.7 C) 98.2 F (36.8 C) 97.9 F (36.6 C) 98 F (36.7 C)  TempSrc: Oral Oral Oral Oral  SpO2: 94% 98% 98% 99%  Weight:      Height:        Intake/Output Summary (Last 24 hours) at 08/19/2019 1045 Last data filed at 08/19/2019 0658 Gross per 24 hour  Intake 2989.97 ml  Output 3350 ml  Net -360.03 ml   Filed Weights   08/15/19 2345  Weight: 81.6 kg    Examination:  General exam: Appears calm and quiet but sick looking.  Jaundiced. Respiratory  system:  Respiratory effort normal.  Poor air entry at bases. Cardiovascular system: S1 & S2 heard, RRR. No JVD, murmurs, rubs, gallops or clicks. No pedal edema. Gastrointestinal system: Abdomen is nondistended, soft and nontender. No organomegaly or masses felt. Normal bowel sounds heard.  No ascites.  Foley catheter with dark urine. Central nervous system: Alert and awake and oriented x3.  Does not have any tremors.  She was able to move all extremities. Extremities: Symmetric 5 x 5 power.  Generalized weakness.  She has no focal neurological deficits. Skin: No rashes, skin sloughing and abrasion right dorsum of the foot. Psychiatry: Judgement and insight appear compromised.  mood & affect flat.  Bilateral lower extremity: Dusky discoloration of the tips of the toes mostly on the plantar surface, Today, her left foot is not as cold on touch, Doppler pulses are present.    Data Reviewed: I have personally reviewed following labs and imaging studies  CBC: Recent Labs  Lab 08/15/19 2350 08/16/19 1249 08/17/19 0439 08/18/19 0407 08/19/19 0425  WBC 9.7  --  12.0* 12.2* 15.6*  NEUTROABS 7.7  --   --   --   --   HGB 16.9*  --  15.8* 14.5 11.9*  HCT 48.6* 44.5 45.8 42.1 33.0*  MCV 100.8*  --  101.6* 101.7* 98.8  PLT 134*  --  106* 110* 107*   Basic Metabolic Panel: Recent Labs  Lab 08/15/19 2350 08/17/19 0439 08/18/19 0407 08/19/19 0425  NA 133* 130* 132* 134*  K 4.1 4.4 4.3 2.7*  CL 97* 100 100 100  CO2 21* 18* 19* 23  GLUCOSE 119* 135* 83 98  BUN 47* 41* 39* 47*  CREATININE 1.64* 1.30* 1.48* 1.41*  CALCIUM 8.7* 8.0* 7.9* 7.6*  MG  --   --  1.9  --    GFR: Estimated Creatinine Clearance: 48.1 mL/min (A) (by C-G formula based on SCr of 1.41 mg/dL (H)). Liver Function Tests: Recent Labs  Lab 08/15/19 2350 08/17/19 0439 08/18/19 0407 08/19/19 0425  AST 59* 54* 69* 48*  ALT 43 39 38 35  ALKPHOS 83 76 75 66  BILITOT 12.7* 12.1* 11.8* 9.3*  PROT 7.7 6.5 5.9* 4.7*    ALBUMIN 2.4* 1.9* 1.7* 1.3*   Recent Labs  Lab 08/15/19 2350  LIPASE 49   Recent Labs  Lab 08/15/19 2350 08/17/19 0439 08/18/19 0407  AMMONIA 18 39* 42*   Coagulation Profile: Recent Labs  Lab 08/16/19 0016 08/17/19 0439 08/18/19 0407 08/19/19 0425  INR 1.7* 1.9* 1.6* 1.6*   Cardiac Enzymes: Recent Labs  Lab 08/16/19 1948  CKTOTAL 196   BNP (last 3 results) No results for input(s): PROBNP in the last 8760 hours. HbA1C: Recent Labs    08/17/19 0439  HGBA1C 5.8*   CBG: Recent Labs  Lab 08/16/19 1028  GLUCAP 105*   Lipid Profile: Recent Labs    08/17/19 0439  CHOL 93  HDL <10*  LDLCALC NOT CALCULATED  TRIG 177*  CHOLHDL NOT CALCULATED   Thyroid Function Tests: No results for input(s): TSH, T4TOTAL, FREET4, T3FREE, THYROIDAB in the last 72 hours. Anemia Panel: Recent Labs    08/16/19 1249  VITAMINB12 1,894*  FERRITIN 429*  TIBC 216*  IRON 84   Sepsis Labs: Recent Labs  Lab 08/15/19 2350 08/16/19 0400  LATICACIDVEN 2.8* 2.0*    Recent Results (from the past 240 hour(s))  SARS Coronavirus 2 [facility](Hospital order, Performed in [facility] hospital lab) Nasopharyngeal Nasopharyngeal Swab     Status: None   Collection Time: 08/16/19  3:03 AM   Specimen: Nasopharyngeal Swab  Result Value Ref Range Status   SARS Coronavirus 2 NEGATIVE NEGATIVE Final    Comment: (NOTE) If result is NEGATIVE SARS-CoV-2 target nucleic acids are NOT DETECTED. The SARS-CoV-2 RNA is generally detectable in upper and lower  respiratory specimens during the acute phase of infection. The lowest  concentration of SARS-CoV-2 viral copies this assay can detect is 250  copies / mL. A negative result does not preclude SARS-CoV-2 infection  and should not be used as the sole basis for treatment or other  patient management decisions.  A negative result may occur with  improper specimen collection / handling, submission of specimen other  than nasopharyngeal swab, presence of  viral mutation(s) within the  areas targeted by this assay, and inadequate number of viral copies  (<250 copies / mL). A negative result must be combined with clinical  observations, patient history, and epidemiological information. If result is POSITIVE SARS-CoV-2 target nucleic acids are DETECTED. The SARS-CoV-2 RNA is generally detectable in upper and lower  respiratory specimens dur ing the acute phase of infection.  Positive  results are indicative of active infection with SARS-CoV-2.  Clinical  correlation with patient history and other diagnostic information is  necessary to determine patient infection status.  Positive results do  not rule out bacterial infection or co-infection with other viruses. If result is PRESUMPTIVE POSTIVE SARS-CoV-2 nucleic acids MAY BE PRESENT.   A presumptive positive result was obtained on the submitted specimen  and confirmed on repeat testing.  While 2019 novel coronavirus  (SARS-CoV-2) nucleic acids may be present in the submitted sample  additional confirmatory testing may be necessary for epidemiological  and / or clinical management purposes  to differentiate between  SARS-CoV-2 and other Sarbecovirus currently known to infect humans.  If clinically indicated additional testing with an alternate test  methodology [phone]([id number]) is advised. The SARS-CoV-2 RNA is generally  detectable in upper and lower respiratory sp ecimens during the acute  phase of infection. The expected result is Negative. Fact Sheet for Patients:  BoilerBrush.com.cyhttps://www.fda.gov/media/136312/download Fact Sheet for Healthcare Providers: https://pope.com/https://www.fda.gov/media/136313/download This test is not yet approved or cleared by the [place] FDA and has been authorized for detection and/or diagnosis of SARS-CoV-2 by FDA under an Emergency Use Authorization (EUA).  This EUA will remain in effect (meaning this test can be used) for the duration of the COVID-19 declaration under Section  564(b)(1) of the Act, 21 U.S.C. section 360bbb-3(b)(1), unless the authorization is terminated or revoked sooner. Performed at [facility], [address] [place], [place] [address]   Culture, blood (routine x 2) Call MD if unable to obtain prior to antibiotics being given     Status: None (Preliminary result)   Collection Time: 08/16/19 10:16 AM   Specimen: BLOOD RIGHT WRIST  Result Value Ref Range Status   Specimen Description BLOOD RIGHT WRIST  Final   Special Requests   Final    BOTTLES DRAWN AEROBIC AND ANAEROBIC Blood Culture adequate volume   Culture   Final    NO GROWTH 3 DAYS Performed at [facility], [address] [place], [place] [address]    Report Status PENDING  Incomplete  Culture, blood (routine x 2) Call MD if unable to obtain prior to antibiotics being given     Status: None (Preliminary result)   Collection Time: 08/16/19 10:51 AM   Specimen: BLOOD  Result Value Ref Range Status   Specimen Description BLOOD RIGHT ANTECUBITAL  Final   Special Requests   Final    BOTTLES DRAWN AEROBIC AND ANAEROBIC Blood Culture results may not be optimal due to an excessive volume of blood received in culture bottles   Culture   Final    NO GROWTH 3 DAYS Performed at [facility], [address] [place], [place] [address]    Report Status PENDING  Incomplete  Respiratory Panel by PCR     Status: None   Collection Time: 08/16/19 11:40 AM   Specimen: Nasopharyngeal Swab; Respiratory  Result Value Ref Range Status   Adenovirus NOT DETECTED NOT DETECTED Final   Coronavirus 229E NOT DETECTED NOT DETECTED Final    Comment: (NOTE) The Coronavirus on the Respiratory Panel, DOES NOT test for the novel  Coronavirus (2019 nCoV)    Coronavirus HKU1 NOT DETECTED NOT DETECTED Final   Coronavirus NL63 NOT DETECTED NOT DETECTED Final   Coronavirus OC43 NOT DETECTED NOT DETECTED Final   Metapneumovirus NOT DETECTED NOT DETECTED Final   Rhinovirus / Enterovirus NOT  DETECTED NOT DETECTED Final   Influenza A NOT DETECTED NOT DETECTED Final   Influenza B NOT DETECTED NOT DETECTED Final   Parainfluenza Virus 1 NOT DETECTED NOT DETECTED Final   Parainfluenza Virus 2 NOT DETECTED  NOT DETECTED Final   Parainfluenza Virus 3 NOT DETECTED NOT DETECTED Final   Parainfluenza Virus 4 NOT DETECTED NOT DETECTED Final   Respiratory Syncytial Virus NOT DETECTED NOT DETECTED Final   Bordetella pertussis NOT DETECTED NOT DETECTED Final   Chlamydophila pneumoniae NOT DETECTED NOT DETECTED Final   Mycoplasma pneumoniae NOT DETECTED NOT DETECTED Final    Comment: Performed at [facility], [place] [address] [place], [place] [address]         Radiology Studies: US Liver Doppler  Result Date: 08/18/2019 CLINICAL DATA:  Jaundice. EXAM: DUPLEX ULTRASOUND OF LIVER TECHNIQUE: Color and duplex Doppler ultrasound was performed to evaluate the hepatic in-flow and out-flow vessels. COMPARISON:  CT scan of August 16, 2019. FINDINGS: Liver: Normal parenchymal echogenicity. Normal hepatic contour without nodularity. No focal lesion, mass or intrahepatic biliary ductal dilatation. Main Portal Vein size: 1.0 cm Portal Vein Velocities Main Prox:  17 cm/sec Main Mid: 13 cm/sec Main Dist:  12 cm/sec Right: 9 cm/sec Left: 10 cm/sec Normal hepatopetal flow is noted in the portal veins. Hepatic Vein Velocities Right:  28 cm/sec Middle:  29 cm/sec Left:  32 cm/sec Normal hepatofugal flow is noted in the hepatic veins. IVC: Present and patent with normal respiratory phasicity. Hepatic Artery Velocity:  44 cm/sec Splenic Vein Velocity:  22 cm/sec Spleen: 9.1 cm x 0.6 cm x 8.3 cm with a total volume of 140 cm^3 (411 cm^3 is upper limit normal) Portal Vein Occlusion/Thrombus: No Splenic Vein Occlusion/Thrombus: No Ascites: None Varices: None IMPRESSION: No evidence of hepatic, portal or splenic venous thrombosis or occlusion is noted. Electronically Signed   By: Marijo Conception M.D.   On:  08/18/2019 07:28   Vas US Carotid  Result Date: 08/19/2019 Carotid Arterial Duplex Study Indications:       CVA. Risk Factors:      Current smoker. Limitations        Today's exam was limited due to the patient's inability or                    unwillingness to cooperate. Comparison Study:  no prior Performing Technologist: June Leap RDMS, RVT  Examination Guidelines: A complete evaluation includes B-mode imaging, spectral Doppler, color Doppler, and power Doppler as needed of all accessible portions of each vessel. Bilateral testing is considered an integral part of a complete examination. Limited examinations for reoccurring indications may be performed as noted.  Right Carotid Findings: +----------+--------+--------+--------+------------------+--------+             PSV cm/s EDV cm/s Stenosis Plaque Description Comments  +----------+--------+--------+--------+------------------+--------+  CCA Prox   44       12                                             +----------+--------+--------+--------+------------------+--------+  CCA Distal 65       16                                             +----------+--------+--------+--------+------------------+--------+  ICA Prox   33       11       1-39%    heterogenous                 +----------+--------+--------+--------+------------------+--------+  ICA Distal 41       15                                             +----------+--------+--------+--------+------------------+--------+  ECA        55       8                                              +----------+--------+--------+--------+------------------+--------+ +----------+--------+-------+----------------+-------------------+             PSV cm/s EDV cms Describe         Arm Pressure (mmHG)  +----------+--------+-------+----------------+-------------------+  Subclavian 89               Multiphasic, WNL                      +----------+--------+-------+----------------+-------------------+  +---------+--------+--+--------+-+---------+  Vertebral PSV cm/s 33 EDV cm/s 7 Antegrade  +---------+--------+--+--------+-+---------+  Left Carotid Findings: +----------+--------+--------+--------+------------------+--------+             PSV cm/s EDV cm/s Stenosis Plaque Description Comments  +----------+--------+--------+--------+------------------+--------+  CCA Prox   77       20                                             +----------+--------+--------+--------+------------------+--------+  CCA Distal 46       15                                             +----------+--------+--------+--------+------------------+--------+  ICA Prox   47       20       1-39%    heterogenous                 +----------+--------+--------+--------+------------------+--------+  ICA Distal 61       25                                             +----------+--------+--------+--------+------------------+--------+  ECA        60       11                                             +----------+--------+--------+--------+------------------+--------+ +----------+--------+--------+----------------+-------------------+             PSV cm/s EDV cm/s Describe         Arm Pressure (mmHG)  +----------+--------+--------+----------------+-------------------+  Subclavian 73                Multiphasic, WNL                      +----------+--------+--------+----------------+-------------------+ +---------+--------+--+--------+-+---------+  Vertebral PSV cm/s 29 EDV cm/s 8 Antegrade  +---------+--------+--+--------+-+---------+  Summary: Right Carotid: Velocities in the right ICA are consistent with  a 1-39% stenosis. Left Carotid: Velocities in the left ICA are consistent with a 1-39% stenosis. Vertebrals:  Bilateral vertebral arteries demonstrate antegrade flow. Subclavians: Normal flow hemodynamics were seen in bilateral subclavian              arteries. *See table(s) above for measurements and observations.  Electronically signed by Delia Heady MD  on 08/19/2019 at 8:08:58 AM.    Final    Vas [place] Lower Extremity Arterial Duplex  Result Date: 08/18/2019 LOWER EXTREMITY ARTERIAL DUPLEX STUDY Other Factors: Leg weakness and discoloration, Afib.  Current ABI: No ABI due to absent pedal flow to obtain pressure Limitations: Poor image quality due to sunlight in patient's room Comparison Study: no prior Performing Technologist: Jeb Levering RDMS, RVT  Examination Guidelines: A complete evaluation includes B-mode imaging, spectral Doppler, color Doppler, and power Doppler as needed of all accessible portions of each vessel. Bilateral testing is considered an integral part of a complete examination. Limited examinations for reoccurring indications may be performed as noted.  +-----------+--------+-----+--------+----------+------------------+  RIGHT       PSV cm/s Ratio Stenosis Waveform   Comments            +-----------+--------+-----+--------+----------+------------------+  CFA Prox    57                      triphasic                      +-----------+--------+-----+--------+----------+------------------+  DFA         43                      triphasic                      +-----------+--------+-----+--------+----------+------------------+  SFA Prox    61                      triphasic                      +-----------+--------+-----+--------+----------+------------------+  SFA Mid     54                      triphasic                      +-----------+--------+-----+--------+----------+------------------+  SFA Distal  34                      triphasic                      +-----------+--------+-----+--------+----------+------------------+  POP Prox    25                      monophasic thrombus vs plaque  +-----------+--------+-----+--------+----------+------------------+  POP Mid                    occluded                                +-----------+--------+-----+--------+----------+------------------+  POP Distal                 occluded                                 +-----------+--------+-----+--------+----------+------------------+  ATA Distal  7                       monophasic severely dampened   +-----------+--------+-----+--------+----------+------------------+  PTA Distal                 occluded                                +-----------+--------+-----+--------+----------+------------------+  PERO Distal 12                      monophasic severely dampened   +-----------+--------+-----+--------+----------+------------------+  +-----------+--------+-----+--------+----------+------------------+  LEFT        PSV cm/s Ratio Stenosis Waveform   Comments            +-----------+--------+-----+--------+----------+------------------+  CFA Prox    64                      triphasic                      +-----------+--------+-----+--------+----------+------------------+  DFA         67                      triphasic                      +-----------+--------+-----+--------+----------+------------------+  SFA Prox    65                      triphasic                      +-----------+--------+-----+--------+----------+------------------+  SFA Mid     74                      triphasic                      +-----------+--------+-----+--------+----------+------------------+  SFA Distal  40                      triphasic                      +-----------+--------+-----+--------+----------+------------------+  POP Prox    21                      biphasic                       +-----------+--------+-----+--------+----------+------------------+  POP Mid                    occluded            partially occluded  +-----------+--------+-----+--------+----------+------------------+  POP Distal                 occluded            thrombus vs plaque  +-----------+--------+-----+--------+----------+------------------+  ATA Distal  6                       monophasic severely dampened   +-----------+--------+-----+--------+----------+------------------+  PTA Distal  11                       monophasic severely dampened   +-----------+--------+-----+--------+----------+------------------+  PERO Distal 8  monophasic severely dampened   +-----------+--------+-----+--------+----------+------------------+  Arterial Summary: 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 Incidental finding Right popliteal vein DVT. 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  See table(s) above for measurements and observations. Electronically signed by Sherald Hess MD on 08/18/2019 at 4:25:28 PM.    Final         Scheduled Meds:  digoxin  0.125 mg Oral Daily   folic acid  1 mg Oral Daily   furosemide  40 mg Intravenous Daily   lactulose  10 g Oral BID   levofloxacin  750 mg Oral Q48H   multivitamin with minerals  1 tablet Oral Daily   nicotine  21 mg Transdermal Daily   potassium chloride  40 mEq Oral Q6H   rifaximin  550 mg Oral BID   spironolactone  12.5 mg Oral Daily   Continuous Infusions:  sodium chloride 100 mL/hr at 08/19/19 0633   heparin 1,800 Units/hr (08/18/19 2257)   potassium chloride 10 mEq (08/19/19 1038)   thiamine injection 500 mg (08/18/19 1131)     LOS: 3 days    Time spent: 30 minutes    Dorcas Carrow, MD [facility] Pager [phone]  If 7PM-7AM, please contact night-coverage [email or web address] Password [facility] 08/19/2019, 10:45 AM

## 2019-08-19 NOTE — Progress Notes (Signed)
Inpatient Rehabilitation Admissions Coordinator  Inpatient rehab consult received. No family at bedside and pt not alert enough for full assessment, Noted plans for angio today. I will follow up with family and patient for rehab assessment.  Danne Baxter, RN, MSN Rehab Admissions Coordinator [phone] 08/19/2019 10:28 AM

## 2019-08-19 NOTE — Progress Notes (Signed)
Progress Note  Patient Name: Briana Andrews Date of Encounter: 08/19/2019  Primary Cardiologist: Tobias Alexander, MD   Subjective   No chest pain some dyspnea more with exertion. Some awareness of palpatations.  Awaiting PV angio     Inpatient Medications    Scheduled Meds:  digoxin  0.125 mg Oral Daily   folic acid  1 mg Oral Daily   furosemide  40 mg Intravenous Daily   lactulose  10 g Oral BID   levofloxacin  750 mg Oral Q48H   multivitamin with minerals  1 tablet Oral Daily   nicotine  21 mg Transdermal Daily   potassium chloride  40 mEq Oral Q6H   rifaximin  550 mg Oral BID   spironolactone  12.5 mg Oral Daily   Continuous Infusions:  sodium chloride 100 mL/hr at 08/19/19 0633   dextrose 5 % and 0.9% NaCl 100 mL/hr at 08/18/19 2156   heparin 1,800 Units/hr (08/18/19 2257)   potassium chloride 10 mEq (08/19/19 0907)   thiamine injection 500 mg (08/18/19 1131)   PRN Meds: albuterol, dextromethorphan-guaiFENesin, methocarbamol, ondansetron **OR** ondansetron (ZOFRAN) IV, oxyCODONE   Vital Signs    Vitals:   08/18/19 2032 08/19/19 0040 08/19/19 0423 08/19/19 0809  BP: 110/66 (!) 116/59 (!) 93/46 93/63  Pulse: 85 86 85 82  Resp: 20 20 20 15   Temp: 98.1 F (36.7 C) 98.2 F (36.8 C) 97.9 F (36.6 C) 98 F (36.7 C)  TempSrc: Oral Oral Oral Oral  SpO2: 94% 98% 98% 99%  Weight:      Height:        Intake/Output Summary (Last 24 hours) at 08/19/2019 1020 Last data filed at 08/19/2019 0658 Gross per 24 hour  Intake 2989.97 ml  Output 3350 ml  Net -360.03 ml   Last 3 Weights 08/15/2019 08/04/2019  Weight (lbs) 180 lb 180 lb  Weight (kg) 81.647 kg 81.647 kg      Telemetry    SR with NSVT and SVT - Personally Reviewed  ECG    No new - Personally Reviewed  Physical Exam   GEN: No acute distress.   Neck: No JVD at 20 degrees Cardiac: RRR, no murmurs, rubs, or gallops.  Respiratory: Clear to auscultation bilaterally.ant.  GI: Soft, mild  tenderness, non-distended  MS: mild lower ext edema; No deformity. Neuro:  Nonfocal , follows commands and MAE but slow to respond at times  Psych: Normal affect   Labs    High Sensitivity Troponin:  No results for input(s): TROPONINIHS in the last 720 hours.    Chemistry Recent Labs  Lab 08/17/19 0439 08/18/19 0407 08/19/19 0425  NA 130* 132* 134*  K 4.4 4.3 2.7*  CL 100 100 100  CO2 18* 19* 23  GLUCOSE 135* 83 98  BUN 41* 39* 47*  CREATININE 1.30* 1.48* 1.41*  CALCIUM 8.0* 7.9* 7.6*  PROT 6.5 5.9* 4.7*  ALBUMIN 1.9* 1.7* 1.3*  AST 54* 69* 48*  ALT 39 38 35  ALKPHOS 76 75 66  BILITOT 12.1* 11.8* 9.3*  GFRNONAA 46* 40* 42*  GFRAA 54* 46* 49*  ANIONGAP 12 13 11      Hematology Recent Labs  Lab 08/17/19 0439 08/18/19 0407 08/19/19 0425  WBC 12.0* 12.2* 15.6*  RBC 4.51 4.14 3.34*  HGB 15.8* 14.5 11.9*  HCT 45.8 42.1 33.0*  MCV 101.6* 101.7* 98.8  MCH 35.0* 35.0* 35.6*  MCHC 34.5 34.4 36.1*  RDW 19.9* 19.9* 18.9*  PLT 106* 110* 107*  BNPNo results for input(s): BNP, PROBNP in the last 168 hours.   DDimer No results for input(s): DDIMER in the last 168 hours.   Radiology    [place] Liver Doppler  Result Date: 08/18/2019 CLINICAL DATA:  Jaundice. EXAM: DUPLEX ULTRASOUND OF LIVER TECHNIQUE: Color and duplex Doppler ultrasound was performed to evaluate the hepatic in-flow and out-flow vessels. COMPARISON:  CT scan of August 16, 2019. FINDINGS: Liver: Normal parenchymal echogenicity. Normal hepatic contour without nodularity. No focal lesion, mass or intrahepatic biliary ductal dilatation. Main Portal Vein size: 1.0 cm Portal Vein Velocities Main Prox:  17 cm/sec Main Mid: 13 cm/sec Main Dist:  12 cm/sec Right: 9 cm/sec Left: 10 cm/sec Normal hepatopetal flow is noted in the portal veins. Hepatic Vein Velocities Right:  28 cm/sec Middle:  29 cm/sec Left:  32 cm/sec Normal hepatofugal flow is noted in the hepatic veins. IVC: Present and patent with normal respiratory  phasicity. Hepatic Artery Velocity:  44 cm/sec Splenic Vein Velocity:  22 cm/sec Spleen: 9.1 cm x 0.6 cm x 8.3 cm with a total volume of 140 cm^3 (411 cm^3 is upper limit normal) Portal Vein Occlusion/Thrombus: No Splenic Vein Occlusion/Thrombus: No Ascites: None Varices: None IMPRESSION: No evidence of hepatic, portal or splenic venous thrombosis or occlusion is noted. Electronically Signed   By: Lupita RaiderJames  Green Jr M.D.   On: 08/18/2019 07:28   [place]as [place] Carotid  Result Date: 08/19/2019 Carotid Arterial Duplex Study Indications:       CVA. Risk Factors:      Current smoker. Limitations        Today's exam was limited due to the patient's inability or                    unwillingness to cooperate. Comparison Study:  no prior Performing Technologist: Jeb LeveringJill Parker RDMS, RVT  Examination Guidelines: A complete evaluation includes B-mode imaging, spectral Doppler, color Doppler, and power Doppler as needed of all accessible portions of each vessel. Bilateral testing is considered an integral part of a complete examination. Limited examinations for reoccurring indications may be performed as noted.  Right Carotid Findings: +----------+--------+--------+--------+------------------+--------+             PSV cm/s EDV cm/s Stenosis Plaque Description Comments  +----------+--------+--------+--------+------------------+--------+  CCA Prox   44       12                                             +----------+--------+--------+--------+------------------+--------+  CCA Distal 65       16                                             +----------+--------+--------+--------+------------------+--------+  ICA Prox   33       11       1-39%    heterogenous                 +----------+--------+--------+--------+------------------+--------+  ICA Distal 41       15                                             +----------+--------+--------+--------+------------------+--------+  ECA        55       8                                               +----------+--------+--------+--------+------------------+--------+ +----------+--------+-------+----------------+-------------------+             PSV cm/s EDV cms Describe         Arm Pressure (mmHG)  +----------+--------+-------+----------------+-------------------+  Subclavian 89               Multiphasic, WNL                      +----------+--------+-------+----------------+-------------------+ +---------+--------+--+--------+-+---------+  Vertebral PSV cm/s 33 EDV cm/s 7 Antegrade  +---------+--------+--+--------+-+---------+  Left Carotid Findings: +----------+--------+--------+--------+------------------+--------+             PSV cm/s EDV cm/s Stenosis Plaque Description Comments  +----------+--------+--------+--------+------------------+--------+  CCA Prox   77       20                                             +----------+--------+--------+--------+------------------+--------+  CCA Distal 46       15                                             +----------+--------+--------+--------+------------------+--------+  ICA Prox   47       20       1-39%    heterogenous                 +----------+--------+--------+--------+------------------+--------+  ICA Distal 61       25                                             +----------+--------+--------+--------+------------------+--------+  ECA        60       11                                             +----------+--------+--------+--------+------------------+--------+ +----------+--------+--------+----------------+-------------------+             PSV cm/s EDV cm/s Describe         Arm Pressure (mmHG)  +----------+--------+--------+----------------+-------------------+  Subclavian 73                Multiphasic, WNL                      +----------+--------+--------+----------------+-------------------+ +---------+--------+--+--------+-+---------+  Vertebral PSV cm/s 29 EDV cm/s 8 Antegrade  +---------+--------+--+--------+-+---------+  Summary: Right Carotid:  Velocities in the right ICA are consistent with a 1-39% stenosis. Left Carotid: Velocities in the left ICA are consistent with a 1-39% stenosis. Vertebrals:  Bilateral vertebral arteries demonstrate antegrade flow. Subclavians: Normal flow hemodynamics were seen in bilateral subclavian              arteries. *See table(s) above for measurements and observations.  Electronically signed by Delia HeadyPramod Sethi MD on 08/19/2019 at 8:08:58 AM.    Final    [place]as [place] Lower Extremity Arterial Duplex  Result Date: 08/18/2019 LOWER EXTREMITY ARTERIAL DUPLEX STUDY Other Factors: Leg weakness and discoloration, Afib.  Current ABI: No ABI due to absent pedal flow to obtain pressure Limitations: Poor image quality due to sunlight in patient's room Comparison Study: no prior Performing Technologist: Jeb LeveringJill Parker RDMS, RVT  Examination Guidelines: A complete evaluation includes B-mode imaging, spectral Doppler, color Doppler, and power Doppler as needed of all accessible portions of each vessel. Bilateral testing is considered an integral part of a complete examination. Limited examinations for reoccurring indications may be performed as noted.  +-----------+--------+-----+--------+----------+------------------+  RIGHT       PSV cm/s Ratio Stenosis Waveform   Comments            +-----------+--------+-----+--------+----------+------------------+  CFA Prox    57                      triphasic                      +-----------+--------+-----+--------+----------+------------------+  DFA         43                      triphasic                      +-----------+--------+-----+--------+----------+------------------+  SFA Prox    61                      triphasic                      +-----------+--------+-----+--------+----------+------------------+  SFA Mid     54                      triphasic                      +-----------+--------+-----+--------+----------+------------------+  SFA Distal  34                      triphasic                       +-----------+--------+-----+--------+----------+------------------+  POP Prox    25                      monophasic thrombus vs plaque  +-----------+--------+-----+--------+----------+------------------+  POP Mid                    occluded                                +-----------+--------+-----+--------+----------+------------------+  POP Distal                 occluded                                +-----------+--------+-----+--------+----------+------------------+  ATA Distal  7                       monophasic severely dampened   +-----------+--------+-----+--------+----------+------------------+  PTA Distal  occluded                                +-----------+--------+-----+--------+----------+------------------+  PERO Distal 12                      monophasic severely dampened   +-----------+--------+-----+--------+----------+------------------+  +-----------+--------+-----+--------+----------+------------------+  LEFT        PSV cm/s Ratio Stenosis Waveform   Comments            +-----------+--------+-----+--------+----------+------------------+  CFA Prox    64                      triphasic                      +-----------+--------+-----+--------+----------+------------------+  DFA         67                      triphasic                      +-----------+--------+-----+--------+----------+------------------+  SFA Prox    65                      triphasic                      +-----------+--------+-----+--------+----------+------------------+  SFA Mid     74                      triphasic                      +-----------+--------+-----+--------+----------+------------------+  SFA Distal  40                      triphasic                      +-----------+--------+-----+--------+----------+------------------+  POP Prox    21                      biphasic                       +-----------+--------+-----+--------+----------+------------------+  POP Mid                    occluded             partially occluded  +-----------+--------+-----+--------+----------+------------------+  POP Distal                 occluded            thrombus vs plaque  +-----------+--------+-----+--------+----------+------------------+  ATA Distal  6                       monophasic severely dampened   +-----------+--------+-----+--------+----------+------------------+  PTA Distal  11                      monophasic severely dampened   +-----------+--------+-----+--------+----------+------------------+  PERO Distal 8                       monophasic severely dampened   +-----------+--------+-----+--------+----------+------------------+  Arterial Summary: 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 Incidental finding Right popliteal vein DVT. Left: Total occlusion noted in the  popliteal artery.  See table(s) above for measurements and observations. Electronically signed by Monica Martinez MD on 08/18/2019 at 4:25:28 PM.    Final     Cardiac Studies   Procedure: 2D Echo, 3D Echo, Cardiac Doppler, Color Doppler and Strain Analysis  Indications: Stroke  History: Patient has no prior history of Echocardiogram examinations. Alcohol Abuse.  Sonographer: Raquel Sarna Senior RDCS Referring Phys: [id number] [place]   1. Left ventricular ejection fraction, by visual estimation, is approximately 15-20%. Mildly increased left ventricular size. There is borderline left ventricular hypertrophy. 2. Large, fixed thrombus on the apical septal wall of the left ventricle measuring 3.1 x 1.4 cm. 3. The average left ventricular global longitudinal strain is -4.0 %. 4. Left ventricular diastolic Doppler parameters are consistent with pseudonormalization pattern of LV diastolic filling. 5. Left atrial size was moderately dilated. 6. Mild to moderate mitral annular calcification. 7. The mitral valve is degenerative. Mild to moderate mitral valve  regurgitation. 8. The tricuspid valve is grossly normal. Tricuspid valve regurgitation is mild. 9. The pulmonic valve was grossly normal. Pulmonic valve regurgitation is trivial by color flow Doppler. 10. Mildly elevated pulmonary artery systolic pressure. 11. The aortic valve has an indeterminant number of cusps, possibly functionally bicuspid. Aortic valve regurgitation is trivial by color flow Doppler. Mild aortic valve sclerosis without stenosis. 12. Right atrial size was normal. 13. Multiple segmental abnormalities exist. See findings. 14. The inferior vena cava is dilated in size with >50% respiratory variability, suggesting right atrial pressure of 8 mmHg. 15. Global right ventricle has severely reduced systolic function.The right ventricular size is normal. No increase in right ventricular wall thickness.  Patient Profile     53 y.o. female  with past medical history of alcohol use and tobacco use with no prior cardiac historyand admitted 08/15/19 with SOB, jaundiced dx with PNA, CVA, and echo with EF 15-20%, large fixed thrombus on apical septal wall of LV measuring 3.1 X 1.4 cm and moderate MR and mild TR    Assessment & Plan   End stage biventricular CHF, unknown etiology possible sec to etoh abuse --thought to be non ischemic and related to ETOH --on Lasix 40 mg IV daily   I&O +6586  no new wts.   --spironolactone 12.5 and dig 0.125 daily.  Avoid BB -- Cr 1.41  -- plan for foley catheter today- monitor for urine output, may have to consider lasix gtts. -- If creatinine continues to rise, may consider short-term inotrope therapy - doubt she is a candidate for advanced HF therapies, but may consider advanced HF service evaluation -- I would not recommend LifeVest as this is likely non-ischemic and functional status is poor at this point.  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 Ultimately, would need to transition to oral anticoagulation - there is very little data on the use of  DOAC for LV thrombus - I would personally favor warfarin, which is considered standard of care and this may also help [place] to determine if she will be compliant with therapy (INR checks, etc.)  Still on IV heaprin.  INR 1.6 on no coumadin.   NSVT several episodes in addition to SVT.  Longest 33 beats  She had SVT for 12 sec.  Though in some leads appears VT.  No BB   PAD - noted to have poor distal pulses and cool, dusky extremities. LE arterial dopplers show occluded bilateral LE arterial flow at the level of the popliteal artery - may be thrombus.  Tentative plan for abdominal aortogram tomorrow.  Subacute Rt ASA stroke. Neuro following  Jaundice (followed by GI)  but most likely form alcoholic hepatitis.       For questions or updates, please contact [facility] Please consult [email or web address] for contact info under        Signed, Nada Boozer, NP  08/19/2019, 10:20 AM

## 2019-08-19 NOTE — Progress Notes (Signed)
-  Patient currently not in the room.  GI will follow tomorrow  Otis Brace MD, FACP 08/19/2019, 1:34 PM  Contact #  [phone]

## 2019-08-19 NOTE — Progress Notes (Addendum)
[place] for Heparin Indication: DVT, LV thrombus, and acute CVA  Allergies  Allergen Reactions  . Penicillins Other (See Comments)    Unsure of reaction Did it involve swelling of the face/tongue/throat, SOB, or low BP? Unknown Did it involve sudden or severe rash/hives, skin peeling, or any reaction on the inside of your mouth or nose? Unknown Did you need to seek medical attention at a hospital or doctor's office? Unknown When did it last happen?[place] If all above answers are "NO", may proceed with cephalosporin use.    Patient Measurements: Height: 5\' 5"  (165.1 cm) Weight: 180 lb (81.6 kg) IBW/kg (Calculated) : 57 Heparin Dosing Weight: 75 kg  Vital Signs: Temp: 98 F (36.7 C) (09/22 0809) Temp Source: Oral (09/22 0809) BP: 93/63 (09/22 0809) Pulse Rate: 82 (09/22 0809)  Labs: Recent Labs    08/16/19 1948  08/17/19 0439  08/18/19 0407 08/18/19 1255 08/18/19 2108 08/19/19 0425  HGB  --    < > 15.8*  --  14.5  --   --  11.9*  HCT  --   --  45.8  --  42.1  --   --  33.0*  PLT  --   --  106*  --  110*  --   --  107*  APTT  --   --  33  --   --   --   --   --   LABPROT  --   --  21.6*  --  19.2*  --   --  19.1*  INR  --   --  1.9*  --  1.6*  --   --  1.6*  HEPARINUNFRC  --   --   --    < > 0.16* 0.21* 0.31 0.51  CREATININE  --   --  1.30*  --  1.48*  --   --  1.41*  CKTOTAL 196  --   --   --   --   --   --   --    < > = values in this interval not displayed.    Estimated Creatinine Clearance: 48.1 mL/min (A) (by C-G formula based on SCr of 1.41 mg/dL (H)).   Assessment: [name] on heparin for LV thrombus, DVT, concern for PAD with plans for abdominal aortogram.  Heparin therapy complicated by acute CVA and need for lower goal.   Heparin level is slightly supra-therapeutic; no bleeding reported.  Noted thrombocytopenia - stable.  Goal of Therapy:  Heparin level 0.3-0.5 units/ml Monitor platelets by  anticoagulation protocol: Yes   Plan:  Reduce heparin gtt slightly to 1750 units/hr Daily heparin level and CBC  Dajane Valli D. Mina Marble, PharmD, BCPS, [place] 08/19/2019, 12:25 PM

## 2019-08-19 NOTE — Evaluation (Addendum)
Occupational Therapy Evaluation Patient Details Name: Briana Andrews MRN: [id number] DOB: 1966-09-17 Today's Date: 08/19/2019    History of Present Illness 53 year old female with history of smoking, alcohol use, she drinks more than 6 packs of beer every day since last 20 years, no chronic medical issues brought to the emergency room with confusion more than usual and falls.  Since last few months there have been noticing some changes on her including weakness on her legs and poor appetite as much as she stopped smoking and decreased her drinking.  2 weeks ago, she was taken to [facility] with a fall, skeletal survey was negative except L1 fracture with 25% height loss and discharged home on muscle relaxants.  Patient has been intermittently confused since then. In the emergency room, she was found with subacute right ACA stroke, jaundice with bilirubin of 12, bilateral multifocal pneumonia and metabolic encephalopathy.   Clinical Impression   Pt admitted with above diagnoses, cognitive deficits, RLE pain, and generalized weakness limiting ability to engage in BADL at desired level of ind. PTA pt independent per report. She presents with decreased arousal and cognition. Several alerting activities used to increase arousal and pt still not consistently attending. Pt states she is 53 years old and it is Oct 1920. She is overall a min A +2 for safety with mobility at this time. Pt completed bed <> BSC <> chair t/f. Nausea without emesis reported with mobility. At this time given current level, recommend CIR for d/c. Will continue to follow per POC listed below.     Follow Up Recommendations  CIR;Supervision/Assistance - 24 hour    Equipment Recommendations  Other (comment)(defer to next venue)    Recommendations for Other Services       Precautions / Restrictions Precautions Precautions: Fall Restrictions Weight Bearing Restrictions: No      Mobility Bed Mobility Overal  bed mobility: Needs Assistance Bed Mobility: Supine to Sit     Supine to sit: Min assist;+2 for safety/equipment     General bed mobility comments: step by step sequencing cues needed, assist to pull up at trunk into sitting and to square hips to EOB  Transfers Overall transfer level: Needs assistance Equipment used: Rolling walker (2 wheeled) Transfers: Sit to/from [employer] Sit to Stand: Min assist;+2 safety/equipment Stand pivot transfers: Min assist;+2 safety/equipment       General transfer comment: assist to power up and steady <> BSC and <> chair    Balance Overall balance assessment: Needs assistance Sitting-balance support: Feet supported;Bilateral upper extremity supported Sitting balance-Leahy Scale: Fair Sitting balance - Comments: min guard assist EOB   Standing balance support: Bilateral upper extremity supported;During functional activity Standing balance-Leahy Scale: Poor Standing balance comment: reliant on external support                           ADL either performed or assessed with clinical judgement   ADL Overall ADL's : Needs assistance/impaired Eating/Feeding: Set up;Sitting   Grooming: Set up;Sitting   Upper Body Bathing: Minimal assistance;Sitting   Lower Body Bathing: Moderate assistance;Sit to/from stand;Sitting/lateral leans   Upper Body Dressing : Minimal assistance;Sitting   Lower Body Dressing: Moderate assistance;Sit to/from stand;Sitting/lateral leans   Toilet Transfer: Minimal assistance;+2 for safety/equipment;BSC;Stand-pivot;RW   Toileting- Clothing Manipulation and Hygiene: Minimal assistance;Sit to/from stand;Sitting/lateral lean   Tub/ Shower Transfer: Minimal assistance;+2 for safety/equipment;Shower seat;Ambulation;Rolling walker   Functional mobility during ADLs: Minimal assistance;+2 for safety/equipment;Rolling walker General ADL  Comments: pt needing cues to maintain attention/arousal  through all BADL; ltd by cognitive deficits, pain and generalized weakness     Vision Patient Visual Report: No change from baseline Additional Comments: unable to fully assess 2/2 cognition, but functional for tasks of eval     Perception     Praxis      Pertinent Vitals/Pain Pain Assessment: Faces Faces Pain Scale: Hurts even more Pain Location: R foot Pain Descriptors / Indicators: Aching;Sore Pain Intervention(s): Limited activity within patient's tolerance;Monitored during session;Repositioned     Hand Dominance     Extremity/Trunk Assessment Upper Extremity Assessment Upper Extremity Assessment: Generalized weakness;Difficult to assess due to impaired cognition           Communication     Cognition Arousal/Alertness: Awake/alert Behavior During Therapy: Flat affect Overall Cognitive Status: Impaired/Different from baseline Area of Impairment: Attention;Memory;Safety/judgement;Problem solving;Awareness;Orientation                 Orientation Level: Person;Place;Time;Situation(states she is 20, states it's october 1920) Current Attention Level: Sustained Memory: Decreased short-term memory   Safety/Judgement: Decreased awareness of deficits Awareness: Intellectual Problem Solving: Slow processing;Decreased initiation;Difficulty sequencing;Requires verbal cues General Comments: decreased arousal this date, cues to stay alert/stay on task   General Comments       Exercises     Shoulder Instructions      Home Living Family/patient expects to be discharged to:: Private residence Living Arrangements: Spouse/significant other;Children Available Help at Discharge: Family;Available 24 hours/day Type of Home: House Home Access: Ramped entrance     Home Layout: One level     Bathroom Shower/Tub: Occupational psychologist: Standard     Home Equipment: Environmental consultant - 2 wheels   Additional Comments: report taken from PT information, pt with  fluctuating arousal this date      Prior Functioning/Environment Level of Independence: Independent                 OT Problem List: Decreased strength;Decreased knowledge of use of DME or AE;Decreased knowledge of precautions;Decreased cognition;Decreased activity tolerance;Cardiopulmonary status limiting activity;Impaired balance (sitting and/or standing);Decreased safety awareness;Pain      OT Treatment/Interventions: Self-care/ADL training;Therapeutic exercise;Patient/family education;Neuromuscular education;Balance training;Energy conservation;Therapeutic activities;DME and/or AE instruction;Cognitive remediation/compensation    OT Goals(Current goals can be found in the care plan section) Acute Rehab OT Goals Patient Stated Goal: go to the BR OT Goal Formulation: With patient Time For Goal Achievement: 09/02/19 Potential to Achieve Goals: Good  OT Frequency: Min 2X/week   Barriers to D/C:            Co-evaluation              AM-PAC OT "6 Clicks" Daily Activity     Outcome Measure Help from another person eating meals?: A Little Help from another person taking care of personal grooming?: A Little Help from another person toileting, which includes using toliet, bedpan, or urinal?: A Little Help from another person bathing (including washing, rinsing, drying)?: A Lot Help from another person to put on and taking off regular upper body clothing?: A Little Help from another person to put on and taking off regular lower body clothing?: A Lot 6 Click Score: 16   End of Session Equipment Utilized During Treatment: Gait belt;Rolling walker;Oxygen Nurse Communication: Mobility status  Activity Tolerance: Patient tolerated treatment well Patient left: in chair;with call bell/phone within reach;with chair alarm set  OT Visit Diagnosis: Other abnormalities of gait and mobility (R26.89)  Time: 3664-4034 OT Time Calculation (min): 50 min Charges:  OT  General Charges $OT Visit: 1 Visit OT Evaluation $OT Eval Moderate Complexity: 1 Mod OT Treatments $Self Care/Home Management : 8-22 mins  Dalphine Handing, MSOT, OTR/L Behavioral Health OT/ Acute Relief OT [facility] Office: [phone]   Dalphine Handing 08/19/2019, 1:05 PM

## 2019-08-19 NOTE — Progress Notes (Addendum)
ANTICOAGULATION CONSULT NOTE - Follow Up Consult  Pharmacy Consult for Heparin Indication: DVT, LV thrombus, and acute CVA  Allergies  Allergen Reactions  . Penicillins Other (See Comments)    Unsure of reaction Did it involve swelling of the face/tongue/throat, SOB, or low BP? Unknown Did it involve sudden or severe rash/hives, skin peeling, or any reaction on the inside of your mouth or nose? Unknown Did you need to seek medical attention at a hospital or doctor's office? Unknown When did it last happen?[place] If all above answers are "NO", may proceed with cephalosporin use.    Patient Measurements: Height: 5\' 5"  (165.1 cm) Weight: 180 lb (81.6 kg) IBW/kg (Calculated) : 57 Heparin Dosing Weight: 75 kg  Vital Signs: Temp: 98 F (36.7 C) (09/22 0809) Temp Source: Oral (09/22 0809) BP: 108/63 (09/22 1425) Pulse Rate: 90 (09/22 1425)  Labs: Recent Labs    08/16/19 1948  08/17/19 0439  08/18/19 0407 08/18/19 1255 08/18/19 2108 08/19/19 0425  HGB  --    < > 15.8*  --  14.5  --   --  11.9*  HCT  --   --  45.8  --  42.1  --   --  33.0*  PLT  --   --  106*  --  110*  --   --  107*  APTT  --   --  33  --   --   --   --   --   LABPROT  --   --  21.6*  --  19.2*  --   --  19.1*  INR  --   --  1.9*  --  1.6*  --   --  1.6*  HEPARINUNFRC  --   --   --    < > 0.16* 0.21* 0.31 0.51  CREATININE  --   --  1.30*  --  1.48*  --   --  1.41*  CKTOTAL 196  --   --   --   --   --   --   --    < > = values in this interval not displayed.    Estimated Creatinine Clearance: 48.1 mL/min (A) (by C-G formula based on SCr of 1.41 mg/dL (H)).   Medications:  Scheduled:  . digoxin  0.125 mg Oral Daily  . folic acid  1 mg Oral Daily  . furosemide  40 mg Intravenous Daily  . lactulose  10 g Oral BID  . levofloxacin  750 mg Oral Q48H  . multivitamin with minerals  1 tablet Oral Daily  . nicotine  21 mg Transdermal Daily  . [START ON 08/20/2019] potassium chloride  20 mEq Oral BID   . potassium chloride  40 mEq Oral Q6H  . rifaximin  550 mg Oral BID  . spironolactone  12.5 mg Oral Daily   Infusions:  . sodium chloride 100 mL/hr at 08/19/19 0633  . heparin 1,800 Units/hr (08/18/19 2257)  . thiamine injection 500 mg (08/18/19 1131)    Assessment: [name] on heparin for LV thrombus and bilateral lower extremity ischemia w/ right popliteal vein DVT now s/p angioplasty and thrombectomy of the tibial artery. Heparin therapy complicated by acute CVA and need for lower goal. Plans noted for coumadin when no further procedures  Pharmacy to restart heparin post cath today.    Goal of Therapy:  Heparin level 0.3-0.5 units/ml Monitor platelets by anticoagulation protocol: Yes   Plan:  -restart heparin at 1750 units/hr -Heparin  level in 6 hours and daily wth CBC daily -Will follow procedural plans  Harland German, PharmD Clinical Pharmacist **Pharmacist phone directory can now be found on [email or web address] ([email or web address]).  Listed under [facility].

## 2019-08-20 ENCOUNTER — Inpatient Hospital Stay (HOSPITAL_COMMUNITY)
Admission: EM | Disposition: A | Discharge status: Skilled Nursing Facility | Payer: Self-pay | Source: Home / Self Care | Attending: Internal Medicine

## 2019-08-20 ENCOUNTER — Inpatient Hospital Stay (HOSPITAL_COMMUNITY): Payer: 59

## 2019-08-20 ENCOUNTER — Inpatient Hospital Stay: Payer: Self-pay

## 2019-08-20 ENCOUNTER — Encounter (HOSPITAL_COMMUNITY): Payer: Self-pay | Admitting: Surgery

## 2019-08-20 DIAGNOSIS — R579 Shock, unspecified: Secondary | ICD-10-CM | POA: Insufficient documentation

## 2019-08-20 DIAGNOSIS — Z7189 Other specified counseling: Secondary | ICD-10-CM

## 2019-08-20 DIAGNOSIS — Z515 Encounter for palliative care: Secondary | ICD-10-CM

## 2019-08-20 DIAGNOSIS — R570 Cardiogenic shock: Secondary | ICD-10-CM

## 2019-08-20 HISTORY — PX: ABDOMINAL AORTOGRAM W/LOWER EXTREMITY: CATH118223

## 2019-08-20 HISTORY — PX: PERIPHERAL VASCULAR INTERVENTION: CATH118257

## 2019-08-20 LAB — CARDIOLIPIN ANTIBODIES, IGG, IGM, IGA
Anticardiolipin IgA: <9 APL U/mL (ref 0–11)
Anticardiolipin IgG: <9 GPL U/mL (ref 0–14)
Anticardiolipin IgM: <9 MPL U/mL (ref 0–12)

## 2019-08-20 LAB — CBC
HCT: 27.7 % — ABNORMAL LOW (ref 36.0–46.0)
HCT: 17.6 % — ABNORMAL LOW (ref 36.0–46.0)
Hemoglobin: 9.2 g/dL — ABNORMAL LOW (ref 12.0–15.0)
Hemoglobin: 5.9 g/dL — CL (ref 12.0–15.0)
MCH: 34.5 pg — ABNORMAL HIGH (ref 26.0–34.0)
MCH: 35.5 pg — ABNORMAL HIGH (ref 26.0–34.0)
MCHC: 33.2 g/dL (ref 30.0–36.0)
MCHC: 33.5 g/dL (ref 30.0–36.0)
MCV: 103.7 fL — ABNORMAL HIGH (ref 80.0–100.0)
MCV: 106 fL — ABNORMAL HIGH (ref 80.0–100.0)
Platelets: 132 10*3/uL — ABNORMAL LOW (ref 150–400)
Platelets: 120 10*3/uL — ABNORMAL LOW (ref 150–400)
RBC: 2.67 MIL/uL — ABNORMAL LOW (ref 3.87–5.11)
RBC: 1.66 MIL/uL — ABNORMAL LOW (ref 3.87–5.11)
RDW: 20 % — ABNORMAL HIGH (ref 11.5–15.5)
RDW: 21 % — ABNORMAL HIGH (ref 11.5–15.5)
WBC: 20.1 10*3/uL — ABNORMAL HIGH (ref 4.0–10.5)
WBC: 15.8 10*3/uL — ABNORMAL HIGH (ref 4.0–10.5)
nRBC: 0.9 % — ABNORMAL HIGH (ref 0.0–0.2)
nRBC: 0.8 % — ABNORMAL HIGH (ref 0.0–0.2)

## 2019-08-20 LAB — COMPREHENSIVE METABOLIC PANEL
ALT: 36 U/L (ref 0–44)
AST: 44 U/L — ABNORMAL HIGH (ref 15–41)
Albumin: 1.2 g/dL — ABNORMAL LOW (ref 3.5–5.0)
Alkaline Phosphatase: 70 U/L (ref 38–126)
Anion gap: 6 (ref 5–15)
BUN: 67 mg/dL — ABNORMAL HIGH (ref 6–20)
CO2: 25 mmol/L (ref 22–32)
Calcium: 7.5 mg/dL — ABNORMAL LOW (ref 8.9–10.3)
Chloride: 105 mmol/L (ref 98–111)
Creatinine, Ser: 1.51 mg/dL — ABNORMAL HIGH (ref 0.44–1.00)
GFR calc Af Amer: 45 mL/min — ABNORMAL LOW (ref >60)
GFR calc non Af Amer: 39 mL/min — ABNORMAL LOW (ref >60)
Glucose, Bld: 92 mg/dL (ref 70–99)
Potassium: 3.7 mmol/L (ref 3.5–5.1)
Sodium: 136 mmol/L (ref 135–145)
Total Bilirubin: 7.7 mg/dL — ABNORMAL HIGH (ref 0.3–1.2)
Total Protein: 4.7 g/dL — ABNORMAL LOW (ref 6.5–8.1)

## 2019-08-20 LAB — COOXEMETRY PANEL
Carboxyhemoglobin: 1.7 % — ABNORMAL HIGH (ref 0.5–1.5)
Methemoglobin: 0.7 % (ref 0.0–1.5)
O2 Saturation: 47.7 %
Total hemoglobin: 6.1 g/dL — CL (ref 12.0–16.0)

## 2019-08-20 LAB — PROTIME-INR
INR: 1.7 — ABNORMAL HIGH (ref 0.8–1.2)
Prothrombin Time: 20 seconds — ABNORMAL HIGH (ref 11.4–15.2)

## 2019-08-20 LAB — OCCULT BLOOD X 1 CARD TO LAB, STOOL: Fecal Occult Bld: POSITIVE — AB

## 2019-08-20 LAB — MAGNESIUM: Magnesium: 1.6 mg/dL — ABNORMAL LOW (ref 1.7–2.4)

## 2019-08-20 LAB — ABO/RH: ABO/RH(D): A POS

## 2019-08-20 LAB — PHOSPHORUS: Phosphorus: 3.5 mg/dL (ref 2.5–4.6)

## 2019-08-20 LAB — BETA-2-GLYCOPROTEIN I ABS, IGG/M/A
Beta-2 Glyco I IgG: <9 GPI IgG units (ref 0–20)
Beta-2-Glycoprotein I IgA: <9 GPI IgA units (ref 0–25)
Beta-2-Glycoprotein I IgM: <9 GPI IgM units (ref 0–32)

## 2019-08-20 LAB — LUPUS ANTICOAGULANT PANEL
DRVVT: 66 s — ABNORMAL HIGH (ref 0.0–47.0)
PTT Lupus Anticoagulant: 31.7 s (ref 0.0–51.9)

## 2019-08-20 LAB — PREPARE RBC (CROSSMATCH)

## 2019-08-20 LAB — DRVVT MIX: dRVVT Mix: 45.8 s (ref 0.0–47.0)

## 2019-08-20 LAB — HEPARIN LEVEL (UNFRACTIONATED): Heparin Unfractionated: 0.44 IU/mL (ref 0.30–0.70)

## 2019-08-20 SURGERY — ABDOMINAL AORTOGRAM W/LOWER EXTREMITY
Anesthesia: LOCAL | Laterality: Left

## 2019-08-20 MED ORDER — MAGNESIUM SULFATE 2 GM/50ML IV SOLN
2.0000 g | Freq: Once | INTRAVENOUS | Status: AC
  Administered 2019-08-20: 07:00:00 2 g via INTRAVENOUS
  Filled 2019-08-20: qty 50

## 2019-08-20 MED ORDER — FENTANYL CITRATE (PF) 100 MCG/2ML IJ SOLN
INTRAMUSCULAR | Status: AC
  Filled 2019-08-20: qty 2

## 2019-08-20 MED ORDER — SODIUM CHLORIDE 0.9 % IV SOLN: Freq: Once | INTRAVENOUS | Status: DC

## 2019-08-20 MED ORDER — SODIUM CHLORIDE 0.9 % IV SOLN
8.0000 mg/h | INTRAVENOUS | Status: DC
  Administered 2019-08-20 – 2019-08-23 (×6): 8 mg/h via INTRAVENOUS
  Filled 2019-08-20 (×9): qty 80

## 2019-08-20 MED ORDER — ONDANSETRON HCL 4 MG/2ML IJ SOLN: 4.0000 mg | Freq: Four times a day (QID) | INTRAMUSCULAR | Status: DC | PRN

## 2019-08-20 MED ORDER — FENTANYL CITRATE (PF) 100 MCG/2ML IJ SOLN
INTRAMUSCULAR | Status: DC | PRN
  Administered 2019-08-20: 25 ug via INTRAVENOUS

## 2019-08-20 MED ORDER — SODIUM CHLORIDE 0.9 % IV BOLUS
500.0000 mL | Freq: Once | INTRAVENOUS | Status: AC
  Administered 2019-08-20: 500 mL via INTRAVENOUS

## 2019-08-20 MED ORDER — MIDAZOLAM HCL 2 MG/2ML IJ SOLN
INTRAMUSCULAR | Status: DC | PRN
  Administered 2019-08-20: 1 mg via INTRAVENOUS

## 2019-08-20 MED ORDER — LIDOCAINE HCL (PF) 1 % IJ SOLN
INTRAMUSCULAR | Status: AC
  Filled 2019-08-20: qty 30

## 2019-08-20 MED ORDER — SODIUM CHLORIDE 0.9% FLUSH
3.0000 mL | Freq: Two times a day (BID) | INTRAVENOUS | Status: DC
  Administered 2019-08-20: 18:00:00 3 mL via INTRAVENOUS

## 2019-08-20 MED ORDER — HEPARIN SODIUM (PORCINE) 1000 UNIT/ML IJ SOLN
INTRAMUSCULAR | Status: AC
  Filled 2019-08-20: qty 1

## 2019-08-20 MED ORDER — SODIUM CHLORIDE 0.9 % IV SOLN
80.0000 mg | Freq: Once | INTRAVENOUS | Status: AC
  Administered 2019-08-20: 80 mg via INTRAVENOUS
  Filled 2019-08-20: qty 80

## 2019-08-20 MED ORDER — SODIUM CHLORIDE 0.9 % IV SOLN
2.0000 g | INTRAVENOUS | Status: DC
  Administered 2019-08-20 – 2019-08-22 (×3): 2 g via INTRAVENOUS
  Filled 2019-08-20 (×2): qty 20
  Filled 2019-08-20: qty 2
  Filled 2019-08-20: qty 20

## 2019-08-20 MED ORDER — OCTREOTIDE LOAD VIA INFUSION
50.0000 ug | Freq: Once | INTRAVENOUS | Status: AC
  Administered 2019-08-20: 50 ug via INTRAVENOUS
  Filled 2019-08-20: qty 25

## 2019-08-20 MED ORDER — HEPARIN SODIUM (PORCINE) 1000 UNIT/ML IJ SOLN
INTRAMUSCULAR | Status: DC | PRN
  Administered 2019-08-20: 8000 [IU] via INTRAVENOUS

## 2019-08-20 MED ORDER — NITROGLYCERIN IN D5W 200-5 MCG/ML-% IV SOLN
INTRAVENOUS | Status: AC
  Filled 2019-08-20: qty 250

## 2019-08-20 MED ORDER — SODIUM CHLORIDE 0.9 % IV BOLUS
250.0000 mL | Freq: Once | INTRAVENOUS | Status: AC
  Administered 2019-08-20: 02:00:00 250 mL via INTRAVENOUS

## 2019-08-20 MED ORDER — SODIUM CHLORIDE 0.9 % IV BOLUS
500.0000 mL | Freq: Once | INTRAVENOUS | Status: AC
  Administered 2019-08-20: 06:00:00 500 mL via INTRAVENOUS

## 2019-08-20 MED ORDER — HEPARIN (PORCINE) 25000 UT/250ML-% IV SOLN: 1750.0000 [IU]/h | INTRAVENOUS | Status: DC

## 2019-08-20 MED ORDER — SODIUM CHLORIDE 0.9% IV SOLUTION: Freq: Once | INTRAVENOUS | Status: DC

## 2019-08-20 MED ORDER — SODIUM CHLORIDE 0.9% FLUSH: 3.0000 mL | INTRAVENOUS | Status: DC | PRN

## 2019-08-20 MED ORDER — MAGNESIUM SULFATE 2 GM/50ML IV SOLN
2.0000 g | Freq: Once | INTRAVENOUS | Status: AC
  Administered 2019-08-20: 2 g via INTRAVENOUS
  Filled 2019-08-20: qty 50

## 2019-08-20 MED ORDER — HEPARIN (PORCINE) IN NACL 1000-0.9 UT/500ML-% IV SOLN
INTRAVENOUS | Status: AC
  Filled 2019-08-20: qty 1000

## 2019-08-20 MED ORDER — SODIUM CHLORIDE 0.9 % IV SOLN: INTRAVENOUS | Status: AC

## 2019-08-20 MED ORDER — SODIUM CHLORIDE 0.9 % IV SOLN
50.0000 ug/h | INTRAVENOUS | Status: AC
  Administered 2019-08-20 – 2019-08-23 (×7): 50 ug/h via INTRAVENOUS
  Filled 2019-08-20 (×11): qty 1

## 2019-08-20 MED ORDER — SODIUM CHLORIDE 0.9% FLUSH
10.0000 mL | INTRAVENOUS | Status: DC | PRN
  Administered 2019-08-28 – 2019-09-06 (×4): 10 mL
  Filled 2019-08-20 (×4): qty 40

## 2019-08-20 MED ORDER — LIDOCAINE HCL (PF) 1 % IJ SOLN
INTRAMUSCULAR | Status: DC | PRN
  Administered 2019-08-20: 20 mL via INTRADERMAL

## 2019-08-20 MED ORDER — MIDAZOLAM HCL 2 MG/2ML IJ SOLN
INTRAMUSCULAR | Status: AC
  Filled 2019-08-20: qty 2

## 2019-08-20 MED ORDER — PANTOPRAZOLE SODIUM 40 MG IV SOLR
40.0000 mg | Freq: Two times a day (BID) | INTRAVENOUS | Status: DC
  Administered 2019-08-24 – 2019-08-26 (×6): 40 mg via INTRAVENOUS
  Filled 2019-08-20 (×6): qty 40

## 2019-08-20 MED ORDER — NITROGLYCERIN 1 MG/10 ML FOR IR/CATH LAB
INTRA_ARTERIAL | Status: DC | PRN
  Administered 2019-08-20 (×4): 300 ug via INTRA_ARTERIAL

## 2019-08-20 MED ORDER — LABETALOL HCL 5 MG/ML IV SOLN: 10.0000 mg | INTRAVENOUS | Status: DC | PRN

## 2019-08-20 MED ORDER — DOBUTAMINE IN D5W 4-5 MG/ML-% IV SOLN
2.5000 ug/kg/min | INTRAVENOUS | Status: DC
  Administered 2019-08-20 – 2019-08-22 (×2): 2.5 ug/kg/min via INTRAVENOUS
  Filled 2019-08-20 (×2): qty 250

## 2019-08-20 MED ORDER — ACETAMINOPHEN 325 MG PO TABS
650.0000 mg | ORAL_TABLET | ORAL | Status: DC | PRN
  Administered 2019-08-24: 650 mg via ORAL
  Filled 2019-08-20: qty 2

## 2019-08-20 MED ORDER — HYDRALAZINE HCL 20 MG/ML IJ SOLN: 5.0000 mg | INTRAMUSCULAR | Status: DC | PRN

## 2019-08-20 MED ORDER — SODIUM CHLORIDE 0.9 % IV SOLN
250.0000 mL | INTRAVENOUS | Status: DC | PRN
  Administered 2019-08-20: 250 mL via INTRAVENOUS
  Administered 2019-08-31: 1000 mL via INTRAVENOUS

## 2019-08-20 MED ORDER — IODIXANOL 320 MG/ML IV SOLN
INTRAVENOUS | Status: DC | PRN
  Administered 2019-08-20: 45 mL via INTRA_ARTERIAL

## 2019-08-20 SURGICAL SUPPLY — 18 items
CANISTER PENUMBRA ENGINE (MISCELLANEOUS) ×3 IMPLANT
CATH CROSS OVER TEMPO 5F (CATHETERS) ×3 IMPLANT
CATH INDIGO CAT6 KIT (CATHETERS) ×3 IMPLANT
CATH QUICKCROSS .018X135CM (MICROCATHETER) ×3 IMPLANT
CLOSURE MYNX CONTROL 6F/7F (Vascular Products) ×3 IMPLANT
DEVICE TORQUE .025-.038 (MISCELLANEOUS) ×3 IMPLANT
GUIDEWIRE ANGLED .035X260CM (WIRE) ×3 IMPLANT
KIT MICROPUNCTURE NIT STIFF (SHEATH) ×3 IMPLANT
KIT PV (KITS) ×3 IMPLANT
SHEATH DESTINATION 7FR 90 (SHEATH) ×3 IMPLANT
SHEATH PINNACLE 5F 10CM (SHEATH) ×3 IMPLANT
SHEATH PINNACLE 7F 10CM (SHEATH) ×3 IMPLANT
SHEATH PROBE COVER 6X72 (BAG) ×3 IMPLANT
TRANSDUCER W/STOPCOCK (MISCELLANEOUS) ×3 IMPLANT
TRAY PV CATH (CUSTOM PROCEDURE TRAY) ×3 IMPLANT
WIRE BENTSON .035X145CM (WIRE) ×3 IMPLANT
WIRE G V18X300CM (WIRE) ×3 IMPLANT
WIRE ROSEN-J .035X260CM (WIRE) ×3 IMPLANT

## 2019-08-20 NOTE — Progress Notes (Signed)
PT Cancellation Note  Patient Details Name: Bayan Hedstrom MRN: [id number] DOB: Jun 18, 1966   Cancelled Treatment:    Reason Eval/Treat Not Completed: (P) Patient at procedure or test/unavailable Pt in cath lab for procedure. PT will follow back later today as able.  Coree Brame B. Migdalia Dk PT, DPT Acute Rehabilitation Services Pager [phone] Office [phone]    [place] 08/20/2019, 12:00 PM

## 2019-08-20 NOTE — Progress Notes (Addendum)
Progress Note  Patient Name: Briana Andrews Date of Encounter: 08/20/2019  Primary Cardiologist: Ena Dawley, MD   Subjective   Pt moaning, is cold, is hurting. Not SOB  Inpatient Medications    Scheduled Meds: . Chlorhexidine Gluconate Cloth  6 each Topical Daily  . digoxin  0.125 mg Oral Daily  . folic acid  1 mg Oral Daily  . furosemide  40 mg Intravenous Daily  . lactulose  10 g Oral BID  . levofloxacin  750 mg Oral Q48H  . multivitamin with minerals  1 tablet Oral Daily  . nicotine  21 mg Transdermal Daily  . potassium chloride  20 mEq Oral BID  . rifaximin  550 mg Oral BID  . sodium chloride flush  3 mL Intravenous Q12H  . spironolactone  12.5 mg Oral Daily   Continuous Infusions: . sodium chloride 100 mL/hr at 08/19/19 0938  . sodium chloride    . sodium chloride    . heparin 1,750 Units/hr (08/20/19 1829)  . thiamine injection 500 mg (08/19/19 1741)   PRN Meds: sodium chloride, acetaminophen, albuterol, dextromethorphan-guaiFENesin, hydrALAZINE, labetalol, methocarbamol, ondansetron (ZOFRAN) IV, ondansetron **OR** [DISCONTINUED] ondansetron (ZOFRAN) IV, oxyCODONE, sodium chloride flush, sodium chloride flush   Vital Signs    Vitals:   08/20/19 0105 08/20/19 0405 08/20/19 0435 08/20/19 0519  BP: (!) 82/62 (!) 81/47 (!) 109/42 (!) 81/51  Pulse: 83 79 87 84  Resp:    18  Temp:    98 F (36.7 C)  TempSrc:    Oral  SpO2: 100% 100% 100% 97%  Weight:    81.2 kg  Height:        Intake/Output Summary (Last 24 hours) at 08/20/2019 0848 Last data filed at 08/20/2019 9371 Gross per 24 hour  Intake 1652.73 ml  Output 1600 ml  Net 52.73 ml   Last 3 Weights 08/20/2019 08/15/2019 08/04/2019  Weight (lbs) 179 lb 0.2 oz 180 lb 180 lb  Weight (kg) 81.2 kg 81.647 kg 81.647 kg      Telemetry    SR, PACs, episodes of VT - Personally Reviewed  ECG    No new - Personally Reviewed  Physical Exam   General: Well developed, well nourished, female in no acute  distress Head: Eyes PERRLA Normocephalic and atraumatic Lungs: Clear bilaterally to auscultation. Heart: HRRR S1 S2, without rub or gallop. No murmur. Upper extremity Pulses are 2+ & equal. No JVD. Abdomen: Bowel sounds are present, abdomen soft and non-tender without masses or  hernias noted. Msk: weak strength and tone for age. Extremities: No clubbing, cyanosis or edema.    Skin:  No rashes or lesions noted. Neuro: Alert and oriented X 3. Psych:  Good affect, responds appropriately  Labs    High Sensitivity Troponin:  No results for input(s): TROPONINIHS in the last 720 hours.    Chemistry Recent Labs  Lab 08/18/19 0407 08/19/19 0425 08/20/19 0141  NA 132* 134* 136  K 4.3 2.7* 3.7  CL 100 100 105  CO2 19* 23 25  GLUCOSE 83 98 92  BUN 39* 47* 67*  CREATININE 1.48* 1.41* 1.51*  CALCIUM 7.9* 7.6* 7.5*  PROT 5.9* 4.7* 4.7*  ALBUMIN 1.7* 1.3* 1.2*  AST 69* 48* 44*  ALT 38 35 36  ALKPHOS 75 66 70  BILITOT 11.8* 9.3* 7.7*  GFRNONAA 40* 42* 39*  GFRAA 46* 49* 45*  ANIONGAP 13 11 6      Hematology Recent Labs  Lab 08/18/19 0407 08/19/19 0425 08/20/19 0141  WBC 12.2* 15.6* 20.1*  RBC 4.14 3.34* 2.67*  HGB 14.5 11.9* 9.2*  HCT 42.1 33.0* 27.7*  MCV 101.7* 98.8 103.7*  MCH 35.0* 35.6* 34.5*  MCHC 34.4 36.1* 33.2  RDW 19.9* 18.9* 20.0*  PLT 110* 107* 132*   Magnesium  Date Value Ref Range Status  08/20/2019 1.6 (L) 1.7 - 2.4 mg/dL Final    Comment:    Performed at [facility], [address], [place], [place] [address]   Lab Results  Component Value Date   INR 1.7 (H) 08/20/2019   INR 1.6 (H) 08/19/2019   INR 1.6 (H) 08/18/2019      Radiology    No results found.  Cardiac Studies   Procedure: 2D Echo, 3D Echo, Cardiac Doppler, Color Doppler and Strain Analysis  Indications: Stroke  History: Patient has no prior history of Echocardiogram examinations. Alcohol Abuse.  Sonographer: Irving Burton Senior RDCS  Referring Phys: [id number] Dorcas Carrow  IMPRESSIONS 1. Left ventricular ejection fraction, by visual estimation, is approximately 15-20%. Mildly increased left ventricular size. There is borderline left ventricular hypertrophy. 2. Large, fixed thrombus on the apical septal wall of the left ventricle measuring 3.1 x 1.4 cm. 3. The average left ventricular global longitudinal strain is -4.0 %. 4. Left ventricular diastolic Doppler parameters are consistent with pseudonormalization pattern of LV diastolic filling. 5. Left atrial size was moderately dilated. 6. Mild to moderate mitral annular calcification. 7. The mitral valve is degenerative. Mild to moderate mitral valve regurgitation. 8. The tricuspid valve is grossly normal. Tricuspid valve regurgitation is mild. 9. The pulmonic valve was grossly normal. Pulmonic valve regurgitation is trivial by color flow Doppler. 10. Mildly elevated pulmonary artery systolic pressure. 11. The aortic valve has an indeterminant number of cusps, possibly functionally bicuspid. Aortic valve regurgitation is trivial by color flow Doppler. Mild aortic valve sclerosis without stenosis. 12. Right atrial size was normal. 13. Multiple segmental abnormalities exist. See findings. 14. The inferior vena cava is dilated in size with >50% respiratory variability, suggesting right atrial pressure of 8 mmHg. 15. Global right ventricle has severely reduced systolic function.The right ventricular size is normal. No increase in right ventricular wall thickness.  Patient Profile     53 y.o. 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 1.64>>1.30>>1.48>>1.41>>1.51, follow - with albumin as low as it is, volume will be hard to manage - low-flow, low-output CHF, may need CHF team to see - not Life-Vest candidate at this point due to co-morbidities and poor functional status  LV thrombus -  - on heparin  - since little data on DOAC for LV thrombus>>coumadin - Liver enzymes are not that bad, so can use.  - has not yet been started, INR 1.6 due to GI issues  NSVT  - several episodes overnight, longest >20 bts - K+ 2.7>>3.7 after 4 runs KCl, need  to keep >4, po rx ordered  - Mg 1.6>> 2 Gm IV ordered - follow   PAD -  - VVS following, s/p R pop PTA, for L today  Subacute Rt ASA stroke.  - per IM/Neuro   Jaundice  - per IM/GI - probably ETOH hepatitis   - albumin 1.2  - on lactulose     For questions or updates, please contact [facility] Please consult [email or web address] for contact info under        Signed, Theodore Demark, PA-C  08/20/2019, 8:48 AM

## 2019-08-20 NOTE — Progress Notes (Signed)
Pt BP 59/40- paged [facility] with this information- Orders to bolus NS 500 ml @ 500 ml/hr and page back with BP

## 2019-08-20 NOTE — Consult Note (Addendum)
Consultation Note Date: 08/20/2019   Patient Name: Briana Andrews  DOB: 07/03/1966  MRN: [id number]  Age / Sex: 53 y.o., female  PCP: Patient, [name] Referring Physician: Hosie Poisson, MD  Reason for Consultation: Establishing goals of care   HPI/Patient Profile: 53 y.o. female  with past medical history of  admitted on 08/15/2019 with weakness and jaundice. 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 She has progressed during admission to cardiogenic shock and AKI. Her albumin is significantly decreased at 1.2. She remains severely encephalopathic and hypotensive. Palliative medicine consulted for [place]. She has been made DNR by critical care.    Clinical Assessment and Goals of Care:  I have reviewed medical records including EPIC notes, labs and imaging, assessed the patient and then met at the bedside along with patient's spouse  to discuss diagnosis prognosis, GOC, EOL wishes, disposition and options.  I introduced Palliative Medicine as specialized medical care for people living with serious illness. It focuses on providing relief from the symptoms and stress of a serious illness. The goal is to improve quality of life for both the patient and the family.  We discussed a brief life review of the patient. 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 She has two sons- Asreal and Republic.   As far as functional and nutritional status - Octavia Bruckner tells me there has been significant decline in her status especially in the last several months. He has noticed that she would just "lay in bed" and she would tell him that she's depressed.   We discussed her current illness and what it means in the larger context of her on-going  co-morbidities.  Natural disease trajectory and expectations at EOL were discussed.  Tim states that he worries that she will not survive this hospitalization. He states that she would not want to have "half a life". She would not want to live in a nursing home.   I attempted to elicit values and goals of care important to the patient. She did not value extensive medical care. She never sought medical care, even when it was apparent she was very ill.    The difference between aggressive medical intervention and comfort care was explained and considered in light of the patient's goals of care.   Advanced directives, concepts specific to code status, artifical feeding and hydration, and rehospitalization were considered and discussed.  Hospice and Palliative Care services outpatient were explained and offered.  Questions and concerns were addressed.  Hard Choices booklet left for review. The family was encouraged to call with questions or concerns.    Primary Decision Maker NEXT OF KIN- patient's spouse- Tim    SUMMARY OF RECOMMENDATIONS - Spouse wishes to continue current level of care without escalation for now- if patient declines further he would wish to transition to full comfort measures -If she fails to improve in  the next 24-48 hours he would also consider de-escalating care and transitioning to full comfort care    Code Status/Advance Care Planning:  DNR  Palliative Prophylaxis:   Delirium Protocol  Additional Recommendations (Limitations, Scope, Preferences):  No Artificial Feeding and No Hemodialysis  Prognosis:    Unable to determine  Discharge Planning: To Be Determined  Primary Diagnoses: Present on Admission: . Abnormal LFTs . Acute metabolic encephalopathy . SOB (shortness of breath) . AKI (acute kidney injury) ([place]) . Lactic acidosis . Back pain . Fall . Liver failure without hepatic coma ([place]) . Multifocal pneumonia . Cerebral embolism with cerebral  infarction . PVD (peripheral vascular disease) ([place]) . Left ventricular apical thrombus without MI   I have reviewed the medical record, interviewed the patient and family, and examined the patient. The following aspects are pertinent.  Past Medical History:  Diagnosis Date  . Alcohol abuse   . Tobacco abuse    Social History   Socioeconomic History  . Marital status: Married    Spouse name: Not on file  . Number of children: Not on file  . Years of education: Not on file  . Highest education level: Not on file  Occupational History  . Not on file  Social Needs  . Financial resource strain: Not on file  . Food insecurity    Worry: Not on file    Inability: Not on file  . Transportation needs    Medical: Not on file    Non-medical: Not on file  Tobacco Use  . Smoking status: Current Every Day Smoker  . Smokeless tobacco: Never Used  Substance and Sexual Activity  . Alcohol use: Yes  . Drug use: Yes    Types: Marijuana  . Sexual activity: Not on file  Lifestyle  . Physical activity    Days per week: Not on file    Minutes per session: Not on file  . Stress: Not on file  Relationships  . Social Herbalist on phone: Not on file    Gets together: Not on file    Attends religious service: Not on file    Active member of club or organization: Not on file    Attends meetings of clubs or organizations: Not on file    Relationship status: Not on file  Other Topics Concern  . Not on file  Social History Narrative  . Not on file   Family History  Problem Relation Age of Onset  . Heart disease Mother    Scheduled Meds: . Chlorhexidine Gluconate Cloth  6 each Topical Daily  . digoxin  0.125 mg Oral Daily  . folic acid  1 mg Oral Daily  . lactulose  10 g Oral BID  . multivitamin with minerals  1 tablet Oral Daily  . nicotine  21 mg Transdermal Daily  . octreotide  50 mcg Intravenous Once  . [START ON 08/24/2019] pantoprazole  40 mg Intravenous Q12H  .  potassium chloride  20 mEq Oral BID  . rifaximin  550 mg Oral BID  . sodium chloride flush  3 mL Intravenous Q12H  . sodium chloride flush  3 mL Intravenous Q12H   Continuous Infusions: . sodium chloride    . sodium chloride    . cefTRIAXone (ROCEPHIN)  IV    . DOBUTamine 2.5 mcg/kg/min (08/20/19 1714)  . heparin    . magnesium sulfate bolus IVPB    . octreotide  (SANDOSTATIN)    IV infusion    .  pantoprazole (PROTONIX) IVPB 80 mg (08/20/19 1719)  . pantoprozole (PROTONIX) infusion    . thiamine injection 500 mg (08/19/19 1741)   PRN Meds:.sodium chloride, acetaminophen, albuterol, dextromethorphan-guaiFENesin, hydrALAZINE, labetalol, ondansetron (ZOFRAN) IV, ondansetron **OR** [DISCONTINUED] ondansetron (ZOFRAN) IV, oxyCODONE, sodium chloride flush, sodium chloride flush, sodium chloride flush Medications Prior to Admission:  Prior to Admission medications   Medication Sig Start Date End Date Taking? Authorizing Provider  ibuprofen (ADVIL) 200 MG tablet Take 200 mg by mouth every 6 (six) hours as needed for mild pain.   Yes [provider]  naproxen (NAPROSYN) 500 MG tablet Take 1 tablet (500 mg total) by mouth 2 (two) times daily with a meal. Patient not taking: Reported on 08/16/2019 08/04/19   Johnn Hai, PA-C  traMADol (ULTRAM) 50 MG tablet Take 1 tablet (50 mg total) by mouth every 6 (six) hours as needed. Patient not taking: Reported on 08/16/2019 08/04/19   Johnn Hai, PA-C   Allergies  Allergen Reactions  . Penicillins Other (See Comments)    Unsure of reaction Did it involve swelling of the face/tongue/throat, SOB, or low BP? Unknown Did it involve sudden or severe rash/hives, skin peeling, or any reaction on the inside of your mouth or nose? Unknown Did you need to seek medical attention at a hospital or doctor's office? Unknown When did it last happen?[place] If all above answers are "NO", may proceed with cephalosporin use.   Review of  Systems  Unable to perform ROS   Physical Exam Vitals signs and nursing note reviewed.  Constitutional:      Comments: cachectic  Eyes:     General: Scleral icterus present.  Skin:    Coloration: Skin is jaundiced.  Neurological:     Mental Status: She is alert. She is disoriented.     Vital Signs: BP (!) 88/63   Pulse 87   Temp 97.8 F (36.6 C) (Oral)   Resp 15   Ht 5' 5"  (1.651 m)   Wt 81.2 kg   SpO2 100%   BMI 29.79 kg/m  Pain Scale: 0-10 POSS *See Group Information*: 1-Acceptable,Awake and alert Pain Score: 10-Worst pain ever   SpO2: SpO2: 100 % O2 Device:SpO2: 100 % O2 Flow Rate: .O2 Flow Rate (L/min): 2 L/min  IO: Intake/output summary:   Intake/Output Summary (Last 24 hours) at 08/20/2019 1721 Last data filed at 08/20/2019 1452 Gross per 24 hour  Intake 1652.73 ml  Output 2400 ml  Net -747.27 ml    LBM: Last BM Date: 08/20/19 Baseline Weight: Weight: 81.6 kg Most recent weight: Weight: 81.2 kg     Palliative Assessment/Data: PPS: 10%     Thank you for this consult. Palliative medicine will continue to follow and assist as needed.   Time In: 1530 Time Out: 1730 Time Total: 120 minutes Greater than 50%  of this time was spent counseling and coordinating care related to the above assessment and plan.  Signed by: Mariana Kaufman, AGNP-C Palliative Medicine    Please contact Palliative Medicine Team phone at [phone] for questions and concerns.  For individual provider: See Shea Evans

## 2019-08-20 NOTE — Progress Notes (Signed)
ANTICOAGULATION CONSULT NOTE - Follow Up Consult  Pharmacy Consult for heparin Indication: DVT, LV thrombus, and acute CVA  Labs: Recent Labs    08/17/19 0439  08/18/19 0407  08/18/19 2108 08/19/19 0425 08/20/19 0141  HGB 15.8*  --  14.5  --   --  11.9* 9.2*  HCT 45.8  --  42.1  --   --  33.0* 27.7*  PLT 106*  --  110*  --   --  107* 132*  APTT 33  --   --   --   --   --   --   LABPROT 21.6*  --  19.2*  --   --  19.1*  --   INR 1.9*  --  1.6*  --   --  1.6*  --   HEPARINUNFRC  --    < > 0.16*   < > 0.31 0.51 0.44  CREATININE 1.30*  --  1.48*  --   --  1.41* 1.51*   < > = values in this interval not displayed.    Assessment/Plan:  53yo female therapeutic on heparin after resumed. Will continue gtt at current rate and confirm stable with additional level.   Wynona Neat, PharmD, BCPS  08/20/2019,3:11 AM

## 2019-08-20 NOTE — Progress Notes (Signed)
Spoke with oliva RN MD states central line to be placed no longer needs PICC line

## 2019-08-20 NOTE — Progress Notes (Signed)
Pt SBP dropping in the 70's/80's Stool leaking around flexiseal appears bloody- Pt mental status is at baseline. Paged [facility] on call with this information-  Per Bodenheimer- NS bolus 250 @ 125/hr Hemoccult to the lab   While in the room morning labs were drawn will page [facility] when resulted

## 2019-08-20 NOTE — Progress Notes (Signed)
Pt had 22bts NSVT. Asymptomatic. Mg on am labs 1.6. Provider on call notified via [place]. Await orders. Jessie Foot, RN

## 2019-08-20 NOTE — Progress Notes (Addendum)
Pt on lactulose- stool is continually oozing out of patient. Paged [facility] on call for order for flexiseal.   Received order and placed flexiseal

## 2019-08-20 NOTE — Op Note (Signed)
Patient name: Briana Andrews MRN: [id number] DOB: 31-Mar-1966 Sex: female  08/20/2019 Pre-operative Diagnosis: Bilateral popliteal artery occlusions with lower extremity ischemia Post-operative diagnosis:  Same Surgeon:  Marty Heck, MD Procedure Performed: 1.  Ultrasound-guided access of the right common femoral artery 2.  Left lower extremity arteriogram with selection of third order branches 3.  Mechanical thrombectomy of the left popliteal and posterior tibial artery using the Penumbra CAT 6 device 4.  Intra-arterial administration of nitroglycerin 5.  Mynx closure of the right common femoral artery 6.  54 minutes of monitored moderate conscious sedation time  Indications: Patient is a 53 year old female who is suffering from cirrhosis with new heart failure and evidence of left ventricular thrombus.  She was seen over the weekend by Dr. Oneida Alar with evidence of bilateral popliteal artery occlusions and lower extremity ischemia.  She was felt to be a poor open surgical candidate.  She had a right lower extremity intervention yesterday with mechanical thrombectomy.  She presents today for left lower extremity intervention after risk and benefits were discussed with her husband.  Findings:  Aortogram was deferred since it was performed yesterday by Dr. Trula Slade.  Ultimately crossed the aortic bifurcation and selected the left SFA and performed left lower extremity arteriogram that again confirmed left popliteal artery occlusion.  Ultimately placed a destination sheath over the aortic bifurcation into the left SFA.  The left popliteal and posterior tibial artery was treated with mechanical thrombectomy using a CAT 6 penumbra device.  Nitroglycerin was given intra-arterial throughout the case to prevent spasm.  Patient now has inline flow down popliteal artery into the anterior tibial, peroneal, and posterior tibial artery.  I suspect this was embolic as I did not see an underlying lesion  that needed to be treated otherwise.  She has very small arteries.  She now has brisk dorsalis pedis and posterior tibial signals in the left foot.  Contrast: 45 mL's  EBL: 150 mL's   Procedure:  The patient was identified in the holding area and taken to room 8.  The patient was then placed supine on the table and prepped and draped in the usual sterile fashion.  A time out was called.  Ultrasound was used to evaluate the right common femoral artery.  It was patent .  A digital ultrasound image was acquired.  A micropuncture needle was used to access the right common femoral artery under ultrasound guidance.  An 018 wire was advanced without resistance and a micropuncture sheath was placed.  The 018 wire was removed and a benson wire was placed.  The micropuncture sheath was exchanged for a 5 french sheath.  An omniflush catheter was advanced over the wire to the level of L-1.  An abdominal aortogram was deferred since it was performed yesterday.  Ultimately used angled Glidewire with Omni Flush catheter to select the left iliac and cross the aortic bifurcation and ultimately got our crossover catheter ultimately into the left SFA.  Exchanged for a Rosen wire for more support.  A long 90 cm 7 [place] destination sheath was placed over the aortic bifurcation into the left SFA.  Patient was given 8000 units of IV heparin for 100 units/kg.  Then performed dedicated left lower extremity arteriogram that confirmed again left popliteal artery occlusion at the knee joint.  Then used a V 18 wire with an 018 quick cross and crossed the popliteal occlusion easily into the posterior tibial artery.  I did perform a brief hand-injection through the  quick cross once I was down the posterior tibial to confirm I was in the true lumen which I was.  That point in time I put my 018 wire back down the posterior tibial artery and gave nitroglycerin throughout the case to prevent vasospasm.  I used a CAT 6 mechanical thrombectomy  penumbra catheter and mechanical thrombectomy was performed of the left popliteal artery into the left posterior tibial artery.  Ultimately made three separate passes through the left popliteal artery until we had consistent backbleeding from the catheter.  It should be noted that we retrieved at least three separate plugs of acute appearing thrombus in the canister.  At that point in time several additional dedicated left lower extremity images were obtained through injection of the sheath after giving nitroglycein.  The popliteal artery now is patent with inline flow down the anterior tibial, peroneal, and posterior tibial artery.  I did not see an underlying lesion that needed to be angioplastied.  Her arteries are relatively small so I elected not to perform any further intervention.  She has brisk dorsalis pedis and posterior tibial signals in the left foot.  That point in time exchanged for a short 7 French sheath in the right groin.  All other wires and catheters were removed.  A mynx closure device was deployed in the right common femoral artery and manual pressure was held for 5 minutes.  Taken to recovery in stable condition.   Cephus Shelling, MD [facility] Office: [phone] Pager: [phone]  Cephus Shelling

## 2019-08-20 NOTE — Progress Notes (Signed)
PROGRESS NOTE    Madylyn Insco  RXV:[id number] DOB: 06/26/1966 DOA: 08/15/2019 PCP: Patient, [name]   Brief Narrative:  53 year old lady with prior history of alcoholic liver cirrhosis, alcohol abuse was initially admitted on 08/16/2019 with altered mental status she was found to have subacute right frontal ACA territory CVA and ischemic cardiomyopathy with left ventricular ejection fraction of 15 to 20% with large LV thrombus and bilateral lower extremity ischemia showing bilateral popliteal artery occlusions.  She was also found to have been positive stools.  Neurology consulted and recommendations given.  Vascular surgery consulted and she underwent mechanical thrombectomy of the right superficial femoral popliteal and posterior tibial artery and angioplasty of the right superficial femoral popliteal and posterior tibial artery, and mechanical thrombectomy of the left popliteal and posterior tibial artery.  Heart failure on board for cardiogenic shock. Patient remains lethargic, hypotensive and ill-appearing.  PCCM consulted for for hypotension and vasopressors support.   Assessment & Plan:   Principal Problem:   Cerebral embolism with cerebral infarction Active Problems:   Abnormal LFTs   Acute metabolic encephalopathy   SOB (shortness of breath)   AKI (acute kidney injury) (HCC)   Fall   Lactic acidosis   Back pain   Liver failure without hepatic coma (HCC)   Multifocal pneumonia   PVD (peripheral vascular disease) (HCC)   Left ventricular apical thrombus without MI   Acute CHF (congestive heart failure) (HCC)   Jaundice   Subacute right frontoparietal ACA territory infarct with multiple right caudate and cerebellar infarcts Neurology consulted and recommendations to continue with IV heparin for the large fixed LV thrombus.  Echocardiogram showed left ventricular ejection fraction of 15 to 20%. Hemoglobin A1c, LDL within normal limit Therapy evaluations IR   Acute  metabolic encephalopathy probably secondary to acute hepatic encephalopathy  in the setting of alcoholic liver cirrhosis and recent subacute embolic CVA She remains lethargic and answering to simple questions.  Continue with the lactulose. Appreciate GI recommendations.   Guaiac positive stools and acute blood loss anemia possibly from  variceal bleed GI on board and recommending octreotide and Protonix infusions.  Serial H&H every 6 hours and transfuse to keep hemoglobin greater than 7.  Biventricular heart failure with cardiogenic shock with hypovolemia and hypotension Transferred to ICU and patient will need inotrope support per heart failure team.    Left ventricular thrombus Continue with IV heparin.  Bilateral lower leg ischemia/bilateral popliteal artery occlusions Vascular surgery consulted and she underwent thrombectomy of the popliteal artery occlusions.  Continue with IV heparin.   Bilateral lower lobe opacities suspicious for bilateral pneumonia/aspiration pneumonia Was on IV Levaquin changed to Rocephin.    Acute kidney injury probably secondary to hypovolemia Continue to monitor and hydrate.    Worsening leukocytosis Get pro calcitonin.   Mild thrombocytopenia probably secondary to liver disease.   In view of multiple medical problems and clinical deterioration palliative care consulted for goals of care discussion  DVT prophylaxis: (Heparin Code Status: DNR Family Communication: None at bedside  disposition Plan: Critically ill and transferred to ICU as per PCCM recommendations   Consultants:   Vascular surgery  PCCM  Cardiology  Gastroenterology  Palliative care  Procedures:  Aortogram.   Mechanical thrombectomy of the right superficial femoral popliteal and posterior tibial artery and angioplasty of the right superficial femoral popliteal and posterior tibial artery, and mechanical thrombectomy of the left popliteal and posterior tibial  artery.  Antimicrobials:rocephin.    Subjective: Lethargic. Moaning.  Objective: Vitals:   08/20/19 1135 08/20/19 1233 08/20/19 1300 08/20/19 1328  BP: 107/67   (!) 89/65  Pulse: (!) 102  88 87  Resp: 11     Temp:  98.3 F (36.8 C)    TempSrc:  Oral    SpO2: 100%  100% 100%  Weight:      Height:        Intake/Output Summary (Last 24 hours) at 08/20/2019 1352 Last data filed at 08/20/2019 1233 Gross per 24 hour  Intake 1652.73 ml  Output 1950 ml  Net -297.27 ml   Filed Weights   08/15/19 2345 08/20/19 0519  Weight: 81.6 kg 81.2 kg    Examination:  General exam: ill appearing, lethargic. Not in distress.  Respiratory system: coarse breath sounds bilaterally.  Cardiovascular system: s1s2, tachcardic, no murmer,  Gastrointestinal system: Abdomen is nondistended, soft and nontender. No organomegaly or masses felt. Normal bowel sounds heard. Central nervous system: Confused, lethargic.  Extremities: no edema.  Skin: No rashes, lesions or ulcers Psychiatry:confused.     Data Reviewed: I have personally reviewed following labs and imaging studies  CBC: Recent Labs  Lab 08/15/19 2350 08/16/19 1249 08/17/19 0439 08/18/19 0407 08/19/19 0425 08/20/19 0141  WBC 9.7  --  12.0* 12.2* 15.6* 20.1*  NEUTROABS 7.7  --   --   --   --   --   HGB 16.9*  --  15.8* 14.5 11.9* 9.2*  HCT 48.6* 44.5 45.8 42.1 33.0* 27.7*  MCV 100.8*  --  101.6* 101.7* 98.8 103.7*  PLT 134*  --  106* 110* 107* 209*   Basic Metabolic Panel: Recent Labs  Lab 08/15/19 2350 08/17/19 0439 08/18/19 0407 08/19/19 0425 08/20/19 0141  NA 133* 130* 132* 134* 136  K 4.1 4.4 4.3 2.7* 3.7  CL 97* 100 100 100 105  CO2 21* 18* 19* 23 25  GLUCOSE 119* 135* 83 98 92  BUN 47* 41* 39* 47* 67*  CREATININE 1.64* 1.30* 1.48* 1.41* 1.51*  CALCIUM 8.7* 8.0* 7.9* 7.6* 7.5*  MG  --   --  1.9  --  1.6*  PHOS  --   --   --   --  3.5   GFR: Estimated Creatinine Clearance: 44.8 mL/min (A) (by C-G formula  based on SCr of 1.51 mg/dL (H)). Liver Function Tests: Recent Labs  Lab 08/15/19 2350 08/17/19 0439 08/18/19 0407 08/19/19 0425 08/20/19 0141  AST 59* 54* 69* 48* 44*  ALT 43 39 38 35 36  ALKPHOS 83 76 75 66 70  BILITOT 12.7* 12.1* 11.8* 9.3* 7.7*  PROT 7.7 6.5 5.9* 4.7* 4.7*  ALBUMIN 2.4* 1.9* 1.7* 1.3* 1.2*   Recent Labs  Lab 08/15/19 2350  LIPASE 49   Recent Labs  Lab 08/15/19 2350 08/17/19 0439 08/18/19 0407  AMMONIA 18 39* 42*   Coagulation Profile: Recent Labs  Lab 08/16/19 0016 08/17/19 0439 08/18/19 0407 08/19/19 0425 08/20/19 0141  INR 1.7* 1.9* 1.6* 1.6* 1.7*   Cardiac Enzymes: Recent Labs  Lab 08/16/19 1948  CKTOTAL 196   BNP (last 3 results) No results for input(s): PROBNP in the last 8760 hours. HbA1C: No results for input(s): HGBA1C in the last 72 hours. CBG: Recent Labs  Lab 08/16/19 1028  GLUCAP 105*   Lipid Profile: No results for input(s): CHOL, HDL, LDLCALC, TRIG, CHOLHDL, LDLDIRECT in the last 72 hours. Thyroid Function Tests: No results for input(s): TSH, T4TOTAL, FREET4, T3FREE, THYROIDAB in the last 72 hours. Anemia Panel: No results  for input(s): VITAMINB12, FOLATE, FERRITIN, TIBC, IRON, RETICCTPCT in the last 72 hours. Sepsis Labs: Recent Labs  Lab 08/15/19 2350 08/16/19 0400  LATICACIDVEN 2.8* 2.0*    Recent Results (from the past 240 hour(s))  SARS Coronavirus 2 [facility] order, Performed in [facility] hospital lab) Nasopharyngeal Nasopharyngeal Swab     Status: None   Collection Time: 08/16/19  3:03 AM   Specimen: Nasopharyngeal Swab  Result Value Ref Range Status   SARS Coronavirus 2 NEGATIVE NEGATIVE Final    Comment: (NOTE) If result is NEGATIVE SARS-CoV-2 target nucleic acids are NOT DETECTED. The SARS-CoV-2 RNA is generally detectable in upper and lower  respiratory specimens during the acute phase of infection. The lowest  concentration of SARS-CoV-2 viral copies this assay can detect is 250  copies /  mL. A negative result does not preclude SARS-CoV-2 infection  and should not be used as the sole basis for treatment or other  patient management decisions.  A negative result may occur with  improper specimen collection / handling, submission of specimen other  than nasopharyngeal swab, presence of viral mutation(s) within the  areas targeted by this assay, and inadequate number of viral copies  (<250 copies / mL). A negative result must be combined with clinical  observations, patient history, and epidemiological information. If result is POSITIVE SARS-CoV-2 target nucleic acids are DETECTED. The SARS-CoV-2 RNA is generally detectable in upper and lower  respiratory specimens dur ing the acute phase of infection.  Positive  results are indicative of active infection with SARS-CoV-2.  Clinical  correlation with patient history and other diagnostic information is  necessary to determine patient infection status.  Positive results do  not rule out bacterial infection or co-infection with other viruses. If result is PRESUMPTIVE POSTIVE SARS-CoV-2 nucleic acids MAY BE PRESENT.   A presumptive positive result was obtained on the submitted specimen  and confirmed on repeat testing.  While 2019 novel coronavirus  (SARS-CoV-2) nucleic acids may be present in the submitted sample  additional confirmatory testing may be necessary for epidemiological  and / or clinical management purposes  to differentiate between  SARS-CoV-2 and other Sarbecovirus currently known to infect humans.  If clinically indicated additional testing with an alternate test  methodology [phone]) is advised. The SARS-CoV-2 RNA is generally  detectable in upper and lower respiratory sp ecimens during the acute  phase of infection. The expected result is Negative. Fact Sheet for Patients:  BoilerBrush.com.cy Fact Sheet for Healthcare Providers: https://pope.com/ This test is  not yet approved or cleared by the [place] FDA and has been authorized for detection and/or diagnosis of SARS-CoV-2 by FDA under an Emergency Use Authorization (EUA).  This EUA will remain in effect (meaning this test can be used) for the duration of the COVID-19 declaration under Section 564(b)(1) of the Act, 21 U.S.C. section 360bbb-3(b)(1), unless the authorization is terminated or revoked sooner. Performed at [facility], [address], [place], [place] [address]   Culture, blood (routine x 2) Call MD if unable to obtain prior to antibiotics being given     Status: None (Preliminary result)   Collection Time: 08/16/19 10:16 AM   Specimen: BLOOD RIGHT WRIST  Result Value Ref Range Status   Specimen Description BLOOD RIGHT WRIST  Final   Special Requests   Final    BOTTLES DRAWN AEROBIC AND ANAEROBIC Blood Culture adequate volume   Culture   Final    NO GROWTH 4 DAYS Performed at [facility] Lab, [address] [place], [place] [address]    Report Status PENDING  Incomplete  Culture, blood (routine x 2) Call MD if unable to obtain prior to antibiotics being given     Status: None (Preliminary result)   Collection Time: 08/16/19 10:51 AM   Specimen: BLOOD  Result Value Ref Range Status   Specimen Description BLOOD RIGHT ANTECUBITAL  Final   Special Requests   Final    BOTTLES DRAWN AEROBIC AND ANAEROBIC Blood Culture results may not be optimal due to an excessive volume of blood received in culture bottles   Culture   Final    NO GROWTH 4 DAYS Performed at [facility], [address] [place], [place] [address]    Report Status PENDING  Incomplete  Respiratory Panel by PCR     Status: None   Collection Time: 08/16/19 11:40 AM   Specimen: Nasopharyngeal Swab; Respiratory  Result Value Ref Range Status   Adenovirus NOT DETECTED NOT DETECTED Final   Coronavirus 229E NOT DETECTED NOT DETECTED Final    Comment: (NOTE) The Coronavirus on the Respiratory  Panel, DOES NOT test for the novel  Coronavirus (2019 nCoV)    Coronavirus HKU1 NOT DETECTED NOT DETECTED Final   Coronavirus NL63 NOT DETECTED NOT DETECTED Final   Coronavirus OC43 NOT DETECTED NOT DETECTED Final   Metapneumovirus NOT DETECTED NOT DETECTED Final   Rhinovirus / Enterovirus NOT DETECTED NOT DETECTED Final   Influenza A NOT DETECTED NOT DETECTED Final   Influenza B NOT DETECTED NOT DETECTED Final   Parainfluenza Virus 1 NOT DETECTED NOT DETECTED Final   Parainfluenza Virus 2 NOT DETECTED NOT DETECTED Final   Parainfluenza Virus 3 NOT DETECTED NOT DETECTED Final   Parainfluenza Virus 4 NOT DETECTED NOT DETECTED Final   Respiratory Syncytial Virus NOT DETECTED NOT DETECTED Final   Bordetella pertussis NOT DETECTED NOT DETECTED Final   Chlamydophila pneumoniae NOT DETECTED NOT DETECTED Final   Mycoplasma pneumoniae NOT DETECTED NOT DETECTED Final    Comment: Performed at [facility], [address] [place], [place] [address]  Culture, Urine     Status: None   Collection Time: 08/18/19 12:00 PM   Specimen: Urine, Clean Catch  Result Value Ref Range Status   Specimen Description URINE, CLEAN CATCH  Final   Special Requests NONE  Final   Culture   Final    NO GROWTH Performed at [facility]  Lab, [address] [place], [place] [address]    Report Status 08/19/2019 FINAL  Final         Radiology Studies: [place] Ekg Site Rite  Result Date: 08/20/2019 If Site Rite image not attached, placement could not be confirmed due to current cardiac rhythm.       Scheduled Meds: . Chlorhexidine Gluconate Cloth  6 each Topical Daily  . digoxin  0.125 mg Oral Daily  . folic acid  1 mg Oral Daily  . lactulose  10 g Oral BID  . levofloxacin  750 mg Oral Q48H  . multivitamin with minerals  1 tablet Oral Daily  . nicotine  21 mg Transdermal Daily  . potassium chloride  20 mEq Oral BID  . rifaximin  550 mg Oral BID  . sodium chloride flush  3 mL Intravenous Q12H   . sodium chloride flush  3 mL Intravenous Q12H   Continuous Infusions: . sodium chloride    . sodium chloride    . sodium chloride    .  DOBUTamine    . heparin 1,750 Units/hr (08/20/19 [id number])  . magnesium sulfate bolus IVPB    . thiamine injection 500 mg (08/19/19 1741)     LOS: 4 days        Kathlen ModyVijaya Fraidy Mccarrick, MD [facility] Pager [phone]  If 7PM-7AM, please contact night-coverage [email or web address] Password [email or web address] 08/20/2019, 1:52 PM

## 2019-08-20 NOTE — Procedures (Signed)
Central Venous Catheter Insertion Procedure Note Maezie Justin [id number] 1965/12/25  Procedure: Insertion of Central Venous Catheter Indications: Drug and/or fluid administration  Procedure Details Consent: Risks of procedure as well as the alternatives and risks of each were explained to the (patient/caregiver).  Consent for procedure obtained. Time Out: Verified patient identification, verified procedure, site/side was marked, verified correct patient position, special equipment/implants available, medications/allergies/relevent history reviewed, required imaging and test results available.  Performed  Maximum sterile technique was used including antiseptics, cap, gloves, gown, hand hygiene, mask and sheet. Skin prep: Chlorhexidine; local anesthetic administered A antimicrobial bonded/coated triple lumen catheter was placed in the right internal jugular vein using the Seldinger technique and verified with [place]  Evaluation Blood flow good Complications: No apparent complications Patient did tolerate procedure well. Chest X-ray ordered to verify placement.  CXR: pending.  Otilio Carpen Riann Oman 08/20/2019, 4:14 PM

## 2019-08-20 NOTE — Progress Notes (Signed)
[facility]  Subjective  - confused   Objective (!) 81/51 84 98 F (36.7 C) (Oral) 18 97%  Intake/Output Summary (Last 24 hours) at 08/20/2019 0801 Last data filed at 08/20/2019 3235 Gross per 24 hour  Intake 1652.73 ml  Output 1600 ml  Net 52.73 ml   Left groin no hematoma Right foot toes more pink Left foot improved but toes still dusky  Assessment/Planning: S/p right popliteal angioplasty Will return to [facility] lab today to do contralateral leg Consent  Keep NPO  D/c heparin on call  Ruta Hinds 08/20/2019 8:01 AM --  Laboratory Lab Results: Recent Labs    08/19/19 0425 08/20/19 0141  WBC 15.6* 20.1*  HGB 11.9* 9.2*  HCT 33.0* 27.7*  PLT 107* 132*   BMET Recent Labs    08/19/19 0425 08/20/19 0141  NA 134* 136  K 2.7* 3.7  CL 100 105  CO2 23 25  GLUCOSE 98 92  BUN 47* 67*  CREATININE 1.41* 1.51*  CALCIUM 7.6* 7.5*    COAG Lab Results  Component Value Date   INR 1.7 (H) 08/20/2019   INR 1.6 (H) 08/19/2019   INR 1.6 (H) 08/18/2019   No results found for: PTT

## 2019-08-20 NOTE — Progress Notes (Signed)
[facility] Gastroenterology Progress Note  Briana Andrews 53 y.o. [birth year]  CC:  alcoholic hepatitis with encephalopathy, LV thrombus, stroke   Subjective: Patient seen and examined at bedside.  Husband at bedside.  Patient remains somewhat confused.  Flexi-Seal has melanotic stool.  ROS : Not able to obtain   Objective: Vital signs in last 24 hours: Vitals:   08/20/19 1300 08/20/19 1328  BP:  (!) 89/65  Pulse: 88 87  Resp:    Temp:    SpO2: 100% 100%    Physical Exam:  General:  Alert,no distress, appears stated age  Head:  Normocephalic, without obvious abnormality, atraumatic  Eyes:   Scleral icterus noted  Lungs:    Bibasilar rales.  Anterior exam only  Heart:  Regular rate and rhythm, S1, S2 normal  Abdomen:    Abdomen mildly distended, mild epigastric discomfort on palpation, bowel sounds present.  No peritoneal sign  Neuro.   Somewhat confused       Lab Results: Recent Labs    08/18/19 0407 08/19/19 0425 08/20/19 0141  NA 132* 134* 136  K 4.3 2.7* 3.7  CL 100 100 105  CO2 19* 23 25  GLUCOSE 83 98 92  BUN 39* 47* 67*  CREATININE 1.48* 1.41* 1.51*  CALCIUM 7.9* 7.6* 7.5*  MG 1.9  --  1.6*  PHOS  --   --  3.5   Recent Labs    08/19/19 0425 08/20/19 0141  AST 48* 44*  ALT 35 36  ALKPHOS 66 70  BILITOT 9.3* 7.7*  PROT 4.7* 4.7*  ALBUMIN 1.3* 1.2*   Recent Labs    08/19/19 0425 08/20/19 0141  WBC 15.6* 20.1*  HGB 11.9* 9.2*  HCT 33.0* 27.7*  MCV 98.8 103.7*  PLT 107* 132*   Recent Labs    08/19/19 0425 08/20/19 0141  LABPROT 19.1* 20.0*  INR 1.6* 1.7*      Assessment/Plan: -Jaundice.  Significantly elevated T bili at 12.7 with mild elevated AST at 59.  Normal ALT and alkaline phosphatase.  CT scan showed normal-appearing liver and pancreas.  No biliary dilatation.  No gallstones or gall bladder wall thickening.  Most likely from alcoholic hepatitis. -Altered mental status.  MRI concerning for recent stroke. -Newly diagnosed  biventricular CHF with EF of only 15 to 20% with large fixed thrombus on apical septum. -Shock.  Combined cardiogenic and septic. -Lower limb ischemia.  Status post thrombectomy. -Melena with acute blood loss anemia.  Recommendations ---------------------------- -Start Protonix drip and octreotide drip. -She is currently getting transferred to ICU. -If ongoing melena, may consider EGD for further evaluation but patient remains at high risk for perioperative/postoperative complications given underlying multiple medical comorbidities.  This was discussed with patient's family at bedside.  -Very poor prognosis   Otis Brace MD, [place] 08/20/2019, 2:46 PM  Contact #  [phone]

## 2019-08-20 NOTE — Consult Note (Addendum)
Advanced Heart Failure Team Consult Note   Primary Physician: Patient, [name] PCP-Cardiologist:  Ena Dawley, MD  Reason for Consultation: Heart Failure   HPI:    Briana Andrews is seen today for evaluation of heart failure at the request of Dr Debara Pickett.    Briana Andrews 53 year old with a history of ETOH abuse and tobacco use.   Presented to [facility] ED with increased shortness of breath and confusion. Pertinent admission labs included WBC, 9.7, Hgb 16.9, platelets 134, Na+ 133, K+ 4.1, creatinine 1.64, AST 59, ALT 43, total bilirubin 12.7, lactic Acid 2.8, INR 1.7, and Covid 19 test was negative. CXR was concerning for multifocal airspace disease/mild cardiomegaly. Placed on antibiotics for possible pneumonia. MRI brain showed R ACA stroke.  CT abdomen showed moderate pleural effusion and moderate pleural effusion. ECHo completed and showed severely reduced EF 15-20%, large fixed thrombus on apical septal wall, and normal RV. GI consulted to for liver failure,  Cardiology consulted for newly reduced EF,  Vascular consulted for bilateral ischemic feet. [place] lower extremity showed bilateral  total popliteal artery occlusion with right popliteal vein DVT. S/P bilateral mechanical thrombectomy.    Over night hypotensive. Given 500 cc bolus. BP has been low with SBP < 90.  Due to concern for low output HF we were asked to consult for possible inotropes.  Remains confused. Complains of pain in her legs.  Echo 08/16/19   1. Left ventricular ejection fraction, by visual estimation, is approximately 15-20%. Mildly increased left ventricular size. There is borderline left ventricular hypertrophy.  2. Large, fixed thrombus on the apical septal wall of the left ventricle measuring 3.1 x 1.4 cm.  3. The average left ventricular global longitudinal strain is -4.0 %.  4. Left ventricular diastolic Doppler parameters are consistent with pseudonormalization pattern of LV diastolic filling.  5. Left  atrial size was moderately dilated.  6. Mild to moderate mitral annular calcification.  7. The mitral valve is degenerative. Mild to moderate mitral valve regurgitation.  8. The tricuspid valve is grossly normal. Tricuspid valve regurgitation is mild.  9. The pulmonic valve was grossly normal. Pulmonic valve regurgitation is trivial by color flow Doppler. 10. Mildly elevated pulmonary artery systolic pressure. 11. The aortic valve has an indeterminant number of cusps, possibly functionally bicuspid. Aortic valve regurgitation is trivial by color flow Doppler. Mild aortic valve sclerosis without stenosis. 12. Right atrial size was normal. 13. Multiple segmental abnormalities exist. See findings. 14. The inferior vena cava is dilated in size with >50% respiratory variability, suggesting right atrial pressure of 8 mmHg. 15. Global right ventricle has severely reduced systolic function.The right ventricular size is normal. No increase in right ventricular wall thickness. Review of Systems: [y] = yes, [ ]  = no Unable to obtain history from the patient due to confusion. Obtained from the chart.   . General: Weight gain [ ] ; Weight loss [ ] ; Anorexia [ ] ; Fatigue [ ] ; Fever [ ] ; Chills [ ] ; Weakness [ ]   . Cardiac: Chest pain/pressure [ ] ; Resting SOB [ ] ; Exertional SOB [ ] ; Orthopnea [ ] ; Pedal Edema [ ] ; Palpitations [ ] ; Syncope [ ] ; Presyncope [ ] ; Paroxysmal nocturnal dyspnea[ ]   . Pulmonary: Cough [ ] ; Wheezing[ ] ; Hemoptysis[ ] ; Sputum [ ] ; Snoring [ ]   . GI: Vomiting[ ] ; Dysphagia[ ] ; Melena[ ] ; Hematochezia [ ] ; Heartburn[ ] ; Abdominal pain [ ] ; Constipation [ ] ; Diarrhea [ ] ; BRBPR [ ]   . GU:  Hematuria[ ] ; Dysuria [ ] ; Nocturia[ ]   . Vascular: Pain in legs with walking [ Y]; Pain in feet with lying flat [ ] ; Non-healing sores [ ] ; Stroke [ ] ; TIA [ ] ; Slurred speech [ ] ;  . Neuro: Headaches[ ] ; Vertigo[ ] ; Seizures[ ] ; Paresthesias[ ] ;Blurred vision [ ] ; Diplopia [ ] ; Vision changes [ ]   .  Ortho/Skin: Arthritis [ ] ; Joint pain [Y ]; Muscle pain [ ] ; Joint swelling [ ] ; Back Pain [ ] ; Rash [ ]   . Psych: Depression[ ] ; Anxiety[ ]   . Heme: Bleeding problems [ ] ; Clotting disorders [ ] ; Anemia [ ]   . Endocrine: Diabetes [ ] ; Thyroid dysfunction[ ]   Home Medications Prior to Admission medications   Medication Sig Start Date End Date Taking? Authorizing Provider  ibuprofen (ADVIL) 200 MG tablet Take 200 mg by mouth every 6 (six) hours as needed for mild pain.   Yes [provider]  naproxen (NAPROSYN) 500 MG tablet Take 1 tablet (500 mg total) by mouth 2 (two) times daily with a meal. Patient not taking: Reported on 08/16/2019 08/04/19   Tommi Rumps, PA-C  traMADol (ULTRAM) 50 MG tablet Take 1 tablet (50 mg total) by mouth every 6 (six) hours as needed. Patient not taking: Reported on 08/16/2019 08/04/19   Eather Colas    Past Medical History: Past Medical History:  Diagnosis Date  . Alcohol abuse   . Tobacco abuse     Past Surgical History: Past Surgical History:  Procedure Laterality Date  . ABDOMINAL AORTOGRAM W/LOWER EXTREMITY N/A 08/19/2019   Procedure: ABDOMINAL AORTOGRAM W/LOWER EXTREMITY;  Surgeon: Nada Libman, MD;  Location: [facility] INVASIVE CV LAB;  Service: Cardiovascular;  Laterality: N/A;  . PERIPHERAL VASCULAR BALLOON ANGIOPLASTY  08/19/2019   Procedure: PERIPHERAL VASCULAR BALLOON ANGIOPLASTY;  Surgeon: Nada Libman, MD;  Location: [facility] INVASIVE CV LAB;  Service: Cardiovascular;;  Right femoral popliteal and PT     Family History: Family History  Problem Relation Age of Onset  . Heart disease Mother     Social History: Social History   Socioeconomic History  . Marital status: Married    Spouse name: Not on file  . Number of children: Not on file  . Years of education: Not on file  . Highest education level: Not on file  Occupational History  . Not on file  Social Needs  . Financial resource strain: Not on file  . Food  insecurity    Worry: Not on file    Inability: Not on file  . Transportation needs    Medical: Not on file    Non-medical: Not on file  Tobacco Use  . Smoking status: Current Every Day Smoker  . Smokeless tobacco: Never Used  Substance and Sexual Activity  . Alcohol use: Yes  . Drug use: Yes    Types: Marijuana  . Sexual activity: Not on file  Lifestyle  . Physical activity    Days per week: Not on file    Minutes per session: Not on file  . Stress: Not on file  Relationships  . Social Musician on phone: Not on file    Gets together: Not on file    Attends religious service: Not on file    Active member of club or organization: Not on file    Attends meetings of clubs or organizations: Not on file    Relationship status: Not on file  Other Topics Concern  . Not  on file  Social History Narrative  . Not on file    Allergies:  Allergies  Allergen Reactions  . Penicillins Other (See Comments)    Unsure of reaction Did it involve swelling of the face/tongue/throat, SOB, or low BP? Unknown Did it involve sudden or severe rash/hives, skin peeling, or any reaction on the inside of your mouth or nose? Unknown Did you need to seek medical attention at a hospital or doctor's office? Unknown When did it last happen?[place] If all above answers are "NO", may proceed with cephalosporin use.    Objective:    Vital Signs:   Temp:  [98 F (36.7 C)] 98 F (36.7 C) (09/23 0519) Pulse Rate:  [73-102] 102 (09/23 1135) Resp:  [9-42] 11 (09/23 1135) BP: (81-126)/(42-84) 107/67 (09/23 1135) SpO2:  [0 %-100 %] 100 % (09/23 1135) Weight:  [81.2 kg] 81.2 kg (09/23 0519) Last BM Date: 08/20/19  Weight change: Filed Weights   08/15/19 2345 08/20/19 0519  Weight: 81.6 kg 81.2 kg    Intake/Output:   Intake/Output Summary (Last 24 hours) at 08/20/2019 1210 Last data filed at 08/20/2019 0523 Gross per 24 hour  Intake 1652.73 ml  Output 1600 ml  Net 52.73 ml       Physical Exam    General:  Jaundiced. Appears chronically ill. Moaning  HEENT: normal Neck: supple. JVP hard to assess . Carotids 2+ bilat; no bruits. No lymphadenopathy or thyromegaly appreciated. Cor: PMI nondisplaced. Regular rate & rhythm. No rubs, gallops or murmurs. Lungs: Decreased on the left. On nasal cannula.  Abdomen: soft, nontender, nondistended. No hepatosplenomegaly. No bruits or masses. Good bowel sounds. Flexi Seal with black stool  Extremities: clubbing, rash, R foot with purple toes. RLE 1+ edema LLE trace edema.  Neuro: alert & orientedx1 to self. Cranial nerves grossly intact. moves all 4 extremities w/o difficulty. Moaning    Telemetry   SR with NSVT 90s personally reviewed.   EKG      Labs   Basic Metabolic Panel: Recent Labs  Lab 08/15/19 2350 08/17/19 0439 08/18/19 0407 08/19/19 0425 08/20/19 0141  NA 133* 130* 132* 134* 136  K 4.1 4.4 4.3 2.7* 3.7  CL 97* 100 100 100 105  CO2 21* 18* 19* 23 25  GLUCOSE 119* 135* 83 98 92  BUN 47* 41* 39* 47* 67*  CREATININE 1.64* 1.30* 1.48* 1.41* 1.51*  CALCIUM 8.7* 8.0* 7.9* 7.6* 7.5*  MG  --   --  1.9  --  1.6*  PHOS  --   --   --   --  3.5    Liver Function Tests: Recent Labs  Lab 08/15/19 2350 08/17/19 0439 08/18/19 0407 08/19/19 0425 08/20/19 0141  AST 59* 54* 69* 48* 44*  ALT 43 39 38 35 36  ALKPHOS 83 76 75 66 70  BILITOT 12.7* 12.1* 11.8* 9.3* 7.7*  PROT 7.7 6.5 5.9* 4.7* 4.7*  ALBUMIN 2.4* 1.9* 1.7* 1.3* 1.2*   Recent Labs  Lab 08/15/19 2350  LIPASE 49   Recent Labs  Lab 08/15/19 2350 08/17/19 0439 08/18/19 0407  AMMONIA 18 39* 42*    CBC: Recent Labs  Lab 08/15/19 2350 08/16/19 1249 08/17/19 0439 08/18/19 0407 08/19/19 0425 08/20/19 0141  WBC 9.7  --  12.0* 12.2* 15.6* 20.1*  NEUTROABS 7.7  --   --   --   --   --   HGB 16.9*  --  15.8* 14.5 11.9* 9.2*  HCT 48.6* 44.5 45.8 42.1 33.0* 27.7*  MCV 100.8*  --  101.6* 101.7* 98.8 103.7*  PLT 134*  --  106* 110*  107* 132*    Cardiac Enzymes: Recent Labs  Lab 08/16/19 1948  CKTOTAL 196    BNP: BNP (last 3 results) No results for input(s): BNP in the last 8760 hours.  ProBNP (last 3 results) No results for input(s): PROBNP in the last 8760 hours.   CBG: Recent Labs  Lab 08/16/19 1028  GLUCAP 105*    Coagulation Studies: Recent Labs    08/18/19 0407 08/19/19 0425 08/20/19 0141  LABPROT 19.2* 19.1* 20.0*  INR 1.6* 1.6* 1.7*     Imaging    No results found.   Medications:     Current Medications: . [MAR Hold] Chlorhexidine Gluconate Cloth  6 each Topical Daily  . [MAR Hold] digoxin  0.125 mg Oral Daily  . [MAR Hold] folic acid  1 mg Oral Daily  . [MAR Hold] furosemide  40 mg Intravenous Daily  . [MAR Hold] lactulose  10 g Oral BID  . [MAR Hold] levofloxacin  750 mg Oral Q48H  . [MAR Hold] multivitamin with minerals  1 tablet Oral Daily  . [MAR Hold] nicotine  21 mg Transdermal Daily  . [MAR Hold] potassium chloride  20 mEq Oral BID  . [MAR Hold] rifaximin  550 mg Oral BID  . [MAR Hold] sodium chloride flush  3 mL Intravenous Q12H  . [MAR Hold] spironolactone  12.5 mg Oral Daily     Infusions: . sodium chloride 100 mL/hr at 08/19/19 [id number]  . [MAR Hold] sodium chloride    . [MAR Hold] sodium chloride    . heparin 1,750 Units/hr (08/20/19 11910613)  . thiamine injection 500 mg (08/19/19 1741)        Assessment/Plan   1. Shock  Septic Shock + Cardiogenic Shock  Blood Cultures 9/19 - NGTD Urine Cultures No gorwth.  WBC trending up to 20. Possible source R foot / PNA. On levoquin. Severely reduced EF.   2. CVA, Subacute R ACA Stroke Per Neuro   3.  Acute Biventricular Heart Failure , Cardiogenic Shock  ECHO EF severely reduced 15%.  Place PICC now. Check CO-OX will need to start dobutamine when we have access.  BP in the 70-80s.  -Stop IV lasix. Stop spiro. -Continue digoxin.  Volume status difficult to assess. Check CVP   4 Apical Thrombus On  heparin drip.   5 Elevated Bilirubin, ETOH abuse  Bilirubin 12.7 on admit. GI consulted. Hepatitis panel negative. Placed on rifaximin+ lactulose    Bilirubin down to 7.7   6.  CKD  Elevated creatinine. Unknown baseline.   7.  NSVT  Mag 9/23 was 1.6. Give 2 grams of Mag  8. Bilateral Lower Extremity Ischemia   Vascular following. 9/22  Arteriogram- R popliteal artery occlusion treated with thrombectomy 9/23 L popliteal artery occlusion treated with thrombectomy   Agree with Palliative Care for goals of care.    Length of Stay: 4  Amy Clegg, NP  08/20/2019, 12:10 PM  Advanced Heart Failure Team Pager [phone] (M-F; 7a - 4p)  Please contact [facility] for night-coverage after hours (4p -7a ) and weekends on [email or web address]  Patient seen with NP, agree with the above note.   Briana Andrews is critically ill with multisystem organ failure.  Currently mildly hypotensive and encephalopathic with markedly elevated bilirubin, rising creatinine, ischemic legs s/p bilateral popliteal thrombectomies. Echo showed LV thrombus with EF 15-20% and severely decreased RV systolic function.   General:  Chronically ill-appearing Neck: JVP difficult, no thyromegaly or thyroid nodule.  Lungs: Decreased BS at bases.  CV: Lateral PMI.  Heart regular S1/S2, no S3/S4, no murmur.  No peripheral edema.   Abdomen: Soft, nontender, no hepatosplenomegaly, no distention.  Skin: Jaundice  Neurologic: Alert, confused.   Psych: Anxious. Extremities: No clubbing or cyanosis.  HEENT: Icteric.  1. Chronic systolic CHF/cardiogenic shock: Echo with EF 15-20%, mild LV dilation, LV thrombus, mild-moderate Briana, severely decreased RV systolic function.   Cause of cardiomyopathy is uncertain, but Briana Andrews was a heavy smoker and ECG is suggestive of possible old ASMI. Ischemic cardiomyopathy is possible, alternatively could be due to ETOH abuse (>6 pack/day per husband).  Lactate was elevated 4 days ago at admission.  Suspect cardiogenic  shock with low BP, cannot rule out component of septic shock. Volume status difficult by exam, CVL placed to follow CVP.  - CVL placed, follow CVP and co-ox.   - My suspicion is that CVP will be high, will start diuresis once BP/CO is stabilized.  - Will start dobutamine 2.5 mcg/kg/min now, may need norepinephrine.  - Eventually, Briana Andrews would ideally have right/left heart cath.  Need to follow for clinical stability and follow renal function.  - Would hold off on IABP/Impella given lack of good end point (not a candidate for advanced cardiac therapies).  - Continue digoxin.  2. Liver failure: Markedly elevated bilirubin with jaundice.  Liver imaging with US and CT has NOT shown cirrhosis or portal/hepatic vein thrombosis.  It is possible that this is due primarily to RV failure versus ETOH hepatitis.    3. Encephalopathy: ?Cause.  NH3 has not been markedly high but certainly could be hepatic encephalopathy.  Shock likely plays a role.  Briana Andrews is confused currently.  - Briana Andrews is on lactulose and rifaximin.  4. AKI: Creatinine up to 1.5 with rising BUN.  Suspect cardiorenal syndrome.  - Support BP.  5. LV thrombus: Possible cause of embolic event to popliteal arteries.  Briana Andrews is on heparin gtt, but Hgb trending down (9.2 most recently) with melena and FOBT+.  - Continue heparin gtt for now but may have to stop.  6. PAD: Bilateral popliteal artery occlusions, ?cardioembolic from LV thrombus.  Now s/p mechanical thrombectomy by vascular.  7. 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  Treatment will be anticoagulation.  8. Right popliteal vein DVT: On heparin gtt.  9. GI bleeding: Melena and FOBT+ today. Hgb 9.2.  Briana Andrews has multiple reasons to be anticoagulated.  - Would try to continue heparin gtt for now.  - Protonix gtt and octreotide gtt per GI.  10. ETOH abuse: ?If some of encephalopathy is related to withdrawal.   Discussion with husband today about the above.  Briana Andrews is now DNR.    Marca AnconaDalton Abhiraj Dozal 08/20/2019 4:55 PM

## 2019-08-20 NOTE — Consult Note (Signed)
`  NAME:  Briana Andrews, MRN:  [id number], DOB:  [birth year], LOS: 4 ADMISSION DATE:  08/15/2019, CONSULTATION DATE: 9/23 REFERRING MD:  Jearld Pies, CHIEF COMPLAINT:  Post-op shock   Brief History   53 yof admitted 9/19 w/ AMS and lethargy. 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 She was seen by multiple sub specialists including: Neuro, cardiology, vascular surgery and GI. She underwent subsequent mechanical thrombectomy of the lower extremities (the right on 9/22 and the left on 9/23). PCCM asked to see on 9/23 as she was hypotensive   History of present illness   53 year old white female admitted 9/19 w/ cc: confusion and weakness progressing acutely over about a weeks time.   In ER found to have: elevated LFTs, elevated LA, pcxr w/ multi-focal airspace disease concerning for PNA,  neuro was consulted after an MRI of brain showed subacute right frontal ACA territory infarct and small right caudate and cerebellar infarcts.  As part of the work up an echo was obtained. This showed: new CM EF 15-20%  W/ large fixed LV thrombus and was started on [facility] and cardiology was consulted. She was also seen by vascular surg w/ concern for ischemic LEs. US showed bilateral total popliteal artery occlusion w/ DVT on right.  GI was consulted for liver failure, heme + stool and dropping hgb.  She went to the OR on 9/22 for thrombectomy of the RLE and  9/23 for left LE thrombectomy of the left post tib and popliteal artery.  She returned to the med-surg floor hypotensive and more confused which was why PCCM was consulted.   Past Medical History  ETOH and tobacco abuse.   Significant Hospital Events   9/19 admitted. MRI  subacute right frontal ACA territory infarct and small right caudate and cerebellar infarcts.  As part of the work up an  echo was obtained. 9/20 ECHO showed: new CM EF 15-20%  W/ large fixed LV thrombus and was started on [facility] and cardiology was consulted. She was also seen by vascular surg w/ concern for ischemic LEs. US showed bilateral total popliteal artery occlusion w/ DVT on right.  GI was consulted for liver failure, heme + stool and dropping hgb.  9/22 for thrombectomy of the RLE   9/23 for left LE thrombectomy of the left post tib and popliteal artery. She returned to the med-surg floor hypotensive and more confused which was why PCCM was consulted. Also having melanotic stool w/ hgb drop so GI starting octreotide and ppi gtt.  Consults:  Neuro 9/19 Cardiology 9/19 Vascular surgey 9/19 GI   Procedures:   9/22 Mechanical thrombectomy of the right superficial femoral, popliteal, and posterior tibial artery using the penumbra CAT 6 device 9/23 Mechanical thrombectomy of the left popliteal and posterior tibial artery using the Penumbra CAT 6 device Significant Diagnostic Tests:  echo 9/19: Left Ventricle: Left ventricular ejection fraction, by visual estimation, is 20%. The left ventricle has severely decreased function. There is a large, fixed, apical left ventricular thrombus. Global RV systolic function is has severely reduced systolic function Mri brain 9/19: 1. Gyriform post ischemic appearing enhancement in the right ACA territory, corresponding to most confluent diffusion abnormality earlier today. 2. Faint linear enhancement in the left frontal lobe white matter appears to reflect an area of late subacute ischemia. CT  abd nml pancreas, no biliary dilation, no gallstones.  Micro Data:    Antimicrobials:  levaquin 9/21>>>  Interim history/subjective:  Mor confused now hypotensive   Objective   Blood pressure (Abnormal) 89/65, pulse 87, temperature 98.3 F (36.8 C), temperature source Oral, resp. rate 11, height 5\' 5"  (1.651 m), weight 81.2 kg, SpO2 100 %.        Intake/Output Summary  (Last 24 hours) at 08/20/2019 1429 Last data filed at 08/20/2019 1233 Gross per 24 hour  Intake 1652.73 ml  Output 1950 ml  Net -297.27 ml   Filed Weights   08/15/19 2345 08/20/19 0519  Weight: 81.6 kg 81.2 kg    Examination: General: Acutely ill 53 year old white female currently moaning and hollering out in bed seems uncomfortable and restless HENT: Sclera are icteric mucous membranes are dry neck veins flat Lungs: Scattered rhonchi some accessory use tachypneic Cardiovascular: Tachycardic regular rhythm Abdomen: Soft nontender Extremities: No significant edema Neuro: Confused moves all extremities GU: Voids  Resolved Hospital Problem list     Assessment & Plan:   Cardiogenic shock secondary to biventricular heart failure, likely further complicated by element of hypovolemia and acute GI bleeding Plan Obtain central access Obtain central venous pressure and co-oximetry Assess volume status to assure euvolemia Vasoactive drips and inotropes per heart failure team  Large left ventricular thrombus Plan IV anticoagulation  Acute metabolic encephalopathy in the setting of Shock, hepatic dysfunction, and recent subacute embolic CVA -Also at risk for alcohol withdrawal Plan Careful with narcotics Treat shock Avoid sedation Serial neuro checks Continue thiamine and folate Continue lactulose  Acute upper GI bleed with associated acute blood loss anemia.  -Hemoglobin has dropped from 11.9-9.2 in the last 24 hours followed by GI.  This is in the setting of newly diagnosed alcoholic hepatitis -Either gastritis or possible variceal bleed Plan Serial hemoglobins Starting octreotide and Protonix infusions Have discussed heparin with GI medicine, we will initiate this knowing bleeding risk  Dyspnea with bilateral pulmonary infiltrates This could reflect either pulmonary edema or also consider aspiration pneumonia Plan Continue supplemental oxygen Day #3 Levaquin   Bilateral lower extremity arterial occlusion now status post thrombectomy bilaterally Plan Serial vascular checks IV heparin Close monitoring and additional recommendations per vascular surgery  Acute kidney injury She has had several dye loads, also now in shock.  I am concerned this will progress.   Plan Maximize hemodynamics Renal dose medications Strict intake output  At risk for fluid and electrolyte imbalance: Hypomagnesemia Plan Continue to trend serial chemistries  Alcoholic hepatitis Plan Per GI  Mild coagulopathy secondary to shock and liver dysfunction Plan Serial INR Consider vitamin K if INR greater than 2   Best practice:  Diet: NPO Pain/Anxiety/Delirium protocol (if indicated): NA VAP protocol (if indicated): NA DVT prophylaxis: IV heparin  GI prophylaxis: PPI gtt  Glucose control: ssi  Mobility: BR Code Status: DNR Family Communication: Long discussion with husband.  He understands the challenges of requiring anticoagulation in the setting of active bleeding.  At this point our plan is to try to treat her through shock state.  Should she develop cardiopulmonary arrest we will not offer CPR Disposition: Critically ill  Labs   CBC: Recent Labs  Lab 08/15/19 2350 08/16/19 1249 08/17/19 0439 08/18/19 0407 08/19/19 0425 08/20/19 0141  WBC 9.7  --  12.0* 12.2* 15.6* 20.1*  NEUTROABS 7.7  --   --   --   --   --   HGB 16.9*  --  15.8*  14.5 11.9* 9.2*  HCT 48.6* 44.5 45.8 42.1 33.0* 27.7*  MCV 100.8*  --  101.6* 101.7* 98.8 103.7*  PLT 134*  --  106* 110* 107* 132*    Basic Metabolic Panel: Recent Labs  Lab 08/15/19 2350 08/17/19 0439 08/18/19 0407 08/19/19 0425 08/20/19 0141  NA 133* 130* 132* 134* 136  K 4.1 4.4 4.3 2.7* 3.7  CL 97* 100 100 100 105  CO2 21* 18* 19* 23 25  GLUCOSE 119* 135* 83 98 92  BUN 47* 41* 39* 47* 67*  CREATININE 1.64* 1.30* 1.48* 1.41* 1.51*  CALCIUM 8.7* 8.0* 7.9* 7.6* 7.5*  MG  --   --  1.9  --  1.6*  PHOS   --   --   --   --  3.5   GFR: Estimated Creatinine Clearance: 44.8 mL/min (A) (by C-G formula based on SCr of 1.51 mg/dL (H)). Recent Labs  Lab 08/15/19 2350 08/16/19 0400 08/17/19 0439 08/18/19 0407 08/19/19 0425 08/20/19 0141  WBC 9.7  --  12.0* 12.2* 15.6* 20.1*  LATICACIDVEN 2.8* 2.0*  --   --   --   --     Liver Function Tests: Recent Labs  Lab 08/15/19 2350 08/17/19 0439 08/18/19 0407 08/19/19 0425 08/20/19 0141  AST 59* 54* 69* 48* 44*  ALT 43 39 38 35 36  ALKPHOS 83 76 75 66 70  BILITOT 12.7* 12.1* 11.8* 9.3* 7.7*  PROT 7.7 6.5 5.9* 4.7* 4.7*  ALBUMIN 2.4* 1.9* 1.7* 1.3* 1.2*   Recent Labs  Lab 08/15/19 2350  LIPASE 49   Recent Labs  Lab 08/15/19 2350 08/17/19 0439 08/18/19 0407  AMMONIA 18 39* 42*    ABG No results found for: PHART, PCO2ART, PO2ART, HCO3, TCO2, ACIDBASEDEF, O2SAT   Coagulation Profile: Recent Labs  Lab 08/16/19 0016 08/17/19 0439 08/18/19 0407 08/19/19 0425 08/20/19 0141  INR 1.7* 1.9* 1.6* 1.6* 1.7*    Cardiac Enzymes: Recent Labs  Lab 08/16/19 1948  CKTOTAL 196    HbA1C: Hgb A1c MFr Bld  Date/Time Value Ref Range Status  08/17/2019 04:39 AM 5.8 (H) 4.8 - 5.6 % Final    Comment:    (NOTE) Pre diabetes:          5.7%-6.4% Diabetes:              >6.4% Glycemic control for   <7.0% adults with diabetes     CBG: Recent Labs  Lab 08/16/19 1028  GLUCAP 105*      Past Medical History  She,  has a past medical history of Alcohol abuse and Tobacco abuse.   Surgical History    Past Surgical History:  Procedure Laterality Date  . ABDOMINAL AORTOGRAM W/LOWER EXTREMITY N/A 08/19/2019   Procedure: ABDOMINAL AORTOGRAM W/LOWER EXTREMITY;  Surgeon: Nada LibmanBrabham, Vance W, MD;  Location: [facility] INVASIVE CV LAB;  Service: Cardiovascular;  Laterality: N/A;  . ABDOMINAL AORTOGRAM W/LOWER EXTREMITY Bilateral 08/20/2019   Procedure: ABDOMINAL AORTOGRAM W/LOWER EXTREMITY;  Surgeon: Cephus Shellinglark, Christopher J, MD;  Location: [facility] INVASIVE  CV LAB;  Service: Cardiovascular;  Laterality: Bilateral;  . PERIPHERAL VASCULAR BALLOON ANGIOPLASTY  08/19/2019   Procedure: PERIPHERAL VASCULAR BALLOON ANGIOPLASTY;  Surgeon: Nada LibmanBrabham, Vance W, MD;  Location: [facility] INVASIVE CV LAB;  Service: Cardiovascular;;  Right femoral popliteal and PT   . PERIPHERAL VASCULAR INTERVENTION Left 08/20/2019   Procedure: PERIPHERAL VASCULAR INTERVENTION;  Surgeon: Cephus Shellinglark, Christopher J, MD;  [facility];  Service: Cardiovascular;  Laterality: Left;     Social  History   reports that she has been smoking. She has never used smokeless tobacco. She reports current alcohol use. She reports current drug use. Drug: Marijuana.   Family History   Her family history includes Heart disease in her mother.   Allergies Allergies  Allergen Reactions  . Penicillins Other (See Comments)    Unsure of reaction Did it involve swelling of the face/tongue/throat, SOB, or low BP? Unknown Did it involve sudden or severe rash/hives, skin peeling, or any reaction on the inside of your mouth or nose? Unknown Did you need to seek medical attention at a hospital or doctor's office? Unknown When did it last happen?[place] If all above answers are "NO", may proceed with cephalosporin use.     Home Medications  Prior to Admission medications   Medication Sig Start Date End Date Taking? Authorizing Provider  ibuprofen (ADVIL) 200 MG tablet Take 200 mg by mouth every 6 (six) hours as needed for mild pain.   Yes [provider]  naproxen (NAPROSYN) 500 MG tablet Take 1 tablet (500 mg total) by mouth 2 (two) times daily with a meal. Patient not taking: Reported on 08/16/2019 08/04/19   Tommi Rumps, PA-C  traMADol (ULTRAM) 50 MG tablet Take 1 tablet (50 mg total) by mouth every 6 (six) hours as needed. Patient not taking: Reported on 08/16/2019 08/04/19   Tommi Rumps, PA-C     Critical care time: 65 minutes      Briana Andrews ACNP-BC [facility] Pager # [phone] OR # [phone] if no answer

## 2019-08-20 NOTE — Progress Notes (Signed)
ANTICOAGULATION CONSULT NOTE - Follow Up Consult  Pharmacy Consult for Heparin Indication: DVT, LV thrombus, and acute CVA  Allergies  Allergen Reactions  . Penicillins Other (See Comments)    Unsure of reaction Did it involve swelling of the face/tongue/throat, SOB, or low BP? Unknown Did it involve sudden or severe rash/hives, skin peeling, or any reaction on the inside of your mouth or nose? Unknown Did you need to seek medical attention at a hospital or doctor's office? Unknown When did it last happen?[place] If all above answers are "NO", may proceed with cephalosporin use.    Patient Measurements: Height: 5\' 5"  (165.1 cm) Weight: 179 lb 0.2 oz (81.2 kg) IBW/kg (Calculated) : 57 Heparin Dosing Weight: 75 kg  Vital Signs: Temp: 98.3 F (36.8 C) (09/23 1233) Temp Source: Oral (09/23 1233) BP: 89/65 (09/23 1328) Pulse Rate: 87 (09/23 1328)  Labs: Recent Labs    08/18/19 0407  08/18/19 2108 08/19/19 0425 08/20/19 0141  HGB 14.5  --   --  11.9* 9.2*  HCT 42.1  --   --  33.0* 27.7*  PLT 110*  --   --  107* 132*  LABPROT 19.2*  --   --  19.1* 20.0*  INR 1.6*  --   --  1.6* 1.7*  HEPARINUNFRC 0.16*   < > 0.31 0.51 0.44  CREATININE 1.48*  --   --  1.41* 1.51*   < > = values in this interval not displayed.    Estimated Creatinine Clearance: 44.8 mL/min (A) (by C-G formula based on SCr of 1.51 mg/dL (H)).   Medications:  Scheduled:  . Chlorhexidine Gluconate Cloth  6 each Topical Daily  . digoxin  0.125 mg Oral Daily  . folic acid  1 mg Oral Daily  . lactulose  10 g Oral BID  . levofloxacin  750 mg Oral Q48H  . multivitamin with minerals  1 tablet Oral Daily  . nicotine  21 mg Transdermal Daily  . potassium chloride  20 mEq Oral BID  . rifaximin  550 mg Oral BID  . sodium chloride flush  3 mL Intravenous Q12H  . sodium chloride flush  3 mL Intravenous Q12H   Infusions:  . sodium chloride    . sodium chloride    . sodium chloride    . DOBUTamine     . heparin 1,750 Units/hr (08/20/19 2979)  . magnesium sulfate bolus IVPB    . thiamine injection 500 mg (08/19/19 1741)    Assessment: [name] on heparin for LV thrombus and bilateral lower extremity ischemia w/ right popliteal vein DVT now s/p angioplasty and R/L popliteal thrombectomy of the tibial artery. Heparin therapy complicated by acute CVA and need for lower goal. Plans noted for coumadin when no further procedures  She is s/p mech thrombectomy of L popliteal today and heparin to restart 8 hours post sheath removal (removed ~ 12pm)   Goal of Therapy:  Heparin level 0.3-0.5 units/ml Monitor platelets by anticoagulation protocol: Yes   Plan:  -restart heparin at 1750 units/hr at 8pm -Heparin level in 8 hours and daily wth CBC daily -Will follow procedural plans  Hildred Laser, PharmD Clinical Pharmacist **Pharmacist phone directory can now be found on [email or web address] ([email or web address]).  Listed under [place].

## 2019-08-21 ENCOUNTER — Inpatient Hospital Stay (HOSPITAL_COMMUNITY): Payer: 59

## 2019-08-21 DIAGNOSIS — I739 Peripheral vascular disease, unspecified: Secondary | ICD-10-CM

## 2019-08-21 LAB — COMPREHENSIVE METABOLIC PANEL
ALT: 31 U/L (ref 0–44)
AST: 38 U/L (ref 15–41)
Albumin: 1.1 g/dL — ABNORMAL LOW (ref 3.5–5.0)
Alkaline Phosphatase: 66 U/L (ref 38–126)
Anion gap: 10 (ref 5–15)
BUN: 82 mg/dL — ABNORMAL HIGH (ref 6–20)
CO2: 19 mmol/L — ABNORMAL LOW (ref 22–32)
Calcium: 7.3 mg/dL — ABNORMAL LOW (ref 8.9–10.3)
Chloride: 109 mmol/L (ref 98–111)
Creatinine, Ser: 1.84 mg/dL — ABNORMAL HIGH (ref 0.44–1.00)
GFR calc Af Amer: 35 mL/min — ABNORMAL LOW (ref >60)
GFR calc non Af Amer: 31 mL/min — ABNORMAL LOW (ref >60)
Glucose, Bld: 119 mg/dL — ABNORMAL HIGH (ref 70–99)
Potassium: 3.7 mmol/L (ref 3.5–5.1)
Sodium: 138 mmol/L (ref 135–145)
Total Bilirubin: 6.2 mg/dL — ABNORMAL HIGH (ref 0.3–1.2)
Total Protein: 4.1 g/dL — ABNORMAL LOW (ref 6.5–8.1)

## 2019-08-21 LAB — CBC
HCT: 24.5 % — ABNORMAL LOW (ref 36.0–46.0)
Hemoglobin: 8.4 g/dL — ABNORMAL LOW (ref 12.0–15.0)
MCH: 32.4 pg (ref 26.0–34.0)
MCHC: 34.3 g/dL (ref 30.0–36.0)
MCV: 94.6 fL (ref 80.0–100.0)
Platelets: 120 10*3/uL — ABNORMAL LOW (ref 150–400)
RBC: 2.59 MIL/uL — ABNORMAL LOW (ref 3.87–5.11)
RDW: 19.7 % — ABNORMAL HIGH (ref 11.5–15.5)
WBC: 14.7 10*3/uL — ABNORMAL HIGH (ref 4.0–10.5)
nRBC: 0.3 % — ABNORMAL HIGH (ref 0.0–0.2)

## 2019-08-21 LAB — PROTHROMBIN GENE MUTATION

## 2019-08-21 LAB — COOXEMETRY PANEL
Carboxyhemoglobin: 1.9 % — ABNORMAL HIGH (ref 0.5–1.5)
Methemoglobin: 0.8 % (ref 0.0–1.5)
O2 Saturation: 72.9 %
Total hemoglobin: 8.5 g/dL — ABNORMAL LOW (ref 12.0–16.0)

## 2019-08-21 LAB — HEPARIN LEVEL (UNFRACTIONATED)
Heparin Unfractionated: 1.42 IU/mL — ABNORMAL HIGH (ref 0.30–0.70)
Heparin Unfractionated: 0.2 IU/mL — ABNORMAL LOW (ref 0.30–0.70)

## 2019-08-21 LAB — CULTURE, BLOOD (ROUTINE X 2)
Culture: NO GROWTH
Culture: NO GROWTH
Special Requests: ADEQUATE

## 2019-08-21 LAB — HEMOGLOBIN AND HEMATOCRIT, BLOOD
HCT: 21.7 % — ABNORMAL LOW (ref 36.0–46.0)
HCT: 21.9 % — ABNORMAL LOW (ref 36.0–46.0)
Hemoglobin: 7.8 g/dL — ABNORMAL LOW (ref 12.0–15.0)
Hemoglobin: 7.5 g/dL — ABNORMAL LOW (ref 12.0–15.0)

## 2019-08-21 MED ORDER — SODIUM CHLORIDE 0.9% FLUSH
10.0000 mL | Freq: Two times a day (BID) | INTRAVENOUS | Status: DC
  Administered 2019-08-21 – 2019-09-05 (×17): 10 mL via INTRAVENOUS

## 2019-08-21 MED ORDER — VITAMIN B-1 100 MG PO TABS
100.0000 mg | ORAL_TABLET | Freq: Every day | ORAL | Status: DC
  Administered 2019-08-21 – 2019-08-25 (×5): 100 mg via ORAL
  Filled 2019-08-21 (×5): qty 1

## 2019-08-21 MED ORDER — SODIUM CHLORIDE 0.9 % IV SOLN
INTRAVENOUS | Status: AC
  Administered 2019-08-21: 75 mL/h via INTRAVENOUS

## 2019-08-21 MED ORDER — HEPARIN (PORCINE) 25000 UT/250ML-% IV SOLN
1150.0000 [IU]/h | INTRAVENOUS | Status: DC
  Administered 2019-08-21: 14:00:00 1200 [IU]/h via INTRAVENOUS
  Administered 2019-08-22: 1300 [IU]/h via INTRAVENOUS
  Administered 2019-08-23 – 2019-08-24 (×2): 1200 [IU]/h via INTRAVENOUS
  Administered 2019-08-25 – 2019-08-26 (×3): 1250 [IU]/h via INTRAVENOUS
  Administered 2019-08-29: 14:00:00 1150 [IU]/h via INTRAVENOUS
  Filled 2019-08-21 (×10): qty 250

## 2019-08-21 MED ORDER — CHLORHEXIDINE GLUCONATE 0.12 % MT SOLN
15.0000 mL | Freq: Two times a day (BID) | OROMUCOSAL | Status: DC
  Administered 2019-08-21 – 2019-09-06 (×31): 15 mL via OROMUCOSAL
  Filled 2019-08-21 (×25): qty 15

## 2019-08-21 MED ORDER — SODIUM CHLORIDE 0.9% FLUSH
10.0000 mL | Freq: Two times a day (BID) | INTRAVENOUS | Status: DC
  Administered 2019-08-21 – 2019-09-05 (×19): 10 mL via INTRAVENOUS

## 2019-08-21 MED ORDER — SODIUM CHLORIDE 0.9% FLUSH: 10.0000 mL | INTRAVENOUS | Status: DC | PRN

## 2019-08-21 MED FILL — Heparin Sod (Porcine)-NaCl IV Soln 1000 Unit/500ML-0.9%: INTRAVENOUS | Qty: 1000 | Status: AC

## 2019-08-21 MED FILL — Nitroglycerin IV Soln 200 MCG/ML in D5W: INTRAVENOUS | Qty: 250 | Status: AC

## 2019-08-21 NOTE — Progress Notes (Signed)
Re-assessed patient's RLE to find that we are unable to doppler pedal pulses in that extremity.  Blistered area appears to be extending half-way up shin area.  Patient states she is in extreme pain.  Po pain medication given, but with little effect.  Critical care PA notified as well as vascular surgeon, Dr. Carlis Abbott, who is to be in to assess patient's status.  No cap refill in RLE  1227.  Now able to obtain right dorsalis pedal pulse by dopplar.

## 2019-08-21 NOTE — Progress Notes (Addendum)
Progress Note    08/21/2019 7:43 AM 1 Day Post-Op  Subjective:  Wants water; wants me to be careful removing blankets from feet  afebrile  Vitals:   08/21/19 0645 08/21/19 0700  BP: (!) 98/57 (!) 99/59  Pulse:    Resp: (!) 22 17  Temp:    SpO2: 95% 95%    Physical Exam: General:  Mildly distressed with removing covers from feet but doing ok. Lungs:  Non labored Incisions:  Bilateral groins are soft without hematoma Extremities:  Right foot wrapped; left DP/peroneal doppler signals   CBC    Component Value Date/Time   WBC 14.7 (H) 08/21/2019 0340   RBC 2.59 (L) 08/21/2019 0340   HGB 8.4 (L) 08/21/2019 0340   HCT 24.5 (L) 08/21/2019 0340   HCT 44.5 08/16/2019 1249   PLT 120 (L) 08/21/2019 0340   MCV 94.6 08/21/2019 0340   MCH 32.4 08/21/2019 0340   MCHC 34.3 08/21/2019 0340   RDW 19.7 (H) 08/21/2019 0340   LYMPHSABS 1.5 08/15/2019 2350   MONOABS 0.4 08/15/2019 2350   EOSABS 0.1 08/15/2019 2350   BASOSABS 0.0 08/15/2019 2350    BMET    Component Value Date/Time   NA 138 08/21/2019 0340   K 3.7 08/21/2019 0340   CL 109 08/21/2019 0340   CO2 19 (L) 08/21/2019 0340   GLUCOSE 119 (H) 08/21/2019 0340   BUN 82 (H) 08/21/2019 0340   CREATININE 1.84 (H) 08/21/2019 0340   CALCIUM 7.3 (L) 08/21/2019 0340   GFRNONAA 31 (L) 08/21/2019 0340   GFRAA 35 (L) 08/21/2019 0340    INR    Component Value Date/Time   INR 1.7 (H) 08/20/2019 0141     Intake/Output Summary (Last 24 hours) at 08/21/2019 0743 Last data filed at 08/21/2019 0600 Gross per 24 hour  Intake 1501.03 ml  Output 1450 ml  Net 51.03 ml     Assessment:  53 y.o. female is s/p:  1.  Ultrasound-guided access of the right common femoral artery 2.  Left lower extremity arteriogram with selection of third order branches 3.  Mechanical thrombectomy of the left popliteal and posterior tibial artery using the Penumbra CAT 6 device 4.  Intra-arterial administration of nitroglycerin 5.  Mynx closure of  the right common femoral artery 6.  54 minutes of monitored moderate conscious sedation time ([place])  1 Day Post-Op And 1.  Ultrasound-guided access, left femoral artery 2.  Abdominal aortogram 3.  Bilateral lower extremity runoff 4.  Mechanical thrombectomy of the right superficial femoral, popliteal, and posterior tibial artery using the penumbra CAT 6 device 5.  Angioplasty of the right superficial femoral-popliteal and posterior tibial artery 6.  Intra-arterial administration of nitroglycerin 7.  Closure device (Mynx) (Brabham) 2 Days Post Op  Plan: -pt with brisk doppler signals in the left foot.  Right PT is brisk at the ankle. Right foot about the same per Dr. Oneida Alar.  -discussed with RN and pt not on heparin gtt due to GIB.  Protonix and octreotide gtt started per GI.   -pt with new biventricular CHF with EF of 15-20% -continue daily dressing change to right foot with Xeroform and kerlix daily.  -pt seen by Palliative Medicine yesterday: SUMMARY OF RECOMMENDATIONS - Spouse wishes to continue current level of care without escalation for now- if patient declines further he would wish to transition to full comfort measures -If she fails to improve in the next 24-48 hours he would also consider de-escalating care and transitioning to full  comfort care    Doreatha Massed, PA-C [facility] [phone] 08/21/2019 7:43 AM    Pt left and right feet both well perfused at this point. Restart heparin when clinically reasonable in light of acute right leg DVT and LV thrombus with embolic to legs and brain May eventually need right BKA but would give this several weeks to declare itself. Nothing further to add at this point from vascular surgery standpoint Will arrange post op follow up in 2-4 weeks Will get new baseline ABIs today  Fabienne Bruns, MD [facility] [place] Office: [phone] Pager: [phone]

## 2019-08-21 NOTE — Progress Notes (Signed)
  Speech Language Pathology Treatment:    Patient Details Name: Briana Andrews MRN: [id number] DOB: 1966/06/04 Today's Date: 08/21/2019 Time: 4098-1191 SLP Time Calculation (min) (ACUTE ONLY): 20 min  Assessment / Plan / Recommendation Clinical Impression  Pt was seen for cognitive-linguistic treatment. Palliative Care's input from 08/20/19 is noted. Nursing reported that the pt's alertness was improved compared to this morning. She was alert and cooperative throughout the session. However, her overall cognition was worse compared to when she was last seen by this SLP on 08/18/19. This decline was most significantly observed in the areas of memory, reasoning, and attention during the session. Consistent cues were needed for attention (focused and sustained) and she was unable to complete many tasks for this reason. She was oriented to place and required mod to max cues during the session for reasoning. It is also noteworthy that the pt reported visualizing bats during the session and exhibited some difficulty with word retrieval during conversation compared to that which was noted during the last session. Pt's nurse, Shawn Route, was updated regarding the pt's presentation today compared to 08/18/19 and she verbalized understanding.    HPI HPI: 53 year old female with history of smoking, alcohol use, she drinks more than 6 packs of beer every day since last 20 years, no chronic medical issues brought to the emergency room with confusion more than usual and falls.  Since last few months there have been noticing some changes on her including weakness on her legs and poor appetite as much as she stopped smoking and decreased her drinking.  2 weeks ago, she was taken to [facility] with a fall, skeletal survey was negative except L1 fracture with 25% height loss and discharged home on muscle relaxants.  Patient has been intermittently confused since then. In the emergency room, she was found with  subacute right ACA stroke, jaundice with bilirubin of 12, bilateral multifocal pneumonia and metabolic encephalopathy.      SLP Plan  Continue with current plan of care       Recommendations                   Follow up Recommendations: Other (comment)(TBD based on pt's progress and goals of care) SLP Visit Diagnosis: Attention and concentration deficit Attention and concentration deficit following: Cerebral infarction Plan: Continue with current plan of care       Srihaan Mastrangelo I. Hardin Negus, [place], [place] Office number [phone] Pager [place] 08/21/2019, 5:27 PM

## 2019-08-21 NOTE — Progress Notes (Signed)
Inpatient Rehabilitation Admissions Coordinator  Noted medical plan as outlined. We will sign off at this time.  Danne Baxter, RN, MSN Rehab Admissions Coordinator [phone] 08/21/2019 8:32 AM

## 2019-08-21 NOTE — Progress Notes (Signed)
Called this afternoon with concern for loss of signals in the right lower extremity with increasing pain.  Went to bedside and the nurse had taken down the dressing and subsequently found a posterior tibial signal in the right foot.  On my exam indeed she has a brisk posterior tibial Doppler signal in the right foot.  She also has brisk dorsalis pedis and posterior tibial signals in the left foot.  Discussed with patient that she has a fair amount of desquamation of the right foot but her inflow has been optimized and will have to wait and see how this heals.   Marty Heck, MD [facility] [place] Office: [phone] Pager: [place]

## 2019-08-21 NOTE — Progress Notes (Signed)
Patient ID: Briana Andrews, female   DOB: 1966/04/27, 53 y.o.   MRN: [id number]     Advanced Heart Failure Rounding Note  PCP-Cardiologist: Ena Dawley, MD   Subjective:    Melena yesterday, she got 2 units PRBCs last night and hgb up to 8.4 this morning.  Off heparin gtt currently.  Per nursing, she did not have much melena overnight. Octreotide and Protonix gtts.   SBP 90s-100s, on dobutamine 2.5 with co-ox 73%, CVP < 5.  She is alert, confused.      Objective:   Weight Range: 80.3 kg Body mass index is 29.46 kg/m.   Vital Signs:   Temp:  [97.8 F (36.6 C)-98.5 F (36.9 C)] 98.3 F (36.8 C) (09/24 0400) Pulse Rate:  [81-102] 92 (09/24 0400) Resp:  [10-40] 17 (09/24 0700) BP: (75-109)/(50-74) 99/59 (09/24 0700) SpO2:  [0 %-100 %] 95 % (09/24 0700) Weight:  [80.3 kg] 80.3 kg (09/24 0431) Last BM Date: 08/20/19  Weight change: Filed Weights   08/15/19 2345 08/20/19 0519 08/21/19 0431  Weight: 81.6 kg 81.2 kg 80.3 kg    Intake/Output:   Intake/Output Summary (Last 24 hours) at 08/21/2019 0735 Last data filed at 08/21/2019 0600 Gross per 24 hour  Intake 1501.03 ml  Output 1450 ml  Net 51.03 ml      Physical Exam    General:  NAD HEENT: Icteric Neck: Supple. JVP not elevated. Carotids 2+ bilat; no bruits. No lymphadenopathy or thyromegaly appreciated. Cor: PMI nondisplaced. Regular rate & rhythm. No rubs, gallops or murmurs. Lungs: Decreased at bases.  Abdomen: Soft, nontender, nondistended. No hepatosplenomegaly. No bruits or masses. Good bowel sounds. Extremities: No cyanosis, clubbing, rash, edema Neuro: Alert but confused.  Skin: Jaundiced   Telemetry   NSR 90s  Labs    CBC Recent Labs    08/20/19 1740 08/21/19 0340  WBC 15.8* 14.7*  HGB 5.9* 8.4*  HCT 17.6* 24.5*  MCV 106.0* 94.6  PLT 120* 782*   Basic Metabolic Panel Recent Labs    08/20/19 0141 08/21/19 0340  NA 136 138  K 3.7 3.7  CL 105 109  CO2 25 19*  GLUCOSE 92 119*   BUN 67* 82*  CREATININE 1.51* 1.84*  CALCIUM 7.5* 7.3*  MG 1.6*  --   PHOS 3.5  --    Liver Function Tests Recent Labs    08/20/19 0141 08/21/19 0340  AST 44* 38  ALT 36 31  ALKPHOS 70 66  BILITOT 7.7* 6.2*  PROT 4.7* 4.1*  ALBUMIN 1.2* 1.1*   No results for input(s): LIPASE, AMYLASE in the last 72 hours. Cardiac Enzymes No results for input(s): CKTOTAL, CKMB, CKMBINDEX, TROPONINI in the last 72 hours.  BNP: BNP (last 3 results) No results for input(s): BNP in the last 8760 hours.  ProBNP (last 3 results) No results for input(s): PROBNP in the last 8760 hours.   D-Dimer No results for input(s): DDIMER in the last 72 hours. Hemoglobin A1C No results for input(s): HGBA1C in the last 72 hours. Fasting Lipid Panel No results for input(s): CHOL, HDL, LDLCALC, TRIG, CHOLHDL, LDLDIRECT in the last 72 hours. Thyroid Function Tests No results for input(s): TSH, T4TOTAL, T3FREE, THYROIDAB in the last 72 hours.  Invalid input(s): FREET3  Other results:   Imaging    Dg Chest Port 1 View  Result Date: 08/20/2019 CLINICAL DATA:  Reason for exam: Encounter for central line placement. EXAM: PORTABLE CHEST 1 VIEW COMPARISON:  Chest radiograph 08/16/2019 FINDINGS: Stable cardiomediastinal contours  with enlarged heart size. Interval placement of a right central venous catheter with tip projecting over the cavoatrial junction. New moderate-sized right pleural effusion. Bilateral opacities in the lung bases may represent a combination of infection and or atelectasis. No pneumothorax. No acute finding in the visualized skeleton. IMPRESSION: 1. Interval placement of right central venous catheter with tip projecting over the cavoatrial junction. No pneumothorax. 2. Moderate size right pleural effusion. 3. Bibasilar opacities may represent a combination of infection and/or atelectasis. Electronically Signed   By: Emmaline Kluver M.D.   On: 08/20/2019 16:39   [place] Ekg Site Rite  Result  Date: 08/20/2019 If Site Rite image not attached, placement could not be confirmed due to current cardiac rhythm.     Medications:     Scheduled Medications: . sodium chloride   Intravenous Once  . Chlorhexidine Gluconate Cloth  6 each Topical Daily  . digoxin  0.125 mg Oral Daily  . folic acid  1 mg Oral Daily  . lactulose  10 g Oral BID  . multivitamin with minerals  1 tablet Oral Daily  . nicotine  21 mg Transdermal Daily  . [START ON 08/24/2019] pantoprazole  40 mg Intravenous Q12H  . potassium chloride  20 mEq Oral BID  . rifaximin  550 mg Oral BID  . sodium chloride flush  3 mL Intravenous Q12H  . sodium chloride flush  3 mL Intravenous Q12H     Infusions: . sodium chloride    . sodium chloride Stopped (08/20/19 1943)  . cefTRIAXone (ROCEPHIN)  IV Stopped (08/20/19 2013)  . DOBUTamine 2.5 mcg/kg/min (08/21/19 0600)  . octreotide  (SANDOSTATIN)    IV infusion 50 mcg/hr (08/21/19 0600)  . pantoprozole (PROTONIX) infusion 8 mg/hr (08/21/19 0600)     PRN Medications:  sodium chloride, acetaminophen, albuterol, dextromethorphan-guaiFENesin, hydrALAZINE, labetalol, ondansetron (ZOFRAN) IV, ondansetron **OR** [DISCONTINUED] ondansetron (ZOFRAN) IV, oxyCODONE, sodium chloride flush, sodium chloride flush, sodium chloride flush   Assessment/Plan   1. Acute on chronic systolic CHF/cardiogenic shock: Echo with EF 15-20%, mild LV dilation, LV thrombus, mild-moderate MR, severely decreased RV systolic function.   Cause of cardiomyopathy is uncertain, but she was a heavy smoker and ECG is suggestive of possible old ASMI. Ischemic cardiomyopathy is possible, alternatively could be due to ETOH abuse (>6 pack/day per husband).  Lactate was elevated 4 days ago at admission.  Suspect cardiogenic shock with low BP, cannot rule out component of septic shock. Currently on dobutamine 2.5 with good co-ox (48% predobutamine, 73% this morning) and stable MAP.  CVP < 5.   - Continue dobutamine at  2.5.  - Gentle IV fluid 75 cc/hr x 500 cc this morning.   - Eventually, she would ideally have right/left heart cath.  Would hold off today with GI bleeding and higher creatinine. - Would hold off on mechanical support given lack of good end point (not a candidate for advanced cardiac therapies).  - Continue digoxin.  2. Liver failure: Markedly elevated bilirubin with jaundice.  Liver imaging with [place] and CT has NOT shown cirrhosis or portal/hepatic vein thrombosis.  It is possible that this is due primarily to RV failure versus ETOH hepatitis.    3. Encephalopathy: ?Cause.  NH3 has not been markedly high but certainly could be hepatic encephalopathy.  Shock likely plays a role.  She is confused currently but seems a bit more alert than yesterday.  - She is on lactulose and rifaximin.  4. AKI: Creatinine up to 1.8 with rising BUN.  Suspect cardiorenal syndrome, also had contrast with LE angiograms so possible component of contrast nephropathy.  - Support BP and cardiac output.  5. LV thrombus: Possible cause of embolic event to popliteal arteries.  Heparin gtt held with active GI bleeding. - Repeat CBC at noon.  If stable and no more active melena, may restart at low therapeutic range.  6. PAD: Bilateral popliteal artery occlusions, ?cardioembolic from LV thrombus.  Now s/p mechanical thrombectomy by vascular.  7. 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  Treatment will be anticoagulation.  8. Right popliteal vein DVT: Heparin gtt on hold as above.  9. GI bleeding: Melena and FOBT+ yesterday.  Hgb down to 5.9, 2 unit transfusion 9/23 and now 8.4. Minimal melena overnight.  She has multiple reasons to be anticoagulated unfortunately.  - Protonix gtt and octreotide gtt per GI.  - Repeat CBC at noon.  If stable and no melena, ideally would go back on at least low therapeutic range heparin.  10. ETOH abuse: Per husband, at least a 6 pack daily.  11. ID: Concern for aspiration  PNA. - Continue ceftriaxone.  12. DNR  CRITICAL CARE Performed by: Marca Ancona  Total critical care time: 40 minutes  Critical care time was exclusive of separately billable procedures and treating other patients.  Critical care was necessary to treat or prevent imminent or life-threatening deterioration.  Critical care was time spent personally by me on the following activities: development of treatment plan with patient and/or surrogate as well as nursing, discussions with consultants, evaluation of patient's response to treatment, examination of patient, obtaining history from patient or surrogate, ordering and performing treatments and interventions, ordering and review of laboratory studies, ordering and review of radiographic studies, pulse oximetry and re-evaluation of patient's condition.    Length of Stay: 5  Marca Ancona, MD  08/21/2019, 7:35 AM  Advanced Heart Failure Team Pager [phone] (M-F; 7a - 4p)  Please contact [facility] for night-coverage after hours (4p -7a ) and weekends on [email or web address]

## 2019-08-21 NOTE — Progress Notes (Signed)
[facility] Progress Note  Briana Andrews 53 y.o. [birth year]  CC:  alcoholic hepatitis with encephalopathy, LV thrombus, stroke   Subjective: Appears more alert today.  No significant melanotic stool in the rectal collection system.  ROS : Complaining of back pain and leg pain.  Negative for acute chest pain.   Objective: Vital signs in last 24 hours: Vitals:   08/21/19 1400 08/21/19 1500  BP:    Pulse:    Resp: (!) 23   Temp:  97.9 F (36.6 C)  SpO2: 99%     Physical Exam:  General:  Alert,no distress, appears stated age  Head:  Normocephalic, without obvious abnormality, atraumatic  Eyes:   Scleral icterus noted  Lungs:    Bibasilar rales.  Anterior exam only  Heart:  Regular rate and rhythm, S1, S2 normal  Abdomen:    Abdomen mildly distended, mild epigastric discomfort on palpation, bowel sounds present.  No peritoneal sign  Neuro.  alert and oriented       Lab Results: Recent Labs    08/20/19 0141 08/21/19 0340  NA 136 138  K 3.7 3.7  CL 105 109  CO2 25 19*  GLUCOSE 92 119*  BUN 67* 82*  CREATININE 1.51* 1.84*  CALCIUM 7.5* 7.3*  MG 1.6*  --   PHOS 3.5  --    Recent Labs    08/20/19 0141 08/21/19 0340  AST 44* 38  ALT 36 31  ALKPHOS 70 66  BILITOT 7.7* 6.2*  PROT 4.7* 4.1*  ALBUMIN 1.2* 1.1*   Recent Labs    08/20/19 1740 08/21/19 0340 08/21/19 1200  WBC 15.8* 14.7*  --   HGB 5.9* 8.4* 7.8*  HCT 17.6* 24.5* 21.7*  MCV 106.0* 94.6  --   PLT 120* 120*  --    Recent Labs    08/19/19 0425 08/20/19 0141  LABPROT 19.1* 20.0*  INR 1.6* 1.7*      Assessment/Plan: -Jaundice.  Significantly elevated T bili at 12.7 with mild elevated AST at 59.  Normal ALT and alkaline phosphatase.  CT scan showed normal-appearing liver and pancreas.  No biliary dilatation.  No gallstones or gall bladder wall thickening.  Most likely from alcoholic hepatitis. -Altered mental status.  MRI concerning for recent stroke. -Newly diagnosed  biventricular CHF with EF of only 15 to 20% with large fixed thrombus on apical septum. -Shock.  Combined cardiogenic and septic. -Lower limb ischemia.  Status post thrombectomy. -Melena with acute blood loss anemia.  Recommendations ---------------------------- -Continue Protonix drip and octreotide drip.  -If ongoing melena, may consider EGD for further evaluation but patient remains at high risk for perioperative/postoperative complications given underlying multiple medical comorbidities.    -Very poor prognosis   Otis Brace MD, FACP 08/21/2019, 4:09 PM  Contact #  [phone]

## 2019-08-21 NOTE — Progress Notes (Addendum)
`  NAME:  Briana Andrews, MRN:  [id number], DOB:  1966-05-29, LOS: 5 ADMISSION DATE:  08/15/2019, CONSULTATION DATE: 9/23 REFERRING MD:  Jearld Pies, CHIEF COMPLAINT:  Post-op shock   Brief History   53 yof admitted 9/19 w/ AMS and lethargy. 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 She was seen by multiple sub specialists including: Neuro, cardiology, vascular surgery and GI. She underwent subsequent mechanical thrombectomy of the lower extremities (the right on 9/22 and the left on 9/23). PCCM asked to see on 9/23 as she was hypotensive   History of present illness   53 year old white female admitted 9/19 w/ cc: confusion and weakness progressing acutely over about a weeks time.   In ER found to have: elevated LFTs, elevated LA, pcxr w/ multi-focal airspace disease concerning for PNA,  neuro was consulted after an MRI of brain showed subacute right frontal ACA territory infarct and small right caudate and cerebellar infarcts.  As part of the work up an echo was obtained. This showed: new CM EF 15-20%  W/ large fixed LV thrombus and was started on [facility] and cardiology was consulted. She was also seen by vascular surg w/ concern for ischemic LEs. US showed bilateral total popliteal artery occlusion w/ DVT on right.  GI was consulted for liver failure, heme + stool and dropping hgb.  She went to the OR on 9/22 for thrombectomy of the RLE and  9/23 for left LE thrombectomy of the left post tib and popliteal artery.  She returned to the med-surg floor hypotensive and more confused which was why PCCM was consulted.   Past Medical History  ETOH and tobacco abuse.   Significant Hospital Events   9/19 admitted. MRI  subacute right frontal ACA territory infarct and small right caudate and cerebellar infarcts.  As part of the work up an  echo was obtained. 9/20 ECHO showed: new CM EF 15-20%  W/ large fixed LV thrombus and was started on [facility] and cardiology was consulted. She was also seen by vascular surg w/ concern for ischemic LEs. US showed bilateral total popliteal artery occlusion w/ DVT on right.  GI was consulted for liver failure, heme + stool and dropping hgb.  9/22 for thrombectomy of the RLE   9/23 for left LE thrombectomy of the left post tib and popliteal artery. She returned to the med-surg floor hypotensive and more confused which was why PCCM was consulted. Also having melanotic stool w/ hgb drop so GI starting octreotide and ppi gtt. Transferred to ICU, DNR order placed. Palliative consulted-- no escalation of care  9/24 Consults:  Neuro 9/19 Cardiology 9/19 Vascular surgey 9/19 GI  PCCM 9/23 Palliative Care Medicine 9/23   Procedures:  9/22 Mechanical thrombectomy of the right superficial femoral, popliteal, and posterior tibial artery using the penumbra CAT 6 device 9/23 Mechanical thrombectomy of the left popliteal and posterior tibial artery using the Penumbra CAT 6 device Significant Diagnostic Tests:  Echo 9/19: Left Ventricle: Left ventricular ejection fraction, by visual estimation, is 20%. The left ventricle has severely decreased function. There is a large, fixed, apical left ventricular thrombus. Global RV systolic function is has severely reduced systolic function Mri brain 9/19: 1. Gyriform post ischemic appearing enhancement in the right ACA territory, corresponding to most confluent diffusion abnormality earlier today. 2. Faint  linear enhancement in the left frontal lobe white matter appears to reflect an area of late subacute ischemia. CT abd nml pancreas, no biliary dilation, no gallstones.  Micro Data:    Antimicrobials:  levaquin 9/21>>> Ceftriaxone 9/23>>  Interim history/subjective:  Improved mentation this morning Resting in bed, NAD Asking for water  Objective   Blood  pressure (!) 99/59, pulse 92, temperature 98.3 F (36.8 C), temperature source Axillary, resp. rate 17, height 5\' 5"  (1.651 m), weight 80.3 kg, SpO2 95 %. CVP:  [5 mmHg-8 mmHg] 7 mmHg      Intake/Output Summary (Last 24 hours) at 08/21/2019 0744 Last data filed at 08/21/2019 0600 Gross per 24 hour  Intake 1501.03 ml  Output 1450 ml  Net 51.03 ml   Filed Weights   08/15/19 2345 08/20/19 0519 08/21/19 0431  Weight: 81.6 kg 81.2 kg 80.3 kg    Examination: General: Acute and chronically ill appearing adult F. Reclined in bed, NAD HENT: NCAT. Icteric sclera. Pink, tacky mucus membranes. Patent nares with [place] in place Lungs: Diminished bibasilar sounds. Scattered bilateral rhonchi. Symmetrical chest expansion. No accessory muscle use  Cardiovascular: RRR s1s2. Capillary refill <3 seconds BUE Abdomen: soft, round, ndnt.  Extremities: Trace BUE dependent edema. R foot wrapped.  Neuro: Awake, alert, calm. Oriented x2.  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  -EF 15-20% Confounding component of hypovolemic shock 2/2 acute GI bleed Confounding component of possible septic shock  Plan -Vasoactive medications and inotropes per heart failure (dobutamine, digoxin) -Trend CVP, coox -Per Advanced heart failure, not a candidate for advanced cardiac therapies. If recovers from critical illness, will need consideration of R/L heart cath -Ceftriaxone for septic component  -Trend WBC, fever curve  -Goal euvolemia. Transfuse as indicated, as below. If hypovolemic and no indication for blood product, balanced IVF resuscitation  Large LV thrombus Plan Off heparin currently in setting of interval blood loss s/p 2 PRBC.  If hgb/hct stable at noon, may restart IV heparin at low therapeutic range  Risks of anticoagulation in setting of active bleeding discussed and understood by  husband   Acute metabolic encephalopathy 2/2 shock, hepatic dysfunction, recent subacute CVA -At risk for EtOH withdrawal  Plan Continue lactulose for hepatic component Minimize sedating medications MAP goal > 65, treat shock components as above  Delirium RN prevention bundle  Acute upper GI bleed Acute blood loss anemia -Hemoglobin has dropped from 11.9-9.2 in the last 24 hours followed by GI.  This is in the setting of newly diagnosed alcoholic hepatitis -Either gastritis or possible variceal bleed Plan -GI following -Heparin initiated yesterday, now held due to interval blood loss -Serial H/H -Transfuse as indicated -Continue octreotide, protonix  Bilateral pulmonary opacities -PNA vs edema vs atelectasis Plan -DNR/DNI -Supplemental O2 for SpO2 goal > 92% -Pulm hygiene -Continue abx   BLE arterial occlusion, s/p bilateral thrombectomy Plan -Continue serial vascular checks -IV heparin currently held as above -Additional recs per vascular surgery  AKI -likely multifactorial in setting of shock, contrast medium -at risk for progression of renal injury  Plan -MAP goal > 65 as per shock plans -Trend renal function, UOP -appreciate pharmacy assistance with dosing   Malnutrition in setting of EtOH use disorder At risk fluid and electrolyte imbalance Plan -Trend BMP mag phos replace as indicated -Continue B vitamins, Multivitamin   Alcoholic hepatitis  Plan -Per GI -Continue  Rifaximin   Coagulopathy, mild, in setting of shock and liver dysfunction Plan -Serial INR -Consider vitamin K if INR greater than 2  Hyperglycemia Plan -SSI   Goals of Care:  Per PCCM 9/23> "Long discussion with husband.  He understands the challenges of requiring anticoagulation in the setting of active bleeding.  At this point our plan is to try to treat her through shock state.  Should she develop cardiopulmonary arrest we will not offer CPR"  Per Palliative Care 9/23> "Spouse  wishes to continue current level of care without escalation for now- if patient declines further he would wish to transition to full comfort measures. If she fails to improve in the next 24-48 hours he would also consider de-escalating care and transitioning to full comfort care" Plan -DNR/DNI -Continue current level of care without escalation at present -ICU goal is to support through acute critical illness course -Remains tenuous-- Some improvement 9/24 AM vs 9/23 PM.  If declines of if fails to improve over next day or two, likely de-escalation of care and transition to comfort. Will continue close communication with husband regarding these decisions.  -Appreciate palliative care medicine assistance    Best practice:  Diet: NPO Pain/Anxiety/Delirium protocol (if indicated): NA VAP protocol (if indicated): NA DVT prophylaxis: holding heparin gtt at this time  GI prophylaxis: PPI gtt  Glucose control: SSI  Mobility: BR Code Status: DNR Family Communication: Pending 9/24 Disposition: ICU. Remains critically ill and at risk for further acute decompensation.   Labs   CBC: Recent Labs  Lab 08/15/19 2350  08/18/19 0407 08/19/19 0425 08/20/19 0141 08/20/19 1740 08/21/19 0340  WBC 9.7   < > 12.2* 15.6* 20.1* 15.8* 14.7*  NEUTROABS 7.7  --   --   --   --   --   --   HGB 16.9*   < > 14.5 11.9* 9.2* 5.9* 8.4*  HCT 48.6*   < > 42.1 33.0* 27.7* 17.6* 24.5*  MCV 100.8*   < > 101.7* 98.8 103.7* 106.0* 94.6  PLT 134*   < > 110* 107* 132* 120* 120*   < > = values in this interval not displayed.    Basic Metabolic Panel: Recent Labs  Lab 08/17/19 0439 08/18/19 0407 08/19/19 0425 08/20/19 0141 08/21/19 0340  NA 130* 132* 134* 136 138  K 4.4 4.3 2.7* 3.7 3.7  CL 100 100 100 105 109  CO2 18* 19* 23 25 19*  GLUCOSE 135* 83 98 92 119*  BUN 41* 39* 47* 67* 82*  CREATININE 1.30* 1.48* 1.41* 1.51* 1.84*  CALCIUM 8.0* 7.9* 7.6* 7.5* 7.3*  MG  --  1.9  --  1.6*  --   PHOS  --   --   --   3.5  --    GFR: Estimated Creatinine Clearance: 36.6 mL/min (A) (by C-G formula based on SCr of 1.84 mg/dL (H)). Recent Labs  Lab 08/15/19 2350 08/16/19 0400  08/19/19 0425 08/20/19 0141 08/20/19 1740 08/21/19 0340  WBC 9.7  --    < > 15.6* 20.1* 15.8* 14.7*  LATICACIDVEN 2.8* 2.0*  --   --   --   --   --    < > = values in this interval not displayed.    Liver Function Tests: Recent Labs  Lab 08/17/19 0439 08/18/19 0407 08/19/19 0425 08/20/19 0141 08/21/19 0340  AST 54* 69* 48* 44* 38  ALT 39 38 35 36 31  ALKPHOS 76 75 66 70 66  BILITOT 12.1*  11.8* 9.3* 7.7* 6.2*  PROT 6.5 5.9* 4.7* 4.7* 4.1*  ALBUMIN 1.9* 1.7* 1.3* 1.2* 1.1*   Recent Labs  Lab 08/15/19 2350  LIPASE 49   Recent Labs  Lab 08/15/19 2350 08/17/19 0439 08/18/19 0407  AMMONIA 18 39* 42*    ABG    Component Value Date/Time   O2SAT 72.9 08/21/2019 0345     Coagulation Profile: Recent Labs  Lab 08/16/19 0016 08/17/19 0439 08/18/19 0407 08/19/19 0425 08/20/19 0141  INR 1.7* 1.9* 1.6* 1.6* 1.7*    Cardiac Enzymes: Recent Labs  Lab 08/16/19 1948  CKTOTAL 196    HbA1C: Hgb A1c MFr Bld  Date/Time Value Ref Range Status  08/17/2019 04:39 AM 5.8 (H) 4.8 - 5.6 % Final    Comment:    (NOTE) Pre diabetes:          5.7%-6.4% Diabetes:              >6.4% Glycemic control for   <7.0% adults with diabetes     CBG: Recent Labs  Lab 08/16/19 1028  GLUCAP 105*       CRITICAL CARE Performed by: Cristal Generous   Total critical care time: 45 minutes  Critical care time was exclusive of separately billable procedures and treating other patients. Critical care was necessary to treat or prevent imminent or life-threatening deterioration.  Critical care was time spent personally by me on the following activities: development of treatment plan with patient and/or surrogate as well as nursing, discussions with consultants, evaluation of patient's response to treatment, examination  of patient, obtaining history from patient or surrogate, ordering and performing treatments and interventions, ordering and review of laboratory studies, ordering and review of radiographic studies, pulse oximetry and re-evaluation of patient's condition.  Eliseo Gum MSN, AGACNP-BC [place] [phone] If no answer, [phone] 08/21/2019, 7:44 AM  Attending Note:  53 year old female with PMH of liver disease and LV clots presenting to PCCM with UGI bleed and cardiogenic shock followed by heart failure and vascular.  On exam, she is alert and interactive but remains on pressors with clear lungs.  I reviewed CXR myself, ETT is in a good position.  Discussed with PCCM-NP.  Will continue pressor support.  NPO.  GI, vascular and heart failure following and appreciate input.  DNR status noted.  PCCM will continue to follow while on pressors.  The patient is critically ill with multiple organ systems failure and requires high complexity decision making for assessment and support, frequent evaluation and titration of therapies, application of advanced monitoring technologies and extensive interpretation of multiple databases.   Critical Care Time devoted to patient care services described in this note is  32  Minutes. This time reflects time of care of this signee Dr Jennet Maduro. This critical care time does not reflect procedure time, or teaching time or supervisory time of PA/NP/Med student/Med Resident etc but could involve care discussion time.  Rush Farmer, M.D. [facility] Pulmonary/Critical Care Medicine. Pager: [phone]. After hours pager: [phone].

## 2019-08-21 NOTE — Progress Notes (Signed)
ABI's have been completed. Preliminary results can be found in CV Proc through chart review.   08/21/19 11:48 AM Briana Andrews RVT

## 2019-08-21 NOTE — Progress Notes (Signed)
ANTICOAGULATION CONSULT NOTE - Follow Up Consult  Pharmacy Consult for Heparin Indication: DVT, LV thrombus, and acute CVA  Allergies  Allergen Reactions  . Penicillins Other (See Comments)    Unsure of reaction Did it involve swelling of the face/tongue/throat, SOB, or low BP? Unknown Did it involve sudden or severe rash/hives, skin peeling, or any reaction on the inside of your mouth or nose? Unknown Did you need to seek medical attention at a hospital or doctor's office? Unknown When did it last happen?[place] If all above answers are "NO", may proceed with cephalosporin use.    Patient Measurements: Height: 5\' 5"  (165.1 cm) Weight: 177 lb 0.5 oz (80.3 kg) IBW/kg (Calculated) : 57 Heparin Dosing Weight: 75 kg  Vital Signs: Temp: 97.9 F (36.6 C) (09/24 1132) Temp Source: Oral (09/24 1132) BP: 124/47 (09/24 1300) Pulse Rate: 92 (09/24 1000)  Labs: Recent Labs    08/18/19 2108  08/19/19 0425 08/20/19 0141 08/20/19 1740 08/21/19 0340 08/21/19 1200  HGB  --    < > 11.9* 9.2* 5.9* 8.4* 7.8*  HCT  --    < > 33.0* 27.7* 17.6* 24.5* 21.7*  PLT  --    < > 107* 132* 120* 120*  --   LABPROT  --   --  19.1* 20.0*  --   --   --   INR  --   --  1.6* 1.7*  --   --   --   HEPARINUNFRC 0.31  --  0.51 0.44  --   --   --   CREATININE  --   --  1.41* 1.51*  --  1.84*  --    < > = values in this interval not displayed.    Estimated Creatinine Clearance: 36.6 mL/min (A) (by C-G formula based on SCr of 1.84 mg/dL (H)).   Medications:  Scheduled:  . chlorhexidine  15 mL Mouth/Throat BID  . Chlorhexidine Gluconate Cloth  6 each Topical Daily  . digoxin  0.125 mg Oral Daily  . folic acid  1 mg Oral Daily  . lactulose  10 g Oral BID  . multivitamin with minerals  1 tablet Oral Daily  . nicotine  21 mg Transdermal Daily  . [START ON 08/24/2019] pantoprazole  40 mg Intravenous Q12H  . potassium chloride  20 mEq Oral BID  . rifaximin  550 mg Oral BID  . sodium chloride  flush  10 mL Intravenous Q12H  . sodium chloride flush  10 mL Intravenous Q12H  . thiamine  100 mg Oral Daily   Infusions:  . sodium chloride Stopped (08/20/19 1943)  . cefTRIAXone (ROCEPHIN)  IV Stopped (08/20/19 2013)  . DOBUTamine 2.5 mcg/kg/min (08/21/19 0700)  . heparin 1,200 Units/hr (08/21/19 1352)  . octreotide  (SANDOSTATIN)    IV infusion 50 mcg/hr (08/21/19 1415)  . pantoprozole (PROTONIX) infusion 8 mg/hr (08/21/19 0700)    Assessment: [name] on heparin for LV thrombus and bilateral lower extremity ischemia w/ right popliteal vein DVT now s/p angioplasty and R/L popliteal thrombectomy of the tibial artery. Heparin therapy complicated by acute CVA and recent GI bleed and need for lower goal. Plans noted for coumadin when no further procedures  She is s/p mech thrombectomy of L popliteal 9/23. Heparin stopped 09/23 @ 1750 due to a GI bleed. Pt was started on octreotide and IV protonix. GI bleeding does not appear to be worse with black tarry stools resolving slowly. Hg yesterday was 5.9, but has now stabilized  to 7.8 after 1 unit of PRBC. Patient was previously therapeutic on heparin rate of 1,750 units/hr. Will not start at this rate due to recent GI bleed. Plts are low but stable.   Goal of Therapy:  Heparin level 0.3-0.5 units/ml Monitor platelets by anticoagulation protocol: Yes   Plan:  - Restart heparin at 1200 units/hr. Starting low dose due to recent GI bleed - Recheck Heparin level in 6 hours. - Monitor daily CBC and heparin level - Watch for signs/symptoms of bleeding - Will follow procedural plans  Sherren Kerns, PharmD PGY1 Acute Care Pharmacy Resident **Pharmacist phone directory can now be found on [email or web address] ([email or web address]).  Listed under [place].

## 2019-08-21 NOTE — Progress Notes (Signed)
PT Cancellation Note  Patient Details Name: Taylee Gunnells MRN: [id number] DOB: [birth year]   Cancelled Treatment:    Reason Eval/Treat Not Completed: Medical issues which prohibited therapy. Medical decline requiring transfer to ICU 08/20/19. Bedrest per critical care MD progress note. Possible move to comfort care if no medical improvement in 1-2 days. PT to follow. Please advise when/if appropriate to resume PT.   Lorriane Shire 08/21/2019, 11:09 AM   Lorrin Goodell, PT  Office # [phone] Pager [phone]

## 2019-08-21 NOTE — Progress Notes (Signed)
ANTICOAGULATION CONSULT NOTE - Follow Up Consult  Pharmacy Consult for Heparin Indication: DVT, LV thrombus, and acute CVA  Allergies  Allergen Reactions  . Penicillins Other (See Comments)    Unsure of reaction Did it involve swelling of the face/tongue/throat, SOB, or low BP? Unknown Did it involve sudden or severe rash/hives, skin peeling, or any reaction on the inside of your mouth or nose? Unknown Did you need to seek medical attention at a hospital or doctor's office? Unknown When did it last happen?[place] If all above answers are "NO", may proceed with cephalosporin use.    Patient Measurements: Height: 5\' 5"  (165.1 cm) Weight: 177 lb 0.5 oz (80.3 kg) IBW/kg (Calculated) : 57 Heparin Dosing Weight: 75 kg  Vital Signs: Temp: 97.5 F (36.4 C) (09/24 2000) Temp Source: Oral (09/24 2000) BP: 114/42 (09/24 2215) Pulse Rate: 90 (09/24 2000)  Labs: Recent Labs    08/19/19 0425 08/20/19 0141 08/20/19 1740 08/21/19 0340 08/21/19 1200 08/21/19 1939 08/21/19 2212  HGB 11.9* 9.2* 5.9* 8.4* 7.8* 7.5*  --   HCT 33.0* 27.7* 17.6* 24.5* 21.7* 21.9*  --   PLT 107* 132* 120* 120*  --   --   --   LABPROT 19.1* 20.0*  --   --   --   --   --   INR 1.6* 1.7*  --   --   --   --   --   HEPARINUNFRC 0.51 0.44  --   --   --  1.42* 0.20*  CREATININE 1.41* 1.51*  --  1.84*  --   --   --     Estimated Creatinine Clearance: 36.6 mL/min (A) (by C-G formula based on SCr of 1.84 mg/dL (H)).   Medications:  Scheduled:  . chlorhexidine  15 mL Mouth/Throat BID  . Chlorhexidine Gluconate Cloth  6 each Topical Daily  . digoxin  0.125 mg Oral Daily  . folic acid  1 mg Oral Daily  . lactulose  10 g Oral BID  . multivitamin with minerals  1 tablet Oral Daily  . nicotine  21 mg Transdermal Daily  . [START ON 08/24/2019] pantoprazole  40 mg Intravenous Q12H  . potassium chloride  20 mEq Oral BID  . rifaximin  550 mg Oral BID  . sodium chloride flush  10 mL Intravenous Q12H  .  sodium chloride flush  10 mL Intravenous Q12H  . thiamine  100 mg Oral Daily   Infusions:  . sodium chloride Stopped (08/20/19 1943)  . cefTRIAXone (ROCEPHIN)  IV Stopped (08/21/19 1909)  . DOBUTamine 2.5 mcg/kg/min (08/21/19 2200)  . heparin 1,200 Units/hr (08/21/19 2200)  . octreotide  (SANDOSTATIN)    IV infusion 50 mcg/hr (08/21/19 2200)  . pantoprozole (PROTONIX) infusion 8 mg/hr (08/21/19 2200)    Assessment: [name] on heparin for LV thrombus and bilateral lower extremity ischemia w/ right popliteal vein DVT now s/p angioplasty and R/L popliteal thrombectomy of the tibial artery. Heparin therapy complicated by acute CVA and recent GI bleed and need for lower goal. Plans noted for coumadin when no further procedures  She is s/p mech thrombectomy of L popliteal 9/23. Heparin stopped 09/23 @ 1750 due to a GI bleed. Pt was started on octreotide and IV protonix. GI bleeding does not appear to be worse with black tarry stools resolving slowly. Hg yesterday was 5.9, but has now stabilized to 7.8 after 1 unit of PRBC this morning, down slightly to 7.5 tonight. Patient was previously therapeutic on  heparin rate of 1,750 units/hr.   Heparin level this evening was slightly below goal at 0.2, will make slight adjustment, but continue to be conservative with decreasing hgb.    Goal of Therapy:  Heparin level 0.3-0.5 units/ml Monitor platelets by anticoagulation protocol: Yes   Plan:  - Increase heparin to 1300 units/hr.  - Recheck Heparin level in am - Monitor daily CBC and heparin level - Watch for signs/symptoms of bleeding - Will follow procedural plans  Erin Hearing PharmD., BCPS Clinical Pharmacist 08/21/2019 10:44 PM

## 2019-08-22 ENCOUNTER — Encounter (HOSPITAL_COMMUNITY)
Admission: EM | Disposition: A | Discharge status: Skilled Nursing Facility | Payer: Self-pay | Source: Home / Self Care | Attending: Internal Medicine

## 2019-08-22 ENCOUNTER — Inpatient Hospital Stay (HOSPITAL_COMMUNITY): Payer: 59

## 2019-08-22 DIAGNOSIS — D62 Acute posthemorrhagic anemia: Secondary | ICD-10-CM

## 2019-08-22 DIAGNOSIS — J9811 Atelectasis: Secondary | ICD-10-CM

## 2019-08-22 DIAGNOSIS — K922 Gastrointestinal hemorrhage, unspecified: Secondary | ICD-10-CM | POA: Diagnosis not present

## 2019-08-22 DIAGNOSIS — K7290 Hepatic failure, unspecified without coma: Secondary | ICD-10-CM

## 2019-08-22 LAB — CBC
HCT: 22.1 % — ABNORMAL LOW (ref 36.0–46.0)
HCT: 26.8 % — ABNORMAL LOW (ref 36.0–46.0)
Hemoglobin: 7.5 g/dL — ABNORMAL LOW (ref 12.0–15.0)
Hemoglobin: 9.4 g/dL — ABNORMAL LOW (ref 12.0–15.0)
MCH: 32.3 pg (ref 26.0–34.0)
MCH: 33 pg (ref 26.0–34.0)
MCHC: 33.9 g/dL (ref 30.0–36.0)
MCHC: 35.1 g/dL (ref 30.0–36.0)
MCV: 95.3 fL (ref 80.0–100.0)
MCV: 94 fL (ref 80.0–100.0)
Platelets: 148 10*3/uL — ABNORMAL LOW (ref 150–400)
Platelets: 136 10*3/uL — ABNORMAL LOW (ref 150–400)
RBC: 2.32 MIL/uL — ABNORMAL LOW (ref 3.87–5.11)
RBC: 2.85 MIL/uL — ABNORMAL LOW (ref 3.87–5.11)
RDW: 21.8 % — ABNORMAL HIGH (ref 11.5–15.5)
RDW: 20.4 % — ABNORMAL HIGH (ref 11.5–15.5)
WBC: 15.2 10*3/uL — ABNORMAL HIGH (ref 4.0–10.5)
WBC: 15.3 10*3/uL — ABNORMAL HIGH (ref 4.0–10.5)
nRBC: 0.1 % (ref 0.0–0.2)
nRBC: 0.2 % (ref 0.0–0.2)

## 2019-08-22 LAB — COMPREHENSIVE METABOLIC PANEL
ALT: 29 U/L (ref 0–44)
AST: 41 U/L (ref 15–41)
Albumin: 1.1 g/dL — ABNORMAL LOW (ref 3.5–5.0)
Alkaline Phosphatase: 65 U/L (ref 38–126)
Anion gap: 9 (ref 5–15)
BUN: 76 mg/dL — ABNORMAL HIGH (ref 6–20)
CO2: 19 mmol/L — ABNORMAL LOW (ref 22–32)
Calcium: 7.3 mg/dL — ABNORMAL LOW (ref 8.9–10.3)
Chloride: 107 mmol/L (ref 98–111)
Creatinine, Ser: 1.78 mg/dL — ABNORMAL HIGH (ref 0.44–1.00)
GFR calc Af Amer: 37 mL/min — ABNORMAL LOW (ref >60)
GFR calc non Af Amer: 32 mL/min — ABNORMAL LOW (ref >60)
Glucose, Bld: 103 mg/dL — ABNORMAL HIGH (ref 70–99)
Potassium: 3.5 mmol/L (ref 3.5–5.1)
Sodium: 135 mmol/L (ref 135–145)
Total Bilirubin: 5.7 mg/dL — ABNORMAL HIGH (ref 0.3–1.2)
Total Protein: 4.4 g/dL — ABNORMAL LOW (ref 6.5–8.1)

## 2019-08-22 LAB — HEMOGLOBIN AND HEMATOCRIT, BLOOD
HCT: 25.5 % — ABNORMAL LOW (ref 36.0–46.0)
Hemoglobin: 9.1 g/dL — ABNORMAL LOW (ref 12.0–15.0)

## 2019-08-22 LAB — HEPARIN LEVEL (UNFRACTIONATED)
Heparin Unfractionated: 0.46 IU/mL (ref 0.30–0.70)
Heparin Unfractionated: 0.6 IU/mL (ref 0.30–0.70)

## 2019-08-22 LAB — DIGOXIN LEVEL: Digoxin Level: 0.4 ng/mL — ABNORMAL LOW (ref 0.8–2.0)

## 2019-08-22 LAB — PREPARE RBC (CROSSMATCH)

## 2019-08-22 LAB — COOXEMETRY PANEL
Carboxyhemoglobin: 1.6 % — ABNORMAL HIGH (ref 0.5–1.5)
Methemoglobin: 0.8 % (ref 0.0–1.5)
O2 Saturation: 83.3 %
Total hemoglobin: 7.7 g/dL — ABNORMAL LOW (ref 12.0–16.0)

## 2019-08-22 LAB — PHOSPHORUS: Phosphorus: 4.3 mg/dL (ref 2.5–4.6)

## 2019-08-22 LAB — MRSA PCR SCREENING: MRSA by PCR: NEGATIVE

## 2019-08-22 LAB — MAGNESIUM: Magnesium: 2 mg/dL (ref 1.7–2.4)

## 2019-08-22 SURGERY — RIGHT/LEFT HEART CATH AND CORONARY ANGIOGRAPHY
Anesthesia: LOCAL

## 2019-08-22 MED ORDER — SODIUM CHLORIDE 0.9% IV SOLUTION
Freq: Once | INTRAVENOUS | Status: AC
  Administered 2019-08-22: 09:00:00 10 mL via INTRAVENOUS

## 2019-08-22 MED ORDER — FUROSEMIDE 10 MG/ML IJ SOLN
40.0000 mg | Freq: Once | INTRAMUSCULAR | Status: AC
  Administered 2019-08-22: 40 mg via INTRAVENOUS
  Filled 2019-08-22: qty 4

## 2019-08-22 NOTE — Progress Notes (Signed)
ANTICOAGULATION CONSULT NOTE - Follow Up Consult  Pharmacy Consult for Heparin Indication: DVT, LV thrombus, and acute CVA  Allergies  Allergen Reactions  . Penicillins Other (See Comments)    Unsure of reaction Did it involve swelling of the face/tongue/throat, SOB, or low BP? Unknown Did it involve sudden or severe rash/hives, skin peeling, or any reaction on the inside of your mouth or nose? Unknown Did you need to seek medical attention at a hospital or doctor's office? Unknown When did it last happen?[place] If all above answers are "NO", may proceed with cephalosporin use.    Patient Measurements: Height: 5\' 5"  (165.1 cm) Weight: 179 lb 7.3 oz (81.4 kg) IBW/kg (Calculated) : 57 Heparin Dosing Weight: 75 kg  Vital Signs: Temp: 98.5 F (36.9 C) (09/25 0400) Temp Source: Oral (09/25 0400) BP: 112/62 (09/25 0600) Pulse Rate: 91 (09/25 0400)  Labs: Recent Labs    08/20/19 0141 08/20/19 1740 08/21/19 0340 08/21/19 1200 08/21/19 1939 08/21/19 2212 08/22/19 0515  HGB 9.2* 5.9* 8.4* 7.8* 7.5*  --  7.5*  HCT 27.7* 17.6* 24.5* 21.7* 21.9*  --  22.1*  PLT 132* 120* 120*  --   --   --  148*  LABPROT 20.0*  --   --   --   --   --   --   INR 1.7*  --   --   --   --   --   --   HEPARINUNFRC 0.44  --   --   --  1.42* 0.20* 0.46  CREATININE 1.51*  --  1.84*  --   --   --   --     Estimated Creatinine Clearance: 36.9 mL/min (A) (by C-G formula based on SCr of 1.84 mg/dL (H)).   Medications:  Scheduled:  . chlorhexidine  15 mL Mouth/Throat BID  . Chlorhexidine Gluconate Cloth  6 each Topical Daily  . digoxin  0.125 mg Oral Daily  . folic acid  1 mg Oral Daily  . lactulose  10 g Oral BID  . multivitamin with minerals  1 tablet Oral Daily  . nicotine  21 mg Transdermal Daily  . [START ON 08/24/2019] pantoprazole  40 mg Intravenous Q12H  . potassium chloride  20 mEq Oral BID  . rifaximin  550 mg Oral BID  . sodium chloride flush  10 mL Intravenous Q12H  . sodium  chloride flush  10 mL Intravenous Q12H  . thiamine  100 mg Oral Daily   Infusions:  . sodium chloride Stopped (08/20/19 1943)  . cefTRIAXone (ROCEPHIN)  IV Stopped (08/21/19 1909)  . DOBUTamine 2.5 mcg/kg/min (08/22/19 0600)  . heparin 1,300 Units/hr (08/22/19 0600)  . octreotide  (SANDOSTATIN)    IV infusion 50 mcg/hr (08/22/19 0600)  . pantoprozole (PROTONIX) infusion 8 mg/hr (08/22/19 0600)    Assessment: [name] on heparin for LV thrombus and bilateral lower extremity ischemia w/ right popliteal vein DVT now s/p angioplasty and R/L popliteal thrombectomy of the tibial artery. Heparin therapy complicated by acute CVA and recent GI bleed and need for lower goal. Plans noted for coumadin when no further procedures  She is s/p mech thrombectomy of L popliteal 9/23. Heparin stopped 09/23 @ 1750 due to a GI bleed. Pt was started on octreotide and IV protonix. GI bleeding does not appear to be worse with black tarry stools resolving slowly. Hg yesterday was 5.9, but has now stabilized to 7.8 after 1 unit of PRBC this morning, down slightly to 7.5  tonight. Patient was previously therapeutic on heparin rate of 1,750 units/hr.   Heparin level therapeutic s/p rate increase to 1300 units/hr, no reports of bleeding overnight   Goal of Therapy:  Heparin level 0.3-0.5 units/ml Monitor platelets by anticoagulation protocol: Yes   Plan:  Continue heparin gtt at 1300 units/hr F/u heparin level in 6 hours to confirm   Daylene Posey, PharmD Clinical Pharmacist Please check AMION for all [facility] numbers 08/22/2019 6:13 AM

## 2019-08-22 NOTE — Progress Notes (Signed)
Daily Progress Note   Patient Name: Briana Andrews       Date: 08/22/2019 DOB: December 17, 1965  Age: 53 y.o. MRN#: [id number] Attending Physician: Brand Males, MD Primary Care Physician: Patient, [name] Admit Date: 08/15/2019  Reason for Consultation/Follow-up: Establishing goals of care  Subjective: Patient awake, continues to be confused, is unaware that she has had any type of surgery, or why she is in the hospital. Her spouse is encouraged by the fact that someone called him today to make an outpatient appointment for October- he believes this means that all doctors believe she is going to make a full recovery and go home.  We discussed that while she is making some improvements (liver labs are improving, Cr trending down today - both likely due to increased CO and hopefully resolving cardiogenic shock due to dobutamine infusion)- [name] remains in a very tenuous state with severe heart failure.  Tim then did acknowledge that "[name] is not likely to going to return to being her full self".  ROS  Length of Stay: 6  Current Medications: Scheduled Meds:  . chlorhexidine  15 mL Mouth/Throat BID  . Chlorhexidine Gluconate Cloth  6 each Topical Daily  . digoxin  0.125 mg Oral Daily  . folic acid  1 mg Oral Daily  . lactulose  10 g Oral BID  . multivitamin with minerals  1 tablet Oral Daily  . nicotine  21 mg Transdermal Daily  . [START ON 08/24/2019] pantoprazole  40 mg Intravenous Q12H  . potassium chloride  20 mEq Oral BID  . rifaximin  550 mg Oral BID  . sodium chloride flush  10 mL Intravenous Q12H  . sodium chloride flush  10 mL Intravenous Q12H  . thiamine  100 mg Oral Daily    Continuous Infusions: . sodium chloride Stopped (08/20/19 1943)  . cefTRIAXone (ROCEPHIN)  IV  Stopped (08/21/19 1909)  . DOBUTamine 2.5 mcg/kg/min (08/22/19 1943)  . heparin 1,150 Units/hr (08/22/19 1502)  . octreotide  (SANDOSTATIN)    IV infusion 50 mcg/hr (08/22/19 1944)  . pantoprozole (PROTONIX) infusion 8 mg/hr (08/22/19 1945)    PRN Meds: sodium chloride, acetaminophen, albuterol, dextromethorphan-guaiFENesin, hydrALAZINE, labetalol, ondansetron (ZOFRAN) IV, ondansetron **OR** [DISCONTINUED] ondansetron (ZOFRAN) IV, oxyCODONE, sodium chloride flush, sodium chloride flush, sodium chloride flush  Physical Exam Vitals signs and  nursing note reviewed.  Skin:    Coloration: Skin is jaundiced.  Neurological:     Mental Status: She is alert. She is disoriented.             Vital Signs: BP 130/64   Pulse 78   Temp (!) 97.5 F (36.4 C) (Oral)   Resp 19   Ht 5\' 5"  (1.651 m)   Wt 81.4 kg   SpO2 99%   BMI 29.86 kg/m  SpO2: SpO2: 99 % O2 Device: O2 Device: Nasal Cannula O2 Flow Rate: O2 Flow Rate (L/min): 2 L/min  Intake/output summary:   Intake/Output Summary (Last 24 hours) at 08/22/2019 2003 Last data filed at 08/22/2019 1200 Gross per 24 hour  Intake 1463.95 ml  Output 1100 ml  Net 363.95 ml   LBM: Last BM Date: 08/22/19 Baseline Weight: Weight: 81.6 kg Most recent weight: Weight: 81.4 kg       Palliative Assessment/Data:      Patient Active Problem List   Diagnosis Date Noted  . Acute upper GI bleed   . Shock circulatory (HCC)   . Palliative care by specialist   . Goals of care, counseling/discussion   . Advanced care planning/counseling discussion   . Jaundice   . Cerebral embolism with cerebral infarction 08/17/2019  . PVD (peripheral vascular disease) (HCC) 08/17/2019  . Left ventricular apical thrombus without MI 08/17/2019  . Acute CHF (congestive heart failure) (HCC) 08/17/2019  . Abnormal LFTs 08/16/2019  . Acute metabolic encephalopathy 08/16/2019  . SOB (shortness of breath) 08/16/2019  . AKI (acute kidney injury) (HCC) 08/16/2019  .  Fall 08/16/2019  . Lactic acidosis 08/16/2019  . Back pain 08/16/2019  . Liver failure without hepatic coma (HCC) 08/16/2019  . Multifocal pneumonia 08/16/2019  . Alcohol abuse   . Tobacco abuse   . Shortness of breath     Palliative Care Assessment & Plan   Patient Profile: 53 y.o. female  with past medical history of  admitted on 08/15/2019 with weakness and jaundice. 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 She has progressed during admission to cardiogenic shock and AKI. Her albumin is significantly decreased at 1.2. She remains severely encephalopathic and hypotensive. Palliative medicine consulted for GOC. She has been made DNR by critical care.     Assessment/Recommendations/Plan   Spouse wishes to continue current level of care, he is interested in completing cardiac cath as offered  I am concerned about her very low albumin as this is a very significant predictor of mortality in the presence of all of her comorbidities  PMT will continue to follow closely and monitor patient's status and communicate with spouse regarding plan of care  Appreciate all providers being upfront with patient's spouse regarding her prognosis regarding not only survival but also recovery to prior state of functioning and overall likely end outcomes  Goals of Care and Additional Recommendations:  Limitations on Scope of Treatment: No Hemodialysis  Code Status:  DNR  Prognosis:   Unable to determine  Discharge Planning:  To Be Determined  Care plan was discussed with patient's husband.  Thank you for allowing the Palliative Medicine Team to assist in the care of this patient.   Time In:  1555 Time Out: 1630 Total Time 35 mins Prolonged Time Billed no      Greater than 50%  of this  time was spent  counseling and coordinating care related to the above assessment and plan.  Ocie Bob, AGNP-C Palliative Medicine   Please contact Palliative Medicine Team phone at [phone] for questions and concerns.

## 2019-08-22 NOTE — Progress Notes (Signed)
ANTICOAGULATION CONSULT NOTE - Follow Up Consult  Pharmacy Consult for Heparin Indication: DVT, LV thrombus, and acute CVA  Allergies  Allergen Reactions  . Penicillins Other (See Comments)    Unsure of reaction Did it involve swelling of the face/tongue/throat, SOB, or low BP? Unknown Did it involve sudden or severe rash/hives, skin peeling, or any reaction on the inside of your mouth or nose? Unknown Did you need to seek medical attention at a hospital or doctor's office? Unknown When did it last happen?[place] If all above answers are "NO", may proceed with cephalosporin use.    Patient Measurements: Height: 5\' 5"  (165.1 cm) Weight: 179 lb 7.3 oz (81.4 kg) IBW/kg (Calculated) : 57 Heparin Dosing Weight: 75 kg  Vital Signs: Temp: 97.5 F (36.4 C) (09/25 1300) Temp Source: Oral (09/25 1300) BP: 130/64 (09/25 1300) Pulse Rate: 78 (09/25 1053)  Labs: Recent Labs    08/20/19 0141  08/21/19 0340  08/21/19 1939 08/21/19 2212 08/22/19 0515 08/22/19 1125  HGB 9.2*   < > 8.4*   < > 7.5*  --  7.5* 9.4*  HCT 27.7*   < > 24.5*   < > 21.9*  --  22.1* 26.8*  PLT 132*   < > 120*  --   --   --  148* 136*  LABPROT 20.0*  --   --   --   --   --   --   --   INR 1.7*  --   --   --   --   --   --   --   HEPARINUNFRC 0.44  --   --   --  1.42* 0.20* 0.46 0.60  CREATININE 1.51*  --  1.84*  --   --   --  1.78*  --    < > = values in this interval not displayed.    Estimated Creatinine Clearance: 38.1 mL/min (A) (by C-G formula based on SCr of 1.78 mg/dL (H)).   Medications:  Scheduled:  . chlorhexidine  15 mL Mouth/Throat BID  . Chlorhexidine Gluconate Cloth  6 each Topical Daily  . digoxin  0.125 mg Oral Daily  . folic acid  1 mg Oral Daily  . lactulose  10 g Oral BID  . multivitamin with minerals  1 tablet Oral Daily  . nicotine  21 mg Transdermal Daily  . [START ON 08/24/2019] pantoprazole  40 mg Intravenous Q12H  . potassium chloride  20 mEq Oral BID  . rifaximin   550 mg Oral BID  . sodium chloride flush  10 mL Intravenous Q12H  . sodium chloride flush  10 mL Intravenous Q12H  . thiamine  100 mg Oral Daily   Infusions:  . sodium chloride Stopped (08/20/19 1943)  . cefTRIAXone (ROCEPHIN)  IV Stopped (08/21/19 1909)  . DOBUTamine 2.5 mcg/kg/min (08/22/19 1200)  . heparin 1,300 Units/hr (08/22/19 1200)  . octreotide  (SANDOSTATIN)    IV infusion 50 mcg/hr (08/22/19 1200)  . pantoprozole (PROTONIX) infusion 8 mg/hr (08/22/19 1200)    Assessment: [name] on heparin for LV thrombus and bilateral lower extremity ischemia w/ right popliteal vein DVT now s/p angioplasty and R/L popliteal thrombectomy of the tibial artery. Heparin therapy complicated by acute CVA and recent GI bleed and need for lower goal. Plans noted for coumadin when no further procedures  She is s/p mech thrombectomy of L popliteal 9/23. Heparin stopped 09/23 @ 1750 due to a GI bleed. Pt was started on octreotide and  IV protonix. GI bleeding does not appear to be worse with black tarry stools resolving slowly. Hgb 7.5 this am repeat 1unit PRBC Heparin level 0.6 upper end of goal on heparin drip 1300 units/hr Heparin infusing in Midline and HL drawn from CL - should be accurate  Goal of Therapy:  Heparin level 0.3-0.5 units/ml Monitor platelets by anticoagulation protocol: Yes   Plan:  Decrease heparin gtt 110 5units/hr Daily HL, CBC Monitor s/s bleeding  Bonnita Nasuti Pharm.D. CPP, BCPS Clinical Pharmacist [phone] 08/22/2019 2:39 PM

## 2019-08-22 NOTE — Progress Notes (Addendum)
`  NAME:  Briana Andrews, MRN:  [id number], DOB:  16-Jul-1966, LOS: 6 ADMISSION DATE:  08/15/2019, CONSULTATION DATE: 9/23 REFERRING MD:  Marigene Ehlers, CHIEF COMPLAINT:  Post-op shock   Brief History   53 yof admitted 9/19 w/ AMS and lethargy. 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 She was seen by multiple sub specialists including: Neuro, cardiology, vascular surgery and GI. She underwent subsequent mechanical thrombectomy of the lower extremities (the right on 9/22 and the left on 9/23). PCCM asked to see on 9/23 as she was hypotensive   History of present illness   53 year old white female admitted 9/19 w/ cc: confusion and weakness progressing acutely over about a weeks time.   In ER found to have: elevated LFTs, elevated LA, pcxr w/ multi-focal airspace disease concerning for PNA,  neuro was consulted after an MRI of brain showed subacute right frontal ACA territory infarct and small right caudate and cerebellar infarcts.  As part of the work up an echo was obtained. This showed: new CM EF 15-20%  W/ large fixed LV thrombus and was started on [facility] and cardiology was consulted. She was also seen by vascular surg w/ concern for ischemic LEs. US showed bilateral total popliteal artery occlusion w/ DVT on right.  GI was consulted for liver failure, heme + stool and dropping hgb.  She went to the OR on 9/22 for thrombectomy of the RLE and  9/23 for left LE thrombectomy of the left post tib and popliteal artery.  She returned to the med-surg floor hypotensive and more confused which was why PCCM was consulted.   Past Medical History  ETOH and tobacco abuse.   Significant Hospital Events   9/19 admitted. MRI  subacute right frontal ACA territory infarct and small right caudate and cerebellar infarcts.  As part of the work up an  echo was obtained. 9/20 ECHO showed: new CM EF 15-20%  W/ large fixed LV thrombus and was started on [facility] and cardiology was consulted. She was also seen by vascular surg w/ concern for ischemic LEs. US showed bilateral total popliteal artery occlusion w/ DVT on right.  GI was consulted for liver failure, heme + stool and dropping hgb.  9/22 for thrombectomy of the RLE   9/23 for left LE thrombectomy of the left post tib and popliteal artery. She returned to the med-surg floor hypotensive and more confused which was why PCCM was consulted. Also having melanotic stool w/ hgb drop so GI starting octreotide and ppi gtt. Transferred to ICU, DNR order placed. Palliative consulted-- no escalation of care  9/24> Improved mentation. Heparin resumed. Continues on dobutamine 9/25> continues on dobutamine Consults:  Neuro 9/19 Cardiology 9/19 Vascular surgey 9/19 GI  PCCM 9/23 Palliative Care Medicine 9/23   Procedures:  9/22 Mechanical thrombectomy of the right superficial femoral, popliteal, and posterior tibial artery using the penumbra CAT 6 device 9/23 Mechanical thrombectomy of the left popliteal and posterior tibial artery using the Penumbra CAT 6 device Significant Diagnostic Tests:  Echo 9/19: Left Ventricle: Left ventricular ejection fraction, by visual estimation, is 20%. The left ventricle has severely decreased function. There is a large, fixed, apical left ventricular thrombus. Global RV systolic function is has severely reduced systolic function Mri brain 9/19: 1. Gyriform post ischemic appearing enhancement in the right ACA  territory, corresponding to most confluent diffusion abnormality earlier today. 2. Faint linear enhancement in the left frontal lobe white matter appears to reflect an area of late subacute ischemia. CT abd nml pancreas, no biliary dilation, no gallstones.   9/24 Vascular US> RLe moderate arterial disease. LLE moderate arterial disease.   Micro Data:  9/19 SARS  cov2> neg 9/19 RVP> neg 9/19 BCx> NGTD  Antimicrobials:  levaquin 9/21>>> Ceftriaxone 9/23>>  Interim history/subjective:  Continues on dobutamine this morning  Asking for water  Objective   Blood pressure 116/75, pulse 91, temperature 98.5 F (36.9 C), temperature source Oral, resp. rate 12, height 5\' 5"  (1.651 m), weight 81.4 kg, SpO2 100 %. CVP:  [4 mmHg-7 mmHg] 7 mmHg      Intake/Output Summary (Last 24 hours) at 08/22/2019 0747 Last data filed at 08/22/2019 0600 Gross per 24 hour  Intake 2459.99 ml  Output 975 ml  Net 1484.99 ml   Filed Weights   08/20/19 0519 08/21/19 0431 08/22/19 0415  Weight: 81.2 kg 80.3 kg 81.4 kg    Examination: General: Acute on chronically ill appearing adult F, reclined in bed NAD   HENT: NCAT. Pink mmm. Patent nares, Glass[place] in place. Icteric sclera  Lungs: CTA. Symmetrical chest expansion, no accessory muscle use on [place]  Cardiovascular: RRR s1s2 no rgm. Cap refill < 3 seconds BUE  Abdomen: Soft, round, ndnt. + bowel sounds  Extremities: Trace BUE edema. BLE 2+ edema. No clubbing.  Neuro: Awake, alert, oriented x 3, following commands.  GU: without obvious external abnormality  Skin: jaundice, pale, cool, dry.   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 Continue Dobutamine at 2.5 per HF  -Continue to monitor CVP, Coox  -Per Advanced heart failure, not a candidate for advanced cardiac therapies.  -If recovers from critical illness, will need consideration of R/L heart cath -- discussed with HF, likely Monday 9/28 pending renal function, illness trajectory over weekend  -Continue abx  -Trend WBC, fever curve  -Goal euvolemia-- IVF vs blood  product as needed for euvolemia  -Trend UOP   Large LV thrombus  Plan P -Heparin gtt   Acute metabolic encephalopathy 2/2 shock, hepatic dysfunction, recent subacute CVA -At risk for EtOH withdrawal  Plan -Continue lactulose  -minimize sedating agents as able -RN delirium bundle  -Shock management as above for adequate perfusion  Acute GIB Acute blood loss anemia, stable  -GIB etiology r/t EtOH hepatitis vs Hepatic dysfunction 2/2 RV failure  Plan -GI following -Possible EGD in future, if patient recovers from critical illness -H/H stable but bordering transfusion threshold, continue to trend H/H -Transfuse as indicated -Continue octreotide, protonix -Heparin restarted for LV clot, BLE arterial occlusions. Dose per pharmacy, close monitoring for s/sx bleeding   Bilateral pulmonary opacities -PNA vs edema vs atelectasis Plan -DNR/DNI -Supplemental O2 for SpO2 goal > 92% -Pulm hygiene -Continue abx  -AM CXR   BLE arterial occlusion, s/p bilateral thrombectomy Plan -Vascular following, appreciate recs -Serial vascular checks - IV heparin as above   AKI, improving  -likely multifactorial in setting of shock, contrast medium -at risk for progression of renal injury  Plan -MAP goal > 65 as per shock plans -Trend renal function, UOP -appreciate pharmacy assistance with medication dosing  Malnutrition in setting of EtOH use disorder At risk fluid and electrolyte imbalance Plan -Trend BMP mag phos replace as indicated -Continue B vitamins, Multivitamin   Jaundice -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  He understands the challenges of requiring  anticoagulation in the setting of active bleeding.  At this point our plan is to try to treat her through shock state.  Should she develop cardiopulmonary arrest we will not offer CPR"  Per Palliative Care 9/23> "Spouse wishes to continue current level of care without escalation for now- if patient declines further he would wish to transition to full comfort measures. If she fails to improve in the next 24-48 hours he would also consider de-escalating care and transitioning to full comfort care" Plan -DNR/DNI -Continue current level of care without escalation at present -ICU goal is to support through acute critical illness course -  If declines or fails to recover, likely de-escalation of care and transition to comfort.  -Appreciate palliative care medicine assistance    Best practice:  Diet: NPO sips with meds. SLP  Pain/Anxiety/Delirium protocol (if indicated): NA VAP protocol (if indicated): NA DVT prophylaxis: Heparin gtt  GI prophylaxis: PPI gtt  Glucose control: SSI  Mobility: BR Code Status: DNR Family Communication: Patient updated 9.25 Disposition: ICU. Remains critically ill and at risk for further acute decompensation.   Labs   CBC: Recent Labs  Lab 08/15/19 2350  08/19/19 0425 08/20/19 0141 08/20/19 1740 08/21/19 0340 08/21/19 1200 08/21/19 1939 08/22/19 0515  WBC 9.7   < > 15.6* 20.1* 15.8* 14.7*  --   --  15.2*  NEUTROABS 7.7  --   --   --   --   --   --   --   --   HGB 16.9*   < > 11.9* 9.2* 5.9* 8.4* 7.8* 7.5* 7.5*  HCT 48.6*   < > 33.0* 27.7* 17.6* 24.5* 21.7* 21.9* 22.1*  MCV 100.8*   < > 98.8 103.7* 106.0* 94.6  --   --  95.3  PLT 134*   < > 107* 132* 120* 120*  --   --  148*   < > = values in this interval not displayed.    Basic Metabolic Panel: Recent Labs  Lab 08/18/19 0407 08/19/19 0425 08/20/19 0141 08/21/19 0340 08/22/19 0515  NA 132* 134* 136 138 135  K 4.3 2.7* 3.7 3.7 3.5  CL 100 100 105 109 107  CO2 19* 23 25 19* 19*  GLUCOSE 83  98 92 119* 103*  BUN 39* 47* 67* 82* 76*  CREATININE 1.48* 1.41* 1.51* 1.84* 1.78*  CALCIUM 7.9* 7.6* 7.5* 7.3* 7.3*  MG 1.9  --  1.6*  --  2.0  PHOS  --   --  3.5  --  4.3   GFR: Estimated Creatinine Clearance: 38.1 mL/min (A) (by C-G formula based on SCr of 1.78 mg/dL (H)). Recent Labs  Lab 08/15/19 2350 08/16/19 0400  08/20/19 0141 08/20/19 1740 08/21/19 0340 08/22/19 0515  WBC 9.7  --    < > 20.1* 15.8* 14.7* 15.2*  LATICACIDVEN 2.8* 2.0*  --   --   --   --   --    < > =  values in this interval not displayed.    Liver Function Tests: Recent Labs  Lab 08/18/19 0407 08/19/19 0425 08/20/19 0141 08/21/19 0340 08/22/19 0515  AST 69* 48* 44* 38 41  ALT 38 35 36 31 29  ALKPHOS 75 66 70 66 65  BILITOT 11.8* 9.3* 7.7* 6.2* 5.7*  PROT 5.9* 4.7* 4.7* 4.1* 4.4*  ALBUMIN 1.7* 1.3* 1.2* 1.1* 1.1*   Recent Labs  Lab 08/15/19 2350  LIPASE 49   Recent Labs  Lab 08/15/19 2350 08/17/19 0439 08/18/19 0407  AMMONIA 18 39* 42*    ABG    Component Value Date/Time   O2SAT 83.3 08/22/2019 0515     Coagulation Profile: Recent Labs  Lab 08/16/19 0016 08/17/19 0439 08/18/19 0407 08/19/19 0425 08/20/19 0141  INR 1.7* 1.9* 1.6* 1.6* 1.7*    Cardiac Enzymes: Recent Labs  Lab 08/16/19 1948  CKTOTAL 196    HbA1C: Hgb A1c MFr Bld  Date/Time Value Ref Range Status  08/17/2019 04:39 AM 5.8 (H) 4.8 - 5.6 % Final    Comment:    (NOTE) Pre diabetes:          5.7%-6.4% Diabetes:              >6.4% Glycemic control for   <7.0% adults with diabetes     CBG: Recent Labs  Lab 08/16/19 1028  GLUCAP 105*      CRITICAL CARE  Total critical care time: 35 minutes  Critical care time was exclusive of separately billable procedures and treating other patients. Critical care was necessary to treat or prevent imminent or life-threatening deterioration.  Critical care was time spent personally by me on the following activities: development of treatment plan with  patient and/or surrogate as well as nursing, discussions with consultants, evaluation of patient's response to treatment, examination of patient, obtaining history from patient or surrogate, ordering and performing treatments and interventions, ordering and review of laboratory studies, ordering and review of radiographic studies, pulse oximetry and re-evaluation of patient's condition.  Tessie Fass MSN, AGACNP-BC [facility] [phone] If no answer, [phone] 08/22/2019, 8:47 AM

## 2019-08-22 NOTE — Progress Notes (Signed)
Initial Nutrition Assessment  DOCUMENTATION CODES:   Not applicable  INTERVENTION:   - Diet advancement as appropriate  - Continue MVI with minerals daily  - Once diet advanced, Ensure Enlive po TID, each supplement provides 350 kcal and 20 grams of protein  - Once diet advanced, Pro-stat 30 ml BID, each supplement provides 100 kcal and 15 grams of protien  NUTRITION DIAGNOSIS:   Moderate Malnutrition related to social / environmental circumstances (EtOH abuse) as evidenced by mild fat depletion, moderate fat depletion, mild muscle depletion, moderate muscle depletion.  GOAL:   Patient will meet greater than or equal to 90% of their needs  MONITOR:   Diet advancement, Labs, PO intake, Weight trends, I & O's, Skin  REASON FOR ASSESSMENT:   Low Braden    ASSESSMENT:   53 year old female who presented to the ED on 9/18 with generalized weakness, AMS, and SOB. PMH of tobacco abuse, EtOH abuse. Pt admitted with acute liver failure and multifocal pneumonia. Infarct seen on MRI. CT abdomen showing cardiomegaly, moderate right-sided pleural effusion, and aortic atherosclerosis. Pt now with newly diagnosed biventricular CHF.   9/22 - s/p aortogram, right popliteal artery occlusion treated with thrombectomy 9/23 - left popliteal artery occlusion treated with thrombectomy, transferred to ICU due to hypotension  Pt now with combined cardiogenic and septic shock and AKI. Palliative care team following. Noted plan for RHC/LHC on Monday.  Discussed pt with RN and during ICU rounds. Pt is NPO at this time. RN unaware of plan for diet advancement.  Spoke with pt briefly at bedside. Pt confused, slow to respond, and somewhat disoriented. Pt reports she has not had much of an appetite recently but is unable to provide diet or weight history. No weight history available in chart.  Meal Completion: 0-25% x 4 recorded meals  Medications reviewed and include: folic acid, lactulose, MVI with  minerals, Protonix, K-dur 20 mEq BID, thiamine, IV abx, dobutamine, heparin, Protonix drip, octreotide  Labs reviewed: BUN 76, creatinine 1.78, hemoglobin 7.5  UOP: 975 ml x 24 hours I/O's: +8.5 L since admit  NUTRITION - FOCUSED PHYSICAL EXAM:    Most Recent Value  Orbital Region  Moderate depletion  Upper Arm Region  Mild depletion  Thoracic and Lumbar Region  No depletion  Buccal Region  Mild depletion  Temple Region  Mild depletion  Clavicle Bone Region  Moderate depletion  Clavicle and Acromion Bone Region  Moderate depletion  Scapular Bone Region  Mild depletion  Dorsal Hand  Mild depletion  Patellar Region  Mild depletion  Anterior Thigh Region  Mild depletion  Posterior Calf Region  No depletion  Edema (RD Assessment)  Moderate [BUE, BLE]  Hair  Reviewed  Eyes  Reviewed  Mouth  Reviewed  Skin  Other (Comment) [jaundiced]  Nails  Reviewed       Diet Order:   Diet Order            Diet NPO time specified Except for: Sips with Meds  Diet effective now              EDUCATION NEEDS:   Not appropriate for education at this time  Skin:  Skin Assessment: Skin Integrity Issues: Skin Integrity Issues: Stage I: left heel, right heel Other: blistered area to right foot, right leg and non-pressure wound to right pretibial  Last BM:  08/21/19 rectal tube  Height:   Ht Readings from Last 1 Encounters:  08/15/19 5\' 5"  (1.651 m)    Weight:  Wt Readings from Last 1 Encounters:  08/22/19 81.4 kg    Ideal Body Weight:  56.8 kg  BMI:  Body mass index is 29.86 kg/m.  Estimated Nutritional Needs:   Kcal:  1850-2050  Protein:  100-115 grams  Fluid:  >/= 1.8 L    Gaynell Face, MS, RD, LDN Inpatient Clinical Dietitian Pager: [phone] Weekend/After Hours: [phone]

## 2019-08-22 NOTE — Progress Notes (Signed)
MD and NP in the room at this time to fix patient's central line hold off on PIV

## 2019-08-22 NOTE — Procedures (Signed)
Central Venous Catheter Insertion Procedure Note Briana Andrews [id number] [birth year]  Procedure: Insertion of Central Venous Catheter Indications: Drug and/or fluid administration  Procedure Details Consent: Risks of procedure as well as the alternatives and risks of each were explained to the (patient/caregiver).  Consent for procedure obtained.verbal consent Time Out: Verified patient identification, verified procedure, site/side was marked, verified correct patient position, special equipment/implants available, medications/allergies/relevent history reviewed, required imaging and test results available.  Performed   Maximum sterile technique was used including antiseptics, cap, gloves, gown, hand hygiene, mask and sheet. Skin prep: Chlorhexidine; local anesthetic administered A antimicrobial bonded/coated triple lumen catheter was placed in the right internal jugular vein using the Seldinger technique over guidewire  Evaluation Blood flow good Complications: No apparent complications Patient did tolerate procedure well. Chest X-ray ordered to verify placement.  CXR: pending.  Briana Andrews 08/22/2019, 6:58 PM

## 2019-08-22 NOTE — Progress Notes (Addendum)
Patient ID: Briana Andrews, female   DOB: 02/06/1966, 53 y.o.   MRN: [id number]     Advanced Heart Failure Rounding Note  PCP-Cardiologist: Tobias AlexanderKatarina Nelson, MD   Subjective:    Remains on dobutamine 2.5 mcg. CO-OX stable 83%. Yesterday given IV fluids and heparin was restarted.   Much more alert today. Denies SOB . Denies abdominal pain. Complaining left foot pain.   Objective:   Weight Range: 81.4 kg Body mass index is 29.86 kg/m.   Vital Signs:   Temp:  [97.5 F (36.4 C)-98.6 F (37 C)] 98.5 F (36.9 C) (09/25 0400) Pulse Rate:  [86-92] 91 (09/25 0400) Resp:  [11-29] 12 (09/25 0645) BP: (88-140)/(42-80) 116/75 (09/25 0645) SpO2:  [94 %-100 %] 100 % (09/25 0645) Weight:  [81.4 kg] 81.4 kg (09/25 0415) Last BM Date: 08/21/19  Weight change: Filed Weights   08/20/19 0519 08/21/19 0431 08/22/19 0415  Weight: 81.2 kg 80.3 kg 81.4 kg    Intake/Output:   Intake/Output Summary (Last 24 hours) at 08/22/2019 [id number] Last data filed at 08/22/2019 0600 Gross per 24 hour  Intake 2459.99 ml  Output 975 ml  Net 1484.99 ml      Physical Exam   CVP 5-6   General:  No resp difficulty HEENT: normal Neck: supple. JVP 5-6 . Carotids 2+ bilat; no bruits. No lymphadenopathy or thryomegaly appreciated. RIJ  Cor: PMI nondisplaced. Regular rate & rhythm. No rubs, gallops or murmurs. Lungs: crackles in the bases.  Nasal Cannula  Abdomen: soft, nontender, nondistended. No hepatosplenomegaly. No bruits or masses. Good bowel sounds. Flexi Seal  With dark stool.  Extremities: no cyanosis, clubbing, rash, R and LLE 2-3+ edema. L foot dressing Neuro: alert & orientedx3, cranial nerves grossly intact. moves all 4 extremities w/o difficulty. Affect flat  Skin: Jaundice    Telemetry   NSr 90s personally reviewed.   Labs    CBC Recent Labs    08/21/19 0340  08/21/19 1939 08/22/19 0515  WBC 14.7*  --   --  15.2*  HGB 8.4*   < > 7.5* 7.5*  HCT 24.5*   < > 21.9* 22.1*  MCV 94.6  --    --  95.3  PLT 120*  --   --  148*   < > = values in this interval not displayed.   Basic Metabolic Panel Recent Labs    11/91/4709/23/20 0141 08/21/19 0340 08/22/19 0515  NA 136 138 135  K 3.7 3.7 3.5  CL 105 109 107  CO2 25 19* 19*  GLUCOSE 92 119* 103*  BUN 67* 82* 76*  CREATININE 1.51* 1.84* 1.78*  CALCIUM 7.5* 7.3* 7.3*  MG 1.6*  --  2.0  PHOS 3.5  --  4.3   Liver Function Tests Recent Labs    08/21/19 0340 08/22/19 0515  AST 38 41  ALT 31 29  ALKPHOS 66 65  BILITOT 6.2* 5.7*  PROT 4.1* 4.4*  ALBUMIN 1.1* 1.1*   No results for input(s): LIPASE, AMYLASE in the last 72 hours. Cardiac Enzymes No results for input(s): CKTOTAL, CKMB, CKMBINDEX, TROPONINI in the last 72 hours.  BNP: BNP (last 3 results) No results for input(s): BNP in the last 8760 hours.  ProBNP (last 3 results) No results for input(s): PROBNP in the last 8760 hours.   D-Dimer No results for input(s): DDIMER in the last 72 hours. Hemoglobin A1C No results for input(s): HGBA1C in the last 72 hours. Fasting Lipid Panel No results for input(s): CHOL, HDL, LDLCALC,  TRIG, CHOLHDL, LDLDIRECT in the last 72 hours. Thyroid Function Tests No results for input(s): TSH, T4TOTAL, T3FREE, THYROIDAB in the last 72 hours.  Invalid input(s): FREET3  Other results:   Imaging    Vas [place] Abi With/wo Tbi  Result Date: 08/21/2019 LOWER EXTREMITY DOPPLER STUDY Indications: Peripheral artery disease.  Vascular Interventions: 08/19/2019 - ABDOMINAL AORTOGRAM W/LOWER EXTREMITY                         PERIPHERAL VASCULAR BALLOON ANGIOPLASTY                          08/20/2019 - ABDOMINAL AORTOGRAM W/LOWER EXTREMITY                         PERIPHERAL VASCULAR INTERVENTION. Limitations: Today's exam was limited due to patient positioning, an open wound,              involuntary patient movement and patient pain tolerance. Comparison Study: No prior studies. Performing Technologist: Olen Cordial Rvt  Examination Guidelines: A  complete evaluation includes at minimum, Doppler waveform signals and systolic blood pressure reading at the level of bilateral brachial, anterior tibial, and posterior tibial arteries, when vessel segments are accessible. Bilateral testing is considered an integral part of a complete examination. Photoelectric Plethysmograph (PPG) waveforms and toe systolic pressure readings are included as required and additional duplex testing as needed. Limited examinations for reoccurring indications may be performed as noted.  ABI Findings: +--------+------------------+-----+--------+--------+ Right   Rt Pressure (mmHg)IndexWaveformComment  +--------+------------------+-----+--------+--------+ Brachial                               PICC     +--------+------------------+-----+--------+--------+ PTA     63                0.59 biphasic         +--------+------------------+-----+--------+--------+ DP      47                0.44 biphasic         +--------+------------------+-----+--------+--------+ +--------+------------------+-----+---------+-------+ Left    Lt Pressure (mmHg)IndexWaveform Comment +--------+------------------+-----+---------+-------+ [id number]                    triphasic        +--------+------------------+-----+---------+-------+ PTA     59                0.56 biphasic         +--------+------------------+-----+---------+-------+ DP      55                0.52 biphasic         +--------+------------------+-----+---------+-------+ +-------+-----------+-----------+------------+------------+ ABI/TBIToday's ABIToday's TBIPrevious ABIPrevious TBI +-------+-----------+-----------+------------+------------+ Right  0.59                                           +-------+-----------+-----------+------------+------------+ Left   0.56                                           +-------+-----------+-----------+------------+------------+  Summary: Right:  Resting right ankle-brachial index indicates moderate right lower extremity arterial disease. BP  cuff was placed on the proximal calf due to wounds on the distal calf/ankle. Unable to obtain TBI due to patient movement, and pain tolerance. Left: Resting left ankle-brachial index indicates moderate left lower extremity arterial disease. Unable to obtain TBI due to patient movement, and pain tolerance.  *See table(s) above for measurements and observations.  Electronically signed by Sherald Hess MD on 08/21/2019 at 1:58:43 PM.    Final      Medications:     Scheduled Medications: . chlorhexidine  15 mL Mouth/Throat BID  . Chlorhexidine Gluconate Cloth  6 each Topical Daily  . digoxin  0.125 mg Oral Daily  . folic acid  1 mg Oral Daily  . lactulose  10 g Oral BID  . multivitamin with minerals  1 tablet Oral Daily  . nicotine  21 mg Transdermal Daily  . [START ON 08/24/2019] pantoprazole  40 mg Intravenous Q12H  . potassium chloride  20 mEq Oral BID  . rifaximin  550 mg Oral BID  . sodium chloride flush  10 mL Intravenous Q12H  . sodium chloride flush  10 mL Intravenous Q12H  . thiamine  100 mg Oral Daily    Infusions: . sodium chloride Stopped (08/20/19 1943)  . cefTRIAXone (ROCEPHIN)  IV Stopped (08/21/19 1909)  . DOBUTamine 2.5 mcg/kg/min (08/22/19 0600)  . heparin 1,300 Units/hr (08/22/19 0600)  . octreotide  (SANDOSTATIN)    IV infusion 50 mcg/hr (08/22/19 0600)  . pantoprozole (PROTONIX) infusion 8 mg/hr (08/22/19 0600)    PRN Medications: sodium chloride, acetaminophen, albuterol, dextromethorphan-guaiFENesin, hydrALAZINE, labetalol, ondansetron (ZOFRAN) IV, ondansetron **OR** [DISCONTINUED] ondansetron (ZOFRAN) IV, oxyCODONE, sodium chloride flush, sodium chloride flush, sodium chloride flush   Assessment/Plan   1. Acute on chronic systolic CHF/cardiogenic shock: Echo with EF 15-20%, mild LV dilation, LV thrombus, mild-moderate MR, severely decreased RV systolic function.    Cause of cardiomyopathy is uncertain, but she was a heavy smoker and ECG is suggestive of possible old ASMI. Ischemic cardiomyopathy is possible, alternatively could be due to ETOH abuse (>6 pack/day per husband).  Lactate was elevated 4 days ago at admission.  Suspect cardiogenic shock with low BP, cannot rule out component of septic shock. Currently on dobutamine 2.5 with stable co-ox (48% pre-dobutamine, 83% this morning) and stable MAP.  CVP < 5.   - Continue dobutamine at 2.5.  - Now with significant lower extremity edema. Give 40 mg IV lasix x 1. - Eventually, she would ideally have right/left heart cath, probably Monday if creatinine stable and GI bleeding seems to have stopped.  - Would hold off on mechanical support given lack of good end point (not a candidate for advanced cardiac therapies).  - Continue digoxin.  2. Liver failure: Markedly elevated bilirubin with jaundice.  Liver imaging with [place] and CT has NOT shown cirrhosis or portal/hepatic vein thrombosis.  It is possible that this is due primarily to RV failure versus ETOH hepatitis.   Not statin.  3. Encephalopathy: ?Cause.  NH3 has not been markedly high but certainly could be hepatic encephalopathy.  Shock likely plays a role.  Much more alert today. Confusion resolving.   - She is on lactulose and rifaximin.  4. AKI: Creatinine unchanged 1.8.  Suspect cardiorenal syndrome, also had contrast with LE angiograms so possible component of contrast nephropathy.  - Support BP and cardiac output.  5. LV thrombus: Possible cause of embolic event to popliteal arteries.  Heparin gtt held with active GI bleeding. - Heparin drip restarted 9/24  6.  PAD: Bilateral popliteal artery occlusions, ?cardioembolic from LV thrombus.  Now s/p mechanical thrombectomy by vascular.  7. 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  Treatment will be anticoagulation.  8. Right popliteal vein DVT: Heparin gtt restarted 9/24 9. GI  bleeding: Melena and FOBT+.  Hgb down to 5.9, 2 unit transfusion 9/23 and now 7.5. On heparin drip.  - Give 1UPRBCs now.  Not on asa with bleeding.  - Protonix gtt and octreotide gtt per GI.  10. ETOH abuse: Per husband, at least a 6 pack daily.  11. ID: Concern for aspiration PNA. - Continue ceftriaxone.  12. DNR  Set up RHC/LHC for Monday.   Length of Stay: 6  Amy Clegg, NP  08/22/2019, 7:12 AM  Advanced Heart Failure Team Pager [phone] (M-F; [place])  Please contact [facility] for night-coverage after hours (4p -7a ) and weekends on [email or web address]  Patient seen with NP, agree with the above note.   She is doing better today, much more alert.  Hgb mildly lower at 7.5, active bleeding seems to be resolving.  She is back on heparin gtt. Co-ox good with CVP around 7.  However, she has quite significant lower extremity edema.  BP stable.  She remains on dobutamine 2.5.   General: NAD Neck: No JVD, no thyromegaly or thyroid nodule.  Lungs: Decreased BS at bases.  CV: Nondisplaced PMI.  Heart regular S1/S2, no S3/S4, no murmur.  1+ edema to thighs.   Abdomen: Soft, nontender, no hepatosplenomegaly, no distention.  Skin: Intact without lesions or rashes.  Neurologic: Alert, much less confused.   Psych: Normal affect. Extremities: Right foot skin sloughing  HEENT: Normal.   She is doing better today, much more alert.    GI bleeding seems to have slowed.  Hgb 7.5, will give 1 units PRBCs today.  She remains on IV Protonix and octreotide per GI.  She is tolerating restarting heparin.  Will need scope at some point, per GI.   She is on heparin gtt for LV thrombus with suspected multiple embolic events (subacute CVA, probable cardioembolism to bilateral popliteal arteries).   Vascular following post-bilateral popliteal thrombectomies.  Less pain in feet today.   Bilirubin trending down.  ?ETOH hepatitis (no cirrhosis), RV failure may be playing a role.   CVP not elevated.  Will give  1 dose Lasix today with significant peripheral edema.  Creatinine mildly lower today.    Continue dobutamine at current dose for now.    Plan for RHC/LHC on Monday if renal function stable and GI bleeding quiescent.   CRITICAL CARE Performed by: Loralie Champagne  Total critical care time: 35 minutes  Critical care time was exclusive of separately billable procedures and treating other patients.  Critical care was necessary to treat or prevent imminent or life-threatening deterioration.  Critical care was time spent personally by me on the following activities: development of treatment plan with patient and/or surrogate as well as nursing, discussions with consultants, evaluation of patient's response to treatment, examination of patient, obtaining history from patient or surrogate, ordering and performing treatments and interventions, ordering and review of laboratory studies, ordering and review of radiographic studies, pulse oximetry and re-evaluation of patient's condition.  Loralie Champagne 08/22/2019 1:05 PM

## 2019-08-22 NOTE — Progress Notes (Signed)
Physical Therapy Treatment Patient Details Name: Briana Andrews MRN: [id number] DOB: 09-01-1966 Today's Date: 08/22/2019    History of Present Illness 53 year old female with history of smoking, alcohol use, she drinks more than 6 packs of beer every day since last 20 years, no chronic medical issues brought to the emergency room with confusion more than usual and falls.  Since last few months there have been noticing some changes on her including weakness on her legs and poor appetite as much as she stopped smoking and decreased her drinking.  2 weeks ago, she was taken to [facility] with a fall, skeletal survey was negative except L1 fracture with 25% height loss and discharged home on muscle relaxants.  Patient has been intermittently confused since then. In the emergency room, she was found with subacute right ACA stroke, jaundice with bilirubin of 12, bilateral multifocal pneumonia and metabolic encephalopathy.    PT Comments    Pt admitted with above diagnosis. Pt was able to stand to [facility] with min assist and cues. Stood well in [place] up to 2 min stand with min guard assist.  Pt would not stand to RW as she was too fearful and could not get pt to clear bottom.  Pt very fatigued.  Pt currently with functional limitations due to the deficits listed below (see PT Problem List). Pt will benefit from skilled PT to increase their independence and safety with mobility to allow discharge to the venue listed below.     Follow Up Recommendations  CIR     Equipment Recommendations  Other (comment)(TBD)    Recommendations for Other Services Rehab consult     Precautions / Restrictions Precautions Precautions: Fall Restrictions Weight Bearing Restrictions: No    Mobility  Bed Mobility Overal bed mobility: Needs Assistance Bed Mobility: Supine to Sit     Supine to sit: Mod assist     General bed mobility comments: step by step sequencing cues needed, assist to pull up at  trunk into sitting and to square hips to EOB  Transfers Overall transfer level: Needs assistance Equipment used: Rolling walker (2 wheeled) Transfers: Sit to/from [employer] Sit to Stand: Min assist;+2 safety/equipment;Mod assist;From elevated surface Stand pivot transfers: Total assist;+2 safety/equipment       General transfer comment: Pt would not stand to RW even with cues and assist.  Pt kept scooting out instead of trying to stand and also would not place hands on bed and kept them on RW.  Fearful as well.  Ended up standing to [place] with assist to power up and steady.  did well with STedy.  moved pt to recliner with Stedy.  Pt did have right foot pain but was able to weight bear on the foot long enough to get into chair.   Ambulation/Gait                 Stairs             Wheelchair Mobility    Modified Rankin (Stroke Patients Only) Modified Rankin (Stroke Patients Only) Pre-Morbid Rankin Score: No symptoms Modified Rankin: Moderately severe disability     Balance Overall balance assessment: Needs assistance Sitting-balance support: Feet supported;Bilateral upper extremity supported Sitting balance-Leahy Scale: Fair Sitting balance - Comments: min guard assist EOB   Standing balance support: Bilateral upper extremity supported;During functional activity Standing balance-Leahy Scale: Poor Standing balance comment: reliant on external support and use of Stedy  Cognition Arousal/Alertness: Awake/alert Behavior During Therapy: Flat affect Overall Cognitive Status: Impaired/Different from baseline Area of Impairment: Attention;Memory;Safety/judgement;Problem solving;Awareness;Orientation                 Orientation Level: Person;Place;Time;Situation(states she is 14, states it's october 1920) Current Attention Level: Sustained Memory: Decreased short-term memory   Safety/Judgement: Decreased  awareness of deficits Awareness: Intellectual Problem Solving: Slow processing;Decreased initiation;Difficulty sequencing;Requires verbal cues General Comments: Had difficulty following commands with poor motor planning  Poor safety awareness as well.       Exercises General Exercises - Lower Extremity Long Arc Quad: AROM;Both;10 reps;Seated    General Comments General comments (skin integrity, edema, etc.): Pt on 3LO2 with VSS      Pertinent Vitals/Pain Pain Assessment: 0-10 Faces Pain Scale: Hurts even more Pain Location: R foot Pain Descriptors / Indicators: Aching;Sore Pain Intervention(s): Limited activity within patient's tolerance;Monitored during session;Repositioned;Premedicated before session    Home Living                      Prior Function            PT Goals (current goals can now be found in the care plan section) Acute Rehab PT Goals Patient Stated Goal: go to the [facility] Progress towards PT goals: Progressing toward goals    Frequency    Min 4X/week      PT Plan Current plan remains appropriate    Co-evaluation              AM-PAC PT "6 Clicks" Mobility   Outcome Measure  Help needed turning from your back to your side while in a flat bed without using bedrails?: A Little Help needed moving from lying on your back to sitting on the side of a flat bed without using bedrails?: A Little Help needed moving to and from a bed to a chair (including a wheelchair)?: A Little Help needed standing up from a chair using your arms (e.g., wheelchair or bedside chair)?: A Little Help needed to walk in hospital room?: A Lot Help needed climbing 3-5 steps with a railing? : Total 6 Click Score: 15    End of Session Equipment Utilized During Treatment: Gait belt;Oxygen Activity Tolerance: Patient limited by pain;Patient limited by fatigue Patient left: with call bell/phone within reach;in chair;with chair alarm set Nurse Communication: Mobility  status PT Visit Diagnosis: Other abnormalities of gait and mobility (R26.89);Pain;Muscle weakness (generalized) (M62.81) Pain - Right/Left: Right Pain - part of body: Ankle and joints of foot     Time: 0940-1006 PT Time Calculation (min) (ACUTE ONLY): 26 min  Charges:  $Therapeutic Activity: 23-37 mins                     [place] Pager:  [phone]  Office:  [place] 08/22/2019, 12:52 PM

## 2019-08-22 NOTE — Progress Notes (Signed)
[facility] Gastroenterology Progress Note  Briana Andrews 53 y.o. [birth year]  CC:  alcoholic hepatitis with encephalopathy, LV thrombus, stroke   Subjective: Sitting comfortably in the recliner.  Small amount of melanotic stool noted in the rectal tube.  Denies abdominal pain, nausea or vomiting  ROS : Afebrile. negative for acute chest pain.   Objective: Vital signs in last 24 hours: Vitals:   08/22/19 1100 08/22/19 1200  BP: 128/83 130/70  Pulse:    Resp: 20 17  Temp:    SpO2: 98% 100%    Physical Exam:  General:  Alert,no distress, appears stated age  Head:  Normocephalic, without obvious abnormality, atraumatic  Eyes:   Scleral icterus noted  Lungs:    Bibasilar rales.  Anterior exam only  Heart:  Regular rate and rhythm, S1, S2 normal  Abdomen:    Abdomen mildly distended, NT,, bowel sounds present.  No peritoneal sign  Neuro.  alert and oriented       Lab Results: Recent Labs    08/20/19 0141 08/21/19 0340 08/22/19 0515  NA 136 138 135  K 3.7 3.7 3.5  CL 105 109 107  CO2 25 19* 19*  GLUCOSE 92 119* 103*  BUN 67* 82* 76*  CREATININE 1.51* 1.84* 1.78*  CALCIUM 7.5* 7.3* 7.3*  MG 1.6*  --  2.0  PHOS 3.5  --  4.3   Recent Labs    08/21/19 0340 08/22/19 0515  AST 38 41  ALT 31 29  ALKPHOS 66 65  BILITOT 6.2* 5.7*  PROT 4.1* 4.4*  ALBUMIN 1.1* 1.1*   Recent Labs    08/22/19 0515 08/22/19 1125  WBC 15.2* 15.3*  HGB 7.5* 9.4*  HCT 22.1* 26.8*  MCV 95.3 94.0  PLT 148* 136*   Recent Labs    08/20/19 0141  LABPROT 20.0*  INR 1.7*      Assessment/Plan: -Jaundice.  Significantly elevated T bili at 12.7 with mild elevated AST at 59.  Normal ALT and alkaline phosphatase.  CT scan showed normal-appearing liver and pancreas.  No biliary dilatation.  No gallstones or gall bladder wall thickening.  Most likely from alcoholic hepatitis. -Altered mental status.  MRI concerning for recent stroke. -Newly diagnosed biventricular CHF with EF of only 15 to  20% with large fixed thrombus on apical septum. -Shock.  Combined cardiogenic and septic. -Lower limb ischemia.  Status post thrombectomy. -Melena with acute blood loss anemia.  Recommendations ---------------------------- -Continue Protonix drip and octreotide drip for additional 24 to 48 hours.  -LFTs improving.  Very little melanotic stool in the rectal tube.  -Patient is very high risk for endoscopic intervention.  If patient starts having variceal bleeding while attempting band ligation, there is high chance of mortality.  -Very poor prognosis   Otis Brace MD, FACP 08/22/2019, 12:49 PM  Contact #  [phone]

## 2019-08-23 ENCOUNTER — Inpatient Hospital Stay (HOSPITAL_COMMUNITY): Payer: 59

## 2019-08-23 DIAGNOSIS — K7200 Acute and subacute hepatic failure without coma: Secondary | ICD-10-CM

## 2019-08-23 DIAGNOSIS — I6310 Cerebral infarction due to embolism of unspecified precerebral artery: Secondary | ICD-10-CM

## 2019-08-23 DIAGNOSIS — K922 Gastrointestinal hemorrhage, unspecified: Secondary | ICD-10-CM

## 2019-08-23 DIAGNOSIS — L8990 Pressure ulcer of unspecified site, unspecified stage: Secondary | ICD-10-CM | POA: Diagnosis not present

## 2019-08-23 LAB — CBC
HCT: 24.1 % — ABNORMAL LOW (ref 36.0–46.0)
Hemoglobin: 8.5 g/dL — ABNORMAL LOW (ref 12.0–15.0)
MCH: 32.7 pg (ref 26.0–34.0)
MCHC: 35.3 g/dL (ref 30.0–36.0)
MCV: 92.7 fL (ref 80.0–100.0)
Platelets: 152 10*3/uL (ref 150–400)
RBC: 2.6 MIL/uL — ABNORMAL LOW (ref 3.87–5.11)
RDW: 21.2 % — ABNORMAL HIGH (ref 11.5–15.5)
WBC: 11.7 10*3/uL — ABNORMAL HIGH (ref 4.0–10.5)
nRBC: 0.2 % (ref 0.0–0.2)

## 2019-08-23 LAB — CALCIUM, IONIZED: Calcium, Ionized, Serum: 4.1 mg/dL — ABNORMAL LOW (ref 4.5–5.6)

## 2019-08-23 LAB — COOXEMETRY PANEL
Carboxyhemoglobin: 2 % — ABNORMAL HIGH (ref 0.5–1.5)
Methemoglobin: 1 % (ref 0.0–1.5)
O2 Saturation: 79.2 %
Total hemoglobin: 9.3 g/dL — ABNORMAL LOW (ref 12.0–16.0)

## 2019-08-23 LAB — COMPREHENSIVE METABOLIC PANEL
ALT: 32 U/L (ref 0–44)
AST: 47 U/L — ABNORMAL HIGH (ref 15–41)
Albumin: 1.2 g/dL — ABNORMAL LOW (ref 3.5–5.0)
Alkaline Phosphatase: 54 U/L (ref 38–126)
Anion gap: 9 (ref 5–15)
BUN: 61 mg/dL — ABNORMAL HIGH (ref 6–20)
CO2: 20 mmol/L — ABNORMAL LOW (ref 22–32)
Calcium: 7.6 mg/dL — ABNORMAL LOW (ref 8.9–10.3)
Chloride: 107 mmol/L (ref 98–111)
Creatinine, Ser: 1.5 mg/dL — ABNORMAL HIGH (ref 0.44–1.00)
GFR calc Af Amer: 45 mL/min — ABNORMAL LOW (ref >60)
GFR calc non Af Amer: 39 mL/min — ABNORMAL LOW (ref >60)
Glucose, Bld: 100 mg/dL — ABNORMAL HIGH (ref 70–99)
Potassium: 3 mmol/L — ABNORMAL LOW (ref 3.5–5.1)
Sodium: 136 mmol/L (ref 135–145)
Total Bilirubin: 5.7 mg/dL — ABNORMAL HIGH (ref 0.3–1.2)
Total Protein: 4.6 g/dL — ABNORMAL LOW (ref 6.5–8.1)

## 2019-08-23 LAB — HEMOGLOBIN AND HEMATOCRIT, BLOOD
HCT: 25.9 % — ABNORMAL LOW (ref 36.0–46.0)
HCT: 25 % — ABNORMAL LOW (ref 36.0–46.0)
Hemoglobin: 9.1 g/dL — ABNORMAL LOW (ref 12.0–15.0)
Hemoglobin: 8.8 g/dL — ABNORMAL LOW (ref 12.0–15.0)

## 2019-08-23 LAB — HEPARIN LEVEL (UNFRACTIONATED)
Heparin Unfractionated: 0.21 IU/mL — ABNORMAL LOW (ref 0.30–0.70)
Heparin Unfractionated: 0.32 IU/mL (ref 0.30–0.70)

## 2019-08-23 LAB — MAGNESIUM: Magnesium: 1.7 mg/dL (ref 1.7–2.4)

## 2019-08-23 LAB — POTASSIUM: Potassium: 3.7 mmol/L (ref 3.5–5.1)

## 2019-08-23 MED ORDER — FUROSEMIDE 10 MG/ML IJ SOLN
40.0000 mg | Freq: Once | INTRAMUSCULAR | Status: AC
  Administered 2019-08-23: 13:00:00 40 mg via INTRAVENOUS
  Filled 2019-08-23: qty 4

## 2019-08-23 MED ORDER — POTASSIUM CHLORIDE 10 MEQ/50ML IV SOLN
10.0000 meq | Freq: Once | INTRAVENOUS | Status: AC
  Administered 2019-08-23: 13:00:00 10 meq via INTRAVENOUS
  Filled 2019-08-23: qty 50

## 2019-08-23 MED ORDER — POTASSIUM CHLORIDE 10 MEQ/50ML IV SOLN
10.0000 meq | INTRAVENOUS | Status: AC
  Administered 2019-08-23 (×5): 10 meq via INTRAVENOUS
  Filled 2019-08-23 (×5): qty 50

## 2019-08-23 MED ORDER — DOBUTAMINE IN D5W 4-5 MG/ML-% IV SOLN
1.5000 ug/kg/min | INTRAVENOUS | Status: DC
  Administered 2019-08-26: 2.5 ug/kg/min via INTRAVENOUS
  Filled 2019-08-23: qty 250

## 2019-08-23 NOTE — Progress Notes (Signed)
Patient ID: Briana Andrews, female   DOB: 03-24-1966, 53 y.o.   MRN: [id number]     Advanced Heart Failure Rounding Note  PCP-Cardiologist: Tobias Alexander, MD   Subjective:    Remains on dobutamine 2.5 mcg. Co-ox 79%  Sitting in chair. Lethargic. Unable to tell me where she is.  C/o pain all over.   Profuse melena in rectal bag.  hgb 9/1-> 8.5   Creatinine  improved. CVP 10   Objective:   Weight Range: 81.6 kg Body mass index is 29.94 kg/m.   Vital Signs:   Temp:  [97.5 F (36.4 C)-98.3 F (36.8 C)] 97.8 F (36.6 C) (09/26 0757) Pulse Rate:  [71-82] 71 (09/26 1026) Resp:  [11-22] 22 (09/26 0900) BP: (98-130)/(54-83) 107/70 (09/26 0900) SpO2:  [83 %-100 %] 95 % (09/26 0900) Weight:  [81.6 kg] 81.6 kg (09/26 0500) Last BM Date: 08/22/19  Weight change: Filed Weights   08/21/19 0431 08/22/19 0415 08/23/19 0500  Weight: 80.3 kg 81.4 kg 81.6 kg    Intake/Output:   Intake/Output Summary (Last 24 hours) at 08/23/2019 1048 Last data filed at 08/23/2019 0700 Gross per 24 hour  Intake 1453.35 ml  Output 1700 ml  Net -246.65 ml      Physical Exam    General:  Sitting in chair. Jaundiced. Critically ill appearing. LethargicNo resp difficulty HEENT: normal + scleral icterus Neck: supple. RIJ TLC JVP 10 Carotids 2+ bilat; no bruits. No lymphadenopathy or thryomegaly appreciated. Cor: PMI nondisplaced. Regular rate & rhythm. No rubs, gallops or murmurs. Lungs: clear Abdomen: soft, mildly tender, + distended. No hepatosplenomegaly. No bruits or masses. Good bowel sounds. Extremities: no cyanosis, clubbing, rash, 2+ edema B LE dressings with wounds/sloughing on R foot Neuro: awake oriented x 1 cranial nerves grossly intact. moves all 4 extremities w/o difficulty. + mild asterixis   Telemetry   NSR 70-80s personally reviewed.   Labs    CBC Recent Labs    08/22/19 1125 08/22/19 1946 08/23/19 0348  WBC 15.3*  --  11.7*  HGB 9.4* 9.1* 8.5*  HCT 26.8* 25.5* 24.1*   MCV 94.0  --  92.7  PLT 136*  --  152   Basic Metabolic Panel Recent Labs    51/70/01 0515 08/23/19 0348  NA 135 136  K 3.5 3.0*  CL 107 107  CO2 19* 20*  GLUCOSE 103* 100*  BUN 76* 61*  CREATININE 1.78* 1.50*  CALCIUM 7.3* 7.6*  MG 2.0  --   PHOS 4.3  --    Liver Function Tests Recent Labs    08/22/19 0515 08/23/19 0348  AST 41 47*  ALT 29 32  ALKPHOS 65 54  BILITOT 5.7* 5.7*  PROT 4.4* 4.6*  ALBUMIN 1.1* 1.2*   No results for input(s): LIPASE, AMYLASE in the last 72 hours. Cardiac Enzymes No results for input(s): CKTOTAL, CKMB, CKMBINDEX, TROPONINI in the last 72 hours.  BNP: BNP (last 3 results) No results for input(s): BNP in the last 8760 hours.  ProBNP (last 3 results) No results for input(s): PROBNP in the last 8760 hours.   D-Dimer No results for input(s): DDIMER in the last 72 hours. Hemoglobin A1C No results for input(s): HGBA1C in the last 72 hours. Fasting Lipid Panel No results for input(s): CHOL, HDL, LDLCALC, TRIG, CHOLHDL, LDLDIRECT in the last 72 hours. Thyroid Function Tests No results for input(s): TSH, T4TOTAL, T3FREE, THYROIDAB in the last 72 hours.  Invalid input(s): FREET3  Other results:   Imaging  Dg Chest Port 1 View  Result Date: 08/23/2019 CLINICAL DATA:  Shortness of breath. EXAM: PORTABLE CHEST 1 VIEW COMPARISON:  Radiograph of August 22, 2019. FINDINGS: Stable cardiomegaly. Right internal jugular catheter tip is seen in expected position of the SVC. No pneumothorax is noted. Continued presence of moderate to large right pleural effusion is noted with associated atelectasis or infiltrate. Stable left basilar atelectasis or infiltrate is noted as well. Bony thorax is unremarkable. IMPRESSION: Stable right pleural effusion. Stable bibasilar atelectasis or infiltrates. Electronically Signed   By: Marijo Conception M.D.   On: 08/23/2019 08:36   Dg Chest Port 1 View  Result Date: 08/22/2019 CLINICAL DATA:  Encounter for  central line placement EXAM: PORTABLE CHEST 1 VIEW COMPARISON:  08/20/2019 FINDINGS: RIGHT central venous line with tip in the proximal SVC. This a change in position from the distal SVC on comparison exam 2 days prior. Stable large cardiac silhouette. Moderate to large RIGHT effusion again noted. No pneumothorax. Lung bases poorly evaluated IMPRESSION: 1. RIGHT central venous line now positioned with the tip in the most proximal SVC. 2. Moderate RIGHT pleural effusion unchanged. Electronically Signed   By: Suzy Bouchard M.D.   On: 08/22/2019 18:23     Medications:     Scheduled Medications: . chlorhexidine  15 mL Mouth/Throat BID  . Chlorhexidine Gluconate Cloth  6 each Topical Daily  . digoxin  0.125 mg Oral Daily  . folic acid  1 mg Oral Daily  . lactulose  10 g Oral BID  . multivitamin with minerals  1 tablet Oral Daily  . nicotine  21 mg Transdermal Daily  . [START ON 08/24/2019] pantoprazole  40 mg Intravenous Q12H  . potassium chloride  20 mEq Oral BID  . rifaximin  550 mg Oral BID  . sodium chloride flush  10 mL Intravenous Q12H  . sodium chloride flush  10 mL Intravenous Q12H  . thiamine  100 mg Oral Daily    Infusions: . sodium chloride Stopped (08/20/19 1943)  . heparin 1,200 Units/hr (08/23/19 0848)  . octreotide  (SANDOSTATIN)    IV infusion 50 mcg/hr (08/23/19 0700)  . potassium chloride 10 mEq (08/23/19 1032)    PRN Medications: sodium chloride, acetaminophen, albuterol, dextromethorphan-guaiFENesin, hydrALAZINE, labetalol, ondansetron (ZOFRAN) IV, ondansetron **OR** [DISCONTINUED] ondansetron (ZOFRAN) IV, oxyCODONE, sodium chloride flush, sodium chloride flush, sodium chloride flush   Assessment/Plan   1. Acute on chronic systolic CHF/cardiogenic shock: Echo with EF 15-20%, mild LV dilation, LV thrombus, mild-moderate MR, severely decreased RV systolic function.   Cause of cardiomyopathy is uncertain, but she was a heavy smoker and ECG is suggestive of possible  old ASMI. Ischemic cardiomyopathy is possible, alternatively could be due to ETOH abuse (>6 pack/day per husband).  Lactate was elevated 4 days ago at admission.  Suspect cardiogenic shock with low BP, cannot rule out component of septic shock. Currently on dobutamine 2.5 with stable co-ox (48% pre-dobutamine, 79% this morning) and stable MAP.   - CVP 10 today. Will give one dose IV lasix - Continue dobutamine at 2.5 and digoxin - Per Dr. Aundra Dubin, she would ideally have right/left heart cath if creatinine stable and GI bleeding stops. That said, currently with diffuse melena and encephalopathy so will need to re-evaluate. May also be limited as she likely is not candidate for PCI due to inability to give ASA/DAPT - Not candidate for advanced therapies - CCM and GI recommending [facility].  - Would hold off on mechanical support given lack  of good end point (not a candidate for advanced cardiac therapies).  2. 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 Markedly elevated bilirubin with jaundice.  However liver imaging with [place] and CT has NOT shown cirrhosis or portal/hepatic vein thrombosis. Question has arisen whether this might possibly be due to primarily to RV failure.  Time course seems quite acute for RV failure.  - holding stain and other hepato-toxic agents  3. Encephalopathy: ?Cause.  NH3 has not been markedly high but certainly could be hepatic encephalopathy.  Shock likely plays a role.  - Worse today oriented x 1. Mild asterixis on exam - She is on lactulose and rifaximin.  4. AKI: Creatinine improved to 1.5 with inotropic support.  Suspect cardiorenal syndrome, also had contrast with LE angiograms so possible component of contrast nephropathy.  - Support BP and cardiac output.  5. LV thrombus: Possible cause of embolic event to popliteal arteries.  Heparin gtt held with active GI bleeding. - Heparin drip restarted 9/24  - Now with ongoing melena - D/w CCM. No good options  here. Per GI not candidate for scope at this point 6. PAD: Bilateral popliteal artery occlusions, ?cardioembolic from LV thrombus.  Now s/p mechanical thrombectomy by vascular.  7. 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  Treatment will be anticoagulation.  8. Right popliteal vein DVT: Heparin gtt restarted 9/24 9. GI bleeding: Diffuse melena in rectal bag today.  Hgb down to 5.9, 2 unit transfusion 9/23 and now 7.5. Transfused again. Now hgb dropping again in setting of heparin for LV thrombus and embolic disease. Not on asa with bleeding.  - Protonix gtt and octreotide gtt per GI.  - Follow H/H 10. ETOH abuse: Per husband, at least a 6 pack daily.  11. ID: Concern for aspiration PNA. - Continue ceftriaxone.  12. DNR  She remains critically ill with mult-system organ failure. Suspect CM likely due to critical illness in setting of probable ESLD and MSOF but may be ischemic in nature. With ongoing bleeding options quite limited.   Will continue dobutamine and diuresis for now. Defer decisions on invasive w/u to Dr. Shirlee Latch.  CRITICAL CARE Performed by: Arvilla Meres  Total critical care time: 40 minutes  Critical care time was exclusive of separately billable procedures and treating other patients.  Critical care was necessary to treat or prevent imminent or life-threatening deterioration.  Critical care was time spent personally by me (independent of midlevel providers or residents) on the following activities: development of treatment plan with patient and/or surrogate as well as nursing, discussions with consultants, evaluation of patient's response to treatment, examination of patient, obtaining history from patient or surrogate, ordering and performing treatments and interventions, ordering and review of laboratory studies, ordering and review of radiographic studies, pulse oximetry and re-evaluation of patient's condition.    Length of Stay: 7  Arvilla Meres, MD  08/23/2019, 10:48 AM  Advanced Heart Failure Team Pager [phone] (M-F; 7a - 4p)  Please contact [facility] for night-coverage after hours (4p -7a ) and weekends on [email or web address]

## 2019-08-23 NOTE — Progress Notes (Signed)
`  NAME:  Briana Andrews, MRN:  [id number], DOB:  Feb 03, 1966, LOS: 7 ADMISSION DATE:  08/15/2019, CONSULTATION DATE: 9/23 REFERRING MD:  Jearld Pies, CHIEF COMPLAINT:  Post-op shock   Brief History   53 yof admitted 9/19 w/ AMS and lethargy. 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 She was seen by multiple sub specialists including: Neuro, cardiology, vascular surgery and GI. She underwent subsequent mechanical thrombectomy of the lower extremities (the right on 9/22 and the left on 9/23). PCCM asked to see on 9/23 as she was hypotensive   History of present illness   53 year old white female admitted 9/19 w/ cc: confusion and weakness progressing acutely over about a weeks time.   In ER found to have: elevated LFTs, elevated LA, pcxr w/ multi-focal airspace disease concerning for PNA,  neuro was consulted after an MRI of brain showed subacute right frontal ACA territory infarct and small right caudate and cerebellar infarcts.  As part of the work up an echo was obtained. This showed: new CM EF 15-20%  W/ large fixed LV thrombus and was started on [facility] and cardiology was consulted. She was also seen by vascular surg w/ concern for ischemic LEs. US showed bilateral total popliteal artery occlusion w/ DVT on right.  GI was consulted for liver failure, heme + stool and dropping hgb.  She went to the OR on 9/22 for thrombectomy of the RLE and  9/23 for left LE thrombectomy of the left post tib and popliteal artery.  She returned to the med-surg floor hypotensive and more confused which was why PCCM was consulted.   Past Medical History  ETOH and tobacco abuse.   Significant Hospital Events   9/19 admitted. MRI  subacute right frontal ACA territory infarct and small right caudate and cerebellar infarcts.  As part of the work up an  echo was obtained. 9/20 ECHO showed: new CM EF 15-20%  W/ large fixed LV thrombus and was started on [facility] and cardiology was consulted. She was also seen by vascular surg w/ concern for ischemic LEs. US showed bilateral total popliteal artery occlusion w/ DVT on right.  GI was consulted for liver failure, heme + stool and dropping hgb.  9/22 for thrombectomy of the RLE   9/23 for left LE thrombectomy of the left post tib and popliteal artery. She returned to the med-surg floor hypotensive and more confused which was why PCCM was consulted. Also having melanotic stool w/ hgb drop so GI starting octreotide and ppi gtt. Transferred to ICU, DNR order placed. Palliative consulted-- no escalation of care  9/24> Improved mentation. Heparin resumed. Continues on dobutamine 9/25> continues on dobutamine 9/26: Remains stable renal function a little improved.  Mental status a little improved. Consults:  Neuro 9/19 Cardiology 9/19 Vascular surgey 9/19 GI  PCCM 9/23 Palliative Care Medicine 9/23   Procedures:  9/22 Mechanical thrombectomy of the right superficial femoral, popliteal, and posterior tibial artery using the penumbra CAT 6 device 9/23 Mechanical thrombectomy of the left popliteal and posterior tibial artery using the Penumbra CAT 6 device Significant Diagnostic Tests:  Echo 9/19: Left Ventricle: Left ventricular ejection fraction, by visual estimation, is 20%. The left ventricle has severely decreased function. There is a large, fixed, apical left ventricular thrombus. Global RV systolic function is has severely reduced systolic  function Mri brain 9/19: 1. Gyriform post ischemic appearing enhancement in the right ACA territory, corresponding to most confluent diffusion abnormality earlier today. 2. Faint linear enhancement in the left frontal lobe white matter appears to reflect an area of late subacute ischemia. CT abd nml pancreas, no biliary dilation, no gallstones.   9/24 Vascular US>  RLe moderate arterial disease. LLE moderate arterial disease.   Micro Data:  9/19 SARS cov2> neg 9/19 RVP> neg 9/19 BCx> NGTD  Antimicrobials:  levaquin 9/21>>> 9/23 Ceftriaxone 9/23>> 9/26  Interim history/subjective:  Hemodynamics stable Up in chair Objective   Blood pressure 112/69, pulse 80, temperature 97.8 F (36.6 C), temperature source Oral, resp. rate (Abnormal) 21, height 5\' 5"  (1.651 m), weight 81.6 kg, SpO2 95 %. CVP:  [0 mmHg-15 mmHg] 0 mmHg      Intake/Output Summary (Last 24 hours) at 08/23/2019 0803 Last data filed at 08/23/2019 0700 Gross per 24 hour  Intake 1999.72 ml  Output 1700 ml  Net 299.72 ml   Filed Weights   08/21/19 0431 08/22/19 0415 08/23/19 0500  Weight: 80.3 kg 81.4 kg 81.6 kg    Examination: General this is a 53 year old white female she is sitting up in bed.  She remains in no acute distress does complain of some back discomfort from the chair she remains encephalopathic HEENT: Mucous membranes are dry, she has a right internal jugular triple-lumen catheter the dressing is intact there is no clear jugular venous distention her sclera are slightly icteric Pulmonary: Diminished right side no accessory use Cardiac: Regular rate and rhythm Abdomen: Soft, not tender.  Still has melenic stools through Flexi-Seal GU: Clear yellow Neuro: More awake, remains confused.  Weak generally but no focal weakness Extremities: No significant edema  Resolved Hospital Problem list     Assessment & Plan:    Cardiogenic shock 2/2 biventricular heart failure -EF 15-20% Confounding components of sepsis and hypovolemia (blood loss 2/2 GIB) -leukocytosis is globally downtrending and patient is afebrile  Plan Continuing inotropic support with dobutamine and digoxin, her SPO2 remains at 79% I would favor weaning dobutamine off  Keep euvolemic at this point  Continuing heparin  Heart failure team following, given her overall poor prognosis not sure there is  much utility for cardiac catheterization here but will defer to the heart failure team  We will continue telemetry monitoring in the intensive care for now   Large LV thrombus  Plan P Heparin per pharmacy Acute metabolic encephalopathy 2/2 shock, hepatic dysfunction, recent subacute CVA -At risk for EtOH withdrawal  Plan Continue lactulose and rifaximin Supportive care  Acute GIB Acute blood loss anemia, stable  -GIB etiology r/t EtOH hepatitis vs Hepatic dysfunction 2/2 RV failure  CBC has remained relatively stable on heparin Plan GI following, would transfuse for hemoglobin less than 7 Continue Protonix and octreotide Spoke with GI this morning, given her frail hemodynamic status and poor prognosis not sure it makes sense to consider endoscopic procedures If she were to acutely decline we would transition to comfort Initiating clear liquid diet  Bilateral pulmonary opacities with moderate size right effusion -PNA vs edema vs atelectasis Portable chest x-ray personally reviewed: Mild patchy interstitial changes on the left without significant change, the right side has significant pleural effusion with element of atelectasis.  Cannot exclude aspiration, however doubt full White blood cell count trending down Plan She is status post 7 days of antibiotics we will discontinue this We will hold off on thoracentesis given active anticoagulation and no  oxygen requirements We will add incentive spirometry   BLE arterial occlusion, s/p bilateral thrombectomy, it appears as though there is some right lower extremity ischemia Plan Continuing IV heparin Serial circulatory checks Additional recommendations per vascular surgery, the right lower extremity will continue to be monitored suspect it will demarcate further  AKI, improving  -likely multifactorial in setting of shock, IV contrast -Currently renal function improving Plan Continue to treat cardiac dysfunction Mean arterial  pressure goal greater than 65 Renal dose medications A.m. chemistry  Fluid and electrolyte imbalance: Hypokalemia mild Anion gap metabolic acidosis Plan Serial chemistries Replace potassium  Malnutrition in setting of EtOH use disorder Plan Starting clear liquids  Jaundice -Most likely etiology is alcoholic Hepatitis - Possible hepatic dysfunction related to RV dysfunction  Plan Intermittent LFT checks  Coagulopathy, mild, in setting of shock and liver dysfunction Plan Repeat INR with next lab check  Hyperglycemia Plan Sliding scale insulin    Goals of Care: We will continue current supportive measures.  I had a long discussion on initial consultation with the patient's husband.  We have seen some mild stabilization but we are in no way out of the woods, and I am doubtful that she will return to baseline functional status.  Ideally I would like to see [place] get her off inotropes and just monitor her supportively.  If things continue to stabilize with supportive measures then perhaps we could consider further diagnostics but until that point ongoing supportive care would be more consistent with our agreement. Plan Continue current level of support I would like to see inotropes weaned off Advancing diet to clear liquids If she continues to improve we will continue supportive care, however if she were to deteriorate we would transition to comfort I will not offer escalation   CRITICAL CARE  36 minutes

## 2019-08-23 NOTE — Progress Notes (Signed)
ANTICOAGULATION CONSULT NOTE - Follow Up Consult  Pharmacy Consult for Heparin Indication: DVT, LV thrombus, and acute CVA  Allergies  Allergen Reactions  . Penicillins Other (See Comments)    Unsure of reaction Did it involve swelling of the face/tongue/throat, SOB, or low BP? Unknown Did it involve sudden or severe rash/hives, skin peeling, or any reaction on the inside of your mouth or nose? Unknown Did you need to seek medical attention at a hospital or doctor's office? Unknown When did it last happen?[place] If all above answers are "NO", may proceed with cephalosporin use.    Patient Measurements: Height: 5\' 5"  (165.1 cm) Weight: 179 lb 7.3 oz (81.4 kg) IBW/kg (Calculated) : 57 Heparin Dosing Weight: 75 kg  Vital Signs: Temp: 98.3 F (36.8 C) (09/26 0000) Temp Source: Axillary (09/26 0000) BP: 120/58 (09/26 0400) Pulse Rate: 77 (09/26 0000)  Labs: Recent Labs    08/21/19 0340  08/22/19 0515 08/22/19 1125 08/22/19 1946 08/23/19 0348  HGB 8.4*   < > 7.5* 9.4* 9.1* 8.5*  HCT 24.5*   < > 22.1* 26.8* 25.5* 24.1*  PLT 120*  --  148* 136*  --  152  HEPARINUNFRC  --    < > 0.46 0.60  --  0.21*  CREATININE 1.84*  --  1.78*  --   --   --    < > = values in this interval not displayed.    Estimated Creatinine Clearance: 38.1 mL/min (A) (by C-G formula based on SCr of 1.78 mg/dL (H)).   Medications:  Scheduled:  . chlorhexidine  15 mL Mouth/Throat BID  . Chlorhexidine Gluconate Cloth  6 each Topical Daily  . digoxin  0.125 mg Oral Daily  . folic acid  1 mg Oral Daily  . lactulose  10 g Oral BID  . multivitamin with minerals  1 tablet Oral Daily  . nicotine  21 mg Transdermal Daily  . [START ON 08/24/2019] pantoprazole  40 mg Intravenous Q12H  . potassium chloride  20 mEq Oral BID  . rifaximin  550 mg Oral BID  . sodium chloride flush  10 mL Intravenous Q12H  . sodium chloride flush  10 mL Intravenous Q12H  . thiamine  100 mg Oral Daily   Infusions:  .  sodium chloride Stopped (08/20/19 1943)  . cefTRIAXone (ROCEPHIN)  IV Stopped (08/22/19 2040)  . DOBUTamine 2.5 mcg/kg/min (08/23/19 0000)  . heparin 1,150 Units/hr (08/23/19 0000)  . octreotide  (SANDOSTATIN)    IV infusion 50 mcg/hr (08/23/19 0000)  . pantoprozole (PROTONIX) infusion 8 mg/hr (08/23/19 0000)    Assessment: [name] on heparin for LV thrombus and bilateral lower extremity ischemia w/ right popliteal vein DVT now s/p angioplasty and R/L popliteal thrombectomy of the tibial artery. Heparin therapy complicated by acute CVA and recent GI bleed and need for lower goal. Plans noted for coumadin when no further procedures  She is s/p mech thrombectomy of L popliteal 9/23. Heparin stopped 09/23 @ 1750 due to a GI bleed. Pt was started on octreotide and IV protonix. GI bleeding does not appear to be worse with black tarry stools resolving slowly. Hgb 7.5 this am repeat 1unit PRBC Heparin level 0.6 upper end of goal on heparin drip 1300 units/hr Heparin infusing in Midline and HL drawn from CL - should be accurate  9/26 AM update: Heparin level below goal this AM Heparin was off for a little while on day shift yesterday due to line issues  Goal of Therapy:  Heparin level 0.3-0.5 units/ml Monitor platelets by anticoagulation protocol: Yes   Plan:  Inc heparin to 1200 units/hr 1200 heparin level  Narda Bonds, PharmD, BCPS Clinical Pharmacist Phone: [phone]

## 2019-08-23 NOTE — Progress Notes (Signed)
Potassium 3.0. Patient is also having runs of PVCs. RN reported to [employer]. Orders for potassium received. See MAR.

## 2019-08-23 NOTE — Progress Notes (Signed)
Subjective: Patient is sitting on a bedside chair, is awake but confused.  Rectal bag contains liquid melanotic stool.  Objective: Vital signs in last 24 hours: Temp:  [97.3 F (36.3 C)-98.3 F (36.8 C)] 97.8 F (36.6 C) (09/26 0757) Pulse Rate:  [77-82] 80 (09/26 0400) Resp:  [11-22] 20 (09/26 0800) BP: (97-130)/(54-83) 98/81 (09/26 0800) SpO2:  [83 %-100 %] 96 % (09/26 0800) Weight:  [81.6 kg] 81.6 kg (09/26 0500) Weight change: 0.2 kg Last BM Date: 08/22/19  PE: Appears icteric and pale GENERAL: Awake, follows commands but is not oriented to time/place or person She can tell me her name and date of birth  ABDOMEN: Soft, nondistended EXTREMITIES: Ischemic limbs  Lab Results: Results for orders placed or performed during the hospital encounter of 08/15/19 (from the past 48 hour(s))  Hemoglobin and hematocrit, blood     Status: Abnormal   Collection Time: 08/21/19 12:00 PM  Result Value Ref Range   Hemoglobin 7.8 (L) 12.0 - 15.0 g/dL   HCT 21.7 (L) 36.0 - 46.0 %    Comment: Performed at [facility], [address], [place], [place] [address]  Hemoglobin and hematocrit, blood     Status: Abnormal   Collection Time: 08/21/19  7:39 PM  Result Value Ref Range   Hemoglobin 7.5 (L) 12.0 - 15.0 g/dL   HCT 21.9 (L) 36.0 - 46.0 %    Comment: Performed at [facility], [place] [address], [place], [place] [address]  Heparin level (unfractionated)     Status: Abnormal   Collection Time: 08/21/19  7:39 PM  Result Value Ref Range   Heparin Unfractionated 1.42 (H) 0.30 - 0.70 IU/mL    Comment: RESULTS CONFIRMED BY MANUAL DILUTION Performed at [facility], [place] [address], [place], [place] [address]   Heparin level (unfractionated)     Status: Abnormal   Collection Time: 08/21/19 10:12 PM  Result Value Ref Range   Heparin Unfractionated 0.20 (L) 0.30 - 0.70 IU/mL    Comment: (NOTE) If heparin results are below expected values, and patient dosage has  been confirmed,  suggest follow up testing of antithrombin III levels. Performed at [facility], [place] [address] [place], [place] [address]   CBC     Status: Abnormal   Collection Time: 08/22/19  5:15 AM  Result Value Ref Range   WBC 15.2 (H) 4.0 - 10.5 K/uL   RBC 2.32 (L) 3.87 - 5.11 MIL/uL   Hemoglobin 7.5 (L) 12.0 - 15.0 g/dL   HCT 22.1 (L) 36.0 - 46.0 %   MCV 95.3 80.0 - 100.0 fL   MCH 32.3 26.0 - 34.0 pg   MCHC 33.9 30.0 - 36.0 g/dL   RDW 21.8 (H) 11.5 - 15.5 %   Platelets 148 (L) 150 - 400 K/uL   nRBC 0.1 0.0 - 0.2 %    Comment: Performed at [facility] [address], [place], [place] [address]  .Cooxemetry Panel (carboxy, met, total hgb, O2 sat)     Status: Abnormal   Collection Time: 08/22/19  5:15 AM  Result Value Ref Range   Total hemoglobin 7.7 (L) 12.0 - 16.0 g/dL   O2 Saturation 83.3 %   Carboxyhemoglobin 1.6 (H) 0.5 - 1.5 %   Methemoglobin 0.8 0.0 - 1.5 %  Magnesium     Status: None   Collection Time: 08/22/19  5:15 AM  Result Value Ref Range   Magnesium 2.0 1.7 - 2.4  mg/dL    Comment: Performed at [facility], [place] [address] [place], [place] [address]  Digoxin level     Status: Abnormal   Collection Time: 08/22/19  5:15 AM  Result Value Ref Range   Digoxin Level 0.4 (L) 0.8 - 2.0 ng/mL    Comment: Performed at [facility], [place] [address], [place], [place] [address]  Comprehensive metabolic panel     Status: Abnormal   Collection Time: 08/22/19  5:15 AM  Result Value Ref Range   Sodium 135 135 - 145 mmol/L   Potassium 3.5 3.5 - 5.1 mmol/L   Chloride 107 98 - 111 mmol/L   CO2 19 (L) 22 - 32 mmol/L   Glucose, Bld 103 (H) 70 - 99 mg/dL   BUN 76 (H) 6 - 20 mg/dL   Creatinine, Ser 1.78 (H) 0.44 - 1.00 mg/dL   Calcium 7.3 (L) 8.9 - 10.3 mg/dL   Total Protein 4.4 (L) 6.5 - 8.1 g/dL   Albumin 1.1 (L) 3.5 - 5.0 g/dL   AST 41 15 - 41 U/L   ALT 29 0 - 44 U/L   Alkaline Phosphatase 65 38 - 126 U/L   Total Bilirubin 5.7 (H) 0.3 - 1.2 mg/dL   GFR calc  non Af Amer 32 (L) >60 mL/min   GFR calc Af Amer 37 (L) >60 mL/min   Anion gap 9 5 - 15    Comment: Performed at [facility], [place] [address] [place], [place] [address]  Phosphorus     Status: None   Collection Time: 08/22/19  5:15 AM  Result Value Ref Range   Phosphorus 4.3 2.5 - 4.6 mg/dL    Comment: Performed at [place] [address] [place], [place] [address]  Heparin level (unfractionated)     Status: None   Collection Time: 08/22/19  5:15 AM  Result Value Ref Range   Heparin Unfractionated 0.46 0.30 - 0.70 IU/mL    Comment: (NOTE) If heparin results are below expected values, and patient dosage has  been confirmed, suggest follow up testing of antithrombin III levels. Performed at [facility], [place] [address] [place], [place] [address]   Prepare RBC     Status: None   Collection Time: 08/22/19  7:39 AM  Result Value Ref Range   Order Confirmation      ORDER PROCESSED BY BLOOD BANK Performed at [facility], [place] [address] [place], [place] [address]   Heparin level (unfractionated)     Status: None   Collection Time: 08/22/19 11:25 AM  Result Value Ref Range   Heparin Unfractionated 0.60 0.30 - 0.70 IU/mL    Comment: (NOTE) If heparin results are below expected values, and patient dosage has  been confirmed, suggest follow up testing of antithrombin III levels. Performed at [facility], [place] [address], [place], [place] [address]   CBC     Status: Abnormal   Collection Time: 08/22/19 11:25 AM  Result Value Ref Range   WBC 15.3 (H) 4.0 - 10.5 K/uL   RBC 2.85 (L) 3.87 - 5.11 MIL/uL   Hemoglobin 9.4 (L) 12.0 - 15.0 g/dL    Comment: REPEATED TO VERIFY POST TRANSFUSION SPECIMEN    HCT 26.8 (L) 36.0 - 46.0 %   MCV 94.0 80.0 - 100.0 fL   MCH 33.0 26.0 - 34.0 pg   MCHC 35.1 30.0 - 36.0 g/dL   RDW 20.4 (H) 11.5 -  15.5 %   Platelets 136 (L) 150 - 400 K/uL   nRBC 0.2 0.0 - 0.2 %    Comment: Performed at [facility],  [place] [address], [place], [place] [address]  MRSA PCR Screening     Status: None   Collection Time: 08/22/19 11:36 AM   Specimen: Nasopharyngeal  Result Value Ref Range   MRSA by PCR NEGATIVE NEGATIVE    Comment:        The GeneXpert MRSA Assay (FDA approved for NASAL specimens only), is one component of a comprehensive MRSA colonization surveillance program. It is not intended to diagnose MRSA infection nor to guide or monitor treatment for MRSA infections. Performed at [facility], [place] [address] [place], [place] [address]   Hemoglobin and hematocrit, blood     Status: Abnormal   Collection Time: 08/22/19  7:46 PM  Result Value Ref Range   Hemoglobin 9.1 (L) 12.0 - 15.0 g/dL   HCT 25.5 (L) 36.0 - 46.0 %    Comment: Performed at [facility], [place] [address] [place], [place] [address]  CBC     Status: Abnormal   Collection Time: 08/23/19  3:48 AM  Result Value Ref Range   WBC 11.7 (H) 4.0 - 10.5 K/uL   RBC 2.60 (L) 3.87 - 5.11 MIL/uL   Hemoglobin 8.5 (L) 12.0 - 15.0 g/dL   HCT 24.1 (L) 36.0 - 46.0 %   MCV 92.7 80.0 - 100.0 fL   MCH 32.7 26.0 - 34.0 pg   MCHC 35.3 30.0 - 36.0 g/dL   RDW 21.2 (H) 11.5 - 15.5 %   Platelets 152 150 - 400 K/uL   nRBC 0.2 0.0 - 0.2 %    Comment: Performed at [facility], [place] [address], [place], [place] [address]  Heparin level (unfractionated)     Status: Abnormal   Collection Time: 08/23/19  3:48 AM  Result Value Ref Range   Heparin Unfractionated 0.21 (L) 0.30 - 0.70 IU/mL    Comment: (NOTE) If heparin results are below expected values, and patient dosage has  been confirmed, suggest follow up testing of antithrombin III levels. Performed at [facility], [place] [address] [place], [place] [address]   Comprehensive metabolic panel     Status: Abnormal   Collection Time: 08/23/19  3:48 AM  Result Value Ref Range   Sodium 136 135 - 145 mmol/L   Potassium 3.0 (L) 3.5 - 5.1 mmol/L   Chloride 107 98 - 111 mmol/L   CO2  20 (L) 22 - 32 mmol/L   Glucose, Bld 100 (H) 70 - 99 mg/dL   BUN 61 (H) 6 - 20 mg/dL   Creatinine, Ser 1.50 (H) 0.44 - 1.00 mg/dL   Calcium 7.6 (L) 8.9 - 10.3 mg/dL   Total Protein 4.6 (L) 6.5 - 8.1 g/dL   Albumin 1.2 (L) 3.5 - 5.0 g/dL   AST 47 (H) 15 - 41 U/L   ALT 32 0 - 44 U/L   Alkaline Phosphatase 54 38 - 126 U/L   Total Bilirubin 5.7 (H) 0.3 - 1.2 mg/dL   GFR calc non Af Amer 39 (L) >60 mL/min   GFR calc Af Amer 45 (L) >60 mL/min   Anion gap 9 5 - 15    Comment: Performed at [facility], [place] [address], [place],  [address]  .Cooxemetry Panel (carboxy, met, total hgb, O2 sat)     Status: Abnormal  Collection Time: 08/23/19  3:55 AM  Result Value Ref Range   Total hemoglobin 9.3 (L) 12.0 - 16.0 g/dL   O2 Saturation 79.2 %   Carboxyhemoglobin 2.0 (H) 0.5 - 1.5 %   Methemoglobin 1.0 0.0 - 1.5 %    Studies/Results: Dg Chest Port 1 View  Result Date: 08/23/2019 CLINICAL DATA:  Shortness of breath. EXAM: PORTABLE CHEST 1 VIEW COMPARISON:  Radiograph of August 22, 2019. FINDINGS: Stable cardiomegaly. Right internal jugular catheter tip is seen in expected position of the SVC. No pneumothorax is noted. Continued presence of moderate to large right pleural effusion is noted with associated atelectasis or infiltrate. Stable left basilar atelectasis or infiltrate is noted as well. Bony thorax is unremarkable. IMPRESSION: Stable right pleural effusion. Stable bibasilar atelectasis or infiltrates. Electronically Signed   By: Marijo Conception M.D.   On: 08/23/2019 08:36   Dg Chest Port 1 View  Result Date: 08/22/2019 CLINICAL DATA:  Encounter for central line placement EXAM: PORTABLE CHEST 1 VIEW COMPARISON:  08/20/2019 FINDINGS: RIGHT central venous line with tip in the proximal SVC. This a change in position from the distal SVC on comparison exam 2 days prior. Stable large cardiac silhouette. Moderate to large RIGHT effusion again noted. No pneumothorax. Lung bases poorly  evaluated IMPRESSION: 1. RIGHT central venous line now positioned with the tip in the most proximal SVC. 2. Moderate RIGHT pleural effusion unchanged. Electronically Signed   By: Suzy Bouchard M.D.   On: 08/22/2019 18:23   Vas [place] Burnard Bunting With/wo Tbi  Result Date: 08/21/2019 LOWER EXTREMITY DOPPLER STUDY Indications: Peripheral artery disease.  Vascular Interventions: 08/19/2019 - ABDOMINAL AORTOGRAM W/LOWER EXTREMITY                         PERIPHERAL VASCULAR BALLOON ANGIOPLASTY                          08/20/2019 - ABDOMINAL AORTOGRAM W/LOWER EXTREMITY                         PERIPHERAL VASCULAR INTERVENTION. Limitations: Today's exam was limited due to patient positioning, an open wound,              involuntary patient movement and patient pain tolerance. Comparison Study: No prior studies. Performing Technologist: Carlos Levering Rvt  Examination Guidelines: A complete evaluation includes at minimum, Doppler waveform signals and systolic blood pressure reading at the level of bilateral brachial, anterior tibial, and posterior tibial arteries, when vessel segments are accessible. Bilateral testing is considered an integral part of a complete examination. Photoelectric Plethysmograph (PPG) waveforms and toe systolic pressure readings are included as required and additional duplex testing as needed. Limited examinations for reoccurring indications may be performed as noted.  ABI Findings: +--------+------------------+-----+--------+--------+ Right   Rt Pressure (mmHg)IndexWaveformComment  +--------+------------------+-----+--------+--------+ Brachial                               PICC     +--------+------------------+-----+--------+--------+ PTA     63                0.59 biphasic         +--------+------------------+-----+--------+--------+ DP      47                0.44 biphasic         +--------+------------------+-----+--------+--------+  +--------+------------------+-----+---------+-------+  Left    Lt Pressure (mmHg)IndexWaveform Comment +--------+------------------+-----+---------+-------+ [id number]                    triphasic        +--------+------------------+-----+---------+-------+ PTA     59                0.56 biphasic         +--------+------------------+-----+---------+-------+ DP      55                0.52 biphasic         +--------+------------------+-----+---------+-------+ +-------+-----------+-----------+------------+------------+ ABI/TBIToday's ABIToday's TBIPrevious ABIPrevious TBI +-------+-----------+-----------+------------+------------+ Right  0.59                                           +-------+-----------+-----------+------------+------------+ Left   0.56                                           +-------+-----------+-----------+------------+------------+  Summary: Right: Resting right ankle-brachial index indicates moderate right lower extremity arterial disease. BP cuff was placed on the proximal calf due to wounds on the distal calf/ankle. Unable to obtain TBI due to patient movement, and pain tolerance. Left: Resting left ankle-brachial index indicates moderate left lower extremity arterial disease. Unable to obtain TBI due to patient movement, and pain tolerance.  *See table(s) above for measurements and observations.  Electronically signed by Monica Martinez MD on 08/21/2019 at 1:58:43 PM.    Final     Medications: I have reviewed the patient's current medications.  Assessment: Alcoholic hepatitis, encephalopathy, melena  Subacute right frontal ACA territory infarct, small right caudate and cerebellar infarct  EF 15 to 20% with large fixed left ventricular thrombus-remains on inotropic support with dobutamine and digoxin  Ischemic lower limbs, thrombectomy of right lower extremity, left lower extremity thrombectomy Patient continues to be on IV Protonix and  IV octreotide, however given current critical condition will not be a candidate for anesthesia and endoscopic intervention. Patient has been resumed on IV heparin, received 1 unit PRBC on 08/22/2019 and 2 units PRBC on 08/20/2019 Patient is on lactulose 10 g twice a day and rifaximin 550 mg twice daily  Plan: Overall, patient is a poor candidate for any endoscopic intervention. Start patient on clear liquid diet. Poor prognosis. Patient is a DNR.  Plan is to continue current supportive measures.   Ronnette Juniper 08/23/2019, 9:02 AM   Pager [phone] If no answer or after 5 PM call [phone]

## 2019-08-23 NOTE — Progress Notes (Signed)
ANTICOAGULATION CONSULT NOTE - Follow Up Consult  Pharmacy Consult for Heparin Indication: DVT, LV thrombus, and acute CVA  Allergies  Allergen Reactions  . Penicillins Other (See Comments)    Unsure of reaction Did it involve swelling of the face/tongue/throat, SOB, or low BP? Unknown Did it involve sudden or severe rash/hives, skin peeling, or any reaction on the inside of your mouth or nose? Unknown Did you need to seek medical attention at a hospital or doctor's office? Unknown When did it last happen?[place] If all above answers are "NO", may proceed with cephalosporin use.    Patient Measurements: Height: 5\' 5"  (165.1 cm) Weight: 179 lb 14.3 oz (81.6 kg) IBW/kg (Calculated) : 57 Heparin Dosing Weight: 75 kg  Vital Signs: Temp: 98.3 F (36.8 C) (09/26 1157) Temp Source: Oral (09/26 1157) BP: 115/67 (09/26 1400) Pulse Rate: 71 (09/26 1026)  Labs: Recent Labs    08/21/19 0340  08/22/19 0515 08/22/19 1125 08/22/19 1946 08/23/19 0348 08/23/19 1319  HGB 8.4*   < > 7.5* 9.4* 9.1* 8.5* 9.1*  HCT 24.5*   < > 22.1* 26.8* 25.5* 24.1* 25.9*  PLT 120*  --  148* 136*  --  152  --   HEPARINUNFRC  --    < > 0.46 0.60  --  0.21* 0.32  CREATININE 1.84*  --  1.78*  --   --  1.50*  --    < > = values in this interval not displayed.    Estimated Creatinine Clearance: 45.2 mL/min (A) (by C-G formula based on SCr of 1.5 mg/dL (H)).   Medications:  Scheduled:  . chlorhexidine  15 mL Mouth/Throat BID  . Chlorhexidine Gluconate Cloth  6 each Topical Daily  . digoxin  0.125 mg Oral Daily  . folic acid  1 mg Oral Daily  . lactulose  10 g Oral BID  . multivitamin with minerals  1 tablet Oral Daily  . nicotine  21 mg Transdermal Daily  . [START ON 08/24/2019] pantoprazole  40 mg Intravenous Q12H  . potassium chloride  20 mEq Oral BID  . rifaximin  550 mg Oral BID  . sodium chloride flush  10 mL Intravenous Q12H  . sodium chloride flush  10 mL Intravenous Q12H  . thiamine   100 mg Oral Daily   Infusions:  . sodium chloride Stopped (08/20/19 1943)  . DOBUTamine 2.5 mcg/kg/min (08/23/19 1325)  . heparin 1,200 Units/hr (08/23/19 0848)  . octreotide  (SANDOSTATIN)    IV infusion 50 mcg/hr (08/23/19 0700)    Assessment: [name] on heparin for LV thrombus and bilateral lower extremity ischemia w/ right popliteal vein DVT now s/p angioplasty and R/L popliteal thrombectomy of the tibial artery. Heparin therapy complicated by acute CVA and recent GI bleed and need for lower goal. Plans noted for coumadin when no further procedures  She is s/p mech thrombectomy of L popliteal 9/23. Heparin stopped 09/23 @ 1750 due to a GI bleed. Pt was started on octreotide and IV protonix. Diffuse melena noted in rectal bag. H/H 9.1/25.9   Heparin level is therapeutic at 0.32 with heparin IV infusing at 1200 units/hr.  Goal of Therapy:  Heparin level 0.3-0.5 units/ml Monitor platelets by anticoagulation protocol: Yes   Plan:  Continue heparin at 1200 units/hr Daily heparin level, CBC Monitor bleeding  Vertis Kelch, PharmD PGY2 Cardiology Pharmacy Resident Phone [phone] 08/23/2019       2:23 PM  Please check [email or web address] for unit-specific pharmacist phone numbers

## 2019-08-24 DIAGNOSIS — K254 Chronic or unspecified gastric ulcer with hemorrhage: Secondary | ICD-10-CM

## 2019-08-24 LAB — TYPE AND SCREEN
ABO/RH(D): A POS
Antibody Screen: NEGATIVE
Unit division: 0
Unit division: 0
Unit division: 0

## 2019-08-24 LAB — COMPREHENSIVE METABOLIC PANEL
ALT: 36 U/L (ref 0–44)
AST: 54 U/L — ABNORMAL HIGH (ref 15–41)
Albumin: 1.2 g/dL — ABNORMAL LOW (ref 3.5–5.0)
Alkaline Phosphatase: 61 U/L (ref 38–126)
Anion gap: 7 (ref 5–15)
BUN: 45 mg/dL — ABNORMAL HIGH (ref 6–20)
CO2: 22 mmol/L (ref 22–32)
Calcium: 7.4 mg/dL — ABNORMAL LOW (ref 8.9–10.3)
Chloride: 108 mmol/L (ref 98–111)
Creatinine, Ser: 1.34 mg/dL — ABNORMAL HIGH (ref 0.44–1.00)
GFR calc Af Amer: 52 mL/min — ABNORMAL LOW (ref >60)
GFR calc non Af Amer: 45 mL/min — ABNORMAL LOW (ref >60)
Glucose, Bld: 92 mg/dL (ref 70–99)
Potassium: 3.5 mmol/L (ref 3.5–5.1)
Sodium: 137 mmol/L (ref 135–145)
Total Bilirubin: 5.8 mg/dL — ABNORMAL HIGH (ref 0.3–1.2)
Total Protein: 4.8 g/dL — ABNORMAL LOW (ref 6.5–8.1)

## 2019-08-24 LAB — HEPARIN LEVEL (UNFRACTIONATED)
Heparin Unfractionated: 0.28 IU/mL — ABNORMAL LOW (ref 0.30–0.70)
Heparin Unfractionated: 0.3 IU/mL (ref 0.30–0.70)

## 2019-08-24 LAB — HEMOGLOBIN AND HEMATOCRIT, BLOOD
HCT: 24.1 % — ABNORMAL LOW (ref 36.0–46.0)
HCT: 25.7 % — ABNORMAL LOW (ref 36.0–46.0)
Hemoglobin: 8.3 g/dL — ABNORMAL LOW (ref 12.0–15.0)
Hemoglobin: 8.7 g/dL — ABNORMAL LOW (ref 12.0–15.0)

## 2019-08-24 LAB — CBC
HCT: 25 % — ABNORMAL LOW (ref 36.0–46.0)
Hemoglobin: 8.6 g/dL — ABNORMAL LOW (ref 12.0–15.0)
MCH: 32.3 pg (ref 26.0–34.0)
MCHC: 34.4 g/dL (ref 30.0–36.0)
MCV: 94 fL (ref 80.0–100.0)
Platelets: 195 10*3/uL (ref 150–400)
RBC: 2.66 MIL/uL — ABNORMAL LOW (ref 3.87–5.11)
RDW: 21.4 % — ABNORMAL HIGH (ref 11.5–15.5)
WBC: 11.9 10*3/uL — ABNORMAL HIGH (ref 4.0–10.5)
nRBC: 0.3 % — ABNORMAL HIGH (ref 0.0–0.2)

## 2019-08-24 LAB — COOXEMETRY PANEL
Carboxyhemoglobin: 2 % — ABNORMAL HIGH (ref 0.5–1.5)
Methemoglobin: 1.1 % (ref 0.0–1.5)
O2 Saturation: 70.5 %
Total hemoglobin: 9.3 g/dL — ABNORMAL LOW (ref 12.0–16.0)

## 2019-08-24 LAB — BPAM RBC
Blood Product Expiration Date: 202010022359
Blood Product Expiration Date: 202010062359
Blood Product Expiration Date: 202010192359
ISSUE DATE / TIME: 202009232033
ISSUE DATE / TIME: 202009232033
ISSUE DATE / TIME: 202009250807
Unit Type and Rh: 6200
Unit Type and Rh: 6200
Unit Type and Rh: 6200

## 2019-08-24 LAB — PROTIME-INR
INR: 3.9 — ABNORMAL HIGH (ref 0.8–1.2)
Prothrombin Time: 37.7 seconds — ABNORMAL HIGH (ref 11.4–15.2)

## 2019-08-24 LAB — GLUCOSE, CAPILLARY: Glucose-Capillary: 109 mg/dL — ABNORMAL HIGH (ref 70–99)

## 2019-08-24 MED ORDER — LACTULOSE 10 GM/15ML PO SOLN: 20.0000 g | Freq: Four times a day (QID) | ORAL | Status: DC

## 2019-08-24 MED ORDER — OXYCODONE HCL 5 MG PO TABS
5.0000 mg | ORAL_TABLET | ORAL | Status: DC | PRN
  Administered 2019-08-24 – 2019-08-25 (×4): 5 mg via ORAL
  Filled 2019-08-24 (×4): qty 1

## 2019-08-24 MED ORDER — FENTANYL CITRATE (PF) 100 MCG/2ML IJ SOLN
12.5000 ug | INTRAMUSCULAR | Status: DC | PRN
  Administered 2019-08-24: 12.5 ug via INTRAVENOUS
  Filled 2019-08-24: qty 2

## 2019-08-24 MED ORDER — GABAPENTIN 100 MG PO CAPS
200.0000 mg | ORAL_CAPSULE | Freq: Two times a day (BID) | ORAL | Status: DC
  Administered 2019-08-24 – 2019-08-25 (×3): 200 mg via ORAL
  Filled 2019-08-24 (×4): qty 2

## 2019-08-24 MED ORDER — LACTULOSE 10 GM/15ML PO SOLN
30.0000 g | Freq: Four times a day (QID) | ORAL | Status: DC
  Administered 2019-08-24 – 2019-08-25 (×7): 30 g via ORAL
  Filled 2019-08-24 (×7): qty 45

## 2019-08-24 NOTE — Progress Notes (Signed)
Subjective: Patient appears more lethargic than yesterday. Octreotide drip was discontinued yesterday and patient was transitioned to PPI twice daily from a drip. Rectal tube contains small amount of melanotic stool more in the tube than in the bag.  Objective: Vital signs in last 24 hours: Temp:  [96.6 F (35.9 C)-98.3 F (36.8 C)] 96.6 F (35.9 C) (09/27 0757) Pulse Rate:  [71] 71 (09/26 1026) Resp:  [10-23] 16 (09/27 0700) BP: (98-126)/(49-79) 102/60 (09/27 0700) SpO2:  [92 %-100 %] 100 % (09/27 0700) Weight:  [81.9 kg] 81.9 kg (09/27 0443) Weight change: 0.3 kg Last BM Date: 08/23/19  PE: Lying in bed, appears lethargic, speech not comprehensible GENERAL: Icteric, pale ABDOMEN: Soft, no evidence of ascites EXTREMITIES: No edema  Lab Results: Results for orders placed or performed during the hospital encounter of 08/15/19 (from the past 48 hour(s))  Heparin level (unfractionated)     Status: None   Collection Time: 08/22/19 11:25 AM  Result Value Ref Range   Heparin Unfractionated 0.60 0.30 - 0.70 IU/mL    Comment: (NOTE) If heparin results are below expected values, and patient dosage has  been confirmed, suggest follow up testing of antithrombin III levels. Performed at [facility], [place] [address], [place], [place] [address]   CBC     Status: Abnormal   Collection Time: 08/22/19 11:25 AM  Result Value Ref Range   WBC 15.3 (H) 4.0 - 10.5 K/uL   RBC 2.85 (L) 3.87 - 5.11 MIL/uL   Hemoglobin 9.4 (L) 12.0 - 15.0 g/dL    Comment: REPEATED TO VERIFY POST TRANSFUSION SPECIMEN    HCT 26.8 (L) 36.0 - 46.0 %   MCV 94.0 80.0 - 100.0 fL   MCH 33.0 26.0 - 34.0 pg   MCHC 35.1 30.0 - 36.0 g/dL   RDW 20.4 (H) 11.5 - 15.5 %   Platelets 136 (L) 150 - 400 K/uL   nRBC 0.2 0.0 - 0.2 %    Comment: Performed at [facility], [place] [address], [place], [place] [address]  MRSA PCR Screening     Status: None   Collection Time: 08/22/19 11:36 AM   Specimen: Nasopharyngeal   Result Value Ref Range   MRSA by PCR NEGATIVE NEGATIVE    Comment:        The GeneXpert MRSA Assay (FDA approved for NASAL specimens only), is one component of a comprehensive MRSA colonization surveillance program. It is not intended to diagnose MRSA infection nor to guide or monitor treatment for MRSA infections. Performed at [facility], [place] [address] [place], [place] [address]   Hemoglobin and hematocrit, blood     Status: Abnormal   Collection Time: 08/22/19  7:46 PM  Result Value Ref Range   Hemoglobin 9.1 (L) 12.0 - 15.0 g/dL   HCT 25.5 (L) 36.0 - 46.0 %    Comment: Performed at [facility], [place] [address], [place] [address]  CBC     Status: Abnormal   Collection Time: 08/23/19  3:48 AM  Result Value Ref Range   WBC 11.7 (H) 4.0 - 10.5 K/uL   RBC 2.60 (L) 3.87 - 5.11 MIL/uL   Hemoglobin 8.5 (L) 12.0 - 15.0 g/dL   HCT 24.1 (L) 36.0 - 46.0 %   MCV 92.7 80.0 - 100.0 fL   MCH 32.7 26.0 - 34.0 pg   MCHC 35.3 30.0 - 36.0 g/dL   RDW 21.2 (H) 11.5 - 15.5 %  Platelets 152 150 - 400 K/uL   nRBC 0.2 0.0 - 0.2 %    Comment: Performed at [facility], [place] [address] [place], [place] [address]  Heparin level (unfractionated)     Status: Abnormal   Collection Time: 08/23/19  3:48 AM  Result Value Ref Range   Heparin Unfractionated 0.21 (L) 0.30 - 0.70 IU/mL    Comment: (NOTE) If heparin results are below expected values, and patient dosage has  been confirmed, suggest follow up testing of antithrombin III levels. Performed at [facility], [place] [address] [place], [place] [address]   Comprehensive metabolic panel     Status: Abnormal   Collection Time: 08/23/19  3:48 AM  Result Value Ref Range   Sodium 136 135 - 145 mmol/L   Potassium 3.0 (L) 3.5 - 5.1 mmol/L   Chloride 107 98 - 111 mmol/L   CO2 20 (L) 22 - 32 mmol/L   Glucose, Bld 100 (H) 70 - 99 mg/dL   BUN 61 (H) 6 - 20 mg/dL   Creatinine, Ser 1.50 (H) 0.44 - 1.00 mg/dL    Calcium 7.6 (L) 8.9 - 10.3 mg/dL   Total Protein 4.6 (L) 6.5 - 8.1 g/dL   Albumin 1.2 (L) 3.5 - 5.0 g/dL   AST 47 (H) 15 - 41 U/L   ALT 32 0 - 44 U/L   Alkaline Phosphatase 54 38 - 126 U/L   Total Bilirubin 5.7 (H) 0.3 - 1.2 mg/dL   GFR calc non Af Amer 39 (L) >60 mL/min   GFR calc Af Amer 45 (L) >60 mL/min   Anion gap 9 5 - 15    Comment: Performed at [facility], [place] [address], [place], [place] [address]  .Cooxemetry Panel (carboxy, met, total hgb, O2 sat)     Status: Abnormal   Collection Time: 08/23/19  3:55 AM  Result Value Ref Range   Total hemoglobin 9.3 (L) 12.0 - 16.0 g/dL   O2 Saturation 79.2 %   Carboxyhemoglobin 2.0 (H) 0.5 - 1.5 %   Methemoglobin 1.0 0.0 - 1.5 %  Hemoglobin and hematocrit, blood     Status: Abnormal   Collection Time: 08/23/19  1:19 PM  Result Value Ref Range   Hemoglobin 9.1 (L) 12.0 - 15.0 g/dL   HCT 25.9 (L) 36.0 - 46.0 %    Comment: Performed at [facility], [place] [address], [place], [place] [address]  Heparin level (unfractionated)     Status: None   Collection Time: 08/23/19  1:19 PM  Result Value Ref Range   Heparin Unfractionated 0.32 0.30 - 0.70 IU/mL    Comment: (NOTE) If heparin results are below expected values, and patient dosage has  been confirmed, suggest follow up testing of antithrombin III levels. Performed at [facility], [place] [address] [place], [place] [address]   Potassium     Status: None   Collection Time: 08/23/19  5:23 PM  Result Value Ref Range   Potassium 3.7 3.5 - 5.1 mmol/L    Comment: DELTA CHECK NOTED Performed at [facility], [place] [address], [place], [place] [address]   Magnesium     Status: None   Collection Time: 08/23/19  5:23 PM  Result Value Ref Range   Magnesium 1.7 1.7 - 2.4 mg/dL    Comment: Performed at [facility], [place] [address], [place],  [address]  Hemoglobin and hematocrit, blood  Status: Abnormal   Collection Time: 08/23/19  7:37 PM  Result Value  Ref Range   Hemoglobin 8.8 (L) 12.0 - 15.0 g/dL   HCT 25.0 (L) 36.0 - 46.0 %    Comment: Performed at [facility], [place] [address], [place], [place] [address]  CBC     Status: Abnormal   Collection Time: 08/24/19  3:13 AM  Result Value Ref Range   WBC 11.9 (H) 4.0 - 10.5 K/uL   RBC 2.66 (L) 3.87 - 5.11 MIL/uL   Hemoglobin 8.6 (L) 12.0 - 15.0 g/dL   HCT 25.0 (L) 36.0 - 46.0 %   MCV 94.0 80.0 - 100.0 fL   MCH 32.3 26.0 - 34.0 pg   MCHC 34.4 30.0 - 36.0 g/dL   RDW 21.4 (H) 11.5 - 15.5 %   Platelets 195 150 - 400 K/uL   nRBC 0.3 (H) 0.0 - 0.2 %    Comment: Performed at [place] [address] [place], [place] [address]  Heparin level (unfractionated)     Status: Abnormal   Collection Time: 08/24/19  3:13 AM  Result Value Ref Range   Heparin Unfractionated 0.28 (L) 0.30 - 0.70 IU/mL    Comment: (NOTE) If heparin results are below expected values, and patient dosage has  been confirmed, suggest follow up testing of antithrombin III levels. Performed at [facility], [place] [address], [place], [place] [address]   .Cooxemetry Panel (carboxy, met, total hgb, O2 sat)     Status: Abnormal   Collection Time: 08/24/19  3:19 AM  Result Value Ref Range   Total hemoglobin 9.3 (L) 12.0 - 16.0 g/dL   O2 Saturation 70.5 %   Carboxyhemoglobin 2.0 (H) 0.5 - 1.5 %   Methemoglobin 1.1 0.0 - 1.5 %    Studies/Results: Dg Chest Port 1 View  Result Date: 08/23/2019 CLINICAL DATA:  Shortness of breath. EXAM: PORTABLE CHEST 1 VIEW COMPARISON:  Radiograph of August 22, 2019. FINDINGS: Stable cardiomegaly. Right internal jugular catheter tip is seen in expected position of the SVC. No pneumothorax is noted. Continued presence of moderate to large right pleural effusion is noted with associated atelectasis or infiltrate. Stable left basilar atelectasis or infiltrate is noted as well. Bony thorax is unremarkable. IMPRESSION: Stable right pleural effusion. Stable bibasilar atelectasis  or infiltrates. Electronically Signed   By: Marijo Conception M.D.   On: 08/23/2019 08:36   Dg Chest Port 1 View  Result Date: 08/22/2019 CLINICAL DATA:  Encounter for central line placement EXAM: PORTABLE CHEST 1 VIEW COMPARISON:  08/20/2019 FINDINGS: RIGHT central venous line with tip in the proximal SVC. This a change in position from the distal SVC on comparison exam 2 days prior. Stable large cardiac silhouette. Moderate to large RIGHT effusion again noted. No pneumothorax. Lung bases poorly evaluated IMPRESSION: 1. RIGHT central venous line now positioned with the tip in the most proximal SVC. 2. Moderate RIGHT pleural effusion unchanged. Electronically Signed   By: Suzy Bouchard M.D.   On: 08/22/2019 18:23    Medications: I have reviewed the patient's current medications.  Assessment: Hepatic encephalopathy likely from alcoholic hepatitis-no evidence of cirrhosis/portal or splenic vein thrombosis New right frontal lobe CVA Congestive heart failure with EF of 15%, biventricular heart failure, continued on dobutamine Large LV thrombus, on IV heparin Bilateral popliteal artery occlusion, mechanical thrombectomy of right superficial femoral/popliteal/posterior tibial artery on 08/19/2019 and mechanical thrombectomy of left popliteal/posterior tibial artery on 08/20/2019  Plan: Hemoglobin  stable at 8.8/8.6, decrease in melanotic stool, Protonix drip has been discontinued, octreotide drip has been discontinued, patient not a good candidate for EGD with anesthesia, on clear liquid diet and PPI twice daily. Worsening encephalopathy noted, will increase lactulose to 30 g every 6 hours while continuing Xifaxan. Overall poor prognosis.  Ronnette Juniper, MD 08/24/2019, 8:42 AM

## 2019-08-24 NOTE — Progress Notes (Signed)
Patient ID: Briana Andrews, female   DOB: 09-Jul-1966, 53 y.o.   MRN: [id number]     Advanced Heart Failure Rounding Note  PCP-Cardiologist: Ena Dawley, MD   Subjective:    Remains on dobutamine 2.5 mcg. Co-ox 71%  CVP 3-4.   Very confused. Only knows her name it is 68. Does not know where she is. GI increased lactulose/rifaxamin. C/o hurting all over. Says feet hurt.   On heparin. Has more melena in rectal bag today. Remains on octreotide   Objective:   Weight Range: 81.9 kg Body mass index is 30.05 kg/m.   Vital Signs:   Temp:  [96.6 F (35.9 C)-98.3 F (36.8 C)] 96.6 F (35.9 C) (09/27 0757) Pulse Rate:  [78] 78 (09/27 1059) Resp:  [10-23] 16 (09/27 0700) BP: (98-126)/(49-79) 102/60 (09/27 0700) SpO2:  [93 %-100 %] 100 % (09/27 0700) Weight:  [81.9 kg] 81.9 kg (09/27 0443) Last BM Date: 08/23/19  Weight change: Filed Weights   08/22/19 0415 08/23/19 0500 08/24/19 0443  Weight: 81.4 kg 81.6 kg 81.9 kg    Intake/Output:   Intake/Output Summary (Last 24 hours) at 08/24/2019 1134 Last data filed at 08/24/2019 0700 Gross per 24 hour  Intake 1287.21 ml  Output 3150 ml  Net -1862.79 ml      Physical Exam    General: Sitting in bed.  Ill-appearing jaundiced.  Confused HEENT: normal + scleral icterus Neck: supple. no JVD. Carotids 2+ bilat; no bruits. No lymphadenopathy or thryomegaly appreciated. Cor: PMI nondisplaced. Regular rate & rhythm. 2/6 TR Lungs: clear decreased on R Abdomen: soft, nontender, nondistended. No hepatosplenomegaly. No bruits or masses. Good bowel sounds. Extremities: no cyanosis, clubbing, rash, 2+ edema Dressings on feet. R foot with wounds Neuro: awake confused.    Telemetry   NSR 70s personally reviewed.   Labs    CBC Recent Labs    08/23/19 0348  08/23/19 1937 08/24/19 0313  WBC 11.7*  --   --  11.9*  HGB 8.5*   < > 8.8* 8.6*  HCT 24.1*   < > 25.0* 25.0*  MCV 92.7  --   --  94.0  PLT 152  --   --  195   < > =  values in this interval not displayed.   Basic Metabolic Panel Recent Labs    08/22/19 0515 08/23/19 0348 08/23/19 1723 08/24/19 0849  NA 135 136  --  137  K 3.5 3.0* 3.7 3.5  CL 107 107  --  108  CO2 19* 20*  --  22  GLUCOSE 103* 100*  --  92  BUN 76* 61*  --  45*  CREATININE 1.78* 1.50*  --  1.34*  CALCIUM 7.3* 7.6*  --  7.4*  MG 2.0  --  1.7  --   PHOS 4.3  --   --   --    Liver Function Tests Recent Labs    08/23/19 0348 08/24/19 0849  AST 47* 54*  ALT 32 36  ALKPHOS 54 61  BILITOT 5.7* 5.8*  PROT 4.6* 4.8*  ALBUMIN 1.2* 1.2*   No results for input(s): LIPASE, AMYLASE in the last 72 hours. Cardiac Enzymes No results for input(s): CKTOTAL, CKMB, CKMBINDEX, TROPONINI in the last 72 hours.  BNP: BNP (last 3 results) No results for input(s): BNP in the last 8760 hours.  ProBNP (last 3 results) No results for input(s): PROBNP in the last 8760 hours.   D-Dimer No results for input(s): DDIMER in the last 72  hours. Hemoglobin A1C No results for input(s): HGBA1C in the last 72 hours. Fasting Lipid Panel No results for input(s): CHOL, HDL, LDLCALC, TRIG, CHOLHDL, LDLDIRECT in the last 72 hours. Thyroid Function Tests No results for input(s): TSH, T4TOTAL, T3FREE, THYROIDAB in the last 72 hours.  Invalid input(s): FREET3  Other results:   Imaging    No results found.   Medications:     Scheduled Medications: . chlorhexidine  15 mL Mouth/Throat BID  . Chlorhexidine Gluconate Cloth  6 each Topical Daily  . digoxin  0.125 mg Oral Daily  . folic acid  1 mg Oral Daily  . gabapentin  200 mg Oral BID  . lactulose  30 g Oral Q6H  . multivitamin with minerals  1 tablet Oral Daily  . nicotine  21 mg Transdermal Daily  . pantoprazole  40 mg Intravenous Q12H  . rifaximin  550 mg Oral BID  . sodium chloride flush  10 mL Intravenous Q12H  . sodium chloride flush  10 mL Intravenous Q12H  . thiamine  100 mg Oral Daily    Infusions: . sodium chloride 10  mL/hr at 08/23/19 0800  . DOBUTamine 2.5 mcg/kg/min (08/23/19 1325)  . heparin 1,250 Units/hr (08/24/19 0815)    PRN Medications: sodium chloride, acetaminophen, albuterol, dextromethorphan-guaiFENesin, fentaNYL (SUBLIMAZE) injection, hydrALAZINE, labetalol, ondansetron (ZOFRAN) IV, ondansetron **OR** [DISCONTINUED] ondansetron (ZOFRAN) IV, oxyCODONE, sodium chloride flush, sodium chloride flush, sodium chloride flush   Assessment/Plan   1. Acute on chronic systolic CHF/cardiogenic shock: Echo with EF 15-20%, mild LV dilation, LV thrombus, mild-moderate MR, severely decreased RV systolic function.   Cause of cardiomyopathy is uncertain, but she was a heavy smoker and ECG is suggestive of possible old ASMI. Ischemic cardiomyopathy is possible, alternatively could be due to ETOH abuse (>6 pack/day per husband).  Lactate was elevated 4 days ago at admission.  Suspect cardiogenic shock with low BP, cannot rule out component of septic shock. Currently on dobutamine 2.5 with stable co-ox (48% pre-dobutamine, 71% this morning) and stable MAP.   - CVP 3-4 today. Will hold off on lasix today - Continue dobutamine at 2.5 and digoxin - Per Dr. Shirlee Latch, she would ideally have right/left heart cath if creatinine stable and GI bleeding stops. That said, currently with ongoing melena and encephalopathy so will need to re-evaluate. Likely not candidate for PCI due to inability to give ASA/DAPT. Unable to discuss risks/indications with her at this point due to encephalopathy. - Not candidate for advanced therapies - After discussions with her husband CCM and GI recommending [facility]. She is now DNR.  2. 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 Markedly elevated bilirubin with jaundice.  However liver imaging with [place] and CT has NOT shown cirrhosis or portal/hepatic vein thrombosis. Question has arisen whether this might possibly be due to primarily to RV failure.  Time course seems quite acute for RV  failure.  - holding stain and other hepato-toxic agents  3. Encephalopathy: ?Cause.  NH3 has not been markedly high but certainly could be hepatic encephalopathy.  Shock likely plays a role.  - More encephalopathic today. GI increased lactulose/rifaxamin 4. AKI: Creatinine improved to 1.3 with inotropic support.  Suspect cardiorenal syndrome, also had contrast with LE angiograms so possible component of contrast nephropathy.  - Support BP and cardiac output.  5. LV thrombus: Possible cause of embolic event to popliteal arteries.  Heparin gtt held with active GI bleeding. - Heparin drip restarted 9/24  - Still with ongoing melena but  has slowed some - D/w CCM. No good options here. Per GI not candidate for scope at this point 6. PAD: Bilateral popliteal artery occlusions, ?cardioembolic from LV thrombus.  Now s/p mechanical thrombectomy by vascular.  7. 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  Treatment will be anticoagulation.  8. Right popliteal vein DVT: Heparin gtt restarted 9/24 9. GI bleeding: More melena in rectal bag today.  Hgb down to 5.9, 2 unit transfusion 9/23 and now 7.5. Transfused again. Hgb dropping slowly 9.1-> 8.8 -> 8.6  in setting of heparin for LV thrombus and embolic disease. Not on asa with bleeding.  - Protonix now pot and octreotide gtt per GI.  - Follow H/H 10. ETOH abuse: Per husband, at least a 6 pack daily.  11. ID: Concern for aspiration PNA. - Continue ceftriaxone.  12. DNR  She remains critically ill with mult-system organ failure. Suspect cardiomyopathy likely due to critical illness in setting of probable ESLD and MSOF but may be ischemic in nature. With ongoing bleeding options quite limited. She is now DNR.   Will continue dobutamine for now. Defer decisions on invasive w/u to Dr. Shirlee Latch.  CRITICAL CARE Performed by: Arvilla Meres  Total critical care time: 35 minutes  Critical care time was exclusive of separately  billable procedures and treating other patients.  Critical care was necessary to treat or prevent imminent or life-threatening deterioration.  Critical care was time spent personally by me (independent of midlevel providers or residents) on the following activities: development of treatment plan with patient and/or surrogate as well as nursing, discussions with consultants, evaluation of patient's response to treatment, examination of patient, obtaining history from patient or surrogate, ordering and performing treatments and interventions, ordering and review of laboratory studies, ordering and review of radiographic studies, pulse oximetry and re-evaluation of patient's condition.    Length of Stay: 8  Arvilla Meres, MD  08/24/2019, 11:34 AM  Advanced Heart Failure Team Pager [phone] (M-F; 7a - 4p)  Please contact [facility] for night-coverage after hours (4p -7a ) and weekends on [email or web address]

## 2019-08-24 NOTE — Progress Notes (Signed)
Daily Progress Note   Patient Name: Briana Andrews       Date: 08/24/2019 DOB: May 30, 1966  Age: 53 y.o. MRN#: [id number] Attending Physician: Candee Furbish, MD Primary Care Physician: Patient, [name] Admit Date: 08/15/2019  Reason for Consultation/Follow-up: Establishing goals of care  Subjective: Patient in bed, moaning- states bottom of her feet hurt. No family at bedside. She is not eating or drinking. Lunch tray is untouched at bedside. She continues to be encephalopathic- cannot engage in [place] discussion.   Review of Systems  Unable to perform ROS: Mental status change    Length of Stay: 8  Current Medications: Scheduled Meds:  . chlorhexidine  15 mL Mouth/Throat BID  . Chlorhexidine Gluconate Cloth  6 each Topical Daily  . digoxin  0.125 mg Oral Daily  . folic acid  1 mg Oral Daily  . gabapentin  200 mg Oral BID  . lactulose  30 g Oral Q6H  . multivitamin with minerals  1 tablet Oral Daily  . nicotine  21 mg Transdermal Daily  . pantoprazole  40 mg Intravenous Q12H  . rifaximin  550 mg Oral BID  . sodium chloride flush  10 mL Intravenous Q12H  . sodium chloride flush  10 mL Intravenous Q12H  . thiamine  100 mg Oral Daily    Continuous Infusions: . sodium chloride 10 mL/hr at 08/23/19 0800  . DOBUTamine 2.5 mcg/kg/min (08/23/19 1325)  . heparin 1,250 Units/hr (08/24/19 1200)    PRN Meds: sodium chloride, acetaminophen, albuterol, dextromethorphan-guaiFENesin, fentaNYL (SUBLIMAZE) injection, hydrALAZINE, labetalol, ondansetron (ZOFRAN) IV, ondansetron **OR** [DISCONTINUED] ondansetron (ZOFRAN) IV, oxyCODONE, sodium chloride flush, sodium chloride flush, sodium chloride flush  Physical Exam Vitals signs and nursing note reviewed.  Constitutional:      Appearance:  She is ill-appearing and toxic-appearing.  Pulmonary:     Effort: Pulmonary effort is normal.  Skin:    Coloration: Skin is jaundiced.     Comments: Jaundice improving  Neurological:     Mental Status: She is alert. She is disoriented.             Vital Signs: BP (!) 102/56 (BP Location: Left Arm)   Pulse 78   Temp 97.8 F (36.6 C) (Axillary)   Resp 13   Ht 5\' 5"  (1.651 m)   Wt 81.9 kg  SpO2 94%   BMI 30.05 kg/m  SpO2: SpO2: 94 % O2 Device: O2 Device: Room Air O2 Flow Rate: O2 Flow Rate (L/min): 2 L/min  Intake/output summary:   Intake/Output Summary (Last 24 hours) at 08/24/2019 1410 Last data filed at 08/24/2019 1200 Gross per 24 hour  Intake 1592.67 ml  Output 2750 ml  Net -1157.33 ml   LBM: Last BM Date: 08/23/19 Baseline Weight: Weight: 81.6 kg Most recent weight: Weight: 81.9 kg       Palliative Assessment/Data: PPS: 20%      Patient Active Problem List   Diagnosis Date Noted  . Pressure injury of skin 08/23/2019  . Acute upper GI bleed   . Shock circulatory (HCC)   . Palliative care by specialist   . Goals of care, counseling/discussion   . Advanced care planning/counseling discussion   . Jaundice   . Cerebral embolism with cerebral infarction 08/17/2019  . PVD (peripheral vascular disease) (HCC) 08/17/2019  . Left ventricular apical thrombus without MI 08/17/2019  . Acute CHF (congestive heart failure) (HCC) 08/17/2019  . Abnormal LFTs 08/16/2019  . Acute metabolic encephalopathy 08/16/2019  . SOB (shortness of breath) 08/16/2019  . AKI (acute kidney injury) (HCC) 08/16/2019  . Fall 08/16/2019  . Lactic acidosis 08/16/2019  . Back pain 08/16/2019  . Liver failure without hepatic coma (HCC) 08/16/2019  . Multifocal pneumonia 08/16/2019  . Alcohol abuse   . Tobacco abuse   . Shortness of breath     Palliative Care Assessment & Plan   Patient Profile:  53 y.o. female  with past medical history of  admitted on 08/15/2019 with weakness and  jaundice. 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 She has progressed during admission to cardiogenic shock and AKI. Her albumin is significantly decreased at 1.2. She remains severely encephalopathic and hypotensive. Palliative medicine consulted for GOC. She has been made DNR by critical care.    Assessment/Recommendations/Plan   Patient with significant comorbidities- not eating or drinking, with ongoing pain in her feet and lower back (history of compression fracture)  She is continuing with GI bleed and needing anticoagulation, she is dobutamine dependent, she has significant peripheral vascular issues-  these are all confounding issues that preclude treatments for one another  Unfortunately, I do not see a good clear end point for Briana Andrews. It appears that no current interventions are currently curing any of her medical problems or improving her functional status. I will attempt to discuss this with patient's spouse. Have discussed this with Anders Simmonds, NP as well.   Note plan for possible cardiac cath- I would encourage provider to engage in discussion with spouse re: what would be the end goal of cardiac cath? Is a cath going to increase patient's function, and cure her other overarching medical problems (liver failure, GI bleeding, ischemic limbs, frontal CVA, LV thrombus, hypoalbuminemia, poor po intake)?   Significant pain- will add fentanyl 12.5mcg q2hr for severe pain, start gabapentin 200mg  BID for likely neuropathic pain related to ischemic extremities, and increase frequency of oxycodone 5mg  to q3hr prn.  I am concerned that despite all interventions patient is dying.   Code Status:  DNR  Prognosis:   Unable to determine- very poor give multiple comorbidities that play against one another,  additionally patient is eating and drinking very little  Discharge Planning:  To  Be Determined  Care plan was discussed with Anders Simmonds, NP and patient's RN  Thank you for allowing the Palliative Medicine Team to assist in the care of this patient.   Time In: 1100 Time Out: 1135 Total Time 35 mins Prolonged Time Billed no      Greater than 50%  of this time was spent counseling and coordinating care related to the above assessment and plan.  Ocie Bob, AGNP-C Palliative Medicine   Please contact Palliative Medicine Team phone at [phone] for questions and concerns.

## 2019-08-24 NOTE — Progress Notes (Signed)
ANTICOAGULATION CONSULT NOTE - Follow Up Consult  Pharmacy Consult for Heparin Indication: DVT, LV thrombus, and acute CVA  Allergies  Allergen Reactions  . Penicillins Other (See Comments)    Unsure of reaction Did it involve swelling of the face/tongue/throat, SOB, or low BP? Unknown Did it involve sudden or severe rash/hives, skin peeling, or any reaction on the inside of your mouth or nose? Unknown Did you need to seek medical attention at a hospital or doctor's office? Unknown When did it last happen?[place] If all above answers are "NO", may proceed with cephalosporin use.    Patient Measurements: Height: 5\' 5"  (165.1 cm) Weight: 180 lb 8.9 oz (81.9 kg) IBW/kg (Calculated) : 57 Heparin Dosing Weight: 75 kg  Vital Signs: Temp: 97.7 F (36.5 C) (09/27 0400) Temp Source: Oral (09/27 0400) BP: 102/60 (09/27 0700)  Labs: Recent Labs    08/22/19 0515 08/22/19 1125  08/23/19 0348 08/23/19 1319 08/23/19 1937 08/24/19 0313  HGB 7.5* 9.4*   < > 8.5* 9.1* 8.8* 8.6*  HCT 22.1* 26.8*   < > 24.1* 25.9* 25.0* 25.0*  PLT 148* 136*  --  152  --   --  195  HEPARINUNFRC 0.46 0.60  --  0.21* 0.32  --  0.28*  CREATININE 1.78*  --   --  1.50*  --   --   --    < > = values in this interval not displayed.    Estimated Creatinine Clearance: 45.3 mL/min (A) (by C-G formula based on SCr of 1.5 mg/dL (H)).   Medications:  Scheduled:  . chlorhexidine  15 mL Mouth/Throat BID  . Chlorhexidine Gluconate Cloth  6 each Topical Daily  . digoxin  0.125 mg Oral Daily  . folic acid  1 mg Oral Daily  . lactulose  10 g Oral BID  . multivitamin with minerals  1 tablet Oral Daily  . nicotine  21 mg Transdermal Daily  . pantoprazole  40 mg Intravenous Q12H  . potassium chloride  20 mEq Oral BID  . rifaximin  550 mg Oral BID  . sodium chloride flush  10 mL Intravenous Q12H  . sodium chloride flush  10 mL Intravenous Q12H  . thiamine  100 mg Oral Daily   Infusions:  . sodium  chloride 10 mL/hr at 08/23/19 0800  . DOBUTamine 2.5 mcg/kg/min (08/23/19 1325)  . heparin 1,200 Units/hr (08/24/19 0600)    Assessment: [name] on heparin for LV thrombus and bilateral lower extremity ischemia w/ right popliteal vein DVT now s/p angioplasty and R/L popliteal thrombectomy of the tibial artery. Heparin therapy complicated by acute CVA and recent GI bleed and need for lower goal. Plans noted for coumadin when no further procedures  She is s/p mech thrombectomy of L popliteal 9/23. Heparin stopped 09/23 @ 1750 due to a GI bleed. Pt was started on octreotide and IV protonix. Diffuse melena noted in rectal bag 9/26. RN stated patient did begin menstruation yesterday. H/H down to 8.6/25.    Heparin level is therapeutic slightly low at 0.28 this AM.  Goal of Therapy:  Heparin level 0.3-0.5 units/ml Monitor platelets by anticoagulation protocol: Yes   Plan:  Will increase heparin IV slightly to 1250 units/hr Check 6 hour heparin level  Daily heparin level, CBC Monitor bleeding  Vertis Kelch, PharmD PGY2 Cardiology Pharmacy Resident Phone [phone] 08/24/2019       7:30 AM  Please check [email or web address] for unit-specific pharmacist phone numbers

## 2019-08-24 NOTE — Progress Notes (Signed)
ANTICOAGULATION CONSULT NOTE - Follow Up Consult  Pharmacy Consult for Heparin Indication: DVT, LV thrombus, and acute CVA  Allergies  Allergen Reactions  . Penicillins Other (See Comments)    Unsure of reaction Did it involve swelling of the face/tongue/throat, SOB, or low BP? Unknown Did it involve sudden or severe rash/hives, skin peeling, or any reaction on the inside of your mouth or nose? Unknown Did you need to seek medical attention at a hospital or doctor's office? Unknown When did it last happen?[place] If all above answers are "NO", may proceed with cephalosporin use.    Patient Measurements: Height: 5\' 5"  (165.1 cm) Weight: 180 lb 8.9 oz (81.9 kg) IBW/kg (Calculated) : 57 Heparin Dosing Weight: 75 kg  Vital Signs: Temp: 97.8 F (36.6 C) (09/27 1141) Temp Source: Axillary (09/27 1141) BP: 100/55 (09/27 1400) Pulse Rate: 78 (09/27 1059)  Labs: Recent Labs    08/22/19 0515 08/22/19 1125  08/23/19 0348 08/23/19 1319 08/23/19 1937 08/24/19 0313 08/24/19 0849 08/24/19 1433  HGB 7.5* 9.4*   < > 8.5* 9.1* 8.8* 8.6*  --  8.3*  HCT 22.1* 26.8*   < > 24.1* 25.9* 25.0* 25.0*  --  24.1*  PLT 148* 136*  --  152  --   --  195  --   --   LABPROT  --   --   --   --   --   --   --  37.7*  --   INR  --   --   --   --   --   --   --  3.9*  --   HEPARINUNFRC 0.46 0.60  --  0.21* 0.32  --  0.28*  --  0.30  CREATININE 1.78*  --   --  1.50*  --   --   --  1.34*  --    < > = values in this interval not displayed.    Estimated Creatinine Clearance: 50.8 mL/min (A) (by C-G formula based on SCr of 1.34 mg/dL (H)).   Medications:  Scheduled:  . chlorhexidine  15 mL Mouth/Throat BID  . Chlorhexidine Gluconate Cloth  6 each Topical Daily  . digoxin  0.125 mg Oral Daily  . folic acid  1 mg Oral Daily  . gabapentin  200 mg Oral BID  . lactulose  30 g Oral Q6H  . multivitamin with minerals  1 tablet Oral Daily  . nicotine  21 mg Transdermal Daily  . pantoprazole  40  mg Intravenous Q12H  . rifaximin  550 mg Oral BID  . sodium chloride flush  10 mL Intravenous Q12H  . sodium chloride flush  10 mL Intravenous Q12H  . thiamine  100 mg Oral Daily   Infusions:  . sodium chloride 10 mL/hr at 08/23/19 0800  . DOBUTamine 2.5 mcg/kg/min (08/23/19 1325)  . heparin 1,250 Units/hr (08/24/19 1200)    Assessment: [name] on heparin for LV thrombus and bilateral lower extremity ischemia w/ right popliteal vein DVT now s/p angioplasty and R/L popliteal thrombectomy of the tibial artery. Heparin therapy complicated by acute CVA and recent GI bleed and need for lower goal. Plans noted for coumadin when no further procedures  She is s/p mech thrombectomy of L popliteal 9/23. Heparin stopped 09/23 @ 1750 due to a GI bleed. Pt was started on octreotide and IV protonix. Diffuse melena noted in rectal bag 9/26. RN stated patient did begin menstruation yesterday. H/H down to 8.6/25.  Repeat heparin level is therapeutic at 0.3 - will not titrate given previous bleeding issues.  Goal of Therapy:  Heparin level 0.3-0.5 units/ml Monitor platelets by anticoagulation protocol: Yes   Plan:  -Continue heparin 1250 units/hr -Daily heparin level and CBC   Fredonia Highland, PharmD, BCPS Clinical Pharmacist [phone] Please check AMION for all [facility] numbers 08/24/2019

## 2019-08-24 NOTE — Progress Notes (Signed)
`  NAME:  Briana Andrews, MRN:  [id number], DOB:  18-Oct-1966, LOS: 8 ADMISSION DATE:  08/15/2019, CONSULTATION DATE: 9/23 REFERRING MD:  Jearld Pies, CHIEF COMPLAINT:  Post-op shock   Brief History   53 yof admitted 9/19 w/ AMS and lethargy. 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 She was seen by multiple sub specialists including: Neuro, cardiology, vascular surgery and GI. She underwent subsequent mechanical thrombectomy of the lower extremities (the right on 9/22 and the left on 9/23). PCCM asked to see on 9/23 as she was hypotensive    Past Medical History  ETOH and tobacco abuse.   Significant Hospital Events   9/19 admitted. MRI  subacute right frontal ACA territory infarct and small right caudate and cerebellar infarcts.  As part of the work up an echo was obtained. 9/20 ECHO showed: new CM EF 15-20%  W/ large fixed LV thrombus and was started on [facility] and cardiology was consulted. She was also seen by vascular surg w/ concern for ischemic LEs. US showed bilateral total popliteal artery occlusion w/ DVT on right.  GI was consulted for liver failure, heme + stool and dropping hgb.  9/22 for thrombectomy of the RLE   9/23 for left LE thrombectomy of the left post tib and popliteal artery. She returned to the med-surg floor hypotensive and more confused which was why PCCM was consulted. Also having melanotic stool w/ hgb drop so GI starting octreotide and ppi gtt. Transferred to ICU, DNR order placed. Palliative consulted-- no escalation of care  9/24> Improved mentation. Heparin resumed. Continues on dobutamine 9/25> continues on dobutamine 9/26: Remains stable renal function a little improved.  Mental status a little improved.  Protonix and octreotide stopped antibiotics discontinued  Consults:  Neuro 9/19 Cardiology  9/19 Vascular surgey 9/19 GI  PCCM 9/23 Palliative Care Medicine 9/23   Procedures:  9/22 Mechanical thrombectomy of the right superficial femoral, popliteal, and posterior tibial artery using the penumbra CAT 6 device 9/23 Mechanical thrombectomy of the left popliteal and posterior tibial artery using the Penumbra CAT 6 device Significant Diagnostic Tests:  Echo 9/19: Left Ventricle: Left ventricular ejection fraction, by visual estimation, is 20%. The left ventricle has severely decreased function. There is a large, fixed, apical left ventricular thrombus. Global RV systolic function is has severely reduced systolic function Mri brain 9/19: 1. Gyriform post ischemic appearing enhancement in the right ACA territory, corresponding to most confluent diffusion abnormality earlier today. 2. Faint linear enhancement in the left frontal lobe white matter appears to reflect an area of late subacute ischemia. CT abd nml pancreas, no biliary dilation, no gallstones.   9/24 Vascular US> RLe moderate arterial disease. LLE moderate arterial disease.   Micro Data:  9/19 SARS cov2> neg 9/19 RVP> neg 9/19 BCx> NGTD  Antimicrobials:  levaquin 9/21>>> 9/23 Ceftriaxone 9/23>> 9/26  Interim history/subjective:  Looks about the same Objective   Blood pressure 102/60, pulse 71, temperature 97.7 F (36.5 C), temperature source Oral, resp. rate 16, height 5\' 5"  (1.651 m), weight 81.9 kg, SpO2 100 %. CVP:  [5 mmHg-10 mmHg] 10 mmHg      Intake/Output Summary (Last 24 hours) at 08/24/2019 0743 Last data filed at 08/24/2019 0700 Gross per 24 hour  Intake 1888.58 ml  Output 3150 ml  Net -1261.42 ml   08/26/2019  08/22/19 0415 08/23/19 0500 08/24/19 0443  Weight: 81.4 kg 81.6 kg 81.9 kg    Examination: General confused 53 year old white female sitting up in bed no acute distress today HEENT normocephalic icteric sclera no JVD mucous membranes moist Pulmonary rhonchi on the right no  accessory use Cardiac regular rate and rhythm Abdomen soft not tender Extremities warm dry dependent edema GU clear yellow Neuro remains confused  Resolved Hospital Problem list     Assessment & Plan:    Cardiogenic shock 2/2 biventricular heart failure -EF 15-20% Confounding components of sepsis and hypovolemia (blood loss 2/2 GIB) -leukocytosis is globally downtrending and patient is afebrile, she is hemodynamically stable.  Seem to be treating SVO 2 at this point I am not sure we need to keep doing this Plan Continue digoxin  Continue telemetry monitoring  Additional recommendations per heart failure team  Large LV thrombus  Plan Continuing IV heparin  Acute metabolic encephalopathy 2/2 shock, hepatic dysfunction, recent subacute CVA -At risk for EtOH withdrawal  Plan Limit sedation Continue rifaximin and lactulose  Acute GIB Acute blood loss anemia, stable  -GIB etiology r/t EtOH hepatitis vs Hepatic dysfunction 2/2 RV failure  CBC has remained relatively stable on heparin Plan Transfuse for hemoglobin less than 7  IV Protonix twice daily  Clear liquids   Bilateral pulmonary opacities with moderate size right effusion->s/p treatment for PNA  -PNA vs edema vs atelectasis -wbc ct stable  Plan Trending pulse oximetry PRN chest x-ray Suspect the right effusion is hepatic hydro-thorax, would only consider thoracentesis if we had significant evidence it was affecting her pulmonary status at this point does not seem to be   BLE arterial occlusion, s/p bilateral thrombectomy, it appears as though there is some right lower extremity ischemia Plan Cont IV heparin Continue serial circulatory checks  Additional recommendations per vascular surgery monitoring lower extremity, I suspect the right foot will demarcate over time   AKI, improving  -likely multifactorial in setting of shock, IV contrast -Currently renal function improving Plan Cont to trend   Fluid and  electrolyte imbalance: Hypokalemia mild Anion gap metabolic acidosis Plan Trend and replace as indicated  Malnutrition in setting of EtOH use disorder Plan Cont slow progression of diet as directed by GI  Jaundice -Most likely etiology is alcoholic Hepatitis - Possible hepatic dysfunction related to RV dysfunction  Plan Trending liver function tests  Coagulopathy, mild, in setting of shock and liver dysfunction Plan Awaiting INR    Goals of Care: We will continue current supportive measures.  I had a long discussion on initial consultation with the patient's husband.  We have seen some mild stabilization but we are in no way out of the woods, and I am doubtful that she will return to baseline functional status.  Ideally I would like to see [place] get her off inotropes and just monitor her supportively.  If things continue to stabilize with supportive measures then perhaps we could consider further diagnostics but until that point ongoing supportive care would be more consistent with our agreement. Plan We will continue ICU level monitoring, the only thing keeping her here in the intensive care at this point is the dobutamine infusion.  We seem to be treating and SVO 2 at this point. Once again I recommend discontinuing dobutamine infusion.  I am not convinced she is a candidate for any long-term interventions. From a critical care standpoint we have addressed the most important issue which is goals of care and establishing reasonable expectations.  We are not really adding much to this case at this point We will talk to cardiology, if were going to continue to push cardiac diagnostics I think she may be better served on their service  Simonne Martinet ACNP-BC [facility] Pulmonary/Critical Care Pager # [phone] OR # [phone] if no answer

## 2019-08-25 ENCOUNTER — Ambulatory Visit (HOSPITAL_COMMUNITY): Admission: RE | Admit: 2019-08-25 | Payer: 59 | Source: Home / Self Care | Admitting: Cardiology

## 2019-08-25 ENCOUNTER — Inpatient Hospital Stay (HOSPITAL_COMMUNITY): Payer: 59

## 2019-08-25 ENCOUNTER — Encounter (HOSPITAL_COMMUNITY)
Admission: EM | Disposition: A | Discharge status: Skilled Nursing Facility | Payer: Self-pay | Source: Home / Self Care | Attending: Internal Medicine

## 2019-08-25 LAB — CBC
HCT: 22.6 % — ABNORMAL LOW (ref 36.0–46.0)
Hemoglobin: 7.7 g/dL — ABNORMAL LOW (ref 12.0–15.0)
MCH: 32.9 pg (ref 26.0–34.0)
MCHC: 34.1 g/dL (ref 30.0–36.0)
MCV: 96.6 fL (ref 80.0–100.0)
Platelets: 212 10*3/uL (ref 150–400)
RBC: 2.34 MIL/uL — ABNORMAL LOW (ref 3.87–5.11)
RDW: 21.7 % — ABNORMAL HIGH (ref 11.5–15.5)
WBC: 11.3 10*3/uL — ABNORMAL HIGH (ref 4.0–10.5)
nRBC: 0.2 % (ref 0.0–0.2)

## 2019-08-25 LAB — BASIC METABOLIC PANEL
Anion gap: 4 — ABNORMAL LOW (ref 5–15)
BUN: 33 mg/dL — ABNORMAL HIGH (ref 6–20)
CO2: 24 mmol/L (ref 22–32)
Calcium: 7.6 mg/dL — ABNORMAL LOW (ref 8.9–10.3)
Chloride: 108 mmol/L (ref 98–111)
Creatinine, Ser: 1.17 mg/dL — ABNORMAL HIGH (ref 0.44–1.00)
GFR calc Af Amer: >60 mL/min (ref >60)
GFR calc non Af Amer: 53 mL/min — ABNORMAL LOW (ref >60)
Glucose, Bld: 111 mg/dL — ABNORMAL HIGH (ref 70–99)
Potassium: 3.3 mmol/L — ABNORMAL LOW (ref 3.5–5.1)
Sodium: 136 mmol/L (ref 135–145)

## 2019-08-25 LAB — FACTOR 5 LEIDEN

## 2019-08-25 LAB — POCT I-STAT 7, (LYTES, BLD GAS, ICA,H+H)
Acid-base deficit: 3 mmol/L — ABNORMAL HIGH (ref 0.0–2.0)
Bicarbonate: 21.1 mmol/L (ref 20.0–28.0)
Calcium, Ion: 1.22 mmol/L (ref 1.15–1.40)
HCT: 33 % — ABNORMAL LOW (ref 36.0–46.0)
Hemoglobin: 11.2 g/dL — ABNORMAL LOW (ref 12.0–15.0)
O2 Saturation: 100 %
Potassium: 3.5 mmol/L (ref 3.5–5.1)
Sodium: 139 mmol/L (ref 135–145)
TCO2: 22 mmol/L (ref 22–32)
pCO2 arterial: 35.1 mmHg (ref 32.0–48.0)
pH, Arterial: 7.387 (ref 7.350–7.450)
pO2, Arterial: 285 mmHg — ABNORMAL HIGH (ref 83.0–108.0)

## 2019-08-25 LAB — PROTIME-INR
INR: 4 — ABNORMAL HIGH (ref 0.8–1.2)
Prothrombin Time: 38.5 seconds — ABNORMAL HIGH (ref 11.4–15.2)

## 2019-08-25 LAB — HEMOGLOBIN AND HEMATOCRIT, BLOOD
HCT: 30.6 % — ABNORMAL LOW (ref 36.0–46.0)
HCT: 30 % — ABNORMAL LOW (ref 36.0–46.0)
Hemoglobin: 10.9 g/dL — ABNORMAL LOW (ref 12.0–15.0)
Hemoglobin: 10.7 g/dL — ABNORMAL LOW (ref 12.0–15.0)

## 2019-08-25 LAB — PREPARE RBC (CROSSMATCH)

## 2019-08-25 LAB — COOXEMETRY PANEL
Carboxyhemoglobin: 2.2 % — ABNORMAL HIGH (ref 0.5–1.5)
Methemoglobin: 0.8 % (ref 0.0–1.5)
O2 Saturation: 74.1 %
Total hemoglobin: 7.2 g/dL — ABNORMAL LOW (ref 12.0–16.0)

## 2019-08-25 LAB — HEPARIN LEVEL (UNFRACTIONATED): Heparin Unfractionated: 0.29 IU/mL — ABNORMAL LOW (ref 0.30–0.70)

## 2019-08-25 SURGERY — RIGHT/LEFT HEART CATH AND CORONARY ANGIOGRAPHY
Anesthesia: LOCAL

## 2019-08-25 MED ORDER — GABAPENTIN 250 MG/5ML PO SOLN
200.0000 mg | Freq: Two times a day (BID) | ORAL | Status: DC
  Administered 2019-08-25 – 2019-08-27 (×2): 200 mg
  Filled 2019-08-25 (×7): qty 4

## 2019-08-25 MED ORDER — SPIRONOLACTONE 12.5 MG HALF TABLET
12.5000 mg | ORAL_TABLET | Freq: Every day | ORAL | Status: DC
  Administered 2019-08-25: 12.5 mg via ORAL
  Filled 2019-08-25: qty 1

## 2019-08-25 MED ORDER — ASPIRIN 81 MG PO CHEW
81.0000 mg | CHEWABLE_TABLET | ORAL | Status: DC
  Filled 2019-08-25: qty 1

## 2019-08-25 MED ORDER — SODIUM CHLORIDE 0.9% IV SOLUTION
Freq: Once | INTRAVENOUS | Status: AC
  Administered 2019-08-25: 10 mL/h via INTRAVENOUS

## 2019-08-25 MED ORDER — SODIUM CHLORIDE 0.9 % IV SOLN
INTRAVENOUS | Status: DC
  Administered 2019-08-25: 22:00:00 via INTRAVENOUS

## 2019-08-25 MED ORDER — FENTANYL CITRATE (PF) 100 MCG/2ML IJ SOLN
INTRAMUSCULAR | Status: AC
  Administered 2019-08-25: 16:00:00 50 ug
  Filled 2019-08-25: qty 2

## 2019-08-25 MED ORDER — PHYTONADIONE 1 MG/0.5 ML ORAL SOLUTION
1.0000 mg | Freq: Once | ORAL | Status: AC
  Administered 2019-08-25: 09:00:00 1 mg via ORAL
  Filled 2019-08-25: qty 0.5

## 2019-08-25 MED ORDER — VITAMIN B-1 100 MG PO TABS
100.0000 mg | ORAL_TABLET | Freq: Every day | ORAL | Status: DC
  Administered 2019-08-26 – 2019-08-28 (×3): 100 mg
  Filled 2019-08-25 (×3): qty 1

## 2019-08-25 MED ORDER — SODIUM CHLORIDE 0.9 % IV SOLN: 250.0000 mL | INTRAVENOUS | Status: DC | PRN

## 2019-08-25 MED ORDER — DIGOXIN 125 MCG PO TABS
0.1250 mg | ORAL_TABLET | Freq: Every day | ORAL | Status: DC
  Administered 2019-08-26 – 2019-08-28 (×3): 0.125 mg
  Filled 2019-08-25 (×3): qty 1

## 2019-08-25 MED ORDER — RIFAXIMIN 550 MG PO TABS
550.0000 mg | ORAL_TABLET | Freq: Two times a day (BID) | ORAL | Status: DC
  Administered 2019-08-26 – 2019-08-28 (×4): 550 mg
  Filled 2019-08-25 (×6): qty 1

## 2019-08-25 MED ORDER — OXYCODONE HCL 5 MG PO TABS
5.0000 mg | ORAL_TABLET | ORAL | Status: DC | PRN
  Administered 2019-08-25 – 2019-08-27 (×2): 5 mg
  Filled 2019-08-25 (×2): qty 1

## 2019-08-25 MED ORDER — ROCURONIUM BROMIDE 50 MG/5ML IV SOLN
50.0000 mg | Freq: Once | INTRAVENOUS | Status: AC
  Administered 2019-08-25: 50 mg via INTRAVENOUS

## 2019-08-25 MED ORDER — ORAL CARE MOUTH RINSE
15.0000 mL | OROMUCOSAL | Status: DC
  Administered 2019-08-25 – 2019-08-26 (×9): 15 mL via OROMUCOSAL

## 2019-08-25 MED ORDER — ASPIRIN 81 MG PO CHEW: 81.0000 mg | CHEWABLE_TABLET | ORAL | Status: DC

## 2019-08-25 MED ORDER — ONDANSETRON HCL 4 MG PO TABS: 4.0000 mg | ORAL_TABLET | Freq: Four times a day (QID) | ORAL | Status: DC | PRN

## 2019-08-25 MED ORDER — ETOMIDATE 2 MG/ML IV SOLN
20.0000 mg | Freq: Once | INTRAVENOUS | Status: AC
  Administered 2019-08-25: 16:00:00 20 mg via INTRAVENOUS

## 2019-08-25 MED ORDER — LACTULOSE 10 GM/15ML PO SOLN
30.0000 g | Freq: Four times a day (QID) | ORAL | Status: DC
  Administered 2019-08-26: 10 g
  Administered 2019-08-26 – 2019-08-29 (×4): 30 g
  Filled 2019-08-25 (×7): qty 45

## 2019-08-25 MED ORDER — MIDAZOLAM HCL 2 MG/2ML IJ SOLN
INTRAMUSCULAR | Status: AC
  Administered 2019-08-25: 16:00:00 1 mg via INTRAVENOUS
  Filled 2019-08-25: qty 2

## 2019-08-25 MED ORDER — CHLORHEXIDINE GLUCONATE 0.12% ORAL RINSE (MEDLINE KIT)
15.0000 mL | Freq: Two times a day (BID) | OROMUCOSAL | Status: DC
  Administered 2019-08-25 – 2019-08-26 (×3): 15 mL via OROMUCOSAL

## 2019-08-25 MED ORDER — SODIUM CHLORIDE 0.9% FLUSH: 3.0000 mL | Freq: Two times a day (BID) | INTRAVENOUS | Status: DC

## 2019-08-25 MED ORDER — SPIRONOLACTONE 12.5 MG HALF TABLET
12.5000 mg | ORAL_TABLET | Freq: Every day | ORAL | Status: DC
  Administered 2019-08-26 – 2019-08-27 (×2): 12.5 mg
  Filled 2019-08-25 (×3): qty 1

## 2019-08-25 MED ORDER — FENTANYL CITRATE (PF) 100 MCG/2ML IJ SOLN
50.0000 ug | INTRAMUSCULAR | Status: DC | PRN
  Administered 2019-08-25 (×5): 50 ug via INTRAVENOUS
  Filled 2019-08-25 (×5): qty 2

## 2019-08-25 MED ORDER — FOLIC ACID 1 MG PO TABS
1.0000 mg | ORAL_TABLET | Freq: Every day | ORAL | Status: DC
  Administered 2019-08-26 – 2019-08-28 (×3): 1 mg
  Filled 2019-08-25 (×3): qty 1

## 2019-08-25 MED ORDER — ACETAMINOPHEN 325 MG PO TABS: 650.0000 mg | ORAL_TABLET | ORAL | Status: DC | PRN

## 2019-08-25 MED ORDER — POTASSIUM CHLORIDE CRYS ER 20 MEQ PO TBCR
40.0000 meq | EXTENDED_RELEASE_TABLET | Freq: Once | ORAL | Status: AC
  Administered 2019-08-25: 07:00:00 40 meq via ORAL
  Filled 2019-08-25: qty 2

## 2019-08-25 MED ORDER — SODIUM CHLORIDE 0.9% FLUSH: 3.0000 mL | INTRAVENOUS | Status: DC | PRN

## 2019-08-25 MED ORDER — FUROSEMIDE 10 MG/ML IJ SOLN
40.0000 mg | Freq: Once | INTRAMUSCULAR | Status: AC
  Administered 2019-08-25: 40 mg via INTRAVENOUS
  Filled 2019-08-25: qty 4

## 2019-08-25 MED ORDER — SODIUM CHLORIDE 0.9 % IV SOLN: INTRAVENOUS | Status: DC

## 2019-08-25 MED ORDER — ADULT MULTIVITAMIN W/MINERALS CH
1.0000 | ORAL_TABLET | Freq: Every day | ORAL | Status: DC
  Administered 2019-08-26 – 2019-09-13 (×18): 1
  Filled 2019-08-25 (×19): qty 1

## 2019-08-25 NOTE — Progress Notes (Signed)
[facility]ology Progress Note  Briana Andrews 53 y.o. 04-14-1966   Subjective: Somnolent; Able to open eyes slowly to command but not able to follow any other commands and not able to say her name or date. Nurse reports she received pain meds about an hour ago for leg pain.  Objective: Vital signs: Vitals:   08/25/19 1110 08/25/19 1115  BP: 95/63   Pulse: 80   Resp: 14 13  Temp: 98.6 F (37 C)   SpO2: 95% 94%    Physical Exam: Gen: somnolent, no acute distress, disheveled HEENT: +icteric sclera CV: RRR Chest: CTA B Abd: soft, nontender, nondistended, +BS Ext:  +LE edema Skin: jaundice  Lab Results: Recent Labs    08/23/19 1723 08/24/19 0849 08/25/19 0437  NA  --  137 136  K 3.7 3.5 3.3*  CL  --  108 108  CO2  --  22 24  GLUCOSE  --  92 111*  BUN  --  45* 33*  CREATININE  --  1.34* 1.17*  CALCIUM  --  7.4* 7.6*  MG 1.7  --   --    Recent Labs    08/23/19 0348 08/24/19 0849  AST 47* 54*  ALT 32 36  ALKPHOS 54 61  BILITOT 5.7* 5.8*  PROT 4.6* 4.8*  ALBUMIN 1.2* 1.2*   Recent Labs    08/24/19 0313  08/24/19 2057 08/25/19 0400  WBC 11.9*  --   --  11.3*  HGB 8.6*   < > 8.7* 7.7*  HCT 25.0*   < > 25.7* 22.6*  MCV 94.0  --   --  96.6  PLT 195  --   --  212   < > = values in this interval not displayed.      Assessment/Plan: Hepatic encephalopathy with alcoholic hepatitis and melenic stools in rectal tubing. Unclear if black stools in tubing is residual from weekend or new but no stool output reported in flowsheets of chart. Severe CHF. Doubt variceal bleed. She would need preop intubation for airway protection prior to an EGD and would continue to manage conservatively with continued PPI IV therapy and aggressive Lactulose and Xifaxan. Would avoid narcotic pain meds. Will have nursing change rectal tubing and report in chart rectal tube output. Will follow.   [name] 08/25/2019, 11:25 AM  Questions please call [phone]  ID: Briana Andrews, female   DOB: 1966-08-23, 53 y.o.   MRN: [id number]

## 2019-08-25 NOTE — Progress Notes (Signed)
Daily Progress Note   Patient Name: Briana Andrews       Date: 08/25/2019 DOB: 02/21/1966  Age: 53 y.o. MRN#: [id number] Attending Physician: Candee Furbish, MD Primary Care Physician: Patient, [name] Admit Date: 08/15/2019  Reason for Consultation/Follow-up: Establishing goals of care  Subjective: Patient in bed, intubated, agitated while RT attempting to draw blood gas. Fentanyl given and this helped calm patient.  Noted plan for EGD tomorrow.    Length of Stay: 9   PPS: 10%     Patient Active Problem List   Diagnosis Date Noted  . Pressure injury of skin 08/23/2019  . Acute upper GI bleed   . Shock circulatory ([place])   . Palliative care by specialist   . Goals of care, counseling/discussion   . Advanced care planning/counseling discussion   . Jaundice   . Cerebral embolism with cerebral infarction 08/17/2019  . PVD (peripheral vascular disease) ([place]) 08/17/2019  . Left ventricular apical thrombus without MI 08/17/2019  . Acute CHF (congestive heart failure) ([place]) 08/17/2019  . Abnormal LFTs 08/16/2019  . Acute metabolic encephalopathy 34/91/7915  . SOB (shortness of breath) 08/16/2019  . AKI (acute kidney injury) ([place]) 08/16/2019  . Fall 08/16/2019  . Lactic acidosis 08/16/2019  . Back pain 08/16/2019  . Liver failure without hepatic coma ([place]) 08/16/2019  . Multifocal pneumonia 08/16/2019  . Alcohol abuse   . Tobacco abuse   . Shortness of breath     Palliative Care Assessment & Plan   Patient Profile: 53 y.o.femalewithpast medical history of admitted on 9/18/2020with weakness and jaundice. Workup during admission has been significant for alcoholic hepatitis with acute failure now with GI bleeding, frontal CVA, heart failure (15% EF) with LV thrombus,  bilateral popliteal artery occlusions s/p thrombectomy today. She has progressed during admission to cardiogenic shock and AKI. Her albumin is significantly decreased at 1.2. She remains severely encephalopathic and hypotensive. Palliative medicine consulted for [place]. She has been made DNR by critical care.  Assessment/Recommendations/Plan   Attempted to call patient's spouse to touch base and provide support- there was no answer  PMT will continue to follow and assist and GOC- will mostly shadow for now until there is a clear place to intervene- spouse had previously set [place] at not wanting to leave patient with "half a life"- providing  interventions if they would return her to a meaningful functional status- would not want her to live out days in a SNF, would not want intensive medical interventions including HD, artificial feeding   Code Status:  DNR  Prognosis:   Unable to determine  Discharge Planning:  To Be Determined  Thank you for allowing the Palliative Medicine Team to assist in the care of this patient.   Time In:  1700 Time Out: 1725 Total Time 25 minutes Prolonged Time Billed no      Greater than 50%  of this time was spent counseling and coordinating care related to the above assessment and plan.  Ocie Bob, AGNP-C Palliative Medicine   Please contact Palliative Medicine Team phone at [phone] for questions and concerns.

## 2019-08-25 NOTE — Progress Notes (Signed)
Called Elink about Current Code status. Pt. Has DNR order but is intubated. No change in order yet.

## 2019-08-25 NOTE — Progress Notes (Signed)
Long discussion with husband over phone regarding disposition.  Patient still having melena and requiring intermittent transfusions while needing AC for her LV clot and recent embolectomy.  She needs an EGD and possible LHC after.  As husband is not really ready for patient to be on hospice we will proceed with EGD in the unit with PCCM managing hemodynamics and vent.  She was intubated without issue and will be monitored overnight.  Tentative plan for EGD in AM by Dr. Paulita Fujita, appreciate help.  Erskine Emery MD

## 2019-08-25 NOTE — Significant Event (Signed)
Type and screen has expired. New sample needed. Unable to locate blood bracelet on patient or in room. New blood bank # obtained and bracelet is placed on patient's right arm. Blood sample sent to lab for type and screen.    Dyon Rotert

## 2019-08-25 NOTE — Progress Notes (Signed)
`  NAME:  Briana Andrews, MRN:  [id number], DOB:  1966-10-08, LOS: 9 ADMISSION DATE:  08/15/2019, CONSULTATION DATE: 9/23 REFERRING MD:  Jearld Pies, CHIEF COMPLAINT:  Post-op shock   Brief History   53 yof admitted 9/19 w/ AMS and lethargy. 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 She was seen by multiple sub specialists including: Neuro, cardiology, vascular surgery and GI. She underwent subsequent mechanical thrombectomy of the lower extremities (the right on 9/22 and the left on 9/23). PCCM asked to see on 9/23 as she was hypotensive    Past Medical History  ETOH and tobacco abuse.   Significant Hospital Events   9/19 admitted. MRI  subacute right frontal ACA territory infarct and small right caudate and cerebellar infarcts.  As part of the work up an echo was obtained. 9/20 ECHO showed: new CM EF 15-20%  W/ large fixed LV thrombus and was started on [facility] and cardiology was consulted. She was also seen by vascular surg w/ concern for ischemic LEs. US showed bilateral total popliteal artery occlusion w/ DVT on right.  GI was consulted for liver failure, heme + stool and dropping hgb.  9/22 for thrombectomy of the RLE   9/23 for left LE thrombectomy of the left post tib and popliteal artery. She returned to the med-surg floor hypotensive and more confused which was why PCCM was consulted. Also having melanotic stool w/ hgb drop so GI starting octreotide and ppi gtt. Transferred to ICU, DNR order placed. Palliative consulted-- no escalation of care  9/24> Improved mentation. Heparin resumed. Continues on dobutamine 9/25> continues on dobutamine 9/26: Remains stable renal function a little improved.  Mental status a little improved.  Protonix and octreotide stopped antibiotics discontinued 9/28: Hemoglobin drifting 1 g from  8.7 down to 7.7, transfused 1 unit.  Remains elevated getting oral vitamin K ready to move out of the intensive care heart failure team titrating dobutamine to off over the next 24 to 48 hours Consults:  Neuro 9/19 Cardiology 9/19 Vascular surgey 9/19 GI  PCCM 9/23 Palliative Care Medicine 9/23   Procedures:  9/22 Mechanical thrombectomy of the right superficial femoral, popliteal, and posterior tibial artery using the penumbra CAT 6 device 9/23 Mechanical thrombectomy of the left popliteal and posterior tibial artery using the Penumbra CAT 6 device Significant Diagnostic Tests:  Echo 9/19: Left Ventricle: Left ventricular ejection fraction, by visual estimation, is 20%. The left ventricle has severely decreased function. There is a large, fixed, apical left ventricular thrombus. Global RV systolic function is has severely reduced systolic function Mri brain 9/19: 1. Gyriform post ischemic appearing enhancement in the right ACA territory, corresponding to most confluent diffusion abnormality earlier today. 2. Faint linear enhancement in the left frontal lobe white matter appears to reflect an area of late subacute ischemia. CT abd nml pancreas, no biliary dilation, no gallstones.   9/24 Vascular US> RLe moderate arterial disease. LLE moderate arterial disease.   Micro Data:  9/19 SARS cov2> neg 9/19 RVP> neg 9/19 BCx> NGTD  Antimicrobials:  levaquin 9/21>>> 9/23 Ceftriaxone 9/23>> 9/26  Interim history/subjective:  Reports her pain is under control today Objective   Blood pressure 116/63, pulse 78, temperature 98.6 F (37 C), temperature source Oral, resp. rate 20, height 5\' 5"  (1.651 m), weight 85.1 kg, SpO2 93 %. CVP:  [  3 mmHg-7 mmHg] 5 mmHg      Intake/Output Summary (Last 24 hours) at 08/25/2019 0844 Last data filed at 08/25/2019 0600 Gross per 24 hour  Intake 1628.07 ml  Output 1860 ml  Net -231.93 ml   Filed Weights   08/23/19 0500 08/24/19 0443 08/25/19 0454   Weight: 81.6 kg 81.9 kg 85.1 kg    Examination: General this chronically ill 53 year old white female she remains encephalopathic lying in bed she is not in acute distress this morning HEENT sclerae remain icteric no JVD right IJ in satisfactory position Pulmonary clear today diminished on the right no accessory use Cardiac regular rate and rhythm Abdomen nontender positive bowel sounds malonic stool GU clear yellow Neuro awake, oriented x1.  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  Seem to be treating SVO 2 at this point I am not sure we need to keep doing this Plan Management per heart failure team  Large LV thrombus  Plan IV heparin  Acute metabolic encephalopathy 2/2 shock, hepatic dysfunction, recent subacute CVA -At risk for EtOH withdrawal  Plan Continue rifaximin and lactulose  Acute GIB Acute blood loss anemia, stable  -GIB etiology r/t EtOH hepatitis vs Hepatic dysfunction 2/2 RV failure  Hemoglobin has drifted down she continues to have melanotic stool, she is down approximately 1 g today Plan Continue IV Protonix twice daily Liquid diet Transfuse per heart failure team   Bilateral pulmonary opacities with moderate size right effusion->s/p treatment for PNA  -PNA vs edema vs atelectasis -wbc ct stable  Plan Supplemental oxygen as needed with pulse oximetry  Suspect the right effusion is a hydrothorax from her underlying liver disease given her coagulopathy and currently being anticoagulated she is not a candidate for thoracentesis    BLE arterial occlusion, s/p bilateral  thrombectomy, it appears as though there is some right lower extremity ischemia Plan Continue IV heparin  Serial circulatory checks  Additional recommendations per vascular surgery  Monitor and treat pain    AKI, improving  -likely multifactorial in setting of shock, IV contrast -Currently renal function improving Plan Trend creatinine Avoid hypotension  Fluid and electrolyte imbalance: Hypokalemia  Plan Replace potassium  Malnutrition in setting of EtOH use disorder Plan Clear liquid diet  Jaundice -Most likely etiology is alcoholic Hepatitis - Possible hepatic dysfunction related to RV dysfunction  Plan Intermittent LFTs  Coagulopathy, mild, in setting of shock and liver dysfunction Plan We will give vitamin K    Goals of Care:  Although her numbers have improved in regards to hemodynamics and renal function her overall exam remains pretty much the same.  She remains encephalopathic, she has intermittent significant discomfort particularly involving the ischemic right leg and she continues to exhibit evidence of GI bleeding.  She is not a candidate for endoscopic intervention, and if she continues to bleed we may not be able to provide further anticoagulation.  Appreciate palliative care assisting with pain management I think this needs to be a priority.  For today we will move her out of the intensive care, continue supportive care in regards to hydration, diet, PPI, treatment of her encephalopathy and even transfuse.  Should she  deteriorate further the goal would be to transition to full comfort.  Still not convinced she will survive this hospitalization.  Simonne Martinet ACNP-BC [facility] Pulmonary/Critical Care Pager # [phone] OR # [phone] if no answer

## 2019-08-25 NOTE — TOC Initial Note (Signed)
Transition of Care [facility]) - Initial/Assessment Note    Patient Details  Name: Briana Andrews MRN: [id number] Date of Birth: 02/18/1966  Transition of Care [facility]) CM/SW Contact:    Midge Minium RN, BSN, NCM-BC, ACM-RN [phone] Phone Number: 08/25/2019, 2:48 PM  Clinical Narrative:                 CM following for dispositional needs. Patient remained somnolent and critically ill. PT recommending CIR; Palliative following for GOC. CM to continue following to assist with DCP needs.   Expected Discharge Plan: IP Rehab Facility Barriers to Discharge: Continued Medical Work up    Expected Discharge Plan and Services Expected Discharge Plan: [place]       Living arrangements for the past 2 months: Single Family Home                  Prior Living Arrangements/Services Living arrangements for the past 2 months: Single Family Home Lives with:: Self, Spouse, Adult Children   Admission diagnosis:  Shortness of breath [R06.02] Liver failure without hepatic coma, unspecified chronicity (HCC) [K72.90] Suspected Covid-19 Virus Infection [R68.89] Abnormal LFTs [R94.5] Patient Active Problem List   Diagnosis Date Noted  . Pressure injury of skin 08/23/2019  . Acute upper GI bleed   . Shock circulatory ([place])   . Palliative care by specialist   . Goals of care, counseling/discussion   . Advanced care planning/counseling discussion   . Jaundice   . Cerebral embolism with cerebral infarction 08/17/2019  . PVD (peripheral vascular disease) ([place]) 08/17/2019  . Left ventricular apical thrombus without MI 08/17/2019  . Acute CHF (congestive heart failure) ([place]) 08/17/2019  . Abnormal LFTs 08/16/2019  . Acute metabolic encephalopathy 86/57/8469  . SOB (shortness of breath) 08/16/2019  . AKI (acute kidney injury) ([place]) 08/16/2019  . Fall 08/16/2019  . Lactic acidosis 08/16/2019  . Back pain 08/16/2019  . Liver failure without hepatic coma ([place]) 08/16/2019  . Multifocal  pneumonia 08/16/2019  . Alcohol abuse   . Tobacco abuse   . Shortness of breath    PCP:  Patient, [name] Pharmacy:   [place], [place] - [address] [place] [address] Phone: [phone] Fax: [phone]     Social Determinants of Health (SDOH) Interventions    Readmission Risk Interventions No flowsheet data found.

## 2019-08-25 NOTE — Progress Notes (Signed)
Physical Therapy Treatment Patient Details Name: Briana Andrews MRN: [id number] DOB: 10/08/1966 Today's Date: 08/25/2019    History of Present Illness 53 year old female with history of smoking, alcohol use, she drinks more than 6 packs of beer every day since last 20 years, no chronic medical issues brought to the emergency room with confusion more than usual and falls.  Since last few months there have been noticing some changes on her including weakness on her legs and poor appetite as much as she stopped smoking and decreased her drinking.  2 weeks ago, she was taken to [facility] with a fall, skeletal survey was negative except L1 fracture with 25% height loss and discharged home on muscle relaxants.  Patient has been intermittently confused since then. In the emergency room, she was found with subacute right ACA stroke, jaundice with bilirubin of 12, bilateral multifocal pneumonia and metabolic encephalopathy.    PT Comments    Pt admitted with above diagnosis. Pt was able to sit EOB with min guard assit for 10 minutes.  Attempts to stand today to [facility] unsuccessful as pt appears to have too much medication and couldnot follow commands.  Also pt was in pain as well.  Had to lay pt back down in bed due to inabiltiy to progress mobility.  Pt currently with functional limitations due to the deficits listed below (see PT Problem List). Pt will benefit from skilled PT to increase their independence and safety with mobility to allow discharge to the venue listed below.     Follow Up Recommendations  CIR     Equipment Recommendations  Other (comment)(TBD)    Recommendations for Other Services Rehab consult     Precautions / Restrictions Precautions Precautions: Fall Restrictions Weight Bearing Restrictions: No    Mobility  Bed Mobility Overal bed mobility: Needs Assistance Bed Mobility: Supine to Sit     Supine to sit: Mod assist;Max assist;+2 for physical  assistance;HOB elevated Sit to supine: Max assist;+2 for physical assistance   General bed mobility comments: step by step sequencing cues needed, assist to pull up at trunk into sitting and to square hips to EOB.  Needed assist with use of pad as well.    Transfers Overall transfer level: Needs assistance Equipment used: Rolling walker (2 wheeled) Transfers: Sit to/from [facility] Sit to Stand: Total assist;+2 physical assistance;From elevated surface         General transfer comment: Pt would not even attempt to stand suspect this was due to meds.  Pt could not clear buttocks.   Ambulation/Gait             General Gait Details: unable to progress due to R foot pain; pt able to take pivotal steps for transfers   Stairs             Wheelchair Mobility    Modified Rankin (Stroke Patients Only) Modified Rankin (Stroke Patients Only) Pre-Morbid Rankin Score: No symptoms Modified Rankin: Moderately severe disability     Balance Overall balance assessment: Needs assistance Sitting-balance support: Feet supported;Bilateral upper extremity supported Sitting balance-Leahy Scale: Fair Sitting balance - Comments: min guard assist EOB with pt sitting about 5 minutes                                     Cognition Arousal/Alertness: Lethargic;Suspect due to medications Behavior During Therapy: Flat affect Overall Cognitive Status: Impaired/Different from baseline Area  of Impairment: Attention;Memory;Safety/judgement;Problem solving;Awareness;Orientation                 Orientation Level: Person;Place;Time;Situation Current Attention Level: Sustained Memory: Decreased short-term memory   Safety/Judgement: Decreased awareness of deficits Awareness: Intellectual Problem Solving: Slow processing;Decreased initiation;Difficulty sequencing;Requires verbal cues General Comments: Had difficulty following commands with poor motor planning   Poor safety awareness as well. Today, question whether meds played a factor in her decr ability to follow commands.       Exercises General Exercises - Lower Extremity Long Arc Quad: AROM;Both;10 reps;Seated    General Comments        Pertinent Vitals/Pain Pain Assessment: Faces Faces Pain Scale: Hurts worst Pain Location: R residual limb and left foot Pain Descriptors / Indicators: Aching;Sore Pain Intervention(s): Limited activity within patient's tolerance;Monitored during session;Premedicated before session;Repositioned;PCA encouraged    Home Living                      Prior Function            PT Goals (current goals can now be found in the care plan section) Progress towards PT goals: Progressing toward goals    Frequency    Min 4X/week      PT Plan Current plan remains appropriate    Co-evaluation              AM-PAC PT "6 Clicks" Mobility   Outcome Measure  Help needed turning from your back to your side while in a flat bed without using bedrails?: A Lot Help needed moving from lying on your back to sitting on the side of a flat bed without using bedrails?: A Lot Help needed moving to and from a bed to a chair (including a wheelchair)?: Total Help needed standing up from a chair using your arms (e.g., wheelchair or bedside chair)?: Total Help needed to walk in hospital room?: Total Help needed climbing 3-5 steps with a railing? : Total 6 Click Score: 8    End of Session Equipment Utilized During Treatment: Gait belt;Oxygen Activity Tolerance: Patient limited by pain;Patient limited by fatigue Patient left: with call bell/phone within reach;in bed;with bed alarm set;with nursing/sitter in room;with SCD's reapplied Nurse Communication: Mobility status;Need for lift equipment PT Visit Diagnosis: Other abnormalities of gait and mobility (R26.89);Pain;Muscle weakness (generalized) (M62.81) Pain - Right/Left: Right Pain - part of body: Ankle  and joints of foot     Time: 6967-8938 PT Time Calculation (min) (ACUTE ONLY): 25 min  Charges:  $Therapeutic Activity: 23-37 mins                     Nesta Kimple,PT Acute Rehabilitation Services Pager:  [phone]  Office:  [phone]     Berline Lopes 08/25/2019, 2:12 PM

## 2019-08-25 NOTE — Significant Event (Signed)
Received information from respiratory therapist that they are preparing for intubation on Briana Andrews. RN is not aware of this plan at this time and follow up with Dr. Tamala Julian who is to perform the intubation. Per Dr. Tamala Julian, he has spoken with patient's spouse who is in agreement with the intubation. RN address with Dr. Tamala Julian about patient's current code status as patient is currently a DNR. Dr. Tamala Julian stated he will be putting in orders for a modified code status. RN updated and follow up with patient regarding the intubation; however patient is drowsy and is oriented to self only. RN called patient's spouse who verbalize that he is in agreement with this plan for intubation in prep for EGD tomorrow.    [name]

## 2019-08-25 NOTE — Progress Notes (Signed)
Patient ID: Briana Andrews, female   DOB: 1966/05/13, 53 y.o.   MRN: [id number]     Advanced Heart Failure Rounding Note  PCP-Cardiologist: Tobias Alexander, MD   Subjective:    Remains on dobutamine 2.5 mcg. Co-ox 74%  CVP 5.   Confusion improved today, knows she is at [facility] and thinks it is 2019. She remains on lactulose/rifaxamin. Eating breakfast (clear liquids).    On heparin. Still with melena, hgb 8.6 => 7.7.    Objective:   Weight Range: 85.1 kg Body mass index is 31.22 kg/m.   Vital Signs:   Temp:  [96.6 F (35.9 C)-98.7 F (37.1 C)] 98.7 F (37.1 C) (09/28 0400) Pulse Rate:  [78] 78 (09/27 1059) Resp:  [12-22] 20 (09/28 0700) BP: (99-123)/(44-69) 116/63 (09/28 0700) SpO2:  [83 %-99 %] 93 % (09/28 0700) Weight:  [85.1 kg] 85.1 kg (09/28 0454) Last BM Date: 08/24/19  Weight change: Filed Weights   08/23/19 0500 08/24/19 0443 08/25/19 0454  Weight: 81.6 kg 81.9 kg 85.1 kg    Intake/Output:   Intake/Output Summary (Last 24 hours) at 08/25/2019 0730 Last data filed at 08/25/2019 0600 Gross per 24 hour  Intake 1778.17 ml  Output 1865 ml  Net -86.83 ml      Physical Exam    General: Mild confusion.  Neck: No JVD, no thyromegaly or thyroid nodule.  Lungs: Decreased BS at bases.  CV: Nondisplaced PMI.  Heart regular S1/S2, no S3/S4, no murmur.  1+ edema 1/2 to knees bilaterally.   Abdomen: Soft, nontender, no hepatosplenomegaly, no distention.  Skin: Jaundice.  Neurologic: Alert and oriented to person/place, still off on date (2019).  Psych: Normal affect. Extremities: No clubbing or cyanosis. Right foot with ischemic changes.  HEENT: Normal.     Telemetry   NSR 80s personally reviewed.   Labs    CBC Recent Labs    08/24/19 0313  08/24/19 2057 08/25/19 0400  WBC 11.9*  --   --  11.3*  HGB 8.6*   < > 8.7* 7.7*  HCT 25.0*   < > 25.7* 22.6*  MCV 94.0  --   --  96.6  PLT 195  --   --  212   < > = values in this interval not displayed.   Basic Metabolic Panel Recent Labs    81/44/81 1723 08/24/19 0849 08/25/19 0437  NA  --  137 136  K 3.7 3.5 3.3*  CL  --  108 108  CO2  --  22 24  GLUCOSE  --  92 111*  BUN  --  45* 33*  CREATININE  --  1.34* 1.17*  CALCIUM  --  7.4* 7.6*  MG 1.7  --   --    Liver Function Tests Recent Labs    08/23/19 0348 08/24/19 0849  AST 47* 54*  ALT 32 36  ALKPHOS 54 61  BILITOT 5.7* 5.8*  PROT 4.6* 4.8*  ALBUMIN 1.2* 1.2*   No results for input(s): LIPASE, AMYLASE in the last 72 hours. Cardiac Enzymes No results for input(s): CKTOTAL, CKMB, CKMBINDEX, TROPONINI in the last 72 hours.  BNP: BNP (last 3 results) No results for input(s): BNP in the last 8760 hours.  ProBNP (last 3 results) No results for input(s): PROBNP in the last 8760 hours.   D-Dimer No results for input(s): DDIMER in the last 72 hours. Hemoglobin A1C No results for input(s): HGBA1C in the last 72 hours. Fasting Lipid Panel No results for input(s): CHOL,  HDL, LDLCALC, TRIG, CHOLHDL, LDLDIRECT in the last 72 hours. Thyroid Function Tests No results for input(s): TSH, T4TOTAL, T3FREE, THYROIDAB in the last 72 hours.  Invalid input(s): FREET3  Other results:   Imaging    No results found.   Medications:     Scheduled Medications: . sodium chloride   Intravenous Once  . chlorhexidine  15 mL Mouth/Throat BID  . Chlorhexidine Gluconate Cloth  6 each Topical Daily  . digoxin  0.125 mg Oral Daily  . folic acid  1 mg Oral Daily  . furosemide  40 mg Intravenous Once  . gabapentin  200 mg Oral BID  . lactulose  30 g Oral Q6H  . multivitamin with minerals  1 tablet Oral Daily  . nicotine  21 mg Transdermal Daily  . pantoprazole  40 mg Intravenous Q12H  . phytonadione  1 mg Oral Once  . rifaximin  550 mg Oral BID  . sodium chloride flush  10 mL Intravenous Q12H  . sodium chloride flush  10 mL Intravenous Q12H  . spironolactone  12.5 mg Oral Daily  . thiamine  100 mg Oral Daily    Infusions:  . sodium chloride 10 mL/hr at 08/23/19 0800  . DOBUTamine 2.5 mcg/kg/min (08/24/19 1300)  . heparin 1,250 Units/hr (08/25/19 0600)    PRN Medications: sodium chloride, acetaminophen, albuterol, dextromethorphan-guaiFENesin, fentaNYL (SUBLIMAZE) injection, hydrALAZINE, labetalol, ondansetron (ZOFRAN) IV, ondansetron **OR** [DISCONTINUED] ondansetron (ZOFRAN) IV, oxyCODONE, sodium chloride flush, sodium chloride flush, sodium chloride flush   Assessment/Plan   1. Acute on chronic systolic CHF/cardiogenic shock: Echo with EF 15-20%, mild LV dilation, LV thrombus, mild-moderate MR, severely decreased RV systolic function. Cause of cardiomyopathy is uncertain, but she was a heavy smoker and ECG is suggestive of possible old ASMI. Ischemic cardiomyopathy is possible, alternatively could be due to ETOH abuse (>6 pack/day per husband) or stress cardiomyopathy with critical illness.  Lactate was elevated at admission.  Suspect cardiogenic shock with low BP, cannot rule out component of septic shock. Currently on dobutamine 2.5 with stable co-ox (48% pre-dobutamine, 74% this morning) and stable MAP.   - CVP 5 today. Will give Lasix 40 mg IV x 1 with blood today.  - Start spironolactone 12.5 mg daily and replace K.  - Continue dobutamine at 2.5 and digoxin - No invasive evaluation at this time with ongoing melena and high INR.  - Not candidate for advanced therapies - After discussions with her husband CCM and GI recommending Hospice [facility]. She is now DNR.  2. 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 Markedly elevated bilirubin initially with jaundice.  However liver imaging with [place] and CT has NOT shown cirrhosis or portal/hepatic vein thrombosis. Question has arisen whether this might possibly be due to primarily to RV failure.  Time course seems quite acute for RV failure.  - holding stain and other hepato-toxic agents  3. Encephalopathy: ?Cause.  NH3 has not been markedly high but certainly  could be hepatic encephalopathy.  Shock likely played a role.  Less confusion this morning.  - Continue lactulose/rifaxamin per GI.  4. AKI: Creatinine improved to 1.17 with inotropic support.  Suspect cardiorenal syndrome, also had contrast with LE angiograms so possible component of contrast nephropathy.  - Support BP and cardiac output.  5. LV thrombus: Possible cause of embolic event to popliteal arteries as well as subacute CVA.  Heparin gtt held with active GI bleeding initially, then restarted 9/24. INR up to 4.0 with poor nutrition. Still with ongoing  melena but has slowed some - With embolic events recently, will continue heparin gtt at lowest therapeutic range.  - Will give gentle vitamin K to bring down INR.  6. PAD: Bilateral popliteal artery occlusions, suspect cardioembolic from LV thrombus.  Now s/p mechanical thrombectomy by vascular. Right foot still looks ischemic but dopplerable pulses.  7. 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  Treatment will be anticoagulation.  8. Right popliteal vein DVT: Anticoagulated.  9. GI bleeding: Upper GI bleeding, ?PUD versus varices given liver disease.  Still with melena in rectal bag today.  Ongoing need for transfusions. Not on asa with bleeding. Hgb 7.7 today.  - Off octreotide, continue Protonix.   - Will transfuse 1 unit PRBCs today with 1 mg Vitamin K (elevated INR with nutritional deficiency and liver failure).  - GI has not thought she is candidate for scope yet.  She is less confused today, hopefully can get to the point where she can have EGD.  10. ETOH abuse: Per husband, at least a 6 pack daily.  11. ID: Concern for aspiration PNA. - Completed course of ceftriaxone.  12. DNR  She remains critically ill with mult-system organ failure. Suspect cardiomyopathy likely due to critical illness in setting of probable ESLD and MSOF but may be ischemic in nature. With ongoing bleeding options quite limited. She is now  DNR.   CRITICAL CARE Performed by: Marca Ancona  Total critical care time: 35 minutes  Critical care time was exclusive of separately billable procedures and treating other patients.  Critical care was necessary to treat or prevent imminent or life-threatening deterioration.  Critical care was time spent personally by me (independent of midlevel providers or residents) on the following activities: development of treatment plan with patient and/or surrogate as well as nursing, discussions with consultants, evaluation of patient's response to treatment, examination of patient, obtaining history from patient or surrogate, ordering and performing treatments and interventions, ordering and review of laboratory studies, ordering and review of radiographic studies, pulse oximetry and re-evaluation of patient's condition.    Length of Stay: 9  Marca Ancona, MD  08/25/2019, 7:30 AM  Advanced Heart Failure Team Pager [phone] (M-F; 7a - 4p)  Please contact [facility] for night-coverage after hours (4p -7a ) and weekends on [email or web address]

## 2019-08-25 NOTE — Progress Notes (Signed)
ANTICOAGULATION CONSULT NOTE - Follow Up Consult  Pharmacy Consult for Heparin Indication: DVT, LV thrombus, and acute CVA  Allergies  Allergen Reactions  . Penicillins Other (See Comments)    Unsure of reaction Did it involve swelling of the face/tongue/throat, SOB, or low BP? Unknown Did it involve sudden or severe rash/hives, skin peeling, or any reaction on the inside of your mouth or nose? Unknown Did you need to seek medical attention at a hospital or doctor's office? Unknown When did it last happen?[place] If all above answers are "NO", may proceed with cephalosporin use.    Patient Measurements: Height: 5\' 5"  (165.1 cm) Weight: 187 lb 9.8 oz (85.1 kg) IBW/kg (Calculated) : 57 Heparin Dosing Weight: 75 kg  Vital Signs: Temp: 98.2 F (36.8 C) (09/28 1130) Temp Source: Axillary (09/28 1130) BP: 105/59 (09/28 1130) Pulse Rate: 79 (09/28 1130)  Labs: Recent Labs    08/23/19 0348  08/24/19 0313 08/24/19 0849 08/24/19 1433 08/24/19 2057 08/25/19 0400 08/25/19 0411 08/25/19 0437  HGB 8.5*   < > 8.6*  --  8.3* 8.7* 7.7*  --   --   HCT 24.1*   < > 25.0*  --  24.1* 25.7* 22.6*  --   --   PLT 152  --  195  --   --   --  212  --   --   LABPROT  --   --   --  37.7*  --   --   --   --  38.5*  INR  --   --   --  3.9*  --   --   --   --  4.0*  HEPARINUNFRC 0.21*   < > 0.28*  --  0.30  --   --  0.29*  --   CREATININE 1.50*  --   --  1.34*  --   --   --   --  1.17*   < > = values in this interval not displayed.    Estimated Creatinine Clearance: 59.2 mL/min (A) (by C-G formula based on SCr of 1.17 mg/dL (H)).   Medications:  Scheduled:  . [START ON 08/26/2019] aspirin  81 mg Oral Pre-Cath  . chlorhexidine  15 mL Mouth/Throat BID  . Chlorhexidine Gluconate Cloth  6 each Topical Daily  . digoxin  0.125 mg Oral Daily  . folic acid  1 mg Oral Daily  . gabapentin  200 mg Oral BID  . lactulose  30 g Oral Q6H  . multivitamin with minerals  1 tablet Oral Daily  .  nicotine  21 mg Transdermal Daily  . pantoprazole  40 mg Intravenous Q12H  . rifaximin  550 mg Oral BID  . sodium chloride flush  10 mL Intravenous Q12H  . sodium chloride flush  10 mL Intravenous Q12H  . sodium chloride flush  3 mL Intravenous Q12H  . spironolactone  12.5 mg Oral Daily  . thiamine  100 mg Oral Daily   Infusions:  . sodium chloride 10 mL/hr at 08/23/19 0800  . sodium chloride    . sodium chloride    . DOBUTamine 2.5 mcg/kg/min (08/24/19 1300)  . heparin 1,250 Units/hr (08/25/19 1000)    Assessment: [name] on heparin for LV thrombus and bilateral lower extremity ischemia w/ right popliteal vein DVT now s/p angioplasty and R/L popliteal thrombectomy of the tibial artery. Heparin therapy complicated by acute CVA and recent GI bleed and need for lower goal. Plans noted for coumadin when  no further procedures   She is s/p mech thrombectomy of L popliteal 9/23. Heparin stopped 09/23 @ 1750 due to a GI bleed. Pt was started on octreotide and IV protonix. Diffuse melena noted in rectal bag 9/26. RN stated patient did begin menstruation 9/26. Pt still experiencing melena, however heparin to continue due to LV thrombus per Dr. Aundra Dubin. H/H dropped by 1 unit and is low at 7.7/22.6.  Heparin level is slightly low at 0.29   Goal of Therapy:  Heparin level 0.3-0.5 units/ml Monitor platelets by anticoagulation protocol: Yes   Plan:  -Continue heparin 1250 units/hr - will not titrate heparin given previous bleeding issues. -Daily heparin level and CBC - Monitor for signs/symptoms of bleeding   Sherren Kerns, PharmD PGY1 [place] Resident [phone] Please check AMION for all [facility] numbers 08/25/2019

## 2019-08-25 NOTE — Progress Notes (Signed)
Called to pickup Mrs. Defina on 9/28 to [facility] service, but called back by PCCM who noted she's going to remain on their service for now.  They'll call back when needed.  Please call us when needed.  Thanks.

## 2019-08-25 NOTE — H&P (View-Only) (Signed)
[facility] Progress Note  Anjelita Rogacki 53 y.o. 06/04/1966   Subjective: Somnolent; Able to open eyes slowly to command but not able to follow any other commands and not able to say her name or date. Nurse reports she received pain meds about an hour ago for leg pain.  Objective: Vital signs: Vitals:   08/25/19 1110 08/25/19 1115  BP: 95/63   Pulse: 80   Resp: 14 13  Temp: 98.6 F (37 C)   SpO2: 95% 94%    Physical Exam: Gen: somnolent, no acute distress, disheveled HEENT: +icteric sclera CV: RRR Chest: CTA B Abd: soft, nontender, nondistended, +BS Ext:  +LE edema Skin: jaundice  Lab Results: Recent Labs    08/23/19 1723 08/24/19 0849 08/25/19 0437  NA  --  137 136  K 3.7 3.5 3.3*  CL  --  108 108  CO2  --  22 24  GLUCOSE  --  92 111*  BUN  --  45* 33*  CREATININE  --  1.34* 1.17*  CALCIUM  --  7.4* 7.6*  MG 1.7  --   --    Recent Labs    08/23/19 0348 08/24/19 0849  AST 47* 54*  ALT 32 36  ALKPHOS 54 61  BILITOT 5.7* 5.8*  PROT 4.6* 4.8*  ALBUMIN 1.2* 1.2*   Recent Labs    08/24/19 0313  08/24/19 2057 08/25/19 0400  WBC 11.9*  --   --  11.3*  HGB 8.6*   < > 8.7* 7.7*  HCT 25.0*   < > 25.7* 22.6*  MCV 94.0  --   --  96.6  PLT 195  --   --  212   < > = values in this interval not displayed.      Assessment/Plan: Hepatic encephalopathy with alcoholic hepatitis and melenic stools in rectal tubing. Unclear if black stools in tubing is residual from weekend or new but no stool output reported in flowsheets of chart. Severe CHF. Doubt variceal bleed. She would need preop intubation for airway protection prior to an EGD and would continue to manage conservatively with continued PPI IV therapy and aggressive Lactulose and Xifaxan. Would avoid narcotic pain meds. Will have nursing change rectal tubing and report in chart rectal tube output. Will follow.   Odilia Damico C Camille Dragan 08/25/2019, 11:25 AM  Questions please call [phone]  ID: Kippy Harrel, female   DOB: 06/28/1966, 53 y.o.   MRN: [id number]  

## 2019-08-26 ENCOUNTER — Encounter (HOSPITAL_COMMUNITY): Payer: Self-pay | Admitting: Emergency Medicine

## 2019-08-26 ENCOUNTER — Encounter (HOSPITAL_COMMUNITY)
Admission: EM | Disposition: A | Discharge status: Skilled Nursing Facility | Payer: Self-pay | Source: Home / Self Care | Attending: Internal Medicine

## 2019-08-26 DIAGNOSIS — K922 Gastrointestinal hemorrhage, unspecified: Secondary | ICD-10-CM | POA: Diagnosis not present

## 2019-08-26 HISTORY — PX: HOT HEMOSTASIS: SHX5433

## 2019-08-26 HISTORY — PX: ESOPHAGOGASTRODUODENOSCOPY: SHX5428

## 2019-08-26 HISTORY — PX: SCLEROTHERAPY: SHX6841

## 2019-08-26 LAB — COOXEMETRY PANEL
Carboxyhemoglobin: 1.3 % (ref 0.5–1.5)
Methemoglobin: 0.8 % (ref 0.0–1.5)
O2 Saturation: 79.9 %
Total hemoglobin: 9.8 g/dL — ABNORMAL LOW (ref 12.0–16.0)

## 2019-08-26 LAB — BASIC METABOLIC PANEL
Anion gap: 7 (ref 5–15)
BUN: 27 mg/dL — ABNORMAL HIGH (ref 6–20)
CO2: 22 mmol/L (ref 22–32)
Calcium: 7.3 mg/dL — ABNORMAL LOW (ref 8.9–10.3)
Chloride: 107 mmol/L (ref 98–111)
Creatinine, Ser: 1.09 mg/dL — ABNORMAL HIGH (ref 0.44–1.00)
GFR calc Af Amer: >60 mL/min (ref >60)
GFR calc non Af Amer: 58 mL/min — ABNORMAL LOW (ref >60)
Glucose, Bld: 106 mg/dL — ABNORMAL HIGH (ref 70–99)
Potassium: 3.4 mmol/L — ABNORMAL LOW (ref 3.5–5.1)
Sodium: 136 mmol/L (ref 135–145)

## 2019-08-26 LAB — TRIGLYCERIDES: Triglycerides: 165 mg/dL — ABNORMAL HIGH (ref <150)

## 2019-08-26 LAB — CBC
HCT: 28.2 % — ABNORMAL LOW (ref 36.0–46.0)
Hemoglobin: 9.6 g/dL — ABNORMAL LOW (ref 12.0–15.0)
MCH: 31.8 pg (ref 26.0–34.0)
MCHC: 34 g/dL (ref 30.0–36.0)
MCV: 93.4 fL (ref 80.0–100.0)
Platelets: 278 10*3/uL (ref 150–400)
RBC: 3.02 MIL/uL — ABNORMAL LOW (ref 3.87–5.11)
RDW: 20.7 % — ABNORMAL HIGH (ref 11.5–15.5)
WBC: 12.3 10*3/uL — ABNORMAL HIGH (ref 4.0–10.5)
nRBC: 0.2 % (ref 0.0–0.2)

## 2019-08-26 LAB — HEMOGLOBIN AND HEMATOCRIT, BLOOD
HCT: 28.1 % — ABNORMAL LOW (ref 36.0–46.0)
HCT: 23.7 % — ABNORMAL LOW (ref 36.0–46.0)
Hemoglobin: 9.3 g/dL — ABNORMAL LOW (ref 12.0–15.0)
Hemoglobin: 8 g/dL — ABNORMAL LOW (ref 12.0–15.0)

## 2019-08-26 LAB — MAGNESIUM: Magnesium: 1.5 mg/dL — ABNORMAL LOW (ref 1.7–2.4)

## 2019-08-26 LAB — HEPARIN LEVEL (UNFRACTIONATED): Heparin Unfractionated: 0.41 IU/mL (ref 0.30–0.70)

## 2019-08-26 SURGERY — EGD (ESOPHAGOGASTRODUODENOSCOPY)
Anesthesia: Moderate Sedation

## 2019-08-26 MED ORDER — FENTANYL CITRATE (PF) 100 MCG/2ML IJ SOLN
50.0000 ug | Freq: Once | INTRAMUSCULAR | Status: AC
  Administered 2019-08-26: 50 ug via INTRAVENOUS

## 2019-08-26 MED ORDER — POTASSIUM CHLORIDE CRYS ER 20 MEQ PO TBCR: 40.0000 meq | EXTENDED_RELEASE_TABLET | Freq: Once | ORAL | Status: DC

## 2019-08-26 MED ORDER — MAGNESIUM SULFATE 2 GM/50ML IV SOLN
INTRAVENOUS | Status: AC
  Filled 2019-08-26: qty 50

## 2019-08-26 MED ORDER — SODIUM CHLORIDE 0.9 % IV SOLN
80.0000 mg | Freq: Once | INTRAVENOUS | Status: AC
  Administered 2019-08-26: 80 mg via INTRAVENOUS
  Filled 2019-08-26: qty 80

## 2019-08-26 MED ORDER — FENTANYL CITRATE (PF) 100 MCG/2ML IJ SOLN
INTRAMUSCULAR | Status: AC
  Filled 2019-08-26: qty 2

## 2019-08-26 MED ORDER — SODIUM CHLORIDE (PF) 0.9 % IJ SOLN
PREFILLED_SYRINGE | INTRAMUSCULAR | Status: DC | PRN
  Administered 2019-08-26: 10 mL

## 2019-08-26 MED ORDER — SODIUM CHLORIDE 0.9 % IV SOLN
8.0000 mg/h | INTRAVENOUS | Status: AC
  Administered 2019-08-26 – 2019-08-29 (×6): 8 mg/h via INTRAVENOUS
  Filled 2019-08-26 (×9): qty 80

## 2019-08-26 MED ORDER — PANTOPRAZOLE SODIUM 40 MG IV SOLR
40.0000 mg | Freq: Two times a day (BID) | INTRAVENOUS | Status: DC
  Administered 2019-08-29 – 2019-09-02 (×8): 40 mg via INTRAVENOUS
  Filled 2019-08-26 (×9): qty 40

## 2019-08-26 MED ORDER — NOREPINEPHRINE 4 MG/250ML-% IV SOLN
0.0000 ug/min | INTRAVENOUS | Status: DC
  Administered 2019-08-26: 15 ug/min via INTRAVENOUS
  Filled 2019-08-26: qty 250

## 2019-08-26 MED ORDER — MIDAZOLAM HCL 2 MG/2ML IJ SOLN
INTRAMUSCULAR | Status: AC
  Filled 2019-08-26: qty 2

## 2019-08-26 MED ORDER — NOREPINEPHRINE 4 MG/250ML-% IV SOLN
INTRAVENOUS | Status: AC
  Filled 2019-08-26: qty 250

## 2019-08-26 MED ORDER — ROCURONIUM BROMIDE 50 MG/5ML IV SOLN
50.0000 mg | Freq: Once | INTRAVENOUS | Status: AC
  Administered 2019-08-26: 50 mg via INTRAVENOUS

## 2019-08-26 MED ORDER — ORAL CARE MOUTH RINSE
15.0000 mL | Freq: Two times a day (BID) | OROMUCOSAL | Status: DC
  Administered 2019-08-26 – 2019-09-13 (×20): 15 mL via OROMUCOSAL

## 2019-08-26 MED ORDER — MAGNESIUM SULFATE 4 GM/100ML IV SOLN
4.0000 g | Freq: Once | INTRAVENOUS | Status: AC
  Administered 2019-08-26: 4 g via INTRAVENOUS
  Filled 2019-08-26: qty 100

## 2019-08-26 MED ORDER — PROPOFOL 1000 MG/100ML IV EMUL
5.0000 ug/kg/min | INTRAVENOUS | Status: DC
  Administered 2019-08-26: 09:00:00 10 ug/kg/min via INTRAVENOUS

## 2019-08-26 MED ORDER — PROPOFOL 500 MG/50ML IV EMUL
INTRAVENOUS | Status: AC
  Administered 2019-08-26: 09:00:00
  Filled 2019-08-26: qty 50

## 2019-08-26 MED ORDER — MIDAZOLAM HCL 2 MG/2ML IJ SOLN
2.0000 mg | Freq: Once | INTRAMUSCULAR | Status: AC
  Administered 2019-08-26: 09:00:00 2 mg via INTRAVENOUS

## 2019-08-26 MED ORDER — BOOST / RESOURCE BREEZE PO LIQD CUSTOM
1.0000 | Freq: Three times a day (TID) | ORAL | Status: DC
  Administered 2019-08-26 – 2019-09-07 (×19): 1 via ORAL
  Filled 2019-08-26: qty 1

## 2019-08-26 MED ORDER — ETOMIDATE 2 MG/ML IV SOLN
10.0000 mg | Freq: Once | INTRAVENOUS | Status: AC
  Administered 2019-08-26: 10 mg via INTRAVENOUS

## 2019-08-26 NOTE — Brief Op Note (Signed)
  Bleeding pyloric channel ulcer and a bleeding duodenal bulb ulcer with an adherent clot. No esophageal or gastric varices seen. Bleeding stopped with epinephrine:saline injection and bicap gold probe to the duodenal ulcer. See endopro note for details. Patient intubated prior to the procedure by CCM for airway protection. Protonix drip started. Updated family by phone.

## 2019-08-26 NOTE — Op Note (Signed)
[facility] Patient Name: Briana Andrews Procedure Date : 08/26/2019 MRN: [id number] Attending MD: [name] , MD Date of Birth: March 04, 1966 CSN: [id number] Age: 53 Admit Type: Inpatient Procedure:                Upper GI endoscopy Indications:              Suspected upper gastrointestinal bleeding Providers:                [name], MD, Ashley Jacobs, RN, Ladona Ridgel, Technician, Elspeth Cho, Technician,                            Lina Sar, Technician Referring MD:             hospital team Medicines:                Propofol drip per CCM team Complications:            No immediate complications. Estimated Blood Loss:     Estimated blood loss was minimal. Procedure:                Pre-Anesthesia Assessment:                           - Prior to the procedure, a History and Physical                            was performed, and patient medications and                            allergies were reviewed. The patient's tolerance of                            previous anesthesia was also reviewed. The risks                            and benefits of the procedure and the sedation                            options and risks were discussed with the patient.                            All questions were answered, and informed consent                            was obtained. Prior Anticoagulants: The patient has                            taken no previous anticoagulant or antiplatelet                            agents. ASA Grade Assessment: IV - A patient with  severe systemic disease that is a constant threat                            to life. After reviewing the risks and benefits,                            the patient was deemed in satisfactory condition to                            undergo the procedure.                           After obtaining informed consent, the endoscope was                     passed under direct vision. Throughout the                            procedure, the patient's blood pressure, pulse, and                            oxygen saturations were monitored continuously. The                            GIF-H190 ([id number] Olympus gastroscope was                            introduced through the mouth, and advanced to the                            second part of duodenum. The upper GI endoscopy was                            performed with difficulty due to excessive                            bleeding. Successful completion of the procedure                            was aided by straightening and shortening the scope                            to obtain bowel loop reduction and controlling the                            bleeding. The patient tolerated the procedure well. Scope In: Scope Out: Findings:      LA Grade D (one or more mucosal breaks involving at least 75% of       esophageal circumference) esophagitis with bleeding was found in the mid       esophagus.      LA Grade D (one or more mucosal breaks involving at least 75% of       esophageal circumference) esophagitis with bleeding was found in the       distal esophagus.      The  Z-line was found 40 cm from the incisors.      One oozing cratered gastric ulcer was found in the prepyloric region of       the stomach and at the pylorus. The lesion was 10 mm in largest       dimension. Area was successfully injected with 4 mL of a 1:10,000       solution of epinephrine for hemostasis. Estimated blood loss was minimal.      Segmental severe inflammation characterized by congestion (edema),       erosions and erythema was found in the gastric antrum.      The cardia and gastric fundus were normal on retroflexion.      There is no endoscopic evidence of varices in the entire esophagus.      One oozing cratered duodenal ulcer with adherent clot was found in the       duodenal bulb. The  lesion was 8 mm in largest dimension. Area was       successfully injected with 6 mL of a 1:10,000 solution of epinephrine       for hemostasis. Estimated blood loss was minimal. Fulguration to stop       the bleeding by bipolar probe was successful. Estimated blood loss: none.      Few non-bleeding linear duodenal ulcers with no stigmata of bleeding       were found in the duodenal bulb. The largest lesion was 10 mm in largest       dimension.      Normal mucosa was found in the second portion of the duodenum. Impression:               - LA Grade D reflux esophagitis.                           - Z-line, 40 cm from the incisors.                           - Oozing gastric ulcer. Injected.                           - Acute gastritis.                           - Oozing duodenal ulcer with adherent clot.                            Injected. Treated with bipolar cautery.                           - Non-bleeding duodenal ulcers with no stigmata of                            bleeding.                           - Normal mucosa was found in the second portion of                            the duodenum.                           -  No specimens collected. Recommendation:           - NPO.                           - Observe patient's clinical course.                           - Give Protonix (pantoprazole): initiate therapy                            with 80 mg IV bolus, then 8 mg/hr IV by continuous                            infusion. Procedure Code(s):        --- Professional ---                           [phone], Esophagogastroduodenoscopy, flexible,                            transoral; with control of bleeding, any method Diagnosis Code(s):        --- Professional ---                           K25.4, Chronic or unspecified gastric ulcer with                            hemorrhage                           K26.4, Chronic or unspecified duodenal ulcer with                            hemorrhage                            K21.0, Gastro-esophageal reflux disease with                            esophagitis                           K29.00, Acute gastritis without bleeding                           K26.9, Duodenal ulcer, unspecified as acute or                            chronic, without hemorrhage or perforation CPT copyright 2019 American [employer] Association. All rights reserved. The codes documented in this report are preliminary and upon coder review may  be revised to meet current compliance requirements. Shirley Friar, MD 08/26/2019 9:35:31 AM This report has been signed electronically. Number of Addenda: 0

## 2019-08-26 NOTE — Procedures (Signed)
Extubation Procedure Note  Patient Details:   Name: Briana Andrews DOB: 05/23/1966 MRN: [id number]   Airway Documentation:    Vent end date: 08/26/19 Vent end time: 1410   Evaluation  O2 sats: stable throughout Complications: No apparent complications Patient did tolerate procedure well. Bilateral Breath Sounds: Diminished, Rhonchi   Yes   Pt extubated per physician order with RN at bedside. Pt suctioned orally and via ETT prior. Positive cuff leak. Pt placed on 5L nasal cannula. Fair cough, able to speak name and no stridor was heard. RT will continue to monitor.   Sharla Kidney 08/26/2019, 2:17 PM

## 2019-08-26 NOTE — Progress Notes (Signed)
During report both oncoming and leaving nurse were at computer when pt. Self extubated. Pt's O2  95%, oriented X4 Dr. Tamala Julian alerted.  No orders at this time.

## 2019-08-26 NOTE — Procedures (Signed)
Intubation Procedure Note Briana Andrews [id number] Dec 04, 1965  Procedure: Intubation Indications: Airway protection and maintenance  Procedure Details Consent: Risks of procedure as well as the alternatives and risks of each were explained to the (patient/caregiver).  Consent for procedure obtained. Time Out: Verified patient identification, verified procedure, site/side was marked, verified correct patient position, special equipment/implants available, medications/allergies/relevent history reviewed, required imaging and test results available.  Performed  Maximum sterile technique was used including antiseptics, cap, gloves, gown, hand hygiene, mask and sheet.  3    Evaluation Hemodynamic Status: BP stable throughout; O2 sats: stable throughout Patient's Current Condition: stable Complications: No apparent complications Patient did tolerate procedure well. Chest X-ray ordered to verify placement.  CXR: pending.   Candee Furbish 08/26/2019

## 2019-08-26 NOTE — Progress Notes (Addendum)
Patient ID: Briana Andrews, female   DOB: Sep 24, 1966, 53 y.o.   MRN: [id number]     Advanced Heart Failure Rounding Note  PCP-Cardiologist: Ena Dawley, MD   Subjective:    Remains on dobutamine 2.5 mcg. Self extubated this morning.   Denies SOB.    Objective:   Weight Range: 80.2 kg Body mass index is 29.42 kg/m.   Vital Signs:   Temp:  [98.2 F (36.8 C)-99.4 F (37.4 C)] 99.3 F (37.4 C) (09/29 0445) Pulse Rate:  [79-85] 85 (09/28 1610) Resp:  [11-26] 17 (09/29 0600) BP: (83-139)/(51-96) 112/57 (09/29 0600) SpO2:  [92 %-100 %] 100 % (09/29 0600) FiO2 (%):  [60 %-100 %] 60 % (09/29 0400) Weight:  [80.2 kg] 80.2 kg (09/29 0500) Last BM Date: 08/25/19  Weight change: Filed Weights   08/24/19 0443 08/25/19 0454 08/26/19 0500  Weight: 81.9 kg 85.1 kg 80.2 kg    Intake/Output:   Intake/Output Summary (Last 24 hours) at 08/26/2019 0726 Last data filed at 08/26/2019 0600 Gross per 24 hour  Intake 1737.02 ml  Output 3375 ml  Net -1637.98 ml      Physical Exam   CVP 4-5  General:  Remains confused.  No resp difficulty. Appears c HEENT: normal Neck: supple.no JVD. Carotids 2+ bilat; no bruits. No lymphadenopathy or thryomegaly appreciated. Cor: PMI nondisplaced. Regular rate & rhythm. No rubs, gallops or murmurs. Lungs: clear Abdomen: soft, nontender, nondistended. No hepatosplenomegaly. No bruits or masses. Good bowel sounds. Extremities: no cyanosis, clubbing, rash, R and LLE 1+ edema. R foot ischemic toes.  Neuro: alert & orientedx2, cranial nerves grossly intact. moves all 4 extremities w/o difficulty. Affect pleasant Flexi Seal with black stool.     Telemetry  NSR 80 personally checked.   Labs    CBC Recent Labs    08/25/19 0400  08/25/19 1945 08/26/19 0400  WBC 11.3*  --   --  12.3*  HGB 7.7*   < > 10.7* 9.6*  HCT 22.6*   < > 30.0* 28.2*  MCV 96.6  --   --  93.4  PLT 212  --   --  278   < > = values in this interval not displayed.   Basic  Metabolic Panel Recent Labs    08/23/19 1723  08/24/19 0849 08/25/19 0437 08/25/19 1723 08/26/19 0400  NA  --    < > 137 136 139  --   K 3.7  --  3.5 3.3* 3.5  --   CL  --   --  108 108  --   --   CO2  --   --  22 24  --   --   GLUCOSE  --   --  92 111*  --   --   BUN  --   --  45* 33*  --   --   CREATININE  --   --  1.34* 1.17*  --   --   CALCIUM  --   --  7.4* 7.6*  --   --   MG 1.7  --   --   --   --  1.5*   < > = values in this interval not displayed.   Liver Function Tests Recent Labs    08/24/19 0849  AST 54*  ALT 36  ALKPHOS 61  BILITOT 5.8*  PROT 4.8*  ALBUMIN 1.2*   No results for input(s): LIPASE, AMYLASE in the last 72 hours. Cardiac Enzymes No results for input(s):  CKTOTAL, CKMB, CKMBINDEX, TROPONINI in the last 72 hours.  BNP: BNP (last 3 results) No results for input(s): BNP in the last 8760 hours.  ProBNP (last 3 results) No results for input(s): PROBNP in the last 8760 hours.   D-Dimer No results for input(s): DDIMER in the last 72 hours. Hemoglobin A1C No results for input(s): HGBA1C in the last 72 hours. Fasting Lipid Panel No results for input(s): CHOL, HDL, LDLCALC, TRIG, CHOLHDL, LDLDIRECT in the last 72 hours. Thyroid Function Tests No results for input(s): TSH, T4TOTAL, T3FREE, THYROIDAB in the last 72 hours.  Invalid input(s): FREET3  Other results:   Imaging    Dg Chest Port 1 View  Result Date: 08/25/2019 CLINICAL DATA:  Intubation. EXAM: PORTABLE CHEST 1 VIEW COMPARISON:  08/23/2019 FINDINGS: Patient is rotated to the right. Endotracheal tube has tip 2.9 cm above the carina. Enteric tube courses into the stomach and off the film as tip is not visualized. Right IJ central venous catheter unchanged with tip over the SVC. Persistent moderate size right pleural effusion and small left pleural effusion likely with associated bibasilar atelectasis. Infection in the lung bases is possible. Stable cardiomegaly. Remainder of the exam is  unchanged. IMPRESSION: Stable moderate right effusion and small left effusion likely with associated bibasilar atelectasis. Infection in the lung bases is possible. Stable cardiomegaly. Tubes and lines as described. Electronically Signed   By: Marin Olp M.D.   On: 08/25/2019 16:28     Medications:     Scheduled Medications: . aspirin  81 mg Per Tube Pre-Cath  . chlorhexidine  15 mL Mouth/Throat BID  . chlorhexidine gluconate (MEDLINE KIT)  15 mL Mouth Rinse BID  . Chlorhexidine Gluconate Cloth  6 each Topical Daily  . digoxin  0.125 mg Per Tube Daily  . folic acid  1 mg Per Tube Daily  . gabapentin  200 mg Per Tube Q12H  . lactulose  30 g Per Tube Q6H  . mouth rinse  15 mL Mouth Rinse 10 times per day  . multivitamin with minerals  1 tablet Per Tube Daily  . nicotine  21 mg Transdermal Daily  . pantoprazole  40 mg Intravenous Q12H  . potassium chloride  40 mEq Oral Once  . rifaximin  550 mg Per Tube BID  . sodium chloride flush  10 mL Intravenous Q12H  . sodium chloride flush  10 mL Intravenous Q12H  . spironolactone  12.5 mg Per Tube Daily  . thiamine  100 mg Per Tube Daily    Infusions: . sodium chloride Stopped (08/25/19 2202)  . sodium chloride    . sodium chloride 20 mL/hr at 08/26/19 0600  . DOBUTamine 2.5 mcg/kg/min (08/25/19 1700)  . heparin 1,250 Units/hr (08/26/19 0600)  . magnesium sulfate bolus IVPB      PRN Medications: sodium chloride, acetaminophen, albuterol, dextromethorphan-guaiFENesin, fentaNYL (SUBLIMAZE) injection, hydrALAZINE, labetalol, ondansetron (ZOFRAN) IV, ondansetron **OR** [DISCONTINUED] ondansetron (ZOFRAN) IV, oxyCODONE, sodium chloride flush, sodium chloride flush   Assessment/Plan   1. Acute on chronic systolic CHF/cardiogenic shock: Echo with EF 15-20%, mild LV dilation, LV thrombus, mild-moderate MR, severely decreased RV systolic function. Cause of cardiomyopathy is uncertain, but she was a heavy smoker and ECG is suggestive of  possible old ASMI. Ischemic cardiomyopathy is possible, alternatively could be due to ETOH abuse (>6 pack/day per husband) or stress cardiomyopathy with critical illness.  Lactate was elevated at admission.  Suspect cardiogenic shock with low BP, cannot rule out component of septic shock. Currently on  dobutamine 2.5 with stable co-ox (48% pre-dobutamine) and stable MAP.   - CVP 4-5. No diuretics.   - CO-OX pending.  - Continue spironolactone 12.5 mg daily .  - Continue dobutamine at 2.5 and digoxin - No invasive evaluation at this time with ongoing melena and high INR.  - Not candidate for advanced therapies - After discussions with her husband CCM and GI recommending Hospice Care. She is now DNR.  2. 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 Markedly elevated bilirubin initially with jaundice.  However liver imaging with [place] and CT has NOT shown cirrhosis or portal/hepatic vein thrombosis. Question has arisen whether this might possibly be due to primarily to RV failure.  Time course seems quite acute for RV failure.  - holding stain and other hepato-toxic agents  3. Encephalopathy: ?Cause.  NH3 has not been markedly high but certainly could be hepatic encephalopathy.  Shock likely played a role.  Less confusion this morning.  - Continue lactulose/rifaxamin per GI.  4. AKI: Creatinine improved to 1.17 with inotropic support.  Suspect cardiorenal syndrome, also had contrast with LE angiograms so possible component of contrast nephropathy.  - BMET pending.  5. LV thrombus: Possible cause of embolic event to popliteal arteries as well as subacute CVA.  Heparin gtt held with active GI bleeding initially, then restarted 9/24. INR up to 4.0 with poor nutrition. Still with ongoing melena but has slowed some - With embolic events recently, will continue heparin gtt at lowest therapeutic range.  - Will give gentle vitamin K to bring down INR.  6. PAD: Bilateral popliteal artery occlusions,  suspect cardioembolic from LV thrombus.  Now s/p mechanical thrombectomy by vascular. Right foot still looks ischemic but dopplerable pulses.  7. 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  Treatment will be anticoagulation.  8. Right popliteal vein DVT: Anticoagulated.  9. GI bleeding: Upper GI bleeding, ?PUD versus varices given liver disease.  Still with melena in rectal bag today.  Ongoing need for transfusions. Not on asa with bleeding. Hgb 7.7 today.  - Off octreotide, continue Protonix.   - Given 1 unit PRBCs 9/28 with 1 mg Vitamin K (elevated INR with nutritional deficiency and liver failure).  - Plan for EGD today.  10. ETOH abuse: Per husband, at least a 6 pack daily.  11. ID: Concern for aspiration PNA. - Completed course of ceftriaxone.  12. DNR. Husband not ready for Hospice.   Length of Stay: [place], NP  08/26/2019, 7:26 AM  Advanced Heart Failure Team Pager [phone] (M-F; 7a - 4p)  Please contact [facility] for night-coverage after hours (4p -7a ) and weekends on [email or web address]  Patient seen with NP, agree with the above note.   She remains on dobutamine 2.5.  Hgb up to 9.6 this morning after 1 unit PRBCs yesterday.  Melena slowing.  She is more awake/alert today, understands what is going on.  CVP 4-5.   General: NAD Neck: No JVD, no thyromegaly or thyroid nodule.  Lungs: Clear to auscultation bilaterally with normal respiratory effort. CV: Nondisplaced PMI.  Heart regular S1/S2, no S3/S4, no murmur.  1+ edema to knees.   Abdomen: Soft, nontender, no hepatosplenomegaly, no distention.  Skin: Jaundice Neurologic: Alert and oriented x 3.  Psych: Normal affect. Extremities: No clubbing or cyanosis. Right foot wrapped.  HEENT: Icteric.   With ongoing GI bleeding, plan for EGD today.  Will be intubated for EGD.  Hgb 9.6 today, no  transfusion.   Confusion much improved, though to be hepatic encephalopathy.  She remains on rifaximin and  lactulose.   LV thrombus with embolic events => remains on heparin gtt.   CVP 4-5, no diuretic needed. Peripheral edema likely low oncotic pressure.   Awaiting BMET today.  Continue digoxin and spironolactone. Continue dobutamine gtt for now.  When GI issues sorted out, will aim for right/left cath.   CRITICAL CARE Performed by: Loralie Champagne  Total critical care time: 35 minutes  Critical care time was exclusive of separately billable procedures and treating other patients.  Critical care was necessary to treat or prevent imminent or life-threatening deterioration.  Critical care was time spent personally by me on the following activities: development of treatment plan with patient and/or surrogate as well as nursing, discussions with consultants, evaluation of patient's response to treatment, examination of patient, obtaining history from patient or surrogate, ordering and performing treatments and interventions, ordering and review of laboratory studies, ordering and review of radiographic studies, pulse oximetry and re-evaluation of patient's condition.  Loralie Champagne 08/26/2019 8:11 AM

## 2019-08-26 NOTE — Interval H&P Note (Signed)
History and Physical Interval Note:  08/26/2019 9:23 AM  Briana Andrews  has presented today for surgery, with the diagnosis of bleeding.  The various methods of treatment have been discussed with the patient and family. After consideration of risks, benefits and other options for treatment, the patient has consented to  Procedure(s): ESOPHAGOGASTRODUODENOSCOPY (EGD) (N/A) as a surgical intervention.  The patient's history has been reviewed, patient examined, no change in status, stable for surgery.  I have reviewed the patient's chart and labs.  Questions were answered to the patient's satisfaction.     [name]

## 2019-08-26 NOTE — Progress Notes (Signed)
`  NAME:  Briana Andrews, MRN:  [id number], DOB:  06-11-1966, LOS: 10 ADMISSION DATE:  08/15/2019, CONSULTATION DATE: 9/23 REFERRING MD:  Marigene Ehlers, CHIEF COMPLAINT:  Post-op shock   Brief History   30 yof admitted 9/19 w/ AMS and lethargy. 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 She was seen by multiple sub specialists including: Neuro, cardiology, vascular surgery and GI. She underwent subsequent mechanical thrombectomy of the lower extremities (the right on 9/22 and the left on 9/23). PCCM asked to see on 9/23 as she was hypotensive    Past Medical History  ETOH and tobacco abuse.   Significant Hospital Events   9/19 admitted. MRI  subacute right frontal ACA territory infarct and small right caudate and cerebellar infarcts.  As part of the work up an echo was obtained. 9/20 ECHO showed: new CM EF 15-20%  W/ large fixed LV thrombus and was started on [facility] and cardiology was consulted. She was also seen by vascular surg w/ concern for ischemic LEs. US showed bilateral total popliteal artery occlusion w/ DVT on right.  GI was consulted for liver failure, heme + stool and dropping hgb.  9/22 for thrombectomy of the RLE   9/23 for left LE thrombectomy of the left post tib and popliteal artery. She returned to the med-surg floor hypotensive and more confused which was why PCCM was consulted. Also having melanotic stool w/ hgb drop so GI starting octreotide and ppi gtt. Transferred to ICU, DNR order placed. Palliative consulted-- no escalation of care  9/24> Improved mentation. Heparin resumed. Continues on dobutamine 9/25> continues on dobutamine 9/26: Remains stable renal function a little improved.  Mental status a little improved.  Protonix and octreotide stopped antibiotics discontinued 9/28: Hemoglobin drifting 1 g from  8.7 down to 7.7, transfused 1 unit.  Remains elevated getting oral vitamin K ready to move out of the intensive care heart failure team titrating dobutamine to off over the next 24 to 48 hours 9/29: discussion with husband regarding where to go, patient intubated in anticipation for EGD in AM Consults:  Neuro 9/19 Cardiology 9/19 Vascular surgey 9/19 GI  PCCM 9/23 Palliative Care Medicine 9/23   Procedures:  9/22 Mechanical thrombectomy of the right superficial femoral, popliteal, and posterior tibial artery using the penumbra CAT 6 device 9/23 Mechanical thrombectomy of the left popliteal and posterior tibial artery using the Penumbra CAT 6 device Significant Diagnostic Tests:  Echo 9/19: Left Ventricle: Left ventricular ejection fraction, by visual estimation, is 20%. The left ventricle has severely decreased function. There is a large, fixed, apical left ventricular thrombus. Global RV systolic function is has severely reduced systolic function Mri brain 9/19: 1. Gyriform post ischemic appearing enhancement in the right ACA territory, corresponding to most confluent diffusion abnormality earlier today. 2. Faint linear enhancement in the left frontal lobe white matter appears to reflect an area of late subacute ischemia. CT abd nml pancreas, no biliary dilation, no gallstones.   9/24 Vascular US> RLe moderate arterial disease. LLE moderate arterial disease.   Micro Data:  9/19 SARS cov2> neg 9/19 RVP> neg 9/19 BCx> NGTD  Antimicrobials:  levaquin 9/21>>> 9/23 Ceftriaxone 9/23>> 9/26  Interim history/subjective:  Self extubated this morning.  GI would like her re-intubated for scope.  Her mentation is much improved, AOx3.  Objective  Blood pressure (!) 112/57, pulse 85, temperature 99.3 F (37.4 C), temperature source Axillary, resp. rate 17, height 5\' 5"  (1.651 m), weight 80.2 kg, SpO2 100 %. CVP:  [5 mmHg-9 mmHg] 5 mmHg  Vent Mode: PRVC FiO2 (%):  [60 %-100 %] 60 %  Set Rate:  [16 bmp] 16 bmp Vt Set:  [450 mL] 450 mL Pressure Support:  [5 cmH20] 5 cmH20 Plateau Pressure:  [18 cmH20-22 cmH20] 18 cmH20   Intake/Output Summary (Last 24 hours) at 08/26/2019 0759 Last data filed at 08/26/2019 0600 Gross per 24 hour  Intake 1737.02 ml  Output 3375 ml  Net -1637.98 ml   Filed Weights   08/24/19 0443 08/25/19 0454 08/26/19 0500  Weight: 81.9 kg 85.1 kg 80.2 kg    Examination: GEN: chronically ill woman in NAD HEENT: MM dry, some dried blood around oropharynx, trachea midline CV: RRR, ext warm PULM: CTAB, no accessory muscle use GI: Soft, +BS, dark stool in rectal bag EXT: LLE great pulses, dopplerable on R, ischemic distal changes about the same on R NEURO: Moves all 4 ext to command, still some asterixis PSYCH: more oriented today but is slow to answer questions SKIN: jaundiced   Resolved Hospital Problem list     Assessment & Plan:  # Biventricular failure some combination ischemic event vs. stress-induced (from shock state, acidemia, ABLA, ?aspiration-induced sepsis) # LV clot on AC # Possible  # GIB- melena with suspicion for upper GI source, intermittent transfusion requirements # Alcoholism, hyperbilirubinemia, clinically she has cirrhosis although imaging features not c/w this; LFT pattern not c/w alcoholic hepatitis nor ischemic nor congestive # Encephalopathy- combination from critical illness, hepatic, and CVA, still has asterixis, improving overall # AKI improving related to initial shock, blood loss # CVAs likely from LV clot # BL femoral artery occlusion s/p BL thrombectomy, RLE does not look great but dopplerable pulses, vascular surgery aware, poor candidate for further procedures, monitor clinically # Severe protein calorie malnutrition due to alcoholism # R effusion but no resp distress, suspicion this is hepatic hydrothorax vs. Distrubutive from poor oncotic pressure. No indication to drain.  -Re-intubate for EGD today  -Extubate after EGD and start diet if GI okay with it -Continue lactulose/rifaxamin, thiamine/folate   -Continue heparin gtt -Continue neurovascular checks on LE -Up to chair, PT/OT consults -Eventual plan for [facility] to evaluate new biventricular failure once we assure GIB under control -Completing 7 days ceftriaxone for aspiration PNA (less likely) and SBP ppx -If everything smooth with EGD, will ask TRH to take over starting tomorrow  35 mins CC time not including any separately billable procedures.  08/28/19

## 2019-08-26 NOTE — Progress Notes (Signed)
Notified by RN that patient self extubated at [place]. Pt currently on 6L nasal cannula tolerating it well. RT will continue to monitor.

## 2019-08-26 NOTE — Progress Notes (Signed)
Nutrition Follow-up  DOCUMENTATION CODES:   Not applicable  INTERVENTION:    Boost Breeze po TID, each supplement provides 250 kcal and 9 grams of protein  MVI with minerals   NUTRITION DIAGNOSIS:   Moderate Malnutrition related to social / environmental circumstances(EtOH abuse) as evidenced by mild fat depletion, moderate fat depletion, mild muscle depletion, moderate muscle depletion.  Ongoing  GOAL:   Patient will meet greater than or equal to 90% of their needs  Not meeting at this time  MONITOR:   Diet advancement, Labs, PO intake, Weight trends, I & O's, Skin  REASON FOR ASSESSMENT:   Low Braden    ASSESSMENT:   53 year old female who presented to the ED on 9/18 with generalized weakness, AMS, and SOB. PMH of tobacco abuse, EtOH abuse. Pt admitted with acute liver failure and multifocal pneumonia. Infarct seen on MRI. CT abdomen showing cardiomegaly, moderate right-sided pleural effusion, and aortic atherosclerosis. Pt now with newly diagnosed biventricular CHF.   9/22 - s/p aortogram, right popliteal artery occlusion treated with thrombectomy 9/23 - left popliteal artery occlusion treated with thrombectomy, transferred to ICU due to hypotension 9/28- intubated for EGD 9/29- EGD reveals bleeding duodenal bulb ulcer, stopped with epi, extubated   RD working remotely.  Unable to reach pt by phone at this time. Mentation improving. Meal completions documented as 0-25% prior to intubation. Awaiting diet advancement per GI. RD to provide supplements to maximize kcal and protein.   Admission weight: 81.6 kg Current weight: 80.2 kg   I/O: +5,664 ml since admit UOP: 2,625 ml x 24 hrs    Drips: NS @ 20 ml/hr, dobutrex, Mg sulfate Medications: folic acid, lactulose, MVI with minerals, aldactone, thiamine Labs: K 3.3 (L) Mg 1.5 (L)    Diet Order:   Diet Order            Diet clear liquid Room service appropriate? Yes; Fluid consistency: Thin  Diet effective  now              EDUCATION NEEDS:   Not appropriate for education at this time  Skin:  Skin Assessment: Skin Integrity Issues: Skin Integrity Issues:: Stage I, Other (Comment) Stage I: left heel, right heel Other: blistered area to right foot, right leg and non-pressure wound to right pretibial  Last BM:  9/29- 400 ml rectal tube  Height:   Ht Readings from Last 1 Encounters:  08/15/19 5\' 5"  (1.651 m)    Weight:   Wt Readings from Last 1 Encounters:  08/26/19 80.2 kg    Ideal Body Weight:  56.8 kg  BMI:  Body mass index is 29.42 kg/m.  Estimated Nutritional Needs:   Kcal:  1850-2050 kcal  Protein:  90-105 grams  Fluid:  >/= 1.8 L/day   Mariana Single RD, LDN Clinical Nutrition Pager # - [phone]

## 2019-08-26 NOTE — Progress Notes (Signed)
PT Cancellation Note  Patient Details Name: Briana Andrews MRN: [id number] DOB: 11-Aug-1966   Cancelled Treatment:    Reason Eval/Treat Not Completed: Patient at procedure or test/unavailable(EGD in progress and to be extubated shortly after test)   Denice Paradise 08/26/2019, 10:02 AM Kellyn Mansfield,PT Acute Rehabilitation Services Pager:  [phone]  Office:  [phone]

## 2019-08-26 NOTE — Progress Notes (Signed)
SLP Cancellation Note  Patient Details Name: Briana Andrews MRN: [id number] DOB: 1966/03/19   Cancelled treatment:       Reason Eval/Treat Not Completed: Medical issues which prohibited therapy;Patient not medically ready(Pt is currently intubated but RN indicated that she will be extubated today. SLP will follow up on subsequent date.)  Coumba Kellison I. Hardin Negus, [place], [place] Office number [phone] Pager [place] 08/26/2019, 1:57 PM

## 2019-08-26 NOTE — Progress Notes (Signed)
ANTICOAGULATION CONSULT NOTE - Follow Up Consult  Pharmacy Consult for Heparin Indication: DVT, LV thrombus, and acute CVA  Allergies  Allergen Reactions  . Penicillins Other (See Comments)    Unsure of reaction Did it involve swelling of the face/tongue/throat, SOB, or low BP? Unknown Did it involve sudden or severe rash/hives, skin peeling, or any reaction on the inside of your mouth or nose? Unknown Did you need to seek medical attention at a hospital or doctor's office? Unknown When did it last happen?[place] If all above answers are "NO", may proceed with cephalosporin use.    Patient Measurements: Height: 5' 5"  (165.1 cm) Weight: 176 lb 12.9 oz (80.2 kg) IBW/kg (Calculated) : 57 Heparin Dosing Weight: 75 kg  Vital Signs: Temp: 98.8 F (37.1 C) (09/29 1200) Temp Source: Oral (09/29 1200) BP: 101/60 (09/29 1200) Pulse Rate: 93 (09/29 0900)  Labs: Recent Labs    08/24/19 0313 08/24/19 0849 08/24/19 1433  08/25/19 0400 08/25/19 0411 08/25/19 0437  08/25/19 1945 08/26/19 0400 08/26/19 0730 08/26/19 1212  HGB 8.6*  --  8.3*   < > 7.7*  --   --    < > 10.7* 9.6*  --  9.3*  HCT 25.0*  --  24.1*   < > 22.6*  --   --    < > 30.0* 28.2*  --  28.1*  PLT 195  --   --   --  212  --   --   --   --  278  --   --   LABPROT  --  37.7*  --   --   --   --  38.5*  --   --   --   --   --   INR  --  3.9*  --   --   --   --  4.0*  --   --   --   --   --   HEPARINUNFRC 0.28*  --  0.30  --   --  0.29*  --   --   --   --   --  0.41  CREATININE  --  1.34*  --   --   --   --  1.17*  --   --   --  1.09*  --    < > = values in this interval not displayed.    Estimated Creatinine Clearance: 61.8 mL/min (A) (by C-G formula based on SCr of 1.09 mg/dL (H)).   Medications:  Scheduled:  . chlorhexidine  15 mL Mouth/Throat BID  . chlorhexidine gluconate (MEDLINE KIT)  15 mL Mouth Rinse BID  . Chlorhexidine Gluconate Cloth  6 each Topical Daily  . digoxin  0.125 mg Per Tube Daily   . folic acid  1 mg Per Tube Daily  . gabapentin  200 mg Per Tube Q12H  . lactulose  30 g Per Tube Q6H  . mouth rinse  15 mL Mouth Rinse 10 times per day  . multivitamin with minerals  1 tablet Per Tube Daily  . nicotine  21 mg Transdermal Daily  . pantoprazole  40 mg Intravenous Q12H  . potassium chloride  40 mEq Oral Once  . rifaximin  550 mg Per Tube BID  . sodium chloride flush  10 mL Intravenous Q12H  . sodium chloride flush  10 mL Intravenous Q12H  . spironolactone  12.5 mg Per Tube Daily  . thiamine  100 mg Per Tube Daily   Infusions:  .  sodium chloride Stopped (08/25/19 2202)  . DOBUTamine 2.5 mcg/kg/min (08/26/19 0800)  . heparin 1,250 Units/hr (08/26/19 0800)  . magnesium sulfate    . norepinephrine (LEVOPHED) Adult infusion Stopped (08/26/19 0945)  . propofol (DIPRIVAN) infusion Stopped (08/26/19 0930)    Assessment: [name] on heparin for LV thrombus and bilateral lower extremity ischemia w/ right popliteal vein DVT now s/p angioplasty and R/L popliteal thrombectomy of the tibial artery. Heparin therapy complicated by acute CVA and recent GI bleed and need for lower goal. Plans noted for coumadin when no further procedures   She is s/p mech thrombectomy of L popliteal 9/23. Heparin stopped 09/23 @ 1750 due to a GI bleed. Pt completed course of octreotide. Endoscopy this morning stopped pyloric channel ulcer and duodenal ulcer. Pt placed on IV PPI for treatment of ulcers. Heparin to continue due to LV thrombus per Dr. Aundra Dubin. H/H now stable at 9.3 after1 unit of PRBC on 9/28.  Heparin level is within goal at 0.41 on heparin rate of 1,250 units/hr. No new sources of bleeding noted or issues with infusion per RN.  Goal of Therapy:  Heparin level 0.3-0.5 units/ml Monitor platelets by anticoagulation protocol: Yes   Plan:  - Continue heparin 1250 units/hr - Will be conservative with heparin dosing due to bleeding - Daily heparin level and CBC - Monitor for signs/symptoms  of bleeding   Sherren Kerns, PharmD PGY1 [facility] Resident [phone] Please check AMION for all [facility] numbers 08/26/2019

## 2019-08-27 DIAGNOSIS — I5043 Acute on chronic combined systolic (congestive) and diastolic (congestive) heart failure: Secondary | ICD-10-CM

## 2019-08-27 LAB — BASIC METABOLIC PANEL
Anion gap: 5 (ref 5–15)
BUN: 22 mg/dL — ABNORMAL HIGH (ref 6–20)
CO2: 22 mmol/L (ref 22–32)
Calcium: 7 mg/dL — ABNORMAL LOW (ref 8.9–10.3)
Chloride: 108 mmol/L (ref 98–111)
Creatinine, Ser: 0.96 mg/dL (ref 0.44–1.00)
GFR calc Af Amer: >60 mL/min (ref >60)
GFR calc non Af Amer: >60 mL/min (ref >60)
Glucose, Bld: 112 mg/dL — ABNORMAL HIGH (ref 70–99)
Potassium: 3.3 mmol/L — ABNORMAL LOW (ref 3.5–5.1)
Sodium: 135 mmol/L (ref 135–145)

## 2019-08-27 LAB — PROTIME-INR
INR: 1.9 — ABNORMAL HIGH (ref 0.8–1.2)
Prothrombin Time: 21.3 seconds — ABNORMAL HIGH (ref 11.4–15.2)

## 2019-08-27 LAB — CBC
HCT: 22.8 % — ABNORMAL LOW (ref 36.0–46.0)
HCT: 26 % — ABNORMAL LOW (ref 36.0–46.0)
Hemoglobin: 7.5 g/dL — ABNORMAL LOW (ref 12.0–15.0)
Hemoglobin: 8.5 g/dL — ABNORMAL LOW (ref 12.0–15.0)
MCH: 32.1 pg (ref 26.0–34.0)
MCH: 31.7 pg (ref 26.0–34.0)
MCHC: 32.9 g/dL (ref 30.0–36.0)
MCHC: 32.7 g/dL (ref 30.0–36.0)
MCV: 97.4 fL (ref 80.0–100.0)
MCV: 97 fL (ref 80.0–100.0)
Platelets: 284 10*3/uL (ref 150–400)
Platelets: 261 10*3/uL (ref 150–400)
RBC: 2.34 MIL/uL — ABNORMAL LOW (ref 3.87–5.11)
RBC: 2.68 MIL/uL — ABNORMAL LOW (ref 3.87–5.11)
RDW: 21 % — ABNORMAL HIGH (ref 11.5–15.5)
RDW: 19.7 % — ABNORMAL HIGH (ref 11.5–15.5)
WBC: 11.5 10*3/uL — ABNORMAL HIGH (ref 4.0–10.5)
WBC: 12.1 10*3/uL — ABNORMAL HIGH (ref 4.0–10.5)
nRBC: 0 % (ref 0.0–0.2)
nRBC: 0 % (ref 0.0–0.2)

## 2019-08-27 LAB — HEMOGLOBIN AND HEMATOCRIT, BLOOD
HCT: 23.7 % — ABNORMAL LOW (ref 36.0–46.0)
Hemoglobin: 8.2 g/dL — ABNORMAL LOW (ref 12.0–15.0)

## 2019-08-27 LAB — MAGNESIUM: Magnesium: 1.9 mg/dL (ref 1.7–2.4)

## 2019-08-27 LAB — COOXEMETRY PANEL
Carboxyhemoglobin: 2.1 % — ABNORMAL HIGH (ref 0.5–1.5)
Methemoglobin: 1.3 % (ref 0.0–1.5)
O2 Saturation: 76 %
Total hemoglobin: 7.7 g/dL — ABNORMAL LOW (ref 12.0–16.0)

## 2019-08-27 LAB — PREPARE RBC (CROSSMATCH)

## 2019-08-27 LAB — HEPARIN LEVEL (UNFRACTIONATED)
Heparin Unfractionated: 0.21 IU/mL — ABNORMAL LOW (ref 0.30–0.70)
Heparin Unfractionated: 0.41 IU/mL (ref 0.30–0.70)

## 2019-08-27 MED ORDER — MAGNESIUM SULFATE 2 GM/50ML IV SOLN
2.0000 g | Freq: Once | INTRAVENOUS | Status: AC
  Administered 2019-08-27: 2 g via INTRAVENOUS
  Filled 2019-08-27: qty 50

## 2019-08-27 MED ORDER — SODIUM CHLORIDE 0.9% IV SOLUTION: Freq: Once | INTRAVENOUS | Status: AC

## 2019-08-27 MED ORDER — POTASSIUM CHLORIDE 10 MEQ/50ML IV SOLN
10.0000 meq | INTRAVENOUS | Status: AC
  Administered 2019-08-27 (×2): 10 meq via INTRAVENOUS
  Filled 2019-08-27 (×2): qty 50

## 2019-08-27 NOTE — Progress Notes (Signed)
Occupational Therapy Treatment Patient Details Name: Briana Andrews MRN: [id number] DOB: 03-Jan-1966 Today's Date: 08/27/2019    History of present illness 53 year old female with history of smoking, alcohol use, she drinks more than 6 packs of beer every day since last 20 years, no chronic medical issues brought to the emergency room with confusion more than usual and falls.  Since last few months there have been noticing some changes on her including weakness on her legs and poor appetite as much as she stopped smoking and decreased her drinking.  2 weeks ago, she was taken to [facility] with a fall, skeletal survey was negative except L1 fracture with 25% height loss and discharged home on muscle relaxants.  Patient has been intermittently confused since then. In the emergency room, she was found with subacute right ACA stroke, jaundice with bilirubin of 12, bilateral multifocal pneumonia and metabolic encephalopathy.   OT comments  Pt alert and immediately willing to work with therapies. Requiring moderate assistance to achieve sitting EOB, sat x 15 minutes with supervision. Utilized stedy, standing from elevated bed with min assist, to transfer to chair. Pt completed grooming once seated in chair. R foot with foul odor and pt reporting pain. Pt continues to demonstrate slow processing, but following commands consistently with increased time. Continue to recommend CIR for further rehab.   Follow Up Recommendations  CIR;Supervision/Assistance - 24 hour    Equipment Recommendations  Other (comment)(defer to next venue)    Recommendations for Other Services      Precautions / Restrictions Precautions Precautions: Fall Precaution Comments: flexiseal, foley, wound on R foot       Mobility Bed Mobility Overal bed mobility: Needs Assistance Bed Mobility: Supine to Sit     Supine to sit: Mod assist;HOB elevated     General bed mobility comments: increased time, verbal and  tactile cues to sequence, min assist for LEs over EOB, mod assist pulling up on therapist's hand to raise trunk  Transfers Overall transfer level: Needs assistance   Transfers: Sit to/from Stand Sit to Stand: Min assist;From elevated surface         General transfer comment: min assist to rise and steady    Balance Overall balance assessment: Needs assistance   Sitting balance-Leahy Scale: Fair Sitting balance - Comments: no LOB with attempt to don sock   Standing balance support: Bilateral upper extremity supported Standing balance-Leahy Scale: Poor Standing balance comment: B UE support of stedy                           ADL either performed or assessed with clinical judgement   ADL Overall ADL's : Needs assistance/impaired Eating/Feeding: Set up;Sitting   Grooming: Wash/dry face;Sitting;Set up               Lower Body Dressing: Moderate assistance;Sitting/lateral leans       Toileting- Clothing Manipulation and Hygiene: Total assistance;Sit to/from stand               Vision       Perception     Praxis      Cognition Arousal/Alertness: Awake/alert Behavior During Therapy: Flat affect Overall Cognitive Status: Impaired/Different from baseline Area of Impairment: Following commands;Problem solving;Memory;Orientation                 Orientation Level: Disoriented to;Situation;Time Current Attention Level: Selective Memory: Decreased short-term memory Following Commands: Follows one step commands with increased time  Problem Solving: Slow processing;Decreased initiation;Difficulty sequencing;Requires verbal cues;Requires tactile cues          Exercises     Shoulder Instructions       General Comments      Pertinent Vitals/ Pain       Pain Assessment: Faces Faces Pain Scale: Hurts even more Pain Location: R foot Pain Descriptors / Indicators: Aching;Sore Pain Intervention(s): Monitored during  session;Repositioned  Home Living                                          Prior Functioning/Environment              Frequency  Min 2X/week        Progress Toward Goals  OT Goals(current goals can now be found in the care plan section)  Progress towards OT goals: Progressing toward goals  Acute Rehab OT Goals Patient Stated Goal: agreeable to OOB with therapy OT Goal Formulation: With patient Time For Goal Achievement: 09/02/19 Potential to Achieve Goals: Good  Plan Discharge plan remains appropriate    Co-evaluation    PT/OT/SLP Co-Evaluation/Treatment: Yes Reason for Co-Treatment: For patient/therapist safety;Complexity of the patient's impairments (multi-system involvement)   OT goals addressed during session: ADL's and self-care      AM-PAC OT "6 Clicks" Daily Activity     Outcome Measure   Help from another person eating meals?: None Help from another person taking care of personal grooming?: A Little Help from another person toileting, which includes using toliet, bedpan, or urinal?: A Lot Help from another person bathing (including washing, rinsing, drying)?: A Lot Help from another person to put on and taking off regular upper body clothing?: A Little Help from another person to put on and taking off regular lower body clothing?: A Lot 6 Click Score: 16    End of Session Equipment Utilized During Treatment: Oxygen  OT Visit Diagnosis: Unsteadiness on feet (R26.81);Muscle weakness (generalized) (M62.81);Other symptoms and signs involving cognitive function;Pain   Activity Tolerance Patient tolerated treatment well   Patient Left in chair;with call bell/phone within reach;with chair alarm set   Nurse Communication Mobility status;Need for lift equipment        Time: [phone] OT Time Calculation (min): 24 min  Charges: OT General Charges $OT Visit: 1 Visit OT Treatments $Self Care/Home Management : 8-22 mins  Martie Round, OTR/L Acute Rehabilitation Services Pager: [phone] Office: [phone]   Evern Bio 08/27/2019, 9:38 AM

## 2019-08-27 NOTE — Progress Notes (Signed)
Rt foot wound dressing changed per MD order without difficulty.  Will continue to monitor.

## 2019-08-27 NOTE — Progress Notes (Signed)
[place] Progress Note Patient Name: Briana Andrews DOB: [birth year] MRN: [id number]   Date of Service  08/27/2019  HPI/Events of Note  K+ 3.3, Mg 1.9, pt having short runs of V-Tach, Hgb 7.5, RN requesting transition to PO medications.  eICU Interventions  Elink electrolyte replacement protocol for K+ and Mg ++, transfuse 1 unit of PRBC, will switch meds to via tube as appropriate.     Intervention Category Intermediate Interventions: Arrhythmia - evaluation and management;Bleeding - evaluation and treatment with blood products  Irja Wheless U Kaity Pitstick 08/27/2019, 3:54 AM

## 2019-08-27 NOTE — Progress Notes (Signed)
`  NAME:  Priscille Shadduck, MRN:  [id number], DOB:  06-14-1966, LOS: 11 ADMISSION DATE:  08/15/2019, CONSULTATION DATE: 9/23 REFERRING MD:  Jearld Pies, CHIEF COMPLAINT:  Post-op shock   Brief History   53 yof admitted 9/19 w/ AMS and lethargy. 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 She was seen by multiple sub specialists including: Neuro, cardiology, vascular surgery and GI. She underwent subsequent mechanical thrombectomy of the lower extremities (the right on 9/22 and the left on 9/23). PCCM asked to see on 9/23 as she was hypotensive    Past Medical History  ETOH and tobacco abuse.   Significant Hospital Events   9/19 admitted. MRI  subacute right frontal ACA territory infarct and small right caudate and cerebellar infarcts.  As part of the work up an echo was obtained. 9/20 ECHO showed: new CM EF 15-20%  W/ large fixed LV thrombus and was started on [facility] and cardiology was consulted. She was also seen by vascular surg w/ concern for ischemic LEs. US showed bilateral total popliteal artery occlusion w/ DVT on right.  GI was consulted for liver failure, heme + stool and dropping hgb.  9/22 for thrombectomy of the RLE   9/23 for left LE thrombectomy of the left post tib and popliteal artery. She returned to the med-surg floor hypotensive and more confused which was why PCCM was consulted. Also having melanotic stool w/ hgb drop so GI starting octreotide and ppi gtt. Transferred to ICU, DNR order placed. Palliative consulted-- no escalation of care  9/24> Improved mentation. Heparin resumed. Continues on dobutamine 9/25> continues on dobutamine 9/26: Remains stable renal function a little improved.  Mental status a little improved.  Protonix and octreotide stopped antibiotics discontinued 9/28: Hemoglobin drifting 1 g from  8.7 down to 7.7, transfused 1 unit.  Remains elevated getting oral vitamin K ready to move out of the intensive care heart failure team titrating dobutamine to off over the next 24 to 48 hours 9/29: discussion with husband regarding where to go, patient intubated in anticipation for EGD in AM 9/30 Pt transferred to progressive care, stable after EGD  Consults:  Neuro 9/19 Cardiology 9/19 Vascular surgey 9/19 GI  PCCM 9/23 Palliative Care Medicine 9/23   Procedures:  9/22 Mechanical thrombectomy of the right superficial femoral, popliteal, and posterior tibial artery using the penumbra CAT 6 device 9/23 Mechanical thrombectomy of the left popliteal and posterior tibial artery using the Penumbra CAT 6 device 9/29 EGD>> LA grade D esophagitis mid and distal esophagus, One 21mm oozing cratered gastric ulcer was found in the prepyloric region of the stomach and at the pylorus.   Significant Diagnostic Tests:  Echo 9/19: Left Ventricle: Left ventricular ejection fraction, by visual estimation, is 20%. The left ventricle has severely decreased function. There is a large, fixed, apical left ventricular thrombus. Global RV systolic function is has severely reduced systolic function Mri brain 9/19: 1. Gyriform post ischemic appearing enhancement in the right ACA territory, corresponding to most confluent diffusion abnormality earlier today. 2. Faint linear enhancement in the left frontal lobe white matter appears to reflect an area of late subacute ischemia. CT abd nml pancreas, no biliary dilation, no gallstones.  9/24 Vascular US> RLe moderate arterial disease. LLE moderate arterial disease.   Micro Data:  9/19 SARS cov2> neg 9/19 RVP>  neg 9/19 BCx> NGTD  Antimicrobials:  levaquin 9/21>>> 9/23 Ceftriaxone 9/23>> 9/26  Interim history/subjective:  S/p EGD yesterday and 1 unit PRBC's overnight, pt is sitting up, awake and alert and complains of "but pain" from sitting in the chair too  long.    Objective   Blood pressure (!) 89/54, pulse 84, temperature 98.6 F (37 C), temperature source Oral, resp. rate (!) 24, height 5\' 5"  (1.651 m), weight 88.4 kg, SpO2 99 %. CVP:  [2 mmHg-8 mmHg] 2 mmHg  Vent Mode: CPAP;PSV FiO2 (%):  [40 %] 40 % PEEP:  [5 cmH20] 5 cmH20 Pressure Support:  [12 cmH20] 12 cmH20 Plateau Pressure:  [16 cmH20] 16 cmH20   Intake/Output Summary (Last 24 hours) at 08/27/2019 1017 Last data filed at 08/27/2019 0800 Gross per 24 hour  Intake 2070.78 ml  Output 200 ml  Net 1870.78 ml   Filed Weights   08/26/19 0500 08/26/19 2100 08/27/19 0500  Weight: 80.2 kg 88.4 kg 88.4 kg    General:  Chronically ill-appearing F in no acute distress  HEENT: MM pink/moist Neuro: awake and alert and moving all extremities CV: s1s2 rrr, no m/r/g PULM:  Diminished in the bases bilaterally GI: soft, bsx4 active  Extremities: warm/dry, no  edema  Skin: no rashes or lesions    Resolved Hospital Problem list     Assessment & Plan:  # Biventricular failure some combination ischemic event vs. stress-induced (from shock state, acidemia, ABLA, ?aspiration-induced sepsis) # LV clot on AC # Possible  # GIB- melena with suspicion for upper GI source, intermittent transfusion requirements # Alcoholism, hyperbilirubinemia, clinically she has cirrhosis although imaging features not c/w this; LFT pattern not c/w alcoholic hepatitis nor ischemic nor congestive # Encephalopathy- combination from critical illness, hepatic, and CVA, still has asterixis, improving overall # AKI improving related to initial shock, blood loss # CVAs likely from LV clot # BL femoral artery occlusion s/p BL thrombectomy, RLE does not look great but dopplerable pulses, vascular surgery aware, poor candidate for further procedures, monitor clinically # Severe protein calorie malnutrition due to alcoholism # R effusion but no resp distress, suspicion this is hepatic hydrothorax vs. Distrubutive from  poor oncotic pressure. No indication to drain.   P: -successfully extubated after EGD yesterday and respiratory status is stable on 4L [place] -oozing 50mm pre-pyloric ulcer was injected with Epi, Hgb 7.5 overnight and received 1 unit PRBC's, will repeat CBC today -tolerating po liquids well -continue lactulose, rifaximin, thiamine and folate -Continue mobilization, PT/OT as able -continue weaning Dobutamine per cardiology recommendations, eventual plan for LHC to evaluate for biventricular failure now that GIB is hopefully controlled -Continue heparin for LV thrombus, monitor for bleeding and transfuse for Hgb <8.0     Otilio Carpen Hania Cerone, PA-C [facility]  Pager# [phone], if no answer [phone]

## 2019-08-27 NOTE — Progress Notes (Signed)
Physical Therapy Treatment Patient Details Name: Briana Andrews MRN: [id number] DOB: 17-Aug-1966 Today's Date: 08/27/2019    History of Present Illness 53 year old female with history of smoking, alcohol use, she drinks more than 6 packs of beer every day since last 20 years, no chronic medical issues brought to the emergency room with confusion more than usual and falls.  Since last few months there have been noticing some changes on her including weakness on her legs and poor appetite as much as she stopped smoking and decreased her drinking.  2 weeks ago, she was taken to [facility] with a fall, skeletal survey was negative except L1 fracture with 25% height loss and discharged home on muscle relaxants.  Patient has been intermittently confused since then. In the emergency room, she was found with subacute right ACA stroke, jaundice with bilirubin of 12, bilateral multifocal pneumonia and metabolic encephalopathy.    PT Comments    Pt admitted with above diagnosis. Pt was able to stand to Stedy x 2 with min assist of 2 persons.  Pt did well overall with good postural stability in Stedy  Pt with right foot bleeding. Nursing made aware.  Pt in chair and was able to participate more fully today. Nurse also aware that pt with rectal bleeding as well .   Pt currently with functional limitations due to balance and endurance deficits. Pt will benefit from skilled PT to increase their independence and safety with mobility to allow discharge to the venue listed below.     Follow Up Recommendations  CIR     Equipment Recommendations  Other (comment)(TBD)    Recommendations for Other Services Rehab consult     Precautions / Restrictions Precautions Precautions: Fall Precaution Comments: flexiseal, foley, wound on R foot Restrictions Weight Bearing Restrictions: No    Mobility  Bed Mobility Overal bed mobility: Needs Assistance Bed Mobility: Supine to Sit     Supine to sit:  Mod assist;HOB elevated     General bed mobility comments: increased time, verbal and tactile cues to sequence, min assist for LEs over EOB, mod assist pulling up on therapist's hand to raise trunk  Transfers Overall transfer level: Needs assistance   Transfers: Sit to/from Stand Sit to Stand: Min assist;From elevated surface         General transfer comment: min assist to rise and steady. Had to clean pts bottom as it had bright red blood. Stood to [employer] x 2 for up to 30 seconds  Ambulation/Gait             General Gait Details: unable to progress due to R foot pain   Stairs             Wheelchair Mobility    Modified Rankin (Stroke Patients Only) Modified Rankin (Stroke Patients Only) Pre-Morbid Rankin Score: No symptoms Modified Rankin: Moderately severe disability     Balance Overall balance assessment: Needs assistance Sitting-balance support: Feet supported;Bilateral upper extremity supported Sitting balance-Leahy Scale: Fair Sitting balance - Comments: no LOB with attempt to don sock   Standing balance support: Bilateral upper extremity supported Standing balance-Leahy Scale: Poor Standing balance comment: B UE support of stedy                            Cognition Arousal/Alertness: Awake/alert Behavior During Therapy: Flat affect Overall Cognitive Status: Impaired/Different from baseline Area of Impairment: Following commands;Problem solving;Memory;Orientation  Orientation Level: Disoriented to;Situation;Time Current Attention Level: Selective Memory: Decreased short-term memory Following Commands: Follows one step commands with increased time     Problem Solving: Slow processing;Decreased initiation;Difficulty sequencing;Requires verbal cues;Requires tactile cues        Exercises General Exercises - Lower Extremity Long Arc Quad: AROM;Both;10 reps;Seated    General Comments General comments (skin  integrity, edema, etc.): Pt on 2LO2 with VSS, right foot bleeding and notified nursing.      Pertinent Vitals/Pain Pain Assessment: Faces Faces Pain Scale: Hurts even more Pain Location: R foot Pain Descriptors / Indicators: Aching;Sore Pain Intervention(s): Limited activity within patient's tolerance;Monitored during session;Repositioned    Home Living                      Prior Function            PT Goals (current goals can now be found in the care plan section) Acute Rehab PT Goals Patient Stated Goal: agreeable to OOB with therapy Progress towards PT goals: Progressing toward goals    Frequency    Min 4X/week      PT Plan Current plan remains appropriate    Co-evaluation   Reason for Co-Treatment: For patient/therapist safety;Complexity of the patient's impairments (multi-system involvement)   OT goals addressed during session: ADL's and self-care      AM-PAC PT "6 Clicks" Mobility   Outcome Measure  Help needed turning from your back to your side while in a flat bed without using bedrails?: A Lot Help needed moving from lying on your back to sitting on the side of a flat bed without using bedrails?: A Lot Help needed moving to and from a bed to a chair (including a wheelchair)?: Total Help needed standing up from a chair using your arms (e.g., wheelchair or bedside chair)?: A Lot Help needed to walk in hospital room?: Total Help needed climbing 3-5 steps with a railing? : Total 6 Click Score: 9    End of Session Equipment Utilized During Treatment: Gait belt;Oxygen Activity Tolerance: Patient limited by pain;Patient limited by fatigue Patient left: with call bell/phone within reach;in chair;with chair alarm set Nurse Communication: Mobility status;Need for lift equipment PT Visit Diagnosis: Other abnormalities of gait and mobility (R26.89);Pain;Muscle weakness (generalized) (M62.81) Pain - Right/Left: Right Pain - part of body: Ankle and joints  of foot     Time: 9798-9211 PT Time Calculation (min) (ACUTE ONLY): 24 min  Charges:  $Therapeutic Activity: 8-22 mins                     Arien Benincasa,PT Acute Rehabilitation Services Pager:  [phone]  Office:  [phone]     Berline Lopes 08/27/2019, 10:01 AM

## 2019-08-27 NOTE — Progress Notes (Signed)
[facility] Gastroenterology Progress Note  Briana Andrews 53 y.o. 1966-08-20   Subjective: Sitting in bedside chair. Reports black stools overnight. Denies abdominal pain, nausea, or vomiting.  Objective: Vital signs: Vitals:   08/27/19 0437 08/27/19 0757  BP: (!) 86/52 (!) 89/54  Pulse:  84  Resp: (!) 24 (!) 24  Temp: 98.6 F (37 C)   SpO2: 100% 99%    Physical Exam: Gen: lethargic, thin, no acute distress  HEENT: +icteric sclera CV: RRR Chest: CTA B Abd: soft, nontender, nondistended, +BS   Lab Results: Recent Labs    08/26/19 0400 08/26/19 0730 08/27/19 0200  NA  --  136 135  K  --  3.4* 3.3*  CL  --  107 108  CO2  --  22 22  GLUCOSE  --  106* 112*  BUN  --  27* 22*  CREATININE  --  1.09* 0.96  CALCIUM  --  7.3* 7.0*  MG 1.5*  --  1.9   No results for input(s): AST, ALT, ALKPHOS, BILITOT, PROT, ALBUMIN in the last 72 hours. Recent Labs    08/26/19 0400  08/26/19 1948 08/27/19 0200  WBC 12.3*  --   --  11.5*  HGB 9.6*   < > 8.0* 7.5*  HCT 28.2*   < > 23.7* 22.8*  MCV 93.4  --   --  97.4  PLT 278  --   --  284   < > = values in this interval not displayed.      Assessment/Plan: Decompensated cirrhosis with recent GI bleed due to duodenal and gastric ulcers - s/p therapeutic maneuvers to ulcers yesterday. Likely will have melena for the next several days from residual blood in upper tract seen on EGD. On IV heparin due to an LV thrombus. Recheck INR today. Continue Protonix drip. Supportive care.   Briana Andrews 08/27/2019, 9:55 AM  Questions please call [phone] ID: Briana Andrews, female   DOB: 11-15-1966, 53 y.o.   MRN: [id number]

## 2019-08-27 NOTE — Progress Notes (Signed)
Patient ID: Briana Andrews, female   DOB: June 23, 1966, 53 y.o.   MRN: [id number]     Advanced Heart Failure Rounding Note  PCP-Cardiologist: Ena Dawley, MD   Subjective:    Remains on dobutamine 2.5 mcg/kg/min.  Co-ox 76%.  CVP 5.   Awake and alert this morning.  Right foot not painful but complains of buttocks pain. Has not been out of bed.   Run of NSVT last night.   Still with some melena, hgb 7.5 today.  She remains on Protonix gtt.   Heparin gtt ongoing.   EGD (9/29): Bleeding gastric and duodenal ulcers treated in endoscopy suite with resolution of bleeding. No varices.    Objective:   Weight Range: 88.4 kg Body mass index is 32.43 kg/m.   Vital Signs:   Temp:  [97.7 F (36.5 C)-98.8 F (37.1 C)] 98.6 F (37 C) (09/30 0437) Pulse Rate:  [73-109] 84 (09/30 0757) Resp:  [12-25] 24 (09/30 0757) BP: (81-159)/(47-145) 89/54 (09/30 0757) SpO2:  [88 %-100 %] 99 % (09/30 0757) FiO2 (%):  [40 %] 40 % (09/29 1200) Weight:  [88.4 kg] 88.4 kg (09/30 0500) Last BM Date: 08/26/19(flexiseal)  Weight change: Filed Weights   08/26/19 0500 08/26/19 2100 08/27/19 0500  Weight: 80.2 kg 88.4 kg 88.4 kg    Intake/Output:   Intake/Output Summary (Last 24 hours) at 08/27/2019 0820 Last data filed at 08/27/2019 0550 Gross per 24 hour  Intake 1960.52 ml  Output 350 ml  Net 1610.52 ml      Physical Exam   CVP 5  General: NAD, jaundiced but improving.  Neck: No JVD, no thyromegaly or thyroid nodule.  Lungs: Clear to auscultation bilaterally with normal respiratory effort. CV: Nondisplaced PMI.  Heart regular S1/S2, no S3/S4, no murmur.  1+ edema 1/2 to knees bilaterally.   Abdomen: Soft, nontender, no hepatosplenomegaly, no distention.  Skin: Intact without lesions or rashes.  Neurologic: Alert and oriented x 3 this morning (improved).  Psych: Normal affect. Extremities: Ischemic changes right foot (wrapped).  HEENT: Resolving icterus   Telemetry  NSR 80 with 1 run  NSVT, personally checked.   Labs    CBC Recent Labs    08/26/19 0400  08/26/19 1948 08/27/19 0200  WBC 12.3*  --   --  11.5*  HGB 9.6*   < > 8.0* 7.5*  HCT 28.2*   < > 23.7* 22.8*  MCV 93.4  --   --  97.4  PLT 278  --   --  284   < > = values in this interval not displayed.   Basic Metabolic Panel Recent Labs    08/26/19 0400 08/26/19 0730 08/27/19 0200  NA  --  136 135  K  --  3.4* 3.3*  CL  --  107 108  CO2  --  22 22  GLUCOSE  --  106* 112*  BUN  --  27* 22*  CREATININE  --  1.09* 0.96  CALCIUM  --  7.3* 7.0*  MG 1.5*  --  1.9   Liver Function Tests Recent Labs    08/24/19 0849  AST 54*  ALT 36  ALKPHOS 61  BILITOT 5.8*  PROT 4.8*  ALBUMIN 1.2*   No results for input(s): LIPASE, AMYLASE in the last 72 hours. Cardiac Enzymes No results for input(s): CKTOTAL, CKMB, CKMBINDEX, TROPONINI in the last 72 hours.  BNP: BNP (last 3 results) No results for input(s): BNP in the last 8760 hours.  ProBNP (last 3 results)  No results for input(s): PROBNP in the last 8760 hours.   D-Dimer No results for input(s): DDIMER in the last 72 hours. Hemoglobin A1C No results for input(s): HGBA1C in the last 72 hours. Fasting Lipid Panel Recent Labs    08/26/19 0730  TRIG 165*   Thyroid Function Tests No results for input(s): TSH, T4TOTAL, T3FREE, THYROIDAB in the last 72 hours.  Invalid input(s): FREET3  Other results:   Imaging    No results found.   Medications:     Scheduled Medications: . sodium chloride   Intravenous Once  . chlorhexidine  15 mL Mouth/Throat BID  . Chlorhexidine Gluconate Cloth  6 each Topical Daily  . digoxin  0.125 mg Per Tube Daily  . feeding supplement  1 Container Oral TID BM  . folic acid  1 mg Per Tube Daily  . gabapentin  200 mg Per Tube Q12H  . lactulose  30 g Per Tube Q6H  . mouth rinse  15 mL Mouth Rinse BID  . multivitamin with minerals  1 tablet Per Tube Daily  . nicotine  21 mg Transdermal Daily  . [START ON  08/29/2019] pantoprazole  40 mg Intravenous Q12H  . potassium chloride  40 mEq Oral Once  . rifaximin  550 mg Per Tube BID  . sodium chloride flush  10 mL Intravenous Q12H  . sodium chloride flush  10 mL Intravenous Q12H  . spironolactone  12.5 mg Per Tube Daily  . thiamine  100 mg Per Tube Daily    Infusions: . sodium chloride Stopped (08/25/19 2202)  . DOBUTamine 2.5 mcg/kg/min (08/26/19 0800)  . heparin 1,250 Units/hr (08/26/19 2100)  . norepinephrine (LEVOPHED) Adult infusion Stopped (08/26/19 0945)  . pantoprozole (PROTONIX) infusion 8 mg/hr (08/26/19 2351)  . potassium chloride 10 mEq (08/27/19 0819)  . propofol (DIPRIVAN) infusion Stopped (08/26/19 0930)    PRN Medications: sodium chloride, acetaminophen, albuterol, dextromethorphan-guaiFENesin, fentaNYL (SUBLIMAZE) injection, hydrALAZINE, labetalol, ondansetron (ZOFRAN) IV, ondansetron **OR** [DISCONTINUED] ondansetron (ZOFRAN) IV, oxyCODONE, sodium chloride flush   Assessment/Plan   1. Acute on chronic systolic CHF/cardiogenic shock: Echo with EF 15-20%, mild LV dilation, LV thrombus, mild-moderate MR, severely decreased RV systolic function. Cause of cardiomyopathy is uncertain, but she was a heavy smoker and ECG is suggestive of possible old ASMI. Ischemic cardiomyopathy is possible, alternatively could be due to ETOH abuse (>6 pack/day per husband) or stress cardiomyopathy with critical illness.  Lactate was elevated at admission.  Suspect cardiogenic shock with low BP, cannot rule out component of septic shock. Currently on dobutamine 2.5 with stable co-ox (48% pre-dobutamine, 76% today) and stable MAP.  CVP 5.  - No Lasix today.  - Continue spironolactone 12.5 mg daily.  - Continue digoxin 0.125.  - Would not add ARB/ARNI yet, BP still soft.  - Start tapering down dobutamine, decrease to 1.5 mcg/kg/min today. - Not candidate for advanced therapies - Eventual diagnostic heart cath when bleeding settles down.  2. 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 Markedly elevated bilirubin initially with jaundice.  However liver imaging with [place] and CT has NOT shown cirrhosis or portal/hepatic vein thrombosis. Question has arisen whether this might possibly be due to primarily to RV failure.  Time course seems quite acute for RV failure.  Jaundice/icterus improving.  - holding stain and other hepato-toxic agents  - Follow CMET daily.  3. Encephalopathy: ?Cause.  NH3 has not been markedly high but certainly could be hepatic encephalopathy.  Shock likely played a role.  She is much clearer this morning.  - Continue lactulose/rifaxamin per GI.  4. AKI: Creatinine improved to 0.96 with inotropic support.  Suspect cardiorenal syndrome, also had contrast with LE angiograms so possible component of contrast nephropathy.  5. LV thrombus: Possible cause of embolic event to popliteal arteries as well as subacute CVA.  Heparin gtt held with active GI bleeding initially, then restarted 9/24. INR up to 4.0 with poor nutrition. Still with ongoing melena but has slowed some.  - With embolic events recently, will continue heparin gtt at low therapeutic range despite GI bleeding.  - She got 1 mg vitamin with high INR earlier this week, repeat INR today.   6. PAD: Bilateral popliteal artery occlusions, suspect cardioembolic from LV thrombus.  Now s/p mechanical thrombectomy by vascular. Right foot still looks ischemic but dopplerable pulses.  7. 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  Treatment will be anticoagulation.  8. Right popliteal vein DVT: Anticoagulated.  9. GI bleeding: Upper GI bleeding, EGD with bleeding duodenal and gastric ulcer on 9/29 that were treated, no varices.  Still with melena in rectal bag today.  Hgb 7.5.   - Off octreotide, continue Protonix per GI (currently gtt).   - Transfuse 1 unit PRBCs today.  10. ETOH abuse: Per husband, at least a 6 pack daily.  11. ID: Concern for  aspiration PNA. - Completed course of ceftriaxone.  12. Deconditioning: Out of bed to chair, work with PT.    Length of Stay: 31  Marca Ancona, MD  08/27/2019, 8:20 AM  Advanced Heart Failure Team Pager [phone] (M-F; 7a - 4p)  Please contact [facility] for night-coverage after hours (4p -7a ) and weekends on [email or web address]

## 2019-08-27 NOTE — Progress Notes (Signed)
ANTICOAGULATION CONSULT NOTE - Follow Up Consult  Pharmacy Consult for Heparin Indication: DVT, LV thrombus, and acute CVA  Allergies  Allergen Reactions  . Penicillins Other (See Comments)    Unsure of reaction Did it involve swelling of the face/tongue/throat, SOB, or low BP? Unknown Did it involve sudden or severe rash/hives, skin peeling, or any reaction on the inside of your mouth or nose? Unknown Did you need to seek medical attention at a hospital or doctor's office? Unknown When did it last happen?[place] If all above answers are "NO", may proceed with cephalosporin use.    Patient Measurements: Height: 5\' 5"  (165.1 cm) Weight: 194 lb 14.2 oz (88.4 kg) IBW/kg (Calculated) : 57 Heparin Dosing Weight: 75 kg  Vital Signs: Temp: 97.6 F (36.4 C) (09/30 1127) Temp Source: Axillary (09/30 1127) BP: 102/59 (09/30 1127) Pulse Rate: 66 (09/30 1127)  Labs: Recent Labs    08/25/19 0400  08/25/19 0437  08/26/19 0400 08/26/19 0730 08/26/19 1212 08/26/19 1948 08/27/19 0200 08/27/19 1123  HGB 7.7*  --   --    < > 9.6*  --  9.3* 8.0* 7.5*  --   HCT 22.6*  --   --    < > 28.2*  --  28.1* 23.7* 22.8*  --   PLT 212  --   --   --  278  --   --   --  284  --   LABPROT  --   --  38.5*  --   --   --   --   --   --  21.3*  INR  --   --  4.0*  --   --   --   --   --   --  1.9*  HEPARINUNFRC  --    < >  --   --   --   --  0.41  --  0.21* 0.41  CREATININE  --   --  1.17*  --   --  1.09*  --   --  0.96  --    < > = values in this interval not displayed.    Estimated Creatinine Clearance: 73.6 mL/min (by C-G formula based on SCr of 0.96 mg/dL).   Medications:  Scheduled:  . sodium chloride   Intravenous Once  . chlorhexidine  15 mL Mouth/Throat BID  . Chlorhexidine Gluconate Cloth  6 each Topical Daily  . digoxin  0.125 mg Per Tube Daily  . feeding supplement  1 Container Oral TID BM  . folic acid  1 mg Per Tube Daily  . gabapentin  200 mg Per Tube Q12H  . lactulose   30 g Per Tube Q6H  . mouth rinse  15 mL Mouth Rinse BID  . multivitamin with minerals  1 tablet Per Tube Daily  . nicotine  21 mg Transdermal Daily  . [START ON 08/29/2019] pantoprazole  40 mg Intravenous Q12H  . potassium chloride  40 mEq Oral Once  . rifaximin  550 mg Per Tube BID  . sodium chloride flush  10 mL Intravenous Q12H  . sodium chloride flush  10 mL Intravenous Q12H  . spironolactone  12.5 mg Per Tube Daily  . thiamine  100 mg Per Tube Daily   Infusions:  . sodium chloride Stopped (08/25/19 2202)  . DOBUTamine 1.5 mcg/kg/min (08/27/19 0823)  . heparin 1,250 Units/hr (08/26/19 2100)  . norepinephrine (LEVOPHED) Adult infusion Stopped (08/26/19 0945)  . pantoprozole (PROTONIX) infusion 8 mg/hr (08/26/19 2351)  .  propofol (DIPRIVAN) infusion Stopped (08/26/19 0930)    Assessment: [name] on heparin for LV thrombus and bilateral lower extremity ischemia w/ right popliteal vein DVT now s/p angioplasty and R/L popliteal thrombectomy of the tibial artery. Heparin therapy complicated by acute CVA and recent GI bleed and need for lower goal. Plans noted for coumadin when no further procedures   She is s/p mech thrombectomy of L popliteal 9/23. Heparin stopped 09/23 @ 1750 due to a GI bleed. Pt completed course of octreotide. Endoscopy this morning stopped pyloric channel ulcer and duodenal ulcer. Pt placed on IV PPI for treatment of ulcers. Heparin to continue due to LV thrombus per Dr. Shirlee Latch. H/H down to 7.5   Heparin level is within goal at 0.41 on heparin rate of 1,250 units/hr. No new sources of bleeding noted or issues with infusion per RN.  Goal of Therapy:  Heparin level 0.3-0.5 units/ml Monitor platelets by anticoagulation protocol: Yes   Plan:  - Continue heparin 1250 units/hr - Will be conservative with heparin dosing due to bleeding - Daily heparin level and CBC - Monitor for signs/symptoms of bleeding  Jenetta Downer, [facility] Clinical Pharmacist Phone  [phone]  08/27/2019 12:58 PM

## 2019-08-27 NOTE — Progress Notes (Signed)
Notified by CCMD that patient had 22 beat run vtach. Notified e-link. Orders given to draw labs (0200). This RN expressed her concerns of the patient's bleeding rectum vs vagina?? Upon arrival to unit, patient had a gross amount of old looking blood that could not be differentiated if it was coming from the fexiseal (around it) or the vagina.  During report, this RN was told that the patient was on her menses. Ob/gyn status showing post menopausal. Patient was asked if she still got her menses, patient stated "no". Patient also asked if she still had her uterus and the patient stated "yes". Patient commented that she had some spotting but it went away.   Orders given to replace potassium, give one unit of blood  In addition, patient's meds need to be changed over to PO meds instead of by tube. Will continue to monitor

## 2019-08-28 DIAGNOSIS — R579 Shock, unspecified: Secondary | ICD-10-CM

## 2019-08-28 DIAGNOSIS — M549 Dorsalgia, unspecified: Secondary | ICD-10-CM

## 2019-08-28 LAB — COMPREHENSIVE METABOLIC PANEL
ALT: 33 U/L (ref 0–44)
AST: 48 U/L — ABNORMAL HIGH (ref 15–41)
Albumin: <1 g/dL — ABNORMAL LOW (ref 3.5–5.0)
Alkaline Phosphatase: 120 U/L (ref 38–126)
Anion gap: 5 (ref 5–15)
BUN: 18 mg/dL (ref 6–20)
CO2: 21 mmol/L — ABNORMAL LOW (ref 22–32)
Calcium: 6.7 mg/dL — ABNORMAL LOW (ref 8.9–10.3)
Chloride: 107 mmol/L (ref 98–111)
Creatinine, Ser: 0.85 mg/dL (ref 0.44–1.00)
GFR calc Af Amer: >60 mL/min (ref >60)
GFR calc non Af Amer: >60 mL/min (ref >60)
Glucose, Bld: 92 mg/dL (ref 70–99)
Potassium: 3.8 mmol/L (ref 3.5–5.1)
Sodium: 133 mmol/L — ABNORMAL LOW (ref 135–145)
Total Bilirubin: 4.5 mg/dL — ABNORMAL HIGH (ref 0.3–1.2)
Total Protein: 4.5 g/dL — ABNORMAL LOW (ref 6.5–8.1)

## 2019-08-28 LAB — HEMOGLOBIN AND HEMATOCRIT, BLOOD
HCT: 21.9 % — ABNORMAL LOW (ref 36.0–46.0)
HCT: 22.5 % — ABNORMAL LOW (ref 36.0–46.0)
Hemoglobin: 7.5 g/dL — ABNORMAL LOW (ref 12.0–15.0)
Hemoglobin: 7.5 g/dL — ABNORMAL LOW (ref 12.0–15.0)

## 2019-08-28 LAB — TYPE AND SCREEN
ABO/RH(D): A POS
Antibody Screen: NEGATIVE
Unit division: 0
Unit division: 0

## 2019-08-28 LAB — BPAM RBC
Blood Product Expiration Date: 202010042359
Blood Product Expiration Date: 202010222359
ISSUE DATE / TIME: 202009281103
ISSUE DATE / TIME: 202009301122
Unit Type and Rh: 600
Unit Type and Rh: 6200

## 2019-08-28 LAB — CBC
HCT: 22.7 % — ABNORMAL LOW (ref 36.0–46.0)
Hemoglobin: 7.9 g/dL — ABNORMAL LOW (ref 12.0–15.0)
MCH: 32.4 pg (ref 26.0–34.0)
MCHC: 34.8 g/dL (ref 30.0–36.0)
MCV: 93 fL (ref 80.0–100.0)
Platelets: 256 10*3/uL (ref 150–400)
RBC: 2.44 MIL/uL — ABNORMAL LOW (ref 3.87–5.11)
RDW: 19.4 % — ABNORMAL HIGH (ref 11.5–15.5)
WBC: 10.7 10*3/uL — ABNORMAL HIGH (ref 4.0–10.5)
nRBC: 0 % (ref 0.0–0.2)

## 2019-08-28 LAB — COOXEMETRY PANEL
Carboxyhemoglobin: 2.1 % — ABNORMAL HIGH (ref 0.5–1.5)
Methemoglobin: 0.7 % (ref 0.0–1.5)
O2 Saturation: 65.1 %
Total hemoglobin: 8 g/dL — ABNORMAL LOW (ref 12.0–16.0)

## 2019-08-28 LAB — HEPARIN LEVEL (UNFRACTIONATED): Heparin Unfractionated: 0.42 IU/mL (ref 0.30–0.70)

## 2019-08-28 MED ORDER — OXYCODONE HCL 5 MG PO TABS
5.0000 mg | ORAL_TABLET | ORAL | Status: DC | PRN
  Administered 2019-08-29 – 2019-08-30 (×2): 5 mg via ORAL
  Filled 2019-08-28 (×3): qty 1

## 2019-08-28 MED ORDER — WHITE PETROLATUM EX OINT
TOPICAL_OINTMENT | CUTANEOUS | Status: DC | PRN
  Filled 2019-08-28: qty 28.35

## 2019-08-28 MED ORDER — ACETAMINOPHEN 325 MG PO TABS: 650.0000 mg | ORAL_TABLET | ORAL | Status: DC | PRN

## 2019-08-28 MED ORDER — SPIRONOLACTONE 25 MG PO TABS
25.0000 mg | ORAL_TABLET | Freq: Every day | ORAL | Status: DC
  Administered 2019-08-29 – 2019-09-03 (×5): 25 mg via ORAL
  Filled 2019-08-28 (×6): qty 1

## 2019-08-28 MED ORDER — LOSARTAN POTASSIUM 25 MG PO TABS
12.5000 mg | ORAL_TABLET | Freq: Every day | ORAL | Status: DC
  Administered 2019-08-28: 22:00:00 12.5 mg via ORAL
  Filled 2019-08-28 (×3): qty 1

## 2019-08-28 MED ORDER — RIFAXIMIN 550 MG PO TABS
550.0000 mg | ORAL_TABLET | Freq: Two times a day (BID) | ORAL | Status: DC
  Administered 2019-08-28 – 2019-09-13 (×31): 550 mg via ORAL
  Filled 2019-08-28 (×32): qty 1

## 2019-08-28 MED ORDER — SPIRONOLACTONE 12.5 MG HALF TABLET
12.5000 mg | ORAL_TABLET | Freq: Every day | ORAL | Status: DC
  Filled 2019-08-28: qty 1

## 2019-08-28 MED ORDER — GABAPENTIN 100 MG PO CAPS
200.0000 mg | ORAL_CAPSULE | Freq: Two times a day (BID) | ORAL | Status: DC
  Administered 2019-08-28 – 2019-09-13 (×32): 200 mg via ORAL
  Filled 2019-08-28 (×33): qty 2

## 2019-08-28 MED ORDER — VITAMIN B-1 100 MG PO TABS
100.0000 mg | ORAL_TABLET | Freq: Every day | ORAL | Status: DC
  Administered 2019-08-29 – 2019-09-13 (×15): 100 mg via ORAL
  Filled 2019-08-28 (×16): qty 1

## 2019-08-28 MED ORDER — DIGOXIN 125 MCG PO TABS
0.1250 mg | ORAL_TABLET | Freq: Every day | ORAL | Status: DC
  Administered 2019-08-29 – 2019-09-09 (×11): 0.125 mg via ORAL
  Filled 2019-08-28 (×12): qty 1

## 2019-08-28 MED ORDER — FOLIC ACID 1 MG PO TABS
1.0000 mg | ORAL_TABLET | Freq: Every day | ORAL | Status: DC
  Administered 2019-08-29 – 2019-09-13 (×15): 1 mg via ORAL
  Filled 2019-08-28 (×16): qty 1

## 2019-08-28 NOTE — Progress Notes (Signed)
Patient ID: Briana Andrews, female   DOB: [birth year], 53 y.o.   MRN: [id number]     Advanced Heart Failure Rounding Note  PCP-Cardiologist: Tobias Alexander, MD   Subjective:    Remains on dobutamine 1.5 mcg/kg/min.  Co-ox 65%.  CVP 6.  Tbili better, down to 4.5.  Awake and alert this morning.  Right foot not painful.  Worked with PT yesterday.   Run of NSVT last night.   Hgb 7.9 today, she got 1 unit PRBCs yesterday.  Some melena, likely residual blood per GI. She remains on Protonix gtt.   Heparin gtt ongoing.   EGD (9/29): Bleeding gastric and duodenal ulcers treated in endoscopy suite with resolution of bleeding. No varices.    Objective:   Weight Range: 86.2 kg Body mass index is 31.62 kg/m.   Vital Signs:   Temp:  [98.2 F (36.8 C)-98.8 F (37.1 C)] 98.6 F (37 C) (10/01 0746) Pulse Rate:  [73-83] 82 (10/01 0855) Resp:  [19-22] 20 (10/01 0746) BP: (98-110)/(51-82) 100/59 (10/01 0746) SpO2:  [97 %-100 %] 99 % (10/01 0746) Weight:  [86.2 kg] 86.2 kg (10/01 0445) Last BM Date: 08/27/19(flexiseal)  Weight change: Filed Weights   08/26/19 2100 08/27/19 0500 08/28/19 0445  Weight: 88.4 kg 88.4 kg 86.2 kg    Intake/Output:   Intake/Output Summary (Last 24 hours) at 08/28/2019 1146 Last data filed at 08/28/2019 0445 Gross per 24 hour  Intake 1409.91 ml  Output 750 ml  Net 659.91 ml      Physical Exam   CVP 6  General: NAD Neck: No JVD, no thyromegaly or thyroid nodule.  Lungs: Clear to auscultation bilaterally with normal respiratory effort. CV: Nondisplaced PMI.  Heart regular S1/S2, no S3/S4, no murmur.  1+ edema to knees bilaterally.   Abdomen: Soft, nontender, no hepatosplenomegaly, no distention.  Skin: Intact without lesions or rashes.  Neurologic: Alert and oriented x 3.  Psych: Normal affect. Extremities: Ischemic changes right foot.  HEENT: Icterus improved.    Telemetry  NSR 70s with 1 short run NSVT, personally checked.   Labs    CBC  Recent Labs    08/27/19 1645 08/27/19 2000 08/28/19 0400  WBC 12.1*  --  10.7*  HGB 8.5* 8.2* 7.9*  HCT 26.0* 23.7* 22.7*  MCV 97.0  --  93.0  PLT 261  --  256   Basic Metabolic Panel Recent Labs    86/76/72 0400  08/27/19 0200 08/28/19 0400  NA  --    < > 135 133*  K  --    < > 3.3* 3.8  CL  --    < > 108 107  CO2  --    < > 22 21*  GLUCOSE  --    < > 112* 92  BUN  --    < > 22* 18  CREATININE  --    < > 0.96 0.85  CALCIUM  --    < > 7.0* 6.7*  MG 1.5*  --  1.9  --    < > = values in this interval not displayed.   Liver Function Tests Recent Labs    08/28/19 0400  AST 48*  ALT 33  ALKPHOS 120  BILITOT 4.5*  PROT 4.5*  ALBUMIN <1.0*   No results for input(s): LIPASE, AMYLASE in the last 72 hours. Cardiac Enzymes No results for input(s): CKTOTAL, CKMB, CKMBINDEX, TROPONINI in the last 72 hours.  BNP: BNP (last 3 results) No results for input(s): BNP  in the last 8760 hours.  ProBNP (last 3 results) No results for input(s): PROBNP in the last 8760 hours.   D-Dimer No results for input(s): DDIMER in the last 72 hours. Hemoglobin A1C No results for input(s): HGBA1C in the last 72 hours. Fasting Lipid Panel Recent Labs    08/26/19 0730  TRIG 165*   Thyroid Function Tests No results for input(s): TSH, T4TOTAL, T3FREE, THYROIDAB in the last 72 hours.  Invalid input(s): FREET3  Other results:   Imaging    No results found.   Medications:     Scheduled Medications: . sodium chloride   Intravenous Once  . chlorhexidine  15 mL Mouth/Throat BID  . Chlorhexidine Gluconate Cloth  6 each Topical Daily  . [START ON 08/29/2019] digoxin  0.125 mg Oral Daily  . feeding supplement  1 Container Oral TID BM  . [START ON 08/29/2019] folic acid  1 mg Oral Daily  . gabapentin  200 mg Oral BID  . lactulose  30 g Per Tube Q6H  . mouth rinse  15 mL Mouth Rinse BID  . multivitamin with minerals  1 tablet Per Tube Daily  . nicotine  21 mg Transdermal Daily  .  [START ON 08/29/2019] pantoprazole  40 mg Intravenous Q12H  . potassium chloride  40 mEq Oral Once  . rifaximin  550 mg Oral BID  . sodium chloride flush  10 mL Intravenous Q12H  . sodium chloride flush  10 mL Intravenous Q12H  . [START ON 08/29/2019] spironolactone  25 mg Oral Daily  . [START ON 08/29/2019] thiamine  100 mg Oral Daily    Infusions: . sodium chloride Stopped (08/25/19 2202)  . DOBUTamine 1.5 mcg/kg/min (08/27/19 0823)  . heparin 1,250 Units/hr (08/28/19 0339)  . norepinephrine (LEVOPHED) Adult infusion Stopped (08/27/19 2055)  . pantoprozole (PROTONIX) infusion 8 mg/hr (08/28/19 1000)  . propofol (DIPRIVAN) infusion Stopped (08/26/19 0930)    PRN Medications: sodium chloride, acetaminophen, albuterol, dextromethorphan-guaiFENesin, fentaNYL (SUBLIMAZE) injection, hydrALAZINE, labetalol, ondansetron (ZOFRAN) IV, oxyCODONE, sodium chloride flush   Assessment/Plan   1. Acute on chronic systolic CHF/cardiogenic shock: Echo with EF 15-20%, mild LV dilation, LV thrombus, mild-moderate MR, severely decreased RV systolic function. Cause of cardiomyopathy is uncertain, but she was a heavy smoker and ECG is suggestive of possible old ASMI. Ischemic cardiomyopathy is possible, alternatively could be due to ETOH abuse (>6 pack/day per husband) or stress cardiomyopathy with critical illness.  Lactate was elevated at admission.  Suspect cardiogenic shock with low BP, cannot rule out component of septic shock. Currently on dobutamine 1.5 with stable co-ox (48% pre-dobutamine, 65% today) and stable MAP.  CVP 6.  - No Lasix today.  - Increase spironolactone to 25 mg daily and add low dose losartan 12.5 daily.   - Continue digoxin 0.125.  - Stop dobutamine today. - Not candidate for advanced therapies - Eventual diagnostic heart cath when bleeding settles down, aim for Monday.  2. 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 Markedly elevated bilirubin initially with jaundice.   However liver imaging with [place] and CT has NOT shown cirrhosis or portal/hepatic vein thrombosis. Question has arisen whether this might possibly be due to primarily to RV failure.  Time course seems quite acute for RV failure.  Jaundice/icterus improving.  - holding stain and other hepato-toxic agents  - Follow CMET daily.  3. Encephalopathy: ?Cause.  NH3 has not been markedly high but certainly could be hepatic encephalopathy.  Shock likely played a role.  She is  much clearer now.  - Continue lactulose/rifaxamin per GI.  4. AKI: Creatinine improved with inotropic support.  Suspect cardiorenal syndrome, also had contrast with LE angiograms so possible component of contrast nephropathy.  5. LV thrombus: Possible cause of embolic event to popliteal arteries as well as subacute CVA.  Heparin gtt held with active GI bleeding initially, then restarted 9/24. INR up to 4.0 with poor nutrition, down to 1.9 after 1 mg po vitamin K earlier in stay. - With embolic events recently, will continue heparin gtt at low therapeutic range despite GI bleeding.    6. PAD: Bilateral popliteal artery occlusions, suspect cardioembolic from LV thrombus.  Now s/p mechanical thrombectomy by vascular. Right foot still looks ischemic but dopplerable pulses.  7. 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  Treatment will be anticoagulation.  8. Right popliteal vein DVT: Anticoagulated.  9. GI bleeding: Upper GI bleeding, EGD with bleeding duodenal and gastric ulcer on 9/29 that were treated, no varices.  Still with melena in rectal bag today but slowing, GI thinks just residual old blood and not active bleeding.  She had 1 unit PRBCs on 9/30.  Hgb 7.9.   - Off octreotide, continue Protonix per GI (currently gtt).   - No transfusion for now.  10. ETOH abuse: Per husband, at least a 6 pack daily. Plans to quit.  11. ID: Concern for aspiration PNA. - Completed course of ceftriaxone.  12. Deconditioning: Out of  bed to chair, work with PT.    Length of Stay: 15  Loralie Champagne, MD  08/28/2019, 11:46 AM  Advanced Heart Failure Team Pager [phone] (M-F; 7a - 4p)  Please contact [facility] for night-coverage after hours (4p -7a ) and weekends on [email or web address]

## 2019-08-28 NOTE — Progress Notes (Signed)
[facility] Gastroenterology Progress Note  Briana Andrews 53 y.o. [birth year]   Subjective: Black stools reported overnight. Denies abdominal pain. Sitting up in bed eating breakfast.  Objective: Vital signs: Vitals:   08/28/19 0423 08/28/19 0746  BP: (!) 107/55 (!) 100/59  Pulse: 77 75  Resp:  20  Temp: 98.2 F (36.8 C) 98.6 F (37 C)  SpO2: 98% 99%    Physical Exam: Gen: lethargic, jaundice, no acute distress  HEENT: +icteric sclera CV: RRR Chest: CTA B Abd: epigastric tenderness with guarding, soft, nondistended, +BS  Lab Results: Recent Labs    08/26/19 0400  08/27/19 0200 08/28/19 0400  NA  --    < > 135 133*  K  --    < > 3.3* 3.8  CL  --    < > 108 107  CO2  --    < > 22 21*  GLUCOSE  --    < > 112* 92  BUN  --    < > 22* 18  CREATININE  --    < > 0.96 0.85  CALCIUM  --    < > 7.0* 6.7*  MG 1.5*  --  1.9  --    < > = values in this interval not displayed.   Recent Labs    08/28/19 0400  AST 48*  ALT 33  ALKPHOS 120  BILITOT 4.5*  PROT 4.5*  ALBUMIN <1.0*   Recent Labs    08/27/19 1645 08/27/19 2000 08/28/19 0400  WBC 12.1*  --  10.7*  HGB 8.5* 8.2* 7.9*  HCT 26.0* 23.7* 22.7*  MCV 97.0  --  93.0  PLT 261  --  256      Assessment/Plan: Decompensated cirrhosis with recent peptic ulcer bleed. Melena likely residual blood. Hgb 7.9. Continue Protonix drip and change to IV Q 12 hours tomorrow night as previously ordered unless active bleeding recurs. Supportive care. INR 1.9. On IV heparin. Will sign off. Call if questions.   [name] 08/28/2019, 8:42 AM  Questions please call [phone] ID: Briana Andrews, female   DOB: [birth year], 53 y.o.   MRN: [id number]

## 2019-08-28 NOTE — Progress Notes (Signed)
ANTICOAGULATION CONSULT NOTE - Follow Up Consult  Pharmacy Consult for Heparin Indication: DVT, LV thrombus, and acute CVA  Allergies  Allergen Reactions  . Penicillins Other (See Comments)    Unsure of reaction Did it involve swelling of the face/tongue/throat, SOB, or low BP? Unknown Did it involve sudden or severe rash/hives, skin peeling, or any reaction on the inside of your mouth or nose? Unknown Did you need to seek medical attention at a hospital or doctor's office? Unknown When did it last happen?[place] If all above answers are "NO", may proceed with cephalosporin use.    Patient Measurements: Height: 5\' 5"  (165.1 cm) Weight: 190 lb 0.6 oz (86.2 kg) IBW/kg (Calculated) : 57 Heparin Dosing Weight: 75 kg  Vital Signs: Temp: 98.7 F (37.1 C) (10/01 1300) Temp Source: Oral (10/01 1300) BP: 106/54 (10/01 1200) Pulse Rate: 79 (10/01 1200)  Labs: Recent Labs    08/26/19 0730  08/27/19 0200 08/27/19 1123 08/27/19 1645 08/27/19 2000 08/28/19 0400 08/28/19 0429  HGB  --    < > 7.5*  --  8.5* 8.2* 7.9*  --   HCT  --    < > 22.8*  --  26.0* 23.7* 22.7*  --   PLT  --   --  284  --  261  --  256  --   LABPROT  --   --   --  21.3*  --   --   --   --   INR  --   --   --  1.9*  --   --   --   --   HEPARINUNFRC  --    < > 0.21* 0.41  --   --   --  0.42  CREATININE 1.09*  --  0.96  --   --   --  0.85  --    < > = values in this interval not displayed.    Estimated Creatinine Clearance: 82.1 mL/min (by C-G formula based on SCr of 0.85 mg/dL).   Medications:  Scheduled:  . sodium chloride   Intravenous Once  . chlorhexidine  15 mL Mouth/Throat BID  . Chlorhexidine Gluconate Cloth  6 each Topical Daily  . [START ON 08/29/2019] digoxin  0.125 mg Oral Daily  . feeding supplement  1 Container Oral TID BM  . [START ON 81/06/2992] folic acid  1 mg Oral Daily  . gabapentin  200 mg Oral BID  . lactulose  30 g Per Tube Q6H  . losartan  12.5 mg Oral Daily  . mouth  rinse  15 mL Mouth Rinse BID  . multivitamin with minerals  1 tablet Per Tube Daily  . nicotine  21 mg Transdermal Daily  . [START ON 08/29/2019] pantoprazole  40 mg Intravenous Q12H  . potassium chloride  40 mEq Oral Once  . rifaximin  550 mg Oral BID  . sodium chloride flush  10 mL Intravenous Q12H  . sodium chloride flush  10 mL Intravenous Q12H  . [START ON 08/29/2019] spironolactone  25 mg Oral Daily  . [START ON 08/29/2019] thiamine  100 mg Oral Daily   Infusions:  . sodium chloride Stopped (08/25/19 2202)  . heparin 1,250 Units/hr (08/28/19 0339)  . norepinephrine (LEVOPHED) Adult infusion Stopped (08/27/19 2055)  . pantoprozole (PROTONIX) infusion 8 mg/hr (08/28/19 1000)  . propofol (DIPRIVAN) infusion Stopped (08/26/19 0930)    Assessment: [name] on heparin for LV thrombus and bilateral lower extremity ischemia w/ right popliteal vein DVT  now s/p angioplasty and R/L popliteal thrombectomy of the tibial artery. Heparin therapy complicated by acute CVA and recent GI bleed and need for lower goal. Plans noted for coumadin when no further procedures   She is s/p mech thrombectomy of L popliteal 9/23. Heparin stopped 09/23 @ 1750 due to a GI bleed. Pt completed course of octreotide. Pt placed on IV PPI for treatment of ulcers. Heparin to continue due to LV thrombus per Dr. Shirlee Latch. H/H down to 7.9     Heparin level is within goal at 0.42 on heparin rate of 1,250 units/hr. No new sources of bleeding noted or issues with infusion per RN.  Goal of Therapy:  Heparin level 0.3-0.5 units/ml Monitor platelets by anticoagulation protocol: Yes   Plan:  - Continue heparin 1250 units/hr - Will be conservative with heparin dosing due to bleeding - Daily heparin level and CBC - Monitor for signs/symptoms of bleeding  Jenetta Downer, [facility] Clinical Pharmacist Phone [phone]  08/28/2019 1:25 PM

## 2019-08-28 NOTE — Progress Notes (Signed)
  Speech Language Pathology Treatment: Cognitive-Linquistic  Patient Details Name: Briana Andrews MRN: [id number] DOB: 1966-08-25 Today's Date: 08/28/2019 Time: 1211-1229 SLP Time Calculation (min) (ACUTE ONLY): 18 min  Assessment / Plan / Recommendation Clinical Impression  Pt is alert and able to participate in therapy at this time. Extubated 9/29.  Presently on liquid diet as GIB resolves. Please consult ST for swallowing, if needed.  Pt answered orientation questions with 50% accuracy.  Demonstrated ability to use external aids when oriented to clock/calendar.  Pt demonstrated use of call bell for assistance, but needed cuing to initiate.  Pt completed calculations for functional problem solving with 20% accuracy.  With maximum verbal and visual cuing pt improved to 60% accuracy.  Throughout tasks, pt kept saying she was having trouble paying attention and requested repetitions. "I'm sorry. I keep spacing out."  Poor attention will remain a barrier to learning and carryover. Pt agreeable to continuing speech therapy.   HPI HPI: 53 year old female with history of smoking, alcohol use, she drinks more than 6 packs of beer every day since last 20 years, no chronic medical issues brought to the emergency room with confusion more than usual and falls.  Since last few months there have been noticing some changes on her including weakness on her legs and poor appetite as much as she stopped smoking and decreased her drinking.  2 weeks ago, she was taken to [facility] with a fall, skeletal survey was negative except L1 fracture with 25% height loss and discharged home on muscle relaxants.  Patient has been intermittently confused since then. In the emergency room, she was found with subacute right ACA stroke, jaundice with bilirubin of 12, bilateral multifocal pneumonia and metabolic encephalopathy. Intubated 9/28-9/29. GIB resolving      SLP Plan  Continue with current plan of care       Recommendations         Oral Care Recommendations: Oral care BID Follow up Recommendations: (Continue ST at next level of care) SLP Visit Diagnosis: Attention and concentration deficit Attention and concentration deficit following: Cerebral infarction Plan: Continue with current plan of care       [place], [place], [place] Office: [phone] 08/28/2019, 12:37 PM

## 2019-08-28 NOTE — Progress Notes (Signed)
PROGRESS NOTE  Briana Andrews:[id number] DOB: Mar 13, 1966 DOA: 08/15/2019 PCP: Briana Andrews, [name]  Brief History   53 yo alcoholic woman admitted with R frontal CVA, B popliteal artery occlusions, hepatic failure, GI bleeding. She was found to have new cardiomyopathy with an LV thrombus, cirrhosis, ARF. Underwent B LE thrombectomies, was treated with heparin, stabilized. Has required hemodynamic support w dobutamine. Received PRBC and was able to undergo EGD 9/29 that showed duodenal and gastric ulcers which were treated endoscopically. Remains on dobuta, PPI gtt, heparin.   Consultants  . Palliative Care . PCCM . Heart Failure Team . Vascular Surgery . Cardiology  Procedures  . EGD/Epinephrine injection . Mechanical Intubation . Central Venous Catheter . Extubation . Arteriogram of right and left lower extremities . Mechanical thrombectomy of the left popliteal and posterior tibial artery using Penumbra CAT 6 device Mechanical thrombectomy of the right popliteal and posterior tibial artery using Penumbra CAT 6 device  Antibiotics   Anti-infectives (From admission, onward)   Start     Dose/Rate Route Frequency Ordered Stop   08/28/19 2200  rifaximin (XIFAXAN) tablet 550 mg     550 mg Oral 2 times daily 08/28/19 0934     08/26/19 1000  rifaximin (XIFAXAN) tablet 550 mg  Status:  Discontinued     550 mg Per Tube 2 times daily 08/25/19 2113 08/28/19 0935   08/20/19 1800  cefTRIAXone (ROCEPHIN) 2 g in sodium chloride 0.9 % 100 mL IVPB  Status:  Discontinued     2 g 200 mL/hr over 30 Minutes Intravenous Every 24 hours 08/20/19 1600 08/23/19 0825   08/18/19 0700  levofloxacin (LEVAQUIN) tablet 750 mg  Status:  Discontinued     750 mg Oral Every 48 hours 08/17/19 1205 08/20/19 1600   08/16/19 1115  rifaximin (XIFAXAN) tablet 550 mg  Status:  Discontinued     550 mg Oral 2 times daily 08/16/19 1114 08/25/19 2113   08/16/19 0700  levofloxacin (LEVAQUIN) IVPB 750 mg  Status:   Discontinued     750 mg 100 mL/hr over 90 Minutes Intravenous Every 48 hours 08/16/19 0616 08/17/19 1205    .  Subjective  The Briana Andrews is resting comfortably. No new complaints.   Objective   Vitals:  Vitals:   08/28/19 1200 08/28/19 1300  BP: (!) 106/54   Pulse: 79   Resp: (!) 22   Temp: 98.7 F (37.1 C) 98.7 F (37.1 C)  SpO2: 97%    Exam:  Constitutional:  . The Briana Andrews is awake, alert, and oriented x 3. No acute distress. Respiratory:  . No increased work of breathing. . No wheezes, rales, or rhonchi . No tactile fremitus Cardiovascular:  . Regular rate and rhythm . No murmurs, ectopy, or gallups. . No lateral PMI. No thrills. Abdomen:  . Abdomen is soft, non-tender, non-distended . No hernias, masses, or organomegaly . Normoactive bowel sounds.  Musculoskeletal:  . No cyanosis, clubbing, or edema Skin:  . No rashes, lesions, ulcers . palpation of skin: no induration or nodules Neurologic:  . CN 2-12 intact . Sensation all 4 extremities intact Psychiatric:  . Mental status o Mood, affect appropriate o Orientation to person, place, time  . judgment and insight appear intact  I have personally reviewed the following:   Today's Data  . Vitals, CBC, CMP  Scheduled Meds: . sodium chloride   Intravenous Once  . chlorhexidine  15 mL Mouth/Throat BID  . Chlorhexidine Gluconate Cloth  6 each Topical Daily  . [  START ON 08/29/2019] digoxin  0.125 mg Oral Daily  . feeding supplement  1 Container Oral TID BM  . [START ON 34/05/4258] folic acid  1 mg Oral Daily  . gabapentin  200 mg Oral BID  . lactulose  30 g Per Tube Q6H  . losartan  12.5 mg Oral Daily  . mouth rinse  15 mL Mouth Rinse BID  . multivitamin with minerals  1 tablet Per Tube Daily  . nicotine  21 mg Transdermal Daily  . [START ON 08/29/2019] pantoprazole  40 mg Intravenous Q12H  . potassium chloride  40 mEq Oral Once  . rifaximin  550 mg Oral BID  . sodium chloride flush  10 mL Intravenous  Q12H  . sodium chloride flush  10 mL Intravenous Q12H  . [START ON 08/29/2019] spironolactone  25 mg Oral Daily  . [START ON 08/29/2019] thiamine  100 mg Oral Daily   Continuous Infusions: . sodium chloride Stopped (08/25/19 2202)  . heparin 1,250 Units/hr (08/28/19 0339)  . norepinephrine (LEVOPHED) Adult infusion Stopped (08/27/19 2055)  . pantoprozole (PROTONIX) infusion 8 mg/hr (08/28/19 1000)  . 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 Dobutamine weaned to off.  The Briana Andrews was successfully extubated after EGD yesterday and respiratory status is stable on 4L [place]. Eventually she will need LHC to evaluate for biventricular failure.  LV clot on AC: No AC per cardiology. ASA 81 mg daily. Continue heparin drip. Monitor CBC.  GI Bleed: The Briana Andrews presented with melena with suspicion for upper GI source, intermittent transfusion requirements. EGD demonstrated oozing 36mm pre-pyloric ulcer. This was injected with Epi. Hgb 7.5 overnight and received 1 unit PRBC's. Hemoglobin is stable. He is tolerating Po fluids well.  Alcoholism, hyperbilirubinemia, clinically she has  cirrhosis although imaging features not c/w this; LFT pattern not c/w alcoholic hepatitis nor ischemic nor congestive. We will continue lactulose, rifaximin, thiamine and folate.  Encephalopathy: Combination from critical illness, hepatic, and CVA, still has asterixis, improving overall. Continue lactulose.  AKI: Improving related to initial shock, blood loss.  CVA: likely secondary to emboli from LV clot.  BL femoral artery occlusion s/p BL thrombectomy:  RLE does not look great but dopplerable pulses, vascular surgery aware, poor candidate for further procedures, monitor clinically.  Severe protein calorie malnutrition due to alcoholism: Nutrition Consult.  Rt sided pleural effusion without resp distress: likely due to hepatic hydrothorax vs. distrubutive from poor oncotic pressure. No indication to drain.  I have seen and examined this Briana Andrews myself. I have spent 35 minutes in her evaluation and care.  Spenser Cong, DO [facility] Direct contact: see [email or web address]  7PM-7AM contact night coverage as above 08/28/2019, 4:20 PM  LOS: 12 days

## 2019-08-29 DIAGNOSIS — L8990 Pressure ulcer of unspecified site, unspecified stage: Secondary | ICD-10-CM

## 2019-08-29 LAB — PREPARE RBC (CROSSMATCH)

## 2019-08-29 LAB — COMPREHENSIVE METABOLIC PANEL
ALT: 32 U/L (ref 0–44)
AST: 51 U/L — ABNORMAL HIGH (ref 15–41)
Albumin: <1 g/dL — ABNORMAL LOW (ref 3.5–5.0)
Alkaline Phosphatase: 166 U/L — ABNORMAL HIGH (ref 38–126)
Anion gap: 4 — ABNORMAL LOW (ref 5–15)
BUN: 17 mg/dL (ref 6–20)
CO2: 22 mmol/L (ref 22–32)
Calcium: 7 mg/dL — ABNORMAL LOW (ref 8.9–10.3)
Chloride: 108 mmol/L (ref 98–111)
Creatinine, Ser: 0.73 mg/dL (ref 0.44–1.00)
GFR calc Af Amer: >60 mL/min (ref >60)
GFR calc non Af Amer: >60 mL/min (ref >60)
Glucose, Bld: 77 mg/dL (ref 70–99)
Potassium: 3.8 mmol/L (ref 3.5–5.1)
Sodium: 134 mmol/L — ABNORMAL LOW (ref 135–145)
Total Bilirubin: 3.7 mg/dL — ABNORMAL HIGH (ref 0.3–1.2)
Total Protein: 4.3 g/dL — ABNORMAL LOW (ref 6.5–8.1)

## 2019-08-29 LAB — CBC WITH DIFFERENTIAL/PLATELET
Abs Immature Granulocytes: 0.25 10*3/uL — ABNORMAL HIGH (ref 0.00–0.07)
Basophils Absolute: 0 10*3/uL (ref 0.0–0.1)
Basophils Relative: 0 %
Eosinophils Absolute: 0.1 10*3/uL (ref 0.0–0.5)
Eosinophils Relative: 1 %
HCT: 20.3 % — ABNORMAL LOW (ref 36.0–46.0)
Hemoglobin: 7 g/dL — ABNORMAL LOW (ref 12.0–15.0)
Immature Granulocytes: 3 %
Lymphocytes Relative: 18 %
Lymphs Abs: 1.8 10*3/uL (ref 0.7–4.0)
MCH: 33 pg (ref 26.0–34.0)
MCHC: 34.5 g/dL (ref 30.0–36.0)
MCV: 95.8 fL (ref 80.0–100.0)
Monocytes Absolute: 0.4 10*3/uL (ref 0.1–1.0)
Monocytes Relative: 4 %
Neutro Abs: 7.2 10*3/uL (ref 1.7–7.7)
Neutrophils Relative %: 74 %
Platelets: 260 10*3/uL (ref 150–400)
RBC: 2.12 MIL/uL — ABNORMAL LOW (ref 3.87–5.11)
RDW: 20.4 % — ABNORMAL HIGH (ref 11.5–15.5)
WBC: 9.7 10*3/uL (ref 4.0–10.5)
nRBC: 0 % (ref 0.0–0.2)

## 2019-08-29 LAB — COOXEMETRY PANEL
Carboxyhemoglobin: 2.6 % — ABNORMAL HIGH (ref 0.5–1.5)
Methemoglobin: 1.1 % (ref 0.0–1.5)
O2 Saturation: 63.4 %
Total hemoglobin: 7.1 g/dL — ABNORMAL LOW (ref 12.0–16.0)

## 2019-08-29 LAB — PROTIME-INR
INR: 1.8 — ABNORMAL HIGH (ref 0.8–1.2)
Prothrombin Time: 20.8 seconds — ABNORMAL HIGH (ref 11.4–15.2)

## 2019-08-29 LAB — HEMOGLOBIN AND HEMATOCRIT, BLOOD
HCT: 26 % — ABNORMAL LOW (ref 36.0–46.0)
Hemoglobin: 8.7 g/dL — ABNORMAL LOW (ref 12.0–15.0)

## 2019-08-29 LAB — HEPARIN LEVEL (UNFRACTIONATED): Heparin Unfractionated: 0.47 IU/mL (ref 0.30–0.70)

## 2019-08-29 MED ORDER — SODIUM CHLORIDE 0.9% IV SOLUTION: Freq: Once | INTRAVENOUS | Status: AC

## 2019-08-29 MED ORDER — LOSARTAN POTASSIUM 25 MG PO TABS
12.5000 mg | ORAL_TABLET | Freq: Two times a day (BID) | ORAL | Status: DC
  Administered 2019-08-29 – 2019-08-30 (×2): 12.5 mg via ORAL
  Filled 2019-08-29 (×2): qty 1

## 2019-08-29 MED ORDER — POTASSIUM CHLORIDE CRYS ER 20 MEQ PO TBCR
40.0000 meq | EXTENDED_RELEASE_TABLET | Freq: Once | ORAL | Status: AC
  Administered 2019-08-29: 40 meq via ORAL
  Filled 2019-08-29: qty 2

## 2019-08-29 MED ORDER — HEPARIN (PORCINE) 25000 UT/250ML-% IV SOLN
1000.0000 [IU]/h | INTRAVENOUS | Status: DC
  Administered 2019-08-29 – 2019-08-31 (×3): 1000 [IU]/h via INTRAVENOUS
  Filled 2019-08-29 (×2): qty 250

## 2019-08-29 NOTE — Progress Notes (Signed)
Physical Therapy Treatment Patient Details Name: Briana Andrews MRN: [id number] DOB: 07/09/1966 Today's Date: 08/29/2019    History of Present Illness 53 year old female with history of smoking, alcohol use, she drinks more than 6 packs of beer every day since last 20 years, no chronic medical issues brought to the emergency room with confusion more than usual and falls.  Since last few months there have been noticing some changes on her including weakness on her legs and poor appetite as much as she stopped smoking and decreased her drinking.  2 weeks ago, she was taken to [facility] with a fall, skeletal survey was negative except L1 fracture with 25% height loss and discharged home on muscle relaxants.  Patient has been intermittently confused since then. In the emergency room, she was found with subacute right ACA stroke, jaundice with bilirubin of 12, bilateral multifocal pneumonia and metabolic encephalopathy.    PT Comments    Pt did very well today - sat EOB and stood with RW.  Pt abel to take shuffling steps to chiar and reach back to sit.  Pt with improved standing control- limited by pain of buttocks (added air cushion to chair).  Pt did some LE exercises. She was cooperative in therapy.  Agree she would be good CIR candidate.  Will continue to follow   Follow Up Recommendations  CIR;Supervision/Assistance - 24 hour     Equipment Recommendations  Other (comment)    Recommendations for Other Services Rehab consult     Precautions / Restrictions Precautions Precautions: Fall Precaution Comments: flexiseal, foley, wound on R foot Restrictions Weight Bearing Restrictions: No    Mobility  Bed Mobility Overal bed mobility: Needs Assistance Bed Mobility: Supine to Sit     Supine to sit: Mod assist;+2 for physical assistance     General bed mobility comments: Pt able to move her legs to EOB and then assisted to raise up her trunk.  pts bottom hurting  throughout this - felt better when in sitting position.  Transfers Overall transfer level: Needs assistance Equipment used: Rolling walker (2 wheeled) Transfers: Sit to/from Stand Sit to Stand: Min assist;From elevated surface;+2 safety/equipment Stand pivot transfers: +2 physical assistance;Min assist       General transfer comment: pt stood from higher bed wtih min assist x 2 and RW.  pt abel to stand up straight - complained of pain in her bottom.  Pt able to turn with RW - safe on her feet and backed up to chair and reached back and sat down. Her bottom - still hurting in sitting (even with air cushion added for comfort).  nursing notified.  Ambulation/Gait             General Gait Details: unable to progress due to pain on her bottom   Stairs             Wheelchair Mobility    Modified Rankin (Stroke Patients Only)       Balance       Sitting balance - Comments: pt did well sitting EOB - no assist needed       Standing balance comment: Pt stood with RW - able to stand without physical assist                            Cognition Arousal/Alertness: Awake/alert Behavior During Therapy: Flat affect Overall Cognitive Status: No family/caregiver present to determine baseline cognitive functioning  General Comments: pt delayed in response but she followed directions and agreed to OOB.      Exercises Total Joint Exercises Long Arc Quad: AROM;10 reps;Seated    General Comments General comments (skin integrity, edema, etc.): Pt foley and flexiseal intact.  Pts right foot bandaged. VSS      Pertinent Vitals/Pain Pain Assessment: Faces Faces Pain Scale: Hurts whole lot Pain Location: Bottom; RN notified Pain Descriptors / Indicators: Aching;Sore Pain Intervention(s): Limited activity within patient's tolerance;Monitored during session;Repositioned    Home Living                       Prior Function            PT Goals (current goals can now be found in the care plan section) Acute Rehab PT Goals Patient Stated Goal: agreeable to OOB with therapy Progress towards PT goals: Progressing toward goals    Frequency    Min 3X/week      PT Plan Current plan remains appropriate    Co-evaluation              AM-PAC PT "6 Clicks" Mobility   Outcome Measure  Help needed turning from your back to your side while in a flat bed without using bedrails?: A Lot Help needed moving from lying on your back to sitting on the side of a flat bed without using bedrails?: A Lot Help needed moving to and from a bed to a chair (including a wheelchair)?: A Lot Help needed standing up from a chair using your arms (e.g., wheelchair or bedside chair)?: A Lot Help needed to walk in hospital room?: Total Help needed climbing 3-5 steps with a railing? : Total 6 Click Score: 10    End of Session Equipment Utilized During Treatment: Gait belt Activity Tolerance: Patient limited by pain Patient left: with call bell/phone within reach;in chair;with chair alarm set Nurse Communication: Mobility status PT Visit Diagnosis: Other abnormalities of gait and mobility (R26.89);Pain;Muscle weakness (generalized) (M62.81);Difficulty in walking, not elsewhere classified (R26.2)     Time: 1202-1232 PT Time Calculation (min) (ACUTE ONLY): 30 min  Charges:  $Therapeutic Activity: 23-37 mins                     08/29/2019   Rande Lawman, PT    Loyal Buba 08/29/2019, 1:11 PM

## 2019-08-29 NOTE — Progress Notes (Signed)
[facility] Gastroenterology Progress Note  Briana Andrews 53 y.o. 22-Sep-1966   Subjective: Chart report of reddish brown stool last night (patient does not recall). Sleeping soundly but arousable. Denies abdominal pain.  Objective: Vital signs: Vitals:   08/29/19 0637 08/29/19 0814  BP: 108/60 (!) 112/55  Pulse:  80  Resp: 20 16  Temp: 98.4 F (36.9 C) 97.6 F (36.4 C)  SpO2: 92% 93%    Physical Exam: Gen: lethargic, no acute distress  HEENT: +icteric sclera CV: RRR Chest: CTA B Abd: soft, nontender, nondistended, +BS Ext: no edema  Lab Results: Recent Labs    08/27/19 0200 08/28/19 0400 08/29/19 0549  NA 135 133* 134*  K 3.3* 3.8 3.8  CL 108 107 108  CO2 22 21* 22  GLUCOSE 112* 92 77  BUN 22* 18 17  CREATININE 0.96 0.85 0.73  CALCIUM 7.0* 6.7* 7.0*  MG 1.9  --   --    Recent Labs    08/28/19 0400 08/29/19 0549  AST 48* 51*  ALT 33 32  ALKPHOS 120 166*  BILITOT 4.5* 3.7*  PROT 4.5* 4.3*  ALBUMIN <1.0* <1.0*   Recent Labs    08/28/19 0400  08/28/19 2214 08/29/19 0549  WBC 10.7*  --   --  9.7  NEUTROABS  --   --   --  7.2  HGB 7.9*   < > 7.5* 7.0*  HCT 22.7*   < > 22.5* 20.3*  MCV 93.0  --   --  95.8  PLT 256  --   --  260   < > = values in this interval not displayed.      Assessment/Plan: S/P duodenal ulcer with adherent clot and pyloric channel ulcer with bleeding - s/p epi injection to both and Bicap to the duodenal ulcer. Hgb drop to 7 and report of rectal bleeding. Continue Protonix drip. Clear liquids. If signs of recurrent bleeding perist, then may need a repeat EGD in the next 1-2 days to re-evaluate the ulcer sites. Dr. Penelope Coop will f/u this weekend.   Briana Andrews 08/29/2019, 8:56 AM  Questions please call [phone] ID: Briana Andrews, female   DOB: 1966/09/05, 53 y.o.   MRN: [id number]

## 2019-08-29 NOTE — Progress Notes (Addendum)
ANTICOAGULATION CONSULT NOTE - Follow Up Consult  Pharmacy Consult for Heparin Indication: DVT, LV thrombus, and acute CVA  Allergies  Allergen Reactions  . Penicillins Other (See Comments)    Unsure of reaction Did it involve swelling of the face/tongue/throat, SOB, or low BP? Unknown Did it involve sudden or severe rash/hives, skin peeling, or any reaction on the inside of your mouth or nose? Unknown Did you need to seek medical attention at a hospital or doctor's office? Unknown When did it last happen?[place] If all above answers are "NO", may proceed with cephalosporin use.    Patient Measurements: Height: 5\' 5"  (165.1 cm) Weight: 189 lb 2.5 oz (85.8 kg) IBW/kg (Calculated) : 57 Heparin Dosing Weight: 75 kg  Vital Signs: Temp: 97.6 F (36.4 C) (10/02 0814) Temp Source: Oral (10/02 0637) BP: 112/55 (10/02 0814) Pulse Rate: 80 (10/02 0814)  Labs: Recent Labs    08/27/19 0200 08/27/19 1123 08/27/19 1645  08/28/19 0400 08/28/19 0429 08/28/19 1646 08/28/19 2214 08/29/19 0549 08/29/19 0550  HGB 7.5*  --  8.5*   < > 7.9*  --  7.5* 7.5* 7.0*  --   HCT 22.8*  --  26.0*   < > 22.7*  --  21.9* 22.5* 20.3*  --   PLT 284  --  261  --  256  --   --   --  260  --   LABPROT  --  21.3*  --   --   --   --   --   --   --   --   INR  --  1.9*  --   --   --   --   --   --   --   --   HEPARINUNFRC 0.21* 0.41  --   --   --  0.42  --   --   --  0.47  CREATININE 0.96  --   --   --  0.85  --   --   --  0.73  --    < > = values in this interval not displayed.    Estimated Creatinine Clearance: 86.9 mL/min (by C-G formula based on SCr of 0.73 mg/dL).   Medications:  Scheduled:  . sodium chloride   Intravenous Once  . sodium chloride   Intravenous Once  . chlorhexidine  15 mL Mouth/Throat BID  . Chlorhexidine Gluconate Cloth  6 each Topical Daily  . digoxin  0.125 mg Oral Daily  . feeding supplement  1 Container Oral TID BM  . folic acid  1 mg Oral Daily  . gabapentin   200 mg Oral BID  . lactulose  30 g Per Tube Q6H  . losartan  12.5 mg Oral Daily  . mouth rinse  15 mL Mouth Rinse BID  . multivitamin with minerals  1 tablet Per Tube Daily  . nicotine  21 mg Transdermal Daily  . pantoprazole  40 mg Intravenous Q12H  . potassium chloride  40 mEq Oral Once  . rifaximin  550 mg Oral BID  . sodium chloride flush  10 mL Intravenous Q12H  . sodium chloride flush  10 mL Intravenous Q12H  . spironolactone  25 mg Oral Daily  . thiamine  100 mg Oral Daily   Infusions:  . sodium chloride Stopped (08/25/19 2202)  . heparin 1,250 Units/hr (08/29/19 0435)  . norepinephrine (LEVOPHED) Adult infusion Stopped (08/27/19 2055)  . pantoprozole (PROTONIX) infusion 8 mg/hr (08/29/19 10/29/19)    Assessment:  [name] on heparin for LV thrombus and bilateral lower extremity ischemia w/ right popliteal vein DVT now s/p angioplasty and R/L popliteal thrombectomy of the tibial artery. Heparin therapy complicated by acute CVA and recent GI bleed and need for lower goal. Plans noted for coumadin when no further procedures   She is s/p mech thrombectomy of L popliteal 9/23. Heparin stopped 09/23 @ 1750 due to a GI bleed. Pt completed course of octreotide. Pt placed on IV PPI for treatment of ulcers. Heparin to continue due to LV thrombus per Dr. Aundra Dubin.  Heparin level is within goal at 0.47 on heparin rate of 1,250 units/hr. No new sources of bleeding noted or issues with infusion per RN. Hgb continues to trend down at 7 this morning.   Received call from cardiology requesting running heparin at low end of goal. Level trending up this morning, will decrease rate and aim for closer to 0.3 as goal.   Goal of Therapy:  Heparin level 0.3-0.5 units/ml>>closer to 0.3 Monitor platelets by anticoagulation protocol: Yes   Plan:  - Reduce heparin to 1150 units/hr - Will be conservative with heparin dosing due to bleeding - Daily heparin level and CBC - Monitor for signs/symptoms of  bleeding  Erin Hearing PharmD., BCPS Clinical Pharmacist 08/29/2019 10:41 AM   Addendum:    Dr. Benny Lennert called and stated that the patient was bleeding from her mouth and she wanted to further decrease the heparin dose.  Plan:  Hold heparin infusion for 1 hour Resume heparin at 1000 units/hr Will recheck HL 6 - 8 hours after the drip is resumed   Alanda Slim, PharmD, [facility] Clinical Pharmacist Please see AMION for all Pharmacists' Contact Phone Numbers 08/29/2019, 4:09 PM

## 2019-08-29 NOTE — Progress Notes (Signed)
PROGRESS NOTE  Briana Andrews POL:[id number] DOB: 1966-11-26 DOA: 08/15/2019 PCP: Patient, [name]  Brief History   53 yo alcoholic woman admitted with R frontal CVA, B popliteal artery occlusions, hepatic failure, GI bleeding. She was found to have new cardiomyopathy with an LV thrombus, cirrhosis, ARF. Underwent B LE thrombectomies, was treated with heparin, stabilized. Has required hemodynamic support w dobutamine. Received PRBC and was able to undergo EGD 9/29 that showed duodenal and gastric ulcers which were treated endoscopically. Remains on dobuta, PPI gtt, heparin.   On 08/29/2019 the patient developed bleeding from multiple sites including her mouth, urine, and rectum. Pharmacy had reduced the dose and goal heparin level earlier in the day. I discussed with pharmacy. The heparin will be held for one hour and then restarted at yet an even lower dose. The patient will be carefully monitored for further bleeding.  Consultants  . Palliative Care . PCCM . Heart Failure Team . Vascular Surgery . Cardiology  Procedures  . EGD/Epinephrine injection . Mechanical Intubation . Central Venous Catheter . Extubation . Arteriogram of right and left lower extremities . Mechanical thrombectomy of the left popliteal and posterior tibial artery using Penumbra CAT 6 device Mechanical thrombectomy of the right popliteal and posterior tibial artery using Penumbra CAT 6 device  Antibiotics   Anti-infectives (From admission, onward)   Start     Dose/Rate Route Frequency Ordered Stop   08/28/19 2200  rifaximin (XIFAXAN) tablet 550 mg     550 mg Oral 2 times daily 08/28/19 0934     08/26/19 1000  rifaximin (XIFAXAN) tablet 550 mg  Status:  Discontinued     550 mg Per Tube 2 times daily 08/25/19 2113 08/28/19 0935   08/20/19 1800  cefTRIAXone (ROCEPHIN) 2 g in sodium chloride 0.9 % 100 mL IVPB  Status:  Discontinued     2 g 200 mL/hr over 30 Minutes Intravenous Every 24 hours 08/20/19 1600  08/23/19 0825   08/18/19 0700  levofloxacin (LEVAQUIN) tablet 750 mg  Status:  Discontinued     750 mg Oral Every 48 hours 08/17/19 1205 08/20/19 1600   08/16/19 1115  rifaximin (XIFAXAN) tablet 550 mg  Status:  Discontinued     550 mg Oral 2 times daily 08/16/19 1114 08/25/19 2113   08/16/19 0700  levofloxacin (LEVAQUIN) IVPB 750 mg  Status:  Discontinued     750 mg 100 mL/hr over 90 Minutes Intravenous Every 48 hours 08/16/19 0616 08/17/19 1205     Subjective  The patient is resting comfortably. No new complaints.   Objective   Vitals:  Vitals:   08/29/19 1454 08/29/19 1611  BP:  98/67  Pulse:  86  Resp:  (!) 21  Temp: 98.5 F (36.9 C) 98.3 F (36.8 C)  SpO2:     Exam:  Constitutional:  . The patient is awake, alert, and oriented x 3. No acute distress. Respiratory:  . No increased work of breathing. . No wheezes, rales, or rhonchi . No tactile fremitus Cardiovascular:  . Regular rate and rhythm . No murmurs, ectopy, or gallups. . No lateral PMI. No thrills. Abdomen:  . Abdomen is soft, non-tender, non-distended . No hernias, masses, or organomegaly . Normoactive bowel sounds.  Musculoskeletal:  . No cyanosis, clubbing, or edema Skin:  . No rashes, lesions, ulcers . palpation of skin: no induration or nodules Neurologic:  . CN 2-12 intact . Sensation all 4 extremities intact Psychiatric:  . Mental status o Mood, affect appropriate o Orientation  to person, place, time  . judgment and insight appear intact  I have personally reviewed the following:   Today's Data  . Vitals, CBC, CMP  Scheduled Meds: . sodium chloride   Intravenous Once  . sodium chloride   Intravenous Once  . chlorhexidine  15 mL Mouth/Throat BID  . Chlorhexidine Gluconate Cloth  6 each Topical Daily  . digoxin  0.125 mg Oral Daily  . feeding supplement  1 Container Oral TID BM  . folic acid  1 mg Oral Daily  . gabapentin  200 mg Oral BID  . lactulose  30 g Per Tube Q6H  .  losartan  12.5 mg Oral BID  . mouth rinse  15 mL Mouth Rinse BID  . multivitamin with minerals  1 tablet Per Tube Daily  . nicotine  21 mg Transdermal Daily  . pantoprazole  40 mg Intravenous Q12H  . rifaximin  550 mg Oral BID  . sodium chloride flush  10 mL Intravenous Q12H  . sodium chloride flush  10 mL Intravenous Q12H  . spironolactone  25 mg Oral Daily  . thiamine  100 mg Oral Daily   Continuous Infusions: . sodium chloride Stopped (08/25/19 2202)  . heparin    . norepinephrine (LEVOPHED) Adult infusion Stopped (08/27/19 2055)  . pantoprozole (PROTONIX) infusion 8 mg/hr (08/29/19 1324)    Principal Problem:   Cerebral embolism with cerebral infarction Active Problems:   Abnormal LFTs   Acute metabolic encephalopathy   SOB (shortness of breath)   AKI (acute kidney injury) ([place])   Fall   Lactic acidosis   Back pain   Liver failure without hepatic coma (HCC)   Multifocal pneumonia   PVD (peripheral vascular disease) (HCC)   Left ventricular apical thrombus without MI ([place])   Acute CHF (congestive heart failure) ([place])   Jaundice   Shock circulatory ([place])   Palliative care by specialist   Goals of care, counseling/discussion   Advanced care planning/counseling discussion   Acute upper GI bleed   Pressure injury of skin   GI bleed   LOS: 13 days   A & P  Biventricular failure some combination ischemic event vs stress-induced (from shock state, acidemia, ABLA, ?aspiration-induced sepsis). Dobutamine weaned to off.  The patient was successfully extubated after EGD yesterday and respiratory status is stable on 4L [place]. Eventually she will need LHC to evaluate for biventricular failure.  Hemorrhage from mouth, urine, and rectum per nursing: Pharmacy had reduced the dose and goal heparin level earlier in the day. I discussed with pharmacy. The heparin will be held for one hour and then restarted at yet an even lower dose. The patient will be carefully monitored for further  bleeding.  LV clot on AC:  ASA 81 mg daily. Continue heparin drip. Monitor CBC.  GI Bleed: The patient presented with melena with suspicion for upper GI source, intermittent transfusion requirements. EGD demonstrated oozing 73mm pre-pyloric ulcer. This was injected with Epi. Hgb 7.5 overnight and received 1 unit PRBC's. Hemoglobin is stable. She is tolerating Po fluids well.  Alcoholism, hyperbilirubinemia, clinically she has cirrhosis although imaging features not c/w this; LFT pattern not c/w alcoholic hepatitis nor ischemic nor congestive. We will continue lactulose, rifaximin, thiamine and folate.  Encephalopathy: Combination from critical illness, hepatic, and CVA, still has asterixis, improving overall. Continue lactulose.  AKI: Improving related to initial shock, blood loss.  CVA: likely secondary to emboli from LV clot.  BL femoral artery occlusion s/p BL thrombectomy:  RLE  does not look great but dopplerable pulses, vascular surgery aware, poor candidate for further procedures, monitor clinically.  Severe protein calorie malnutrition due to alcoholism: Nutrition Consult.  Rt sided pleural effusion without resp distress: likely due to hepatic hydrothorax vs. distrubutive from poor oncotic pressure. No indication to drain.  I have seen and examined this patient myself. I have spent 45 minutes in her evaluation and care.  Briana Lupe, DO [facility] Direct contact: see [email or web address]  7PM-7AM contact night coverage as above 08/29/2019, 4:33 PM  LOS: 12 days

## 2019-08-29 NOTE — Progress Notes (Addendum)
Patient ID: Briana Andrews, female   DOB: 13-Mar-1966, 53 y.o.   MRN: [id number]     Advanced Heart Failure Rounding Note  PCP-Cardiologist: Tobias Alexander, MD   Subjective:    Yesterday dobutamine was stopped. CO-OX 63%. CVP 1-2   Hgb back down to 7. Ongoing melena.   Heparin gtt ongoing.   EGD (9/29): Bleeding gastric and duodenal ulcers treated in endoscopy suite with resolution of bleeding. No varices.   Denies SOB. Denies abdominal pain.    Objective:   Weight Range: 85.8 kg Body mass index is 31.48 kg/m.   Vital Signs:   Temp:  [97.9 F (36.6 C)-98.7 F (37.1 C)] 98.4 F (36.9 C) (10/02 0637) Pulse Rate:  [75-82] 80 (10/01 2359) Resp:  [19-25] 20 (10/02 0637) BP: (93-108)/(54-62) 108/60 (10/02 0637) SpO2:  [92 %-99 %] 92 % (10/02 0637) Weight:  [85.8 kg] 85.8 kg (10/02 0500) Last BM Date: 08/28/19  Weight change: Filed Weights   08/27/19 0500 08/28/19 0445 08/29/19 0500  Weight: 88.4 kg 86.2 kg 85.8 kg    Intake/Output:   Intake/Output Summary (Last 24 hours) at 08/29/2019 0742 Last data filed at 08/29/2019 0642 Gross per 24 hour  Intake 898.92 ml  Output 1500 ml  Net -601.08 ml      Physical Exam   CVP 1-2  General:   No resp difficulty HEENT: normal Neck: supple. no JVD. Carotids 2+ bilat; no bruits. No lymphadenopathy or thryomegaly appreciated. Cor: PMI nondisplaced. Regular rate & rhythm. No rubs, gallops or murmurs. Lungs: clear Abdomen: soft, nontender, nondistended. No hepatosplenomegaly. No bruits or masses. Good bowel sounds. Flexi Seal with black stool.  Extremities: no cyanosis, clubbing, rash, edema.LLE dressing in place.  L Foot ischemic toes.  RUE PICC Neuro: alert & orientedx3, cranial nerves grossly intact. moves all 4 extremities w/o difficulty. Affect pleasant   Telemetry  NSR 70s personally checked.   Labs    CBC Recent Labs    08/28/19 0400  08/28/19 2214 08/29/19 0549  WBC 10.7*  --   --  9.7  NEUTROABS  --   --    --  7.2  HGB 7.9*   < > 7.5* 7.0*  HCT 22.7*   < > 22.5* 20.3*  MCV 93.0  --   --  95.8  PLT 256  --   --  260   < > = values in this interval not displayed.   Basic Metabolic Panel Recent Labs    26/37/85 0200 08/28/19 0400 08/29/19 0549  NA 135 133* 134*  K 3.3* 3.8 3.8  CL 108 107 108  CO2 22 21* 22  GLUCOSE 112* 92 77  BUN 22* 18 17  CREATININE 0.96 0.85 0.73  CALCIUM 7.0* 6.7* 7.0*  MG 1.9  --   --    Liver Function Tests Recent Labs    08/28/19 0400 08/29/19 0549  AST 48* 51*  ALT 33 32  ALKPHOS 120 166*  BILITOT 4.5* 3.7*  PROT 4.5* 4.3*  ALBUMIN <1.0* <1.0*   No results for input(s): LIPASE, AMYLASE in the last 72 hours. Cardiac Enzymes No results for input(s): CKTOTAL, CKMB, CKMBINDEX, TROPONINI in the last 72 hours.  BNP: BNP (last 3 results) No results for input(s): BNP in the last 8760 hours.  ProBNP (last 3 results) No results for input(s): PROBNP in the last 8760 hours.   D-Dimer No results for input(s): DDIMER in the last 72 hours. Hemoglobin A1C No results for input(s): HGBA1C in the last  72 hours. Fasting Lipid Panel No results for input(s): CHOL, HDL, LDLCALC, TRIG, CHOLHDL, LDLDIRECT in the last 72 hours. Thyroid Function Tests No results for input(s): TSH, T4TOTAL, T3FREE, THYROIDAB in the last 72 hours.  Invalid input(s): FREET3  Other results:   Imaging    No results found.   Medications:     Scheduled Medications: . sodium chloride   Intravenous Once  . chlorhexidine  15 mL Mouth/Throat BID  . Chlorhexidine Gluconate Cloth  6 each Topical Daily  . digoxin  0.125 mg Oral Daily  . feeding supplement  1 Container Oral TID BM  . folic acid  1 mg Oral Daily  . gabapentin  200 mg Oral BID  . lactulose  30 g Per Tube Q6H  . losartan  12.5 mg Oral Daily  . mouth rinse  15 mL Mouth Rinse BID  . multivitamin with minerals  1 tablet Per Tube Daily  . nicotine  21 mg Transdermal Daily  . pantoprazole  40 mg Intravenous Q12H   . potassium chloride  40 mEq Oral Once  . rifaximin  550 mg Oral BID  . sodium chloride flush  10 mL Intravenous Q12H  . sodium chloride flush  10 mL Intravenous Q12H  . spironolactone  25 mg Oral Daily  . thiamine  100 mg Oral Daily    Infusions: . sodium chloride Stopped (08/25/19 2202)  . heparin 1,250 Units/hr (08/29/19 0435)  . norepinephrine (LEVOPHED) Adult infusion Stopped (08/27/19 2055)  . pantoprozole (PROTONIX) infusion 8 mg/hr (08/29/19 9924)  . propofol (DIPRIVAN) infusion Stopped (08/26/19 0930)    PRN Medications: sodium chloride, acetaminophen, albuterol, dextromethorphan-guaiFENesin, fentaNYL (SUBLIMAZE) injection, hydrALAZINE, labetalol, ondansetron (ZOFRAN) IV, oxyCODONE, sodium chloride flush, white petrolatum   Assessment/Plan   1. Acute on chronic systolic CHF/cardiogenic shock: Echo with EF 15-20%, mild LV dilation, LV thrombus, mild-moderate MR, severely decreased RV systolic function. Cause of cardiomyopathy is uncertain, but she was a heavy smoker and ECG is suggestive of possible old ASMI. Ischemic cardiomyopathy is possible, alternatively could be due to ETOH abuse (>6 pack/day per husband) or stress cardiomyopathy with critical illness.  Lactate was elevated at admission.  Suspect cardiogenic shock with low BP, cannot rule out component of septic shock. Currently on dobutamine 1.5 with stable co-ox (48% pre-dobutamine, 65% today) and stable MAP.  CVP 1-2 - CO-OX stable off dobutamine.  - No Lasix today.  - Continue spironolactone to 25 mg daily and losartan 12.5 daily.   - Continue digoxin 0.125.  - Renal function stable.  - Not candidate for advanced therapies - Eventual diagnostic heart cath when bleeding settles down. 2. 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 Markedly elevated bilirubin initially with jaundice.  However liver imaging with [place] and CT has NOT shown cirrhosis or portal/hepatic vein thrombosis. Question has arisen whether  this might possibly be due to primarily to RV failure.  Time course seems quite acute for RV failure.  Jaundice/icterus improving.  - holding stain and other hepato-toxic agents  3. Encephalopathy: ?Cause.  NH3 has not been markedly high but certainly could be hepatic encephalopathy.  Shock likely played a role.  Oriented today.   - Continue lactulose/rifaxamin per GI.  4. AKI: Creatinine improved with inotropic support.  Suspect cardiorenal syndrome, also had contrast with LE angiograms so possible component of contrast nephropathy.  Creatinine normalized.  5. LV thrombus: Possible cause of embolic event to popliteal arteries as well as subacute CVA.  Heparin gtt held with active GI  bleeding initially, then restarted 9/24. INR up to 4.0 with poor nutrition, down to 1.9 after 1 mg po vitamin K earlier in stay. - With embolic events recently, will continue heparin gtt at low therapeutic range despite GI bleeding.    6. PAD: Bilateral popliteal artery occlusions, suspect cardioembolic from LV thrombus.  Now s/p mechanical thrombectomy by vascular. Right foot still looks ischemic but dopplerable pulses.  7. 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  Treatment will be anticoagulation.  8. Right popliteal vein DVT: Anticoagulated.  9. GI bleeding: Upper GI bleeding, EGD with bleeding duodenal and gastric ulcer on 9/29 that were treated, no varices.  Ongoing melena in rectal bag. GI thinks just residual old blood and not active bleeding.  She had 1 unit PRBCs on 9/30.   - Hgb 7. Needs another unit of blood. Give 1UPRBCs.  - Off octreotide, continue Protonix per GI (currently gtt).   - No transfusion for now.   10. ETOH abuse: Per husband, at least a 6 pack daily. Plans to quit.  11. ID: Concern for aspiration PNA. - Completed course of ceftriaxone.  12. Deconditioning: Out of bed to chair, work with PT.    Length of Stay: 13  Amy Filbert Schilder, NP  08/29/2019, 7:42 AM  Advanced Heart  Failure Team Pager [phone] (M-F; 7a - 4p)  Please contact [facility] for night-coverage after hours (4p -7a ) and weekends on [email or web address]  Patient seen with NP, agree with the above note.   She continues to have melena, hgb 7.  She remains on Protonix gtt. Heparin has been running still because of LV thrombus with recent CVA and bilateral leg cardio-embolism.    She is awake/alert, no complaints.   General: NAD Neck: No JVD, no thyromegaly or thyroid nodule.  Lungs: Clear to auscultation bilaterally with normal respiratory effort. CV: Nondisplaced PMI.  Heart regular S1/S2, no S3/S4, no murmur.  No peripheral edema.   Abdomen: Soft, nontender, no hepatosplenomegaly, no distention.  Skin: Intact without lesions or rashes.  Neurologic: Alert and oriented x 3.  Psych: Normal affect. Extremities: Right foot with ischemic changes.  HEENT: Normal.   Still with melena.  GI has seen, plan for repeat EGD in the next couple days if no improvement.   - Will transfuse 1 unit PRBCs today.  - Run heparin at low therapeutic range.  Very difficult situation as her risk for embolism off anticoagulation is high (had recent CVA and bilateral leg emboli).    Co-ox stable off dobutamine.  Continue spironolactone and digoxin, can increase losartan to 12.5 mg bid.  CVP low, no Lasix.  Eventual diagnostic R/LHC if bleeding settles down.   Mental status much improved.  Will need ongoing work with PT, likely will need CIR.   Marca Ancona 08/29/2019 11:17 AM

## 2019-08-30 LAB — CBC WITH DIFFERENTIAL/PLATELET
Abs Immature Granulocytes: 0.12 10*3/uL — ABNORMAL HIGH (ref 0.00–0.07)
Basophils Absolute: 0 10*3/uL (ref 0.0–0.1)
Basophils Relative: 1 %
Eosinophils Absolute: 0.1 10*3/uL (ref 0.0–0.5)
Eosinophils Relative: 1 %
HCT: 32.1 % — ABNORMAL LOW (ref 36.0–46.0)
Hemoglobin: 11 g/dL — ABNORMAL LOW (ref 12.0–15.0)
Immature Granulocytes: 1 %
Lymphocytes Relative: 17 %
Lymphs Abs: 1.5 10*3/uL (ref 0.7–4.0)
MCH: 31.9 pg (ref 26.0–34.0)
MCHC: 34.3 g/dL (ref 30.0–36.0)
MCV: 93 fL (ref 80.0–100.0)
Monocytes Absolute: 0.3 10*3/uL (ref 0.1–1.0)
Monocytes Relative: 3 %
Neutro Abs: 6.7 10*3/uL (ref 1.7–7.7)
Neutrophils Relative %: 77 %
Platelets: 216 10*3/uL (ref 150–400)
RBC: 3.45 MIL/uL — ABNORMAL LOW (ref 3.87–5.11)
RDW: 17.5 % — ABNORMAL HIGH (ref 11.5–15.5)
WBC: 8.7 10*3/uL (ref 4.0–10.5)
nRBC: 0 % (ref 0.0–0.2)

## 2019-08-30 LAB — HEPARIN LEVEL (UNFRACTIONATED)
Heparin Unfractionated: <0.1 IU/mL — ABNORMAL LOW (ref 0.30–0.70)
Heparin Unfractionated: 0.19 IU/mL — ABNORMAL LOW (ref 0.30–0.70)
Heparin Unfractionated: 0.14 IU/mL — ABNORMAL LOW (ref 0.30–0.70)

## 2019-08-30 LAB — COMPREHENSIVE METABOLIC PANEL
ALT: 34 U/L (ref 0–44)
AST: 64 U/L — ABNORMAL HIGH (ref 15–41)
Albumin: 1.1 g/dL — ABNORMAL LOW (ref 3.5–5.0)
Alkaline Phosphatase: 161 U/L — ABNORMAL HIGH (ref 38–126)
Anion gap: 5 (ref 5–15)
BUN: 18 mg/dL (ref 6–20)
CO2: 22 mmol/L (ref 22–32)
Calcium: 7.2 mg/dL — ABNORMAL LOW (ref 8.9–10.3)
Chloride: 107 mmol/L (ref 98–111)
Creatinine, Ser: 0.8 mg/dL (ref 0.44–1.00)
GFR calc Af Amer: >60 mL/min (ref >60)
GFR calc non Af Amer: >60 mL/min (ref >60)
Glucose, Bld: 72 mg/dL (ref 70–99)
Potassium: 4.8 mmol/L (ref 3.5–5.1)
Sodium: 134 mmol/L — ABNORMAL LOW (ref 135–145)
Total Bilirubin: 3.8 mg/dL — ABNORMAL HIGH (ref 0.3–1.2)
Total Protein: 4.9 g/dL — ABNORMAL LOW (ref 6.5–8.1)

## 2019-08-30 LAB — COOXEMETRY PANEL
Carboxyhemoglobin: 1.9 % — ABNORMAL HIGH (ref 0.5–1.5)
Methemoglobin: 1.2 % (ref 0.0–1.5)
O2 Saturation: 54.6 %
Total hemoglobin: 10 g/dL — ABNORMAL LOW (ref 12.0–16.0)

## 2019-08-30 MED ORDER — FUROSEMIDE 40 MG PO TABS
40.0000 mg | ORAL_TABLET | Freq: Every day | ORAL | Status: DC
  Administered 2019-08-31 – 2019-09-13 (×12): 40 mg via ORAL
  Filled 2019-08-30 (×14): qty 1

## 2019-08-30 MED ORDER — FUROSEMIDE 10 MG/ML IJ SOLN
40.0000 mg | Freq: Once | INTRAMUSCULAR | Status: AC
  Administered 2019-08-30: 14:00:00 40 mg via INTRAVENOUS
  Filled 2019-08-30: qty 4

## 2019-08-30 MED ORDER — LOSARTAN POTASSIUM 25 MG PO TABS
25.0000 mg | ORAL_TABLET | Freq: Two times a day (BID) | ORAL | Status: DC
  Administered 2019-08-30 – 2019-09-01 (×3): 25 mg via ORAL
  Filled 2019-08-30 (×5): qty 1

## 2019-08-30 NOTE — Plan of Care (Signed)
  Problem: Health Behavior/Discharge Planning: Goal: Ability to manage health-related needs will improve Outcome: Not Progressing   

## 2019-08-30 NOTE — Progress Notes (Signed)
PROGRESS NOTE  Briana Andrews SEG:[id number] DOB: September 08, 1966 DOA: 08/15/2019 PCP: Patient, [name]  Brief History   53 yo alcoholic woman admitted with R frontal CVA, B popliteal artery occlusions, hepatic failure, GI bleeding. She was found to have new cardiomyopathy with an LV thrombus, cirrhosis, ARF. Underwent B LE thrombectomies, was treated with heparin, stabilized. Has required hemodynamic support w dobutamine. Received PRBC and was able to undergo EGD 9/29 that showed duodenal and gastric ulcers which were treated endoscopically. Remains on dobuta, PPI gtt, heparin.   On 08/29/2019 the patient developed bleeding from multiple sites including her mouth, urine, and rectum. Pharmacy had reduced the dose and goal heparin level earlier in the day. I discussed with pharmacy. The heparin will be held for one hour and then restarted at yet an even lower dose. The patient will be carefully monitored for further bleeding.  Consultants   Palliative Care  PCCM  Heart Failure Team  Vascular Surgery  Cardiology  Procedures   EGD/Epinephrine injection  Mechanical Intubation  Central Venous Catheter  Extubation  Arteriogram of right and left lower extremities  Mechanical thrombectomy of the left popliteal and posterior tibial artery using Penumbra CAT 6 device Mechanical thrombectomy of the right popliteal and posterior tibial artery using Penumbra CAT 6 device  Antibiotics   Anti-infectives (From admission, onward)   Start     Dose/Rate Route Frequency Ordered Stop   08/28/19 2200  rifaximin (XIFAXAN) tablet 550 mg     550 mg Oral 2 times daily 08/28/19 0934     08/26/19 1000  rifaximin (XIFAXAN) tablet 550 mg  Status:  Discontinued     550 mg Per Tube 2 times daily 08/25/19 2113 08/28/19 0935   08/20/19 1800  cefTRIAXone (ROCEPHIN) 2 g in sodium chloride 0.9 % 100 mL IVPB  Status:  Discontinued     2 g 200 mL/hr over 30 Minutes Intravenous Every 24 hours 08/20/19 1600  08/23/19 0825   08/18/19 0700  levofloxacin (LEVAQUIN) tablet 750 mg  Status:  Discontinued     750 mg Oral Every 48 hours 08/17/19 1205 08/20/19 1600   08/16/19 1115  rifaximin (XIFAXAN) tablet 550 mg  Status:  Discontinued     550 mg Oral 2 times daily 08/16/19 1114 08/25/19 2113   08/16/19 0700  levofloxacin (LEVAQUIN) IVPB 750 mg  Status:  Discontinued     750 mg 100 mL/hr over 90 Minutes Intravenous Every 48 hours 08/16/19 0616 08/17/19 1205     Subjective  The patient is resting comfortably. No new complaints. She states that "all of her days are hard".  Objective   Vitals:  Vitals:   08/30/19 0902 08/30/19 1208  BP: 110/73 114/72  Pulse: 78 81  Resp: (!) 22 18  Temp: 97.8 F (36.6 C)   SpO2: 97% 98%   Exam:  Constitutional:   The patient is awake, alert, and oriented x 3. No acute distress. Respiratory:   No increased work of breathing.  No wheezes, rales, or rhonchi  No tactile fremitus Cardiovascular:   Regular rate and rhythm  No murmurs, ectopy, or gallups.  No lateral PMI. No thrills. Abdomen:   Abdomen is soft, non-tender, non-distended  No hernias, masses, or organomegaly  Normoactive bowel sounds.  Musculoskeletal:   No cyanosis, clubbing, or edema Skin:   No rashes, lesions, ulcers  palpation of skin: no induration or nodules Neurologic:   CN 2-12 intact  Sensation all 4 extremities intact Psychiatric:   Mental status o  Mood: Depressed, affect flat o Orientation to person, place, time   judgment and insight appear intact  I have personally reviewed the following:   Today's Data   Vitals, CBC, CMP  Scheduled Meds:  sodium chloride   Intravenous Once   sodium chloride   Intravenous Once   chlorhexidine  15 mL Mouth/Throat BID   Chlorhexidine Gluconate Cloth  6 each Topical Daily   digoxin  0.125 mg Oral Daily   feeding supplement  1 Container Oral TID BM   folic acid  1 mg Oral Daily   [START ON 08/31/2019]  furosemide  40 mg Oral Daily   gabapentin  200 mg Oral BID   lactulose  30 g Per Tube Q6H   losartan  25 mg Oral BID   mouth rinse  15 mL Mouth Rinse BID   multivitamin with minerals  1 tablet Per Tube Daily   nicotine  21 mg Transdermal Daily   pantoprazole  40 mg Intravenous Q12H   rifaximin  550 mg Oral BID   sodium chloride flush  10 mL Intravenous Q12H   sodium chloride flush  10 mL Intravenous Q12H   spironolactone  25 mg Oral Daily   thiamine  100 mg Oral Daily   Continuous Infusions:  sodium chloride Stopped (08/25/19 2202)   heparin 1,000 Units/hr (08/30/19 1222)   norepinephrine (LEVOPHED) Adult infusion Stopped (08/27/19 2055)    Principal Problem:   Cerebral embolism with cerebral infarction Active Problems:   Abnormal LFTs   Acute metabolic encephalopathy   SOB (shortness of breath)   AKI (acute kidney injury) (HCC)   Fall   Lactic acidosis   Back pain   Liver failure without hepatic coma (HCC)   Multifocal pneumonia   PVD (peripheral vascular disease) (HCC)   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 Dobutamine weaned to off.  The patient was successfully extubated after EGD yesterday and respiratory status is stable on 4L [place]. Eventually she will need LHC to evaluate for biventricular failure.  Hemorrhage from mouth, urine, and rectum per nursing: Pharmacy had reduced the dose and goal heparin level earlier in the day. I discussed with pharmacy. The heparin will be held for one hour and then restarted at yet an even  lower dose. The patient will be carefully monitored for further bleeding. This appeared to have stabilized this morning.  LV clot on AC:  ASA 81 mg daily. Continue heparin drip. Monitor CBC.  GI Bleed: The patient presented with melena with suspicion for upper GI source, intermittent transfusion requirements. EGD demonstrated oozing 52mm pre-pyloric ulcer. This was injected with Epi. Hgb 7.5 overnight and received 1 unit PRBC's. Hemoglobin dropped to 7 again on 08/29/2019 after observation of bleeding in mouth, in urine, and in stool. Heparin is held for one hour and dose reduced for the second time. The patient was transfused with 2 units PRBC's. Bleeding stopped overnight. Hemoglobin now stable. Monitor.  Alcoholism, hyperbilirubinemia, clinically she has cirrhosis although imaging features not c/w this; LFT pattern not c/w alcoholic hepatitis nor ischemic  nor congestive. We will continue lactulose, rifaximin, thiamine and folate.  Encephalopathy: Combination from critical illness, hepatic, and CVA, still has asterixis, improving overall. Continue lactulose.  AKI: Improving related to initial shock, blood loss.  CVA: likely secondary to emboli from LV clot.  BL femoral artery occlusion s/p BL thrombectomy:  RLE does not look great but dopplerable pulses, vascular surgery aware, poor candidate for further procedures, monitor clinically.  Severe protein calorie malnutrition due to alcoholism: Nutrition Consult.  Rt sided pleural effusion without resp distress: likely due to hepatic hydrothorax vs. distrubutive from poor oncotic pressure. No indication to drain.  I have seen and examined this patient myself. I have spent 32 minutes in her evaluation and care.  Casen Pryor, DO [facility] Direct contact: see [email or web address]  7PM-7AM contact night coverage as above 08/30/2019, 1:43 PM  LOS: 12 days

## 2019-08-30 NOTE — Progress Notes (Signed)
ANTICOAGULATION CONSULT NOTE - Follow Up Consult  Pharmacy Consult for Heparin Indication: DVT, LV thrombus, and acute CVA  Allergies  Allergen Reactions  . Penicillins Other (See Comments)    Unsure of reaction Did it involve swelling of the face/tongue/throat, SOB, or low BP? Unknown Did it involve sudden or severe rash/hives, skin peeling, or any reaction on the inside of your mouth or nose? Unknown Did you need to seek medical attention at a hospital or doctor's office? Unknown When did it last happen?[place] If all above answers are "NO", may proceed with cephalosporin use.    Patient Measurements: Height: 5\' 5"  (165.1 cm) Weight: 189 lb 2.5 oz (85.8 kg) IBW/kg (Calculated) : 57 Heparin Dosing Weight: 75 kg  Vital Signs: Temp: 97.5 F (36.4 C) (10/03 0431) Temp Source: Oral (10/03 0431) BP: 123/84 (10/03 0431) Pulse Rate: 89 (10/03 0431)  Labs: Recent Labs    08/27/19 1123 08/27/19 1645  08/28/19 0400 08/28/19 0429  08/28/19 2214 08/29/19 0549 08/29/19 0550 08/29/19 2100 08/30/19 0410  HGB  --  8.5*   < > 7.9*  --    < > 7.5* 7.0*  --  8.7*  --   HCT  --  26.0*   < > 22.7*  --    < > 22.5* 20.3*  --  26.0*  --   PLT  --  261  --  256  --   --   --  260  --   --   --   LABPROT 21.3*  --   --   --   --   --   --   --   --  20.8*  --   INR 1.9*  --   --   --   --   --   --   --   --  1.8*  --   HEPARINUNFRC 0.41  --   --   --  0.42  --   --   --  0.47  --  <0.10*  CREATININE  --   --   --  0.85  --   --   --  0.73  --   --  0.80   < > = values in this interval not displayed.    Estimated Creatinine Clearance: 86.9 mL/min (by C-G formula based on SCr of 0.8 mg/dL).   Medications:  Scheduled:  . sodium chloride   Intravenous Once  . sodium chloride   Intravenous Once  . chlorhexidine  15 mL Mouth/Throat BID  . Chlorhexidine Gluconate Cloth  6 each Topical Daily  . digoxin  0.125 mg Oral Daily  . feeding supplement  1 Container Oral TID BM  . folic  acid  1 mg Oral Daily  . gabapentin  200 mg Oral BID  . lactulose  30 g Per Tube Q6H  . losartan  12.5 mg Oral BID  . mouth rinse  15 mL Mouth Rinse BID  . multivitamin with minerals  1 tablet Per Tube Daily  . nicotine  21 mg Transdermal Daily  . pantoprazole  40 mg Intravenous Q12H  . rifaximin  550 mg Oral BID  . sodium chloride flush  10 mL Intravenous Q12H  . sodium chloride flush  10 mL Intravenous Q12H  . spironolactone  25 mg Oral Daily  . thiamine  100 mg Oral Daily   Infusions:  . sodium chloride Stopped (08/25/19 2202)  . heparin 1,000 Units/hr (08/29/19 1733)  . norepinephrine (LEVOPHED) Adult  infusion Stopped (08/27/19 2055)    Assessment: [name] on heparin for LV thrombus and bilateral lower extremity ischemia w/ right popliteal vein DVT now s/p angioplasty and R/L popliteal thrombectomy of the tibial artery. Heparin therapy complicated by acute CVA and recent GI bleed and need for lower goal. Plans noted for coumadin when no further procedures   She is s/p mech thrombectomy of L popliteal 9/23. Heparin stopped 09/23 @ 1750 due to a GI bleed. Pt completed course of octreotide. Pt placed on IV PPI for treatment of ulcers. Heparin to continue due to LV thrombus per Dr. Shirlee Latch.  Had bleeding yesterday so heparin gtt was reduced further - today bleeding resolved/stabilized. Heparin level came back undetectable- was drawn between blood transfusions (likely incorrect). Hgb 8.7, plt 260 - stable.   Goal of Therapy:  Heparin level 0.3-0.5 units/ml>>closer to 0.3 Monitor platelets by anticoagulation protocol: Yes   Plan:  - Continue heparin gtt at 1000 units/hr - plan to recheck heparin level after transfusion complete and adjust accordingly  - Will be conservative with heparin dosing due to bleeding - Daily heparin level and CBC - Monitor for signs/symptoms of bleeding  Sherron Monday, PharmD, BCCCP Clinical Pharmacist   Please check AMION for all [facility] Pharmacy phone  numbers After 10:00 PM, call Main Pharmacy [phone] 08/30/2019 5:33 AM

## 2019-08-30 NOTE — Progress Notes (Signed)
Daily Progress Note   Patient Name: Briana Andrews       Date: 08/30/2019 DOB: Dec 01, 1965  Age: 53 y.o. MRN#: [id number] Attending Physician: Karie Kirks, DO Primary Care Physician: Patient, [name] Admit Date: 08/15/2019  Reason for Consultation/Follow-up: Establishing goals of care  Subjective: Patient awake. Able to say her name, and that she's in the hospital. She tells me she had a stroke. Denies pain. She stares off when asked other questions or tried to engage in conversation. Then apologizes for "zoning out". Breakfast tray is on sink- untouch. Lunch tray is in front of her - she has eaten only half of her clear broth.   I called and talked with Tim. Answered his questions about her current status. He is happy to hear that she worked well with PT. He is hopeful she will be able to have heart cath and this will improve her heart function. He is hoping she will be able to go to CIR and regain some function before coming home.    ROS  Length of Stay: 14  Current Medications: Scheduled Meds:  . sodium chloride   Intravenous Once  . sodium chloride   Intravenous Once  . chlorhexidine  15 mL Mouth/Throat BID  . Chlorhexidine Gluconate Cloth  6 each Topical Daily  . digoxin  0.125 mg Oral Daily  . feeding supplement  1 Container Oral TID BM  . folic acid  1 mg Oral Daily  . furosemide  40 mg Intravenous Once  . [START ON 08/31/2019] furosemide  40 mg Oral Daily  . gabapentin  200 mg Oral BID  . lactulose  30 g Per Tube Q6H  . losartan  25 mg Oral BID  . mouth rinse  15 mL Mouth Rinse BID  . multivitamin with minerals  1 tablet Per Tube Daily  . nicotine  21 mg Transdermal Daily  . pantoprazole  40 mg Intravenous Q12H  . rifaximin  550 mg Oral BID  . sodium chloride flush  10  mL Intravenous Q12H  . sodium chloride flush  10 mL Intravenous Q12H  . spironolactone  25 mg Oral Daily  . thiamine  100 mg Oral Daily    Continuous Infusions: . sodium chloride Stopped (08/25/19 2202)  . heparin 1,000 Units/hr (08/30/19 1222)  . norepinephrine (LEVOPHED) Adult  infusion Stopped (08/27/19 2055)    PRN Meds: sodium chloride, acetaminophen, albuterol, dextromethorphan-guaiFENesin, fentaNYL (SUBLIMAZE) injection, hydrALAZINE, labetalol, ondansetron (ZOFRAN) IV, oxyCODONE, sodium chloride flush, white petrolatum  Physical Exam Vitals signs and nursing note reviewed.  Pulmonary:     Effort: Pulmonary effort is normal.  Skin:    Coloration: Skin is jaundiced.  Neurological:     Mental Status: She is alert.     Motor: Weakness present.     Comments: Slowed cognitive processing             Vital Signs: BP 114/72 (BP Location: Left Arm)   Pulse 81   Temp 97.8 F (36.6 C) (Oral)   Resp 18   Ht 5\' 5"  (1.651 m)   Wt 88.3 kg   SpO2 98%   BMI 32.39 kg/m  SpO2: SpO2: 98 % O2 Device: O2 Device: Nasal Cannula O2 Flow Rate: O2 Flow Rate (L/min): 2 L/min  Intake/output summary:   Intake/Output Summary (Last 24 hours) at 08/30/2019 1303 Last data filed at 08/30/2019 0131 Gross per 24 hour  Intake 1136.34 ml  Output 550 ml  Net 586.34 ml   LBM: Last BM Date: 08/30/19 Baseline Weight: Weight: 81.6 kg Most recent weight: Weight: 88.3 kg       Palliative Assessment/Data: PPS: 20%   Flowsheet Rows     Most Recent Value  Intake Tab  Referral Department  Hospitalist  Unit at Time of Referral  Cardiac/Telemetry Unit  Palliative Care Primary Diagnosis  Cardiac  Date Notified  08/20/19  Palliative Care Type  New Palliative care  Reason for referral  Clarify Goals of Care  Date of Admission  08/15/19  Date first seen by Palliative Care  08/20/19  # of days Palliative referral response time  0 Day(s)  # of days IP prior to Palliative referral  5  Clinical  Assessment  Psychosocial & Spiritual Assessment  Palliative Care Outcomes      Patient Active Problem List   Diagnosis Date Noted  . GI bleed 08/26/2019  . Pressure injury of skin 08/23/2019  . Acute upper GI bleed   . Shock circulatory (HCC)   . Palliative care by specialist   . Goals of care, counseling/discussion   . Advanced care planning/counseling discussion   . Jaundice   . Cerebral embolism with cerebral infarction 08/17/2019  . PVD (peripheral vascular disease) (HCC) 08/17/2019  . 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 Acute CHF (congestive heart failure) (HCC) 08/17/2019  . Abnormal LFTs 08/16/2019  . Acute metabolic encephalopathy 08/16/2019  . SOB (shortness of breath) 08/16/2019  . AKI (acute kidney injury) (HCC) 08/16/2019  . Fall 08/16/2019  . Lactic acidosis 08/16/2019  . Back pain 08/16/2019  . Liver failure without hepatic coma (HCC) 08/16/2019  . Multifocal pneumonia 08/16/2019  . Alcohol abuse   . Tobacco abuse   . Shortness of breath     Palliative Care Assessment & Plan   Patient Profile: 53 y.o. female  with past medical history of  admitted on 08/15/2019 with weakness and jaundice. 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 She has progressed during admission to cardiogenic shock and AKI. Her albumin is significantly decreased at 1.2. She remains severely encephalopathic and hypotensive. Palliative medicine consulted for GOC. She has been made DNR by critical care.    Assessment/Recommendations/Plan  GOC are set- continue current care  Pain is well managed at this time  PMT will shadow and intervene if patient declines  Goals of Care and Additional Recommendations:  Limitations on Scope of Treatment: Full Scope Treatment  Code Status:   DNR  Prognosis:   Unable to determine  Discharge Planning:  To Be Determined  Care plan was discussed with patient's spouse.  Thank you for allowing the Palliative Medicine Team to assist in the care of this patient.   Time In: 1240 Time Out: 1315 Total Time 35 mins Prolonged Time Billed no      Greater than 50%  of this time was spent counseling and coordinating care related to the above assessment and plan.  Ocie Bob, AGNP-C Palliative Medicine   Please contact Palliative Medicine Team phone at [phone] for questions and concerns.

## 2019-08-30 NOTE — Progress Notes (Signed)
ANTICOAGULATION CONSULT NOTE - Follow Up Consult  Pharmacy Consult for Heparin Indication: DVT, LV thrombus, and acute CVA  Allergies  Allergen Reactions  . Penicillins Other (See Comments)    Unsure of reaction Did it involve swelling of the face/tongue/throat, SOB, or low BP? Unknown Did it involve sudden or severe rash/hives, skin peeling, or any reaction on the inside of your mouth or nose? Unknown Did you need to seek medical attention at a hospital or doctor's office? Unknown When did it last happen?[place] If all above answers are "NO", may proceed with cephalosporin use.    Patient Measurements: Height: 5\' 5"  (165.1 cm) Weight: 194 lb 10.7 oz (88.3 kg) IBW/kg (Calculated) : 57 Heparin Dosing Weight: 75 kg  Vital Signs: Temp: 97.8 F (36.6 C) (10/03 0902) Temp Source: Oral (10/03 0902) BP: 110/73 (10/03 0902) Pulse Rate: 78 (10/03 0902)  Labs: Recent Labs    08/28/19 0400  08/29/19 0549 08/29/19 0550 08/29/19 2100 08/30/19 0410 08/30/19 0956  HGB 7.9*   < > 7.0*  --  8.7*  --  11.0*  HCT 22.7*   < > 20.3*  --  26.0*  --  32.1*  PLT 256  --  260  --   --   --  216  LABPROT  --   --   --   --  20.8*  --   --   INR  --   --   --   --  1.8*  --   --   HEPARINUNFRC  --    < >  --  0.47  --  <0.10* 0.19*  CREATININE 0.85  --  0.73  --   --  0.80  --    < > = values in this interval not displayed.    Estimated Creatinine Clearance: 88.2 mL/min (by C-G formula based on SCr of 0.8 mg/dL).   Medications:  Scheduled:  . sodium chloride   Intravenous Once  . sodium chloride   Intravenous Once  . chlorhexidine  15 mL Mouth/Throat BID  . Chlorhexidine Gluconate Cloth  6 each Topical Daily  . digoxin  0.125 mg Oral Daily  . feeding supplement  1 Container Oral TID BM  . folic acid  1 mg Oral Daily  . gabapentin  200 mg Oral BID  . lactulose  30 g Per Tube Q6H  . losartan  12.5 mg Oral BID  . mouth rinse  15 mL Mouth Rinse BID  . multivitamin with  minerals  1 tablet Per Tube Daily  . nicotine  21 mg Transdermal Daily  . pantoprazole  40 mg Intravenous Q12H  . rifaximin  550 mg Oral BID  . sodium chloride flush  10 mL Intravenous Q12H  . sodium chloride flush  10 mL Intravenous Q12H  . spironolactone  25 mg Oral Daily  . thiamine  100 mg Oral Daily   Infusions:  . sodium chloride Stopped (08/25/19 2202)  . heparin 1,000 Units/hr (08/29/19 1733)  . norepinephrine (LEVOPHED) Adult infusion Stopped (08/27/19 2055)    Assessment: [name] on heparin for LV thrombus and bilateral lower extremity ischemia w/ right popliteal vein DVT now s/p angioplasty and R/L popliteal thrombectomy of the tibial artery. Heparin therapy complicated by acute CVA and recent GI bleed and need for lower goal. Plans noted for coumadin when no further procedures   She is s/p mech thrombectomy of L popliteal 9/23. Heparin stopped 09/23 @ 1750 due to a GI bleed. Pt  completed course of octreotide. Pt placed on IV PPI for treatment of ulcers. Heparin to continue due to LV thrombus per Dr. Shirlee Latch.  Had bleeding yesterday and is still experiencing some bleeding today. Heparin level is subtherapeutic at 0.19 on heparin rate of 1,000 units/hr. Hgb 11 and plt 260 after receiving 2 units of PRBCs.    Goal of Therapy:  Heparin level 0.3-0.5 units/ml>>closer to 0.3 Monitor platelets by anticoagulation protocol: Yes   Plan:  - Continue heparin gtt at 1000 units/hr - will not increase since bleeding still significant per RN.  - Will be conservative with heparin dosing due to bleeding - Daily heparin level and CBC - Monitor for signs/symptoms of bleeding  Alvia Grove, PharmD PGY1 Acute Care Pharmacy Resident  Please check AMION for all [facility] Pharmacy phone numbers After 10:00 PM, call Main Pharmacy [phone] 08/30/2019 11:45 AM

## 2019-08-30 NOTE — Progress Notes (Signed)
ANTICOAGULATION CONSULT NOTE - Follow Up Consult  Pharmacy Consult for Heparin Indication: DVT, LV thrombus, and acute CVA  Allergies  Allergen Reactions  . Penicillins Other (See Comments)    Unsure of reaction Did it involve swelling of the face/tongue/throat, SOB, or low BP? Unknown Did it involve sudden or severe rash/hives, skin peeling, or any reaction on the inside of your mouth or nose? Unknown Did you need to seek medical attention at a hospital or doctor's office? Unknown When did it last happen?[place] If all above answers are "NO", may proceed with cephalosporin use.    Patient Measurements: Height: 5\' 5"  (165.1 cm) Weight: 194 lb 10.7 oz (88.3 kg) IBW/kg (Calculated) : 57 Heparin Dosing Weight: 75 kg  Vital Signs: Temp: 98.2 F (36.8 C) (10/03 1900) Temp Source: Oral (10/03 1900) BP: 108/71 (10/03 1900) Pulse Rate: 75 (10/03 1900)  Labs: Recent Labs    08/28/19 0400  08/29/19 0549  08/29/19 2100 08/30/19 0410 08/30/19 0956 08/30/19 2108  HGB 7.9*   < > 7.0*  --  8.7*  --  11.0*  --   HCT 22.7*   < > 20.3*  --  26.0*  --  32.1*  --   PLT 256  --  260  --   --   --  216  --   LABPROT  --   --   --   --  20.8*  --   --   --   INR  --   --   --   --  1.8*  --   --   --   HEPARINUNFRC  --    < >  --    < >  --  <0.10* 0.19* 0.14*  CREATININE 0.85  --  0.73  --   --  0.80  --   --    < > = values in this interval not displayed.    Estimated Creatinine Clearance: 88.2 mL/min (by C-G formula based on SCr of 0.8 mg/dL).   Medications:  Scheduled:  . sodium chloride   Intravenous Once  . sodium chloride   Intravenous Once  . chlorhexidine  15 mL Mouth/Throat BID  . Chlorhexidine Gluconate Cloth  6 each Topical Daily  . digoxin  0.125 mg Oral Daily  . feeding supplement  1 Container Oral TID BM  . folic acid  1 mg Oral Daily  . [START ON 08/31/2019] furosemide  40 mg Oral Daily  . gabapentin  200 mg Oral BID  . lactulose  30 g Per Tube Q6H  .  losartan  25 mg Oral BID  . mouth rinse  15 mL Mouth Rinse BID  . multivitamin with minerals  1 tablet Per Tube Daily  . nicotine  21 mg Transdermal Daily  . pantoprazole  40 mg Intravenous Q12H  . rifaximin  550 mg Oral BID  . sodium chloride flush  10 mL Intravenous Q12H  . sodium chloride flush  10 mL Intravenous Q12H  . spironolactone  25 mg Oral Daily  . thiamine  100 mg Oral Daily   Infusions:  . sodium chloride Stopped (08/25/19 2202)  . heparin 1,000 Units/hr (08/30/19 1222)  . norepinephrine (LEVOPHED) Adult infusion Stopped (08/27/19 2055)    Assessment: [name] on heparin for LV thrombus and bilateral lower extremity ischemia w/ right popliteal vein DVT now s/p angioplasty and R/L popliteal thrombectomy of the tibial artery. Heparin therapy complicated by acute CVA and recent GI bleed and  need for lower goal. Plans noted for coumadin when no further procedures   She is s/p mech thrombectomy of L popliteal 9/23. Heparin stopped 09/23 @ 1750 due to a GI bleed. Pt completed course of octreotide. Pt placed on IV PPI for treatment of ulcers. Heparin to continue due to LV thrombus per Dr. Shirlee Latch. -heparin level= 0.14 -some blood noted from rectal tube per RN   Goal of Therapy:  Heparin level 0.3-0.5 units/ml>>closer to 0.3 Monitor platelets by anticoagulation protocol: Yes   Plan:  -No heparin changes now and reassess bleeding in am - Daily heparin level and CBC  Harland German, PharmD Clinical Pharmacist **Pharmacist phone directory can now be found on [email or web address] ([email or web address]).  Listed under [facility].

## 2019-08-30 NOTE — Progress Notes (Signed)
[facility] Progress Note  Subjective: No signs of recurrent bleeding.  Objective: Vital signs in last 24 hours: Temp:  [97.4 F (36.3 C)-98.4 F (36.9 C)] 97.8 F (36.6 C) (10/03 0902) Pulse Rate:  [18-96] 81 (10/03 1208) Resp:  [14-24] 18 (10/03 1208) BP: (92-123)/(52-84) 114/72 (10/03 1208) SpO2:  [97 %-100 %] 98 % (10/03 1208) Weight:  [88.3 kg] 88.3 kg (10/03 0540) Weight change: 2.5 kg   PE: No distress  Lab Results: Results for orders placed or performed during the hospital encounter of 08/15/19 (from the past 24 hour(s))  Prepare RBC     Status: None   Collection Time: 08/29/19  7:58 PM  Result Value Ref Range   Order Confirmation      ORDER PROCESSED BY BLOOD BANK Performed at [facility], [address], [place], [place] [address]   Hemoglobin and hematocrit, blood     Status: Abnormal   Collection Time: 08/29/19  9:00 PM  Result Value Ref Range   Hemoglobin 8.7 (L) 12.0 - 15.0 g/dL   HCT 26.0 (L) 36.0 - 46.0 %  Protime-INR     Status: Abnormal   Collection Time: 08/29/19  9:00 PM  Result Value Ref Range   Prothrombin Time 20.8 (H) 11.4 - 15.2 seconds   INR 1.8 (H) 0.8 - 1.2  .Cooxemetry Panel (carboxy, met, total hgb, O2 sat)     Status: Abnormal   Collection Time: 08/30/19  4:00 AM  Result Value Ref Range   Total hemoglobin 10.0 (L) 12.0 - 16.0 g/dL   O2 Saturation 54.6 %   Carboxyhemoglobin 1.9 (H) 0.5 - 1.5 %   Methemoglobin 1.2 0.0 - 1.5 %  Heparin level (unfractionated)     Status: Abnormal   Collection Time: 08/30/19  4:10 AM  Result Value Ref Range   Heparin Unfractionated <0.10 (L) 0.30 - 0.70 IU/mL  Comprehensive metabolic panel     Status: Abnormal   Collection Time: 08/30/19  4:10 AM  Result Value Ref Range   Sodium 134 (L) 135 - 145 mmol/L   Potassium 4.8 3.5 - 5.1 mmol/L   Chloride 107 98 - 111 mmol/L   CO2 22 22 - 32 mmol/L   Glucose, Bld 72 70 - 99 mg/dL   BUN 18 6 - 20 mg/dL   Creatinine, Ser 0.80 0.44 - 1.00  mg/dL   Calcium 7.2 (L) 8.9 - 10.3 mg/dL   Total Protein 4.9 (L) 6.5 - 8.1 g/dL   Albumin 1.1 (L) 3.5 - 5.0 g/dL   AST 64 (H) 15 - 41 U/L   ALT 34 0 - 44 U/L   Alkaline Phosphatase 161 (H) 38 - 126 U/L   Total Bilirubin 3.8 (H) 0.3 - 1.2 mg/dL   GFR calc non Af Amer >60 >60 mL/min   GFR calc Af Amer >60 >60 mL/min   Anion gap 5 5 - 15  CBC with Differential/Platelet     Status: Abnormal   Collection Time: 08/30/19  9:56 AM  Result Value Ref Range   WBC 8.7 4.0 - 10.5 K/uL   RBC 3.45 (L) 3.87 - 5.11 MIL/uL   Hemoglobin 11.0 (L) 12.0 - 15.0 g/dL   HCT 32.1 (L) 36.0 - 46.0 %   MCV 93.0 80.0 - 100.0 fL   MCH 31.9 26.0 - 34.0 pg   MCHC 34.3 30.0 - 36.0 g/dL   RDW 17.5 (H) 11.5 - 15.5 %   Platelets 216 150 - 400 K/uL   nRBC  0.0 0.0 - 0.2 %   Neutrophils Relative % 77 %   Neutro Abs 6.7 1.7 - 7.7 K/uL   Lymphocytes Relative 17 %   Lymphs Abs 1.5 0.7 - 4.0 K/uL   Monocytes Relative 3 %   Monocytes Absolute 0.3 0.1 - 1.0 K/uL   Eosinophils Relative 1 %   Eosinophils Absolute 0.1 0.0 - 0.5 K/uL   Basophils Relative 1 %   Basophils Absolute 0.0 0.0 - 0.1 K/uL   Immature Granulocytes 1 %   Abs Immature Granulocytes 0.12 (H) 0.00 - 0.07 K/uL  Heparin level (unfractionated)     Status: Abnormal   Collection Time: 08/30/19  9:56 AM  Result Value Ref Range   Heparin Unfractionated 0.19 (L) 0.30 - 0.70 IU/mL    Studies/Results: No results found.    Assessment: GI bleed. Currently stable   Plan:   Follow H and H. Continue PPI.    SAM F Lynnetta Tom 08/30/2019, 3:10 PM  Pager: [phone] If no answer or after 5 PM call [phone] Lab Results  Component Value Date   HGB 11.0 (L) 08/30/2019   HGB 8.7 (L) 08/29/2019   HGB 7.0 (L) 08/29/2019   HCT 32.1 (L) 08/30/2019   HCT 26.0 (L) 08/29/2019   HCT 20.3 (L) 08/29/2019   HCT 44.5 08/16/2019   ALKPHOS 161 (H) 08/30/2019   ALKPHOS 166 (H) 08/29/2019   ALKPHOS 120 08/28/2019   AST 64 (H) 08/30/2019   AST 51 (H) 08/29/2019    AST 48 (H) 08/28/2019   ALT 34 08/30/2019   ALT 32 08/29/2019   ALT 33 08/28/2019

## 2019-08-30 NOTE — Progress Notes (Signed)
Patient ID: Briana Andrews, female   DOB: 22-Aug-1966, 53 y.o.   MRN: [id number]     Advanced Heart Failure Rounding Note  PCP-Cardiologist: Ena Dawley, MD   Subjective:    She remains off dobutamine, co-ox 55% today.  CVP trending up, about 11 today. Tbili down to 3.8, INR 1.8.  Eating meals.   She got 1 unit PRBCs 10/2 with hgb up to 11 today.  Still small amount melena in bag, not sure if old or current.   She has been out of bed.  Feels much better overall.  Alert/oriented.   Heparin gtt ongoing.   EGD (9/29): Bleeding gastric and duodenal ulcers treated in endoscopy suite with resolution of bleeding. No varices.    Objective:   Weight Range: 88.3 kg Body mass index is 32.39 kg/m.   Vital Signs:   Temp:  [97.4 F (36.3 C)-98.5 F (36.9 C)] 97.8 F (36.6 C) (10/03 0902) Pulse Rate:  [18-96] 81 (10/03 1208) Resp:  [14-24] 18 (10/03 1208) BP: (91-123)/(52-84) 114/72 (10/03 1208) SpO2:  [97 %-100 %] 98 % (10/03 1208) Weight:  [88.3 kg] 88.3 kg (10/03 0540) Last BM Date: 08/30/19  Weight change: Filed Weights   08/28/19 0445 08/29/19 0500 08/30/19 0540  Weight: 86.2 kg 85.8 kg 88.3 kg    Intake/Output:   Intake/Output Summary (Last 24 hours) at 08/30/2019 1227 Last data filed at 08/30/2019 0538 Gross per 24 hour  Intake 1136.34 ml  Output 550 ml  Net 586.34 ml      Physical Exam   CVP 11 General: NAD Neck: JVP 9-10 cm, no thyromegaly or thyroid nodule.  Lungs: Decreased BS bases.  CV: Lateral PMI.  Heart regular S1/S2, no S3/S4, no murmur.  2+ edema to knees.  Abdomen: Soft, nontender, no hepatosplenomegaly, no distention.  Skin: Intact without lesions or rashes.  Neurologic: Alert and oriented x 3.  Psych: Normal affect. Extremities: Right foot ischemic changes.  HEENT: Normal.    Telemetry  NSR 80s personally checked.   Labs    CBC Recent Labs    08/29/19 0549 08/29/19 2100 08/30/19 0956  WBC 9.7  --  8.7  NEUTROABS 7.2  --  6.7  HGB  7.0* 8.7* 11.0*  HCT 20.3* 26.0* 32.1*  MCV 95.8  --  93.0  PLT 260  --  528   Basic Metabolic Panel Recent Labs    08/29/19 0549 08/30/19 0410  NA 134* 134*  K 3.8 4.8  CL 108 107  CO2 22 22  GLUCOSE 77 72  BUN 17 18  CREATININE 0.73 0.80  CALCIUM 7.0* 7.2*   Liver Function Tests Recent Labs    08/29/19 0549 08/30/19 0410  AST 51* 64*  ALT 32 34  ALKPHOS 166* 161*  BILITOT 3.7* 3.8*  PROT 4.3* 4.9*  ALBUMIN <1.0* 1.1*   No results for input(s): LIPASE, AMYLASE in the last 72 hours. Cardiac Enzymes No results for input(s): CKTOTAL, CKMB, CKMBINDEX, TROPONINI in the last 72 hours.  BNP: BNP (last 3 results) No results for input(s): BNP in the last 8760 hours.  ProBNP (last 3 results) No results for input(s): PROBNP in the last 8760 hours.   D-Dimer No results for input(s): DDIMER in the last 72 hours. Hemoglobin A1C No results for input(s): HGBA1C in the last 72 hours. Fasting Lipid Panel No results for input(s): CHOL, HDL, LDLCALC, TRIG, CHOLHDL, LDLDIRECT in the last 72 hours. Thyroid Function Tests No results for input(s): TSH, T4TOTAL, T3FREE, THYROIDAB in the  last 72 hours.  Invalid input(s): FREET3  Other results:   Imaging    No results found.   Medications:     Scheduled Medications: . sodium chloride   Intravenous Once  . sodium chloride   Intravenous Once  . chlorhexidine  15 mL Mouth/Throat BID  . Chlorhexidine Gluconate Cloth  6 each Topical Daily  . digoxin  0.125 mg Oral Daily  . feeding supplement  1 Container Oral TID BM  . folic acid  1 mg Oral Daily  . furosemide  40 mg Intravenous Once  . [START ON 08/31/2019] furosemide  40 mg Oral Daily  . gabapentin  200 mg Oral BID  . lactulose  30 g Per Tube Q6H  . losartan  25 mg Oral BID  . mouth rinse  15 mL Mouth Rinse BID  . multivitamin with minerals  1 tablet Per Tube Daily  . nicotine  21 mg Transdermal Daily  . pantoprazole  40 mg Intravenous Q12H  . rifaximin  550 mg  Oral BID  . sodium chloride flush  10 mL Intravenous Q12H  . sodium chloride flush  10 mL Intravenous Q12H  . spironolactone  25 mg Oral Daily  . thiamine  100 mg Oral Daily    Infusions: . sodium chloride Stopped (08/25/19 2202)  . heparin 1,000 Units/hr (08/30/19 1222)  . norepinephrine (LEVOPHED) Adult infusion Stopped (08/27/19 2055)    PRN Medications: sodium chloride, acetaminophen, albuterol, dextromethorphan-guaiFENesin, fentaNYL (SUBLIMAZE) injection, hydrALAZINE, labetalol, ondansetron (ZOFRAN) IV, oxyCODONE, sodium chloride flush, white petrolatum   Assessment/Plan   1. Acute on chronic systolic CHF/cardiogenic shock: Echo with EF 15-20%, mild LV dilation, LV thrombus, mild-moderate MR, severely decreased RV systolic function. Cause of cardiomyopathy is uncertain, but she was a heavy smoker and ECG is suggestive of possible old ASMI. Ischemic cardiomyopathy is possible, alternatively could be due to ETOH abuse (>6 pack/day per husband) or stress cardiomyopathy with critical illness.  Lactate was elevated at admission.  Suspect cardiogenic shock with low BP, cannot rule out component of septic shock. Off dobutamine now with co-ox 55%.  CVP 11 today.  - Lasix 40 mg IV x 1 today then start Lasix 40 mg po daily.  Will place Unna boots.  - Continue spironolactone to 25 mg daily.  - Continue digoxin 0.125.  - Increase losartan to 25 mg bid, creatinine stable.  May be able to transition to [facility] soon.  - Not candidate for advanced therapies with ETOH abuse/liver failure.  - Eventual diagnostic heart cath when bleeding settles down, ?Monday or Tuesday.  2. 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 Markedly elevated bilirubin initially with jaundice.  However liver imaging with [place] and CT has NOT shown cirrhosis or portal/hepatic vein thrombosis. Question has arisen whether this might possibly be due to primarily to RV failure.  Time course seems quite acute for RV failure.   Jaundice/icterus improving. Tbili down to 3.8.  - holding stain and other hepato-toxic agents  3. Encephalopathy: ?Cause.  NH3 has not been markedly high but certainly could be hepatic encephalopathy.  Shock likely played a role.  Oriented today.   - Continue lactulose/rifaxamin per GI.  4. AKI: Creatinine improved with inotropic support.  Suspect cardiorenal syndrome, also had contrast with LE angiograms so possible component of contrast nephropathy. Creatinine normalized.  5. LV thrombus: Possible cause of embolic event to popliteal arteries as well as subacute CVA.  Heparin gtt held with active GI bleeding initially, then restarted 9/24. INR up  to 4.0 with poor nutrition, down to 1.8 today after 1 mg po vitamin K earlier in stay and eating better. - With embolic events recently, will continue heparin gtt at low therapeutic range despite GI bleeding.    6. PAD: Bilateral popliteal artery occlusions, suspect cardioembolic from LV thrombus.  Now s/p mechanical thrombectomy by vascular. Right foot still looks ischemic but dopplerable pulses.  7. 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  Treatment will be anticoagulation.  8. Right popliteal vein DVT: Anticoagulated.  9. GI bleeding: Upper GI bleeding, EGD with bleeding duodenal and gastric ulcer on 9/29 that were treated, no varices.  Ongoing melena in rectal bag. GI thinks just residual old blood and not active bleeding.  She had 1 unit PRBCs on 9/30 and 1 unit on 10/2.  Today, hgb up to 11, hopefully bleeding resolving.    - Off octreotide, continue Protonix per GI (currently gtt).   10. ETOH abuse: Per husband, at least a 6 pack daily. Plans to quit.  11. ID: Concern for aspiration PNA. - Completed course of ceftriaxone.  12. Deconditioning: Out of bed to chair, work with PT.    Length of Stay: 71  Marca Ancona, MD  08/30/2019, 12:27 PM  Advanced Heart Failure Team Pager [phone] (M-F; 7a - 4p)  Please contact [facility] for night-coverage after hours (4p -7a ) and weekends on [email or web address]

## 2019-08-31 ENCOUNTER — Inpatient Hospital Stay (HOSPITAL_COMMUNITY): Payer: 59

## 2019-08-31 DIAGNOSIS — I6389 Other cerebral infarction: Secondary | ICD-10-CM

## 2019-08-31 LAB — CBC
HCT: 30.3 % — ABNORMAL LOW (ref 36.0–46.0)
HCT: 25.7 % — ABNORMAL LOW (ref 36.0–46.0)
Hemoglobin: 10.7 g/dL — ABNORMAL LOW (ref 12.0–15.0)
Hemoglobin: 8.7 g/dL — ABNORMAL LOW (ref 12.0–15.0)
MCH: 32.7 pg (ref 26.0–34.0)
MCH: 32 pg (ref 26.0–34.0)
MCHC: 35.3 g/dL (ref 30.0–36.0)
MCHC: 33.9 g/dL (ref 30.0–36.0)
MCV: 92.7 fL (ref 80.0–100.0)
MCV: 94.5 fL (ref 80.0–100.0)
Platelets: 233 10*3/uL (ref 150–400)
Platelets: 197 10*3/uL (ref 150–400)
RBC: 3.27 MIL/uL — ABNORMAL LOW (ref 3.87–5.11)
RBC: 2.72 MIL/uL — ABNORMAL LOW (ref 3.87–5.11)
RDW: 18.2 % — ABNORMAL HIGH (ref 11.5–15.5)
RDW: 17.8 % — ABNORMAL HIGH (ref 11.5–15.5)
WBC: 8.8 10*3/uL (ref 4.0–10.5)
WBC: 7.1 10*3/uL (ref 4.0–10.5)
nRBC: 0 % (ref 0.0–0.2)
nRBC: 0 % (ref 0.0–0.2)

## 2019-08-31 LAB — COMPREHENSIVE METABOLIC PANEL
ALT: 36 U/L (ref 0–44)
AST: 53 U/L — ABNORMAL HIGH (ref 15–41)
Albumin: 1 g/dL — ABNORMAL LOW (ref 3.5–5.0)
Alkaline Phosphatase: 137 U/L — ABNORMAL HIGH (ref 38–126)
Anion gap: 6 (ref 5–15)
BUN: 20 mg/dL (ref 6–20)
CO2: 21 mmol/L — ABNORMAL LOW (ref 22–32)
Calcium: 7.1 mg/dL — ABNORMAL LOW (ref 8.9–10.3)
Chloride: 106 mmol/L (ref 98–111)
Creatinine, Ser: 0.78 mg/dL (ref 0.44–1.00)
GFR calc Af Amer: >60 mL/min (ref >60)
GFR calc non Af Amer: >60 mL/min (ref >60)
Glucose, Bld: 66 mg/dL — ABNORMAL LOW (ref 70–99)
Potassium: 3.9 mmol/L (ref 3.5–5.1)
Sodium: 133 mmol/L — ABNORMAL LOW (ref 135–145)
Total Bilirubin: 3.5 mg/dL — ABNORMAL HIGH (ref 0.3–1.2)
Total Protein: 4.8 g/dL — ABNORMAL LOW (ref 6.5–8.1)

## 2019-08-31 LAB — GLUCOSE, CAPILLARY
Glucose-Capillary: 53 mg/dL — ABNORMAL LOW (ref 70–99)
Glucose-Capillary: 48 mg/dL — ABNORMAL LOW (ref 70–99)
Glucose-Capillary: 70 mg/dL (ref 70–99)
Glucose-Capillary: 67 mg/dL — ABNORMAL LOW (ref 70–99)
Glucose-Capillary: 95 mg/dL (ref 70–99)

## 2019-08-31 LAB — BPAM RBC
Blood Product Expiration Date: 202010222359
Blood Product Expiration Date: 202011012359
Blood Product Expiration Date: 202011012359
ISSUE DATE / TIME: 202010021543
ISSUE DATE / TIME: 202010030103
ISSUE DATE / TIME: 202010030356
Unit Type and Rh: 6200
Unit Type and Rh: 6200
Unit Type and Rh: 6200

## 2019-08-31 LAB — TYPE AND SCREEN
ABO/RH(D): A POS
Antibody Screen: NEGATIVE
Unit division: 0
Unit division: 0
Unit division: 0

## 2019-08-31 LAB — COOXEMETRY PANEL
Carboxyhemoglobin: 1.9 % — ABNORMAL HIGH (ref 0.5–1.5)
Methemoglobin: 0.5 % (ref 0.0–1.5)
O2 Saturation: 80.4 %
Total hemoglobin: 8.6 g/dL — ABNORMAL LOW (ref 12.0–16.0)

## 2019-08-31 LAB — HEMOGLOBIN AND HEMATOCRIT, BLOOD
HCT: 27 % — ABNORMAL LOW (ref 36.0–46.0)
HCT: 26.5 % — ABNORMAL LOW (ref 36.0–46.0)
Hemoglobin: 9.3 g/dL — ABNORMAL LOW (ref 12.0–15.0)
Hemoglobin: 9.3 g/dL — ABNORMAL LOW (ref 12.0–15.0)

## 2019-08-31 LAB — ECHOCARDIOGRAM COMPLETE
Height: 65 in
Weight: 2994.73 oz

## 2019-08-31 LAB — HEPARIN LEVEL (UNFRACTIONATED): Heparin Unfractionated: 0.23 IU/mL — ABNORMAL LOW (ref 0.30–0.70)

## 2019-08-31 MED ORDER — LACTULOSE 10 GM/15ML PO SOLN
30.0000 g | Freq: Every day | ORAL | Status: DC
  Filled 2019-08-31: qty 45

## 2019-08-31 MED ORDER — SODIUM CHLORIDE 0.9 % IV BOLUS: 500.0000 mL | Freq: Once | INTRAVENOUS | Status: AC

## 2019-08-31 MED ORDER — SODIUM CHLORIDE 0.9 % IV BOLUS
500.0000 mL | Freq: Once | INTRAVENOUS | Status: AC
  Administered 2019-08-31: 500 mL via INTRAVENOUS

## 2019-08-31 MED ORDER — OXYCODONE HCL 5 MG PO TABS
5.0000 mg | ORAL_TABLET | Freq: Four times a day (QID) | ORAL | Status: DC | PRN
  Administered 2019-09-05 – 2019-09-10 (×4): 5 mg via ORAL
  Filled 2019-08-31 (×4): qty 1

## 2019-08-31 NOTE — Progress Notes (Addendum)
Unable to empty her bladder. Bladder scan performed, found > 782 ml of residual urine post voiding. Initiated indwelling urinary catheter per protocal, inserted 16 Fr. No vaginal bleeding seen at this time.   Her BP has been low since day shift, SBP 80s. Notified on call provider, Jeannette Corpus, New order received for 500 ml NSS bolus. Will continue to monitor.  Kennyth Lose, RN

## 2019-08-31 NOTE — Progress Notes (Signed)
PROGRESS NOTE  Briana Andrews NLZ:[id number] DOB: [birth year] DOA: 08/15/2019 PCP: Patient, [name]  Brief History   53 yo alcoholic woman admitted with R frontal CVA, B popliteal artery occlusions, hepatic failure, GI bleeding. She was found to have new cardiomyopathy with an LV thrombus, cirrhosis, ARF. Underwent B LE thrombectomies, was treated with heparin, stabilized. Has required hemodynamic support w dobutamine. Received PRBC and was able to undergo EGD 9/29 that showed duodenal and gastric ulcers which were treated endoscopically. Remains on dobuta, PPI gtt, heparin.   On 08/29/2019 the patient developed bleeding from multiple sites including her mouth, urine, and rectum. Pharmacy had reduced the dose and goal heparin level earlier in the day. I discussed with pharmacy. The heparin will be held for one hour and then restarted at yet an even lower dose. The patient will be carefully monitored for further bleeding.  Consultants  . Palliative Care . PCCM . Heart Failure Team . Vascular Surgery . Cardiology  Procedures  . EGD/Epinephrine injection . Mechanical Intubation . Central Venous Catheter . Extubation . Arteriogram of right and left lower extremities . Mechanical thrombectomy of the left popliteal and posterior tibial artery using Penumbra CAT 6 device Mechanical thrombectomy of the right popliteal and posterior tibial artery using Penumbra CAT 6 device  Antibiotics   Anti-infectives (From admission, onward)   Start     Dose/Rate Route Frequency Ordered Stop   08/28/19 2200  rifaximin (XIFAXAN) tablet 550 mg     550 mg Oral 2 times daily 08/28/19 0934     08/26/19 1000  rifaximin (XIFAXAN) tablet 550 mg  Status:  Discontinued     550 mg Per Tube 2 times daily 08/25/19 2113 08/28/19 0935   08/20/19 1800  cefTRIAXone (ROCEPHIN) 2 g in sodium chloride 0.9 % 100 mL IVPB  Status:  Discontinued     2 g 200 mL/hr over 30 Minutes Intravenous Every 24 hours 08/20/19 1600  08/23/19 0825   08/18/19 0700  levofloxacin (LEVAQUIN) tablet 750 mg  Status:  Discontinued     750 mg Oral Every 48 hours 08/17/19 1205 08/20/19 1600   08/16/19 1115  rifaximin (XIFAXAN) tablet 550 mg  Status:  Discontinued     550 mg Oral 2 times daily 08/16/19 1114 08/25/19 2113   08/16/19 0700  levofloxacin (LEVAQUIN) IVPB 750 mg  Status:  Discontinued     750 mg 100 mL/hr over 90 Minutes Intravenous Every 48 hours 08/16/19 0616 08/17/19 1205     Subjective  The patient is resting comfortably. Rectal bleeding resumed this morning. Nursing states that the blood was copious.   Objective   Vitals:  Vitals:   08/31/19 0838 08/31/19 1202  BP:  106/66  Pulse: 84 79  Resp:  20  Temp:    SpO2:  100%   Exam:  Constitutional:  . The patient is awake, alert, and oriented x 3. No acute distress. Respiratory:  . No increased work of breathing. . No wheezes, rales, or rhonchi . No tactile fremitus Cardiovascular:  . Regular rate and rhythm . No murmurs, ectopy, or gallups. . No lateral PMI. No thrills. Abdomen:  . Abdomen is soft, non-tender, non-distended . No hernias, masses, or organomegaly . Normoactive bowel sounds.  Musculoskeletal:  . No cyanosis, clubbing, or edema Skin:  . No rashes, lesions, ulcers . palpation of skin: no induration or nodules Neurologic:  . CN 2-12 intact . Sensation all 4 extremities intact Psychiatric:  . Mental status o Mood: Depressed, affect  flat o Orientation to person, place, time  . judgment and insight appear intact  I have personally reviewed the following:   Today's Data  . Vitals, CBC, CMP  Scheduled Meds: . sodium chloride   Intravenous Once  . sodium chloride   Intravenous Once  . chlorhexidine  15 mL Mouth/Throat BID  . Chlorhexidine Gluconate Cloth  6 each Topical Daily  . digoxin  0.125 mg Oral Daily  . feeding supplement  1 Container Oral TID BM  . folic acid  1 mg Oral Daily  . furosemide  40 mg Oral Daily  .  gabapentin  200 mg Oral BID  . lactulose  30 g Per Tube Q6H  . losartan  25 mg Oral BID  . mouth rinse  15 mL Mouth Rinse BID  . multivitamin with minerals  1 tablet Per Tube Daily  . nicotine  21 mg Transdermal Daily  . pantoprazole  40 mg Intravenous Q12H  . rifaximin  550 mg Oral BID  . sodium chloride flush  10 mL Intravenous Q12H  . sodium chloride flush  10 mL Intravenous Q12H  . spironolactone  25 mg Oral Daily  . thiamine  100 mg Oral Daily   Continuous Infusions: . sodium chloride Stopped (08/25/19 2202)  . heparin Stopped (08/31/19 0757)  . norepinephrine (LEVOPHED) Adult infusion Stopped (08/27/19 2055)    Principal Problem:   Cerebral embolism with cerebral infarction Active Problems:   Abnormal LFTs   Acute metabolic encephalopathy   SOB (shortness of breath)   AKI (acute kidney injury) ([place])   Fall   Lactic acidosis   Back pain   Liver failure without hepatic coma (HCC)   Multifocal pneumonia   PVD (peripheral vascular disease) (HCC)   Left ventricular apical thrombus without MI ([place])   Acute CHF (congestive heart failure) ([place])   Jaundice   Shock circulatory ([place])   Palliative care by specialist   Goals of care, counseling/discussion   Advanced care planning/counseling discussion   Acute upper GI bleed   Pressure injury of skin   GI bleed   LOS: 15 days   A & P  Biventricular failure some combination ischemic event vs stress-induced (from shock state, acidemia, ABLA, ?aspiration-induced sepsis). Dobutamine weaned to off.  The patient was successfully extubated after EGD yesterday and respiratory status is stable on 4L [place]. Eventually she will need LHC to evaluate for biventricular failure.  Hemorrhage from mouth, urine, and rectum per nursing: Pharmacy had reduced the dose and goal heparin level earlier in the day. I discussed with pharmacy. The heparin will be held for one hour and then restarted at yet an even lower dose. Again this morning nursing is  reporting a large amount of blood underneath the patient assumed to be from her rectum or possibly her vagina. Hemoglobin has dropped from 10.4 to 8.7 this morning. Cardiology aware, and states that pt continues to require heparin, although it may be held intermittently. GI also aware, but does not feel that any intervention is necessary. Heparin has been held since this morning. Restart this evening. Monitor hemoglobin.  LV clot on AC:  ASA 81 mg daily. Continue heparin drip. Monitor CBC.  GI Bleed: The patient presented with melena with suspicion for upper GI source, intermittent transfusion requirements. EGD demonstrated oozing 74mm pre-pyloric ulcer. This was injected with Epi. Hgb 7.5 overnight and received 1 unit PRBC's. Hemoglobin dropped to 7 again on 08/29/2019 after observation of bleeding in mouth, in urine, and in  stool. Heparin is held for one hour and dose reduced for the second time. The patient was transfused with 2 units PRBC's. Bleeding stopped overnight. Hemoglobin now stable. Monitor.  Alcoholism, hyperbilirubinemia, clinically she has cirrhosis although imaging features not c/w this; LFT pattern not c/w alcoholic hepatitis nor ischemic nor congestive. We will continue lactulose, rifaximin, thiamine and folate.  Encephalopathy: Combination from critical illness, hepatic, and CVA, still has asterixis, improving overall. Continue lactulose.  AKI: Improving related to initial shock, blood loss.  CVA: likely secondary to emboli from LV clot.  BL femoral artery occlusion s/p BL thrombectomy:  RLE does not look great but dopplerable pulses, vascular surgery aware, poor candidate for further procedures, monitor clinically.  Severe protein calorie malnutrition due to alcoholism: Nutrition Consult.  Rt sided pleural effusion without resp distress: likely due to hepatic hydrothorax vs. distrubutive from poor oncotic pressure. No indication to drain.  I have seen and examined this  patient myself. I have spent 30 minutes in her evaluation and care.  Handy Mcloud, DO [facility] Direct contact: see [email or web address]  7PM-7AM contact night coverage as above 08/31/2019, 2:29 PM  LOS: 12 days

## 2019-08-31 NOTE — Progress Notes (Signed)
ANTICOAGULATION CONSULT NOTE - Follow Up Consult  Pharmacy Consult for Heparin Indication: DVT, LV thrombus, and acute CVA  Allergies  Allergen Reactions  . Penicillins Other (See Comments)    Unsure of reaction Did it involve swelling of the face/tongue/throat, SOB, or low BP? Unknown Did it involve sudden or severe rash/hives, skin peeling, or any reaction on the inside of your mouth or nose? Unknown Did you need to seek medical attention at a hospital or doctor's office? Unknown When did it last happen?[place] If all above answers are "NO", may proceed with cephalosporin use.    Patient Measurements: Height: 5\' 5"  (165.1 cm) Weight: 187 lb 2.7 oz (84.9 kg) IBW/kg (Calculated) : 57 Heparin Dosing Weight: 75 kg  Vital Signs: Temp: 97.5 F (36.4 C) (10/04 0239) Temp Source: Oral (10/04 0239) BP: 102/81 (10/04 0239) Pulse Rate: 84 (10/04 0247)  Labs: Recent Labs    08/29/19 0549  08/29/19 2100 08/30/19 0410 08/30/19 0956 08/30/19 2108 08/31/19 0318 08/31/19 0319  HGB 7.0*  --  8.7*  --  11.0*  --  10.7*  --   HCT 20.3*  --  26.0*  --  32.1*  --  30.3*  --   PLT 260  --   --   --  216  --  233  --   LABPROT  --   --  20.8*  --   --   --   --   --   INR  --   --  1.8*  --   --   --   --   --   HEPARINUNFRC  --    < >  --  <0.10* 0.19* 0.14*  --  0.23*  CREATININE 0.73  --   --  0.80  --   --  0.78  --    < > = values in this interval not displayed.    Estimated Creatinine Clearance: 86.6 mL/min (by C-G formula based on SCr of 0.78 mg/dL).   Assessment: [name] on heparin for LV thrombus and bilateral lower extremity ischemia w/ right popliteal vein DVT now s/p angioplasty and R/L popliteal thrombectomy of the tibial artery. Heparin therapy complicated by acute CVA and recent GI bleed and need for lower goal. Plans noted for coumadin when no further procedures   She is s/p mech thrombectomy of L popliteal 9/23. Heparin stopped 09/23 @ 1750 due to a GI bleed.  Pt completed course of octreotide. Pt placed on IV PPI for treatment of ulcers. Heparin to continue due to LV thrombus per Dr. Aundra Dubin. -heparin level= 0.23 units/ml  Hg 10.7  PTLC 233   Goal of Therapy:  Heparin level 0.3-0.5 units/ml>>closer to 0.3 Monitor platelets by anticoagulation protocol: Yes   Plan:  -Continue heparin at 1000 units/hr - Daily heparin level and CBC  Thanks for allowing pharmacy to be a part of this patient's care.  Excell Seltzer, PharmD Clinical Pharmacist

## 2019-08-31 NOTE — Progress Notes (Signed)
Randomly rechecked CBG 55 mg/dl this am after morning lab result from BMP was 66 mg/dl . Apple juice and Boost Breeze supplement given. Will recheck her CBG again. Continue to monitor.  Kennyth Lose, RN

## 2019-08-31 NOTE — Progress Notes (Signed)
Pt blanket saturated with blood. Upon further assessment, pt with large pool of vaginal blood with multiple clots. Pt VSS, pt with no complaints. MD paged, heparin stopped, and CBC obtained. Will continue to monitor.  Amanda Cockayne, RN

## 2019-08-31 NOTE — Progress Notes (Signed)
Orthopedic Tech Progress Note Patient Details:  Briana Andrews Dec 23, 1965 [id number]  Ortho Devices Type of Ortho Device: Ace wrap, Unna boot Ortho Device/Splint Location: Applied unna boot to the left leg. can not appy to right leg because of required daily dressing change. Ortho Device/Splint Interventions: Application   Post Interventions Patient Tolerated: Well Instructions Provided: Care of device   [name] 08/31/2019, 9:40 AM

## 2019-08-31 NOTE — Progress Notes (Signed)
At 03:00am.Pt complained she was unable to urinate. We found urine retention from bladder scanned 600 ml. Initiated  intermittent straight cath with 16 Fr got 600 ml with clear and yellowish urine. She has been  incontinent and was able to pee twice during the day, she wet her bottom sheet one time tonight. Will continue to monitor.   Kennyth Lose, RN

## 2019-08-31 NOTE — Progress Notes (Signed)
[facility] Progress Note  Subjective: The patient states that she has not had any further gastrointestinal bleeding but today had vaginal bleeding which she states was like her period. No abdominal pain, no hematemesis  Objective: Vital signs in last 24 hours: Temp:  [97.5 F (36.4 C)-98.3 F (36.8 C)] 97.9 F (36.6 C) (10/04 0725) Pulse Rate:  [75-122] 84 (10/04 0838) Resp:  [18-25] 23 (10/04 0800) BP: (100-114)/(63-81) 112/63 (10/04 0800) SpO2:  [96 %-100 %] 100 % (10/04 0800) Weight:  [84.9 kg] 84.9 kg (10/04 0247) Weight change: -3.4 kg   PE:  No distress  Slightly slowed mentation  Abdomen soft  Lab Results: Results for orders placed or performed during the hospital encounter of 08/15/19 (from the past 24 hour(s))  Heparin level (unfractionated)     Status: Abnormal   Collection Time: 08/30/19  9:08 PM  Result Value Ref Range   Heparin Unfractionated 0.14 (L) 0.30 - 0.70 IU/mL  CBC     Status: Abnormal   Collection Time: 08/31/19  3:18 AM  Result Value Ref Range   WBC 8.8 4.0 - 10.5 K/uL   RBC 3.27 (L) 3.87 - 5.11 MIL/uL   Hemoglobin 10.7 (L) 12.0 - 15.0 g/dL   HCT 30.3 (L) 36.0 - 46.0 %   MCV 92.7 80.0 - 100.0 fL   MCH 32.7 26.0 - 34.0 pg   MCHC 35.3 30.0 - 36.0 g/dL   RDW 18.2 (H) 11.5 - 15.5 %   Platelets 233 150 - 400 K/uL   nRBC 0.0 0.0 - 0.2 %  Comprehensive metabolic panel     Status: Abnormal   Collection Time: 08/31/19  3:18 AM  Result Value Ref Range   Sodium 133 (L) 135 - 145 mmol/L   Potassium 3.9 3.5 - 5.1 mmol/L   Chloride 106 98 - 111 mmol/L   CO2 21 (L) 22 - 32 mmol/L   Glucose, Bld 66 (L) 70 - 99 mg/dL   BUN 20 6 - 20 mg/dL   Creatinine, Ser 0.78 0.44 - 1.00 mg/dL   Calcium 7.1 (L) 8.9 - 10.3 mg/dL   Total Protein 4.8 (L) 6.5 - 8.1 g/dL   Albumin 1.0 (L) 3.5 - 5.0 g/dL   AST 53 (H) 15 - 41 U/L   ALT 36 0 - 44 U/L   Alkaline Phosphatase 137 (H) 38 - 126 U/L   Total Bilirubin 3.5 (H) 0.3 - 1.2 mg/dL   GFR calc non Af Amer  >60 >60 mL/min   GFR calc Af Amer >60 >60 mL/min   Anion gap 6 5 - 15  Heparin level (unfractionated)     Status: Abnormal   Collection Time: 08/31/19  3:19 AM  Result Value Ref Range   Heparin Unfractionated 0.23 (L) 0.30 - 0.70 IU/mL  .Cooxemetry Panel (carboxy, met, total hgb, O2 sat)     Status: Abnormal   Collection Time: 08/31/19  4:02 AM  Result Value Ref Range   Total hemoglobin 8.6 (L) 12.0 - 16.0 g/dL   O2 Saturation 80.4 %   Carboxyhemoglobin 1.9 (H) 0.5 - 1.5 %   Methemoglobin 0.5 0.0 - 1.5 %  Glucose, capillary     Status: Abnormal   Collection Time: 08/31/19  7:22 AM  Result Value Ref Range   Glucose-Capillary 53 (L) 70 - 99 mg/dL  Glucose, capillary     Status: Abnormal   Collection Time: 08/31/19  7:48 AM  Result Value Ref Range   Glucose-Capillary 48 (L) 70 - 99  mg/dL  Glucose, capillary     Status: None   Collection Time: 08/31/19  8:37 AM  Result Value Ref Range   Glucose-Capillary 70 70 - 99 mg/dL  CBC     Status: Abnormal   Collection Time: 08/31/19  8:39 AM  Result Value Ref Range   WBC 7.1 4.0 - 10.5 K/uL   RBC 2.72 (L) 3.87 - 5.11 MIL/uL   Hemoglobin 8.7 (L) 12.0 - 15.0 g/dL   HCT 25.7 (L) 36.0 - 46.0 %   MCV 94.5 80.0 - 100.0 fL   MCH 32.0 26.0 - 34.0 pg   MCHC 33.9 30.0 - 36.0 g/dL   RDW 17.8 (H) 11.5 - 15.5 %   Platelets 197 150 - 400 K/uL   nRBC 0.0 0.0 - 0.2 %    Studies/Results: No results found.    Assessment: Cirrhosis of liver  Duodenal and pyloric channel ulcer previously found to be a source of GI bleeding and treated.  This slowed mentation could be hepatic encephalopathy  New vaginal bleeding reported today.  Plan:   Continue supportive care. No GI intervention required at this time.    Cassell Clement 08/31/2019, 10:54 AM  Pager: [phone] If no answer or after 5 PM call [phone] Lab Results  Component Value Date   HGB 8.7 (L) 08/31/2019   HGB 10.7 (L) 08/31/2019   HGB 11.0 (L) 08/30/2019   HCT 25.7 (L)  08/31/2019   HCT 30.3 (L) 08/31/2019   HCT 32.1 (L) 08/30/2019   HCT 44.5 08/16/2019   ALKPHOS 137 (H) 08/31/2019   ALKPHOS 161 (H) 08/30/2019   ALKPHOS 166 (H) 08/29/2019   AST 53 (H) 08/31/2019   AST 64 (H) 08/30/2019   AST 51 (H) 08/29/2019   ALT 36 08/31/2019   ALT 34 08/30/2019   ALT 32 08/29/2019

## 2019-08-31 NOTE — Progress Notes (Signed)
Patient ID: Briana Andrews, female   DOB: Sep 24, 1966, 53 y.o.   MRN: [id number]     Advanced Heart Failure Rounding Note  PCP-Cardiologist: Tobias Alexander, MD   Subjective:    She remains off dobutamine, co-ox 80% today.  CVP 9 this morning, weight down with Lasix IV yesterday (I/Os in accurate). Tbili down to 3.5.   She got 1 unit PRBCs 10/2 with hgb 10.7 today.  Some dark stool but suspect old.  GI bleeding seems to have resolved.  However, this morning she has had significant vaginal bleeding.    She is alert, oriented.  Per nursing does get confused at night.    Heparin gtt ongoing.   EGD (9/29): Bleeding gastric and duodenal ulcers treated in endoscopy suite with resolution of bleeding. No varices.    Objective:   Weight Range: 84.9 kg Body mass index is 31.15 kg/m.   Vital Signs:   Temp:  [97.5 F (36.4 C)-98.3 F (36.8 C)] 97.9 F (36.6 C) (10/04 0725) Pulse Rate:  [75-122] 122 (10/04 0725) Resp:  [18-25] 21 (10/04 0725) BP: (100-114)/(71-81) 100/79 (10/04 0725) SpO2:  [96 %-100 %] 100 % (10/04 0500) Weight:  [84.9 kg] 84.9 kg (10/04 0247) Last BM Date: 08/31/19  Weight change: Filed Weights   08/29/19 0500 08/30/19 0540 08/31/19 0247  Weight: 85.8 kg 88.3 kg 84.9 kg    Intake/Output:   Intake/Output Summary (Last 24 hours) at 08/31/2019 0803 Last data filed at 08/31/2019 0400 Gross per 24 hour  Intake 738.77 ml  Output 900 ml  Net -161.23 ml      Physical Exam   CVP 9 General: NAD Neck: JVP 8-9 cm, no thyromegaly or thyroid nodule.  Lungs: Clear to auscultation bilaterally with normal respiratory effort. CV: Lateral PMI.  Heart regular S1/S2, no S3/S4, no murmur.  1+ edema to knees. Abdomen: Soft, nontender, no hepatosplenomegaly, no distention.  Skin: Intact without lesions or rashes.  Neurologic: Alert and oriented x 3.  Psych: Normal affect. Extremities: No clubbing or cyanosis. Right foot ischemic changes.  HEENT: Normal.    Telemetry   NSR 80s-90s personally checked.   Labs    CBC Recent Labs    08/29/19 0549  08/30/19 0956 08/31/19 0318  WBC 9.7  --  8.7 8.8  NEUTROABS 7.2  --  6.7  --   HGB 7.0*   < > 11.0* 10.7*  HCT 20.3*   < > 32.1* 30.3*  MCV 95.8  --  93.0 92.7  PLT 260  --  216 233   < > = values in this interval not displayed.   Basic Metabolic Panel Recent Labs    33/83/29 0410 08/31/19 0318  NA 134* 133*  K 4.8 3.9  CL 107 106  CO2 22 21*  GLUCOSE 72 66*  BUN 18 20  CREATININE 0.80 0.78  CALCIUM 7.2* 7.1*   Liver Function Tests Recent Labs    08/30/19 0410 08/31/19 0318  AST 64* 53*  ALT 34 36  ALKPHOS 161* 137*  BILITOT 3.8* 3.5*  PROT 4.9* 4.8*  ALBUMIN 1.1* 1.0*   No results for input(s): LIPASE, AMYLASE in the last 72 hours. Cardiac Enzymes No results for input(s): CKTOTAL, CKMB, CKMBINDEX, TROPONINI in the last 72 hours.  BNP: BNP (last 3 results) No results for input(s): BNP in the last 8760 hours.  ProBNP (last 3 results) No results for input(s): PROBNP in the last 8760 hours.   D-Dimer No results for input(s): DDIMER in the last  72 hours. Hemoglobin A1C No results for input(s): HGBA1C in the last 72 hours. Fasting Lipid Panel No results for input(s): CHOL, HDL, LDLCALC, TRIG, CHOLHDL, LDLDIRECT in the last 72 hours. Thyroid Function Tests No results for input(s): TSH, T4TOTAL, T3FREE, THYROIDAB in the last 72 hours.  Invalid input(s): FREET3  Other results:   Imaging    No results found.   Medications:     Scheduled Medications: . sodium chloride   Intravenous Once  . sodium chloride   Intravenous Once  . chlorhexidine  15 mL Mouth/Throat BID  . Chlorhexidine Gluconate Cloth  6 each Topical Daily  . digoxin  0.125 mg Oral Daily  . feeding supplement  1 Container Oral TID BM  . folic acid  1 mg Oral Daily  . furosemide  40 mg Oral Daily  . gabapentin  200 mg Oral BID  . lactulose  30 g Per Tube Q6H  . losartan  25 mg Oral BID  . mouth  rinse  15 mL Mouth Rinse BID  . multivitamin with minerals  1 tablet Per Tube Daily  . nicotine  21 mg Transdermal Daily  . pantoprazole  40 mg Intravenous Q12H  . rifaximin  550 mg Oral BID  . sodium chloride flush  10 mL Intravenous Q12H  . sodium chloride flush  10 mL Intravenous Q12H  . spironolactone  25 mg Oral Daily  . thiamine  100 mg Oral Daily    Infusions: . sodium chloride Stopped (08/25/19 2202)  . heparin 1,000 Units/hr (08/30/19 1222)  . norepinephrine (LEVOPHED) Adult infusion Stopped (08/27/19 2055)    PRN Medications: sodium chloride, acetaminophen, albuterol, dextromethorphan-guaiFENesin, fentaNYL (SUBLIMAZE) injection, hydrALAZINE, labetalol, ondansetron (ZOFRAN) IV, oxyCODONE, sodium chloride flush, white petrolatum   Assessment/Plan   1. Acute on chronic systolic CHF/cardiogenic shock: Echo with EF 15-20%, mild LV dilation, LV thrombus, mild-moderate MR, severely decreased RV systolic function. Cause of cardiomyopathy is uncertain, but she was a heavy smoker and ECG is suggestive of possible old ASMI. Ischemic cardiomyopathy is possible, alternatively could be due to ETOH abuse (>6 pack/day per husband) or stress cardiomyopathy with critical illness.  Lactate was elevated at admission.  Suspect cardiogenic shock with low BP, cannot rule out component of septic shock. Off dobutamine now with co-ox 80%.  CVP 9 today.  - Continue Lasix 40 mg po daily.  Will place Unna boots.  - Continue spironolactone 25 mg daily.  - Continue digoxin 0.125.  - Continue losartan 25 mg bid, creatinine stable.  May be able to transition to [facility] soon.  - Not candidate for advanced therapies with ETOH abuse/liver failure.  - Eventual diagnostic heart cath when bleeding settles down => GI bleeding better but now with vaginal bleeding. ?Tuesday.  2. 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 Markedly elevated bilirubin initially with jaundice.  However liver imaging with [place]  and CT has NOT shown cirrhosis or portal/hepatic vein thrombosis. Question has arisen whether this might possibly be due to primarily to RV failure.  Time course seems quite acute for RV failure.  Jaundice/icterus improving. Tbili down to 3.5.  - holding stain and other hepato-toxic agents  3. Encephalopathy: ?Cause.  NH3 has not been markedly high but certainly could be hepatic encephalopathy.  Shock likely played a role.  - Mostly resolved, some confusion overnight.   - Continue lactulose/rifaxamin per GI => holding lactulose with lose stools.   4. AKI: Creatinine improved with inotropic support.  Suspect cardiorenal syndrome, also had contrast  with LE angiograms so possible component of contrast nephropathy. Creatinine normalized.  5. LV thrombus: Possible cause of embolic event to popliteal arteries as well as subacute CVA.  Heparin gtt held with active GI bleeding initially, then restarted 9/24. INR up to 4.0 with poor nutrition, down to 1.8 today after 1 mg po vitamin K earlier in stay and eating better. - With embolic events recently, have continued heparin gtt at low therapeutic range despite GI bleeding.    6. PAD: Bilateral popliteal artery occlusions, suspect cardioembolic from LV thrombus.  Now s/p mechanical thrombectomy by vascular. Right foot still looks ischemic but dopplerable pulses.  7. 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  Treatment will be anticoagulation.  8. Right popliteal vein DVT: Anticoagulated.  9. GI bleeding: Upper GI bleeding, EGD with bleeding duodenal and gastric ulcer on 9/29 that were treated, no varices.  Ongoing melena in rectal bag but decreasing. GI thinks just residual old blood and not active bleeding.  She had 1 unit PRBCs on 9/30 and 1 unit on 10/2.  Today, hgb 10.7.    - Off octreotide, continue IV Protonix boluses.    10. ETOH abuse: Per husband, at least a 6 pack daily. Plans to quit.  11. ID: Concern for aspiration PNA. -  Completed course of ceftriaxone.  12. Vaginal bleeding: She has had a significant amount this morning.   - Resend CBC. - Can hold heparin for a few hours but would try to restart soon with embolic events.  - Per [facility], suspect she needs gynecology eval.  13. Deconditioning: Out of bed to chair, work with PT.    Length of Stay: 15  Marca Ancona, MD  08/31/2019, 8:03 AM  Advanced Heart Failure Team Pager [phone] (M-F; 7a - 4p)  Please contact [facility] for night-coverage after hours (4p -7a ) and weekends on [email or web address]

## 2019-08-31 NOTE — Progress Notes (Signed)
Pt has had black watery stool from rectal tube, emptied the stool bag to day got 300 ml black watery stool, She also has minimal blood clot from rectal and vaginal bleeding, and some blood clot in her oral mucosa and her gum when we cleaned her mount with soft tooth brush and mouth wash. Pharmacist made aware and agreed that we will keep her Heparin at 1000 units/hr and recommened Lactulose should be on hold.  Pt appeared alert, awake, forgetful, with some confusion, disoriented to time and situations, she pulled her midline and her EKG electrode tonight. After reoriented, she was more compliant and followed commands.   Her vital remained stable and no acute distress note. EKG was sinus rhythm with 1st degree AV block on monitor. HR 80s, Room air SPO2 96-100%,CVP was 3-4 mmHg.  Continue to monitor.  Kennyth Lose, RN

## 2019-08-31 NOTE — Progress Notes (Signed)
*  PRELIMINARY RESULTS* Echocardiogram 2D Echocardiogram has been performed.  Leavy Cella 08/31/2019, 2:33 PM

## 2019-09-01 DIAGNOSIS — I5023 Acute on chronic systolic (congestive) heart failure: Secondary | ICD-10-CM | POA: Diagnosis present

## 2019-09-01 LAB — CBC WITH DIFFERENTIAL/PLATELET
Abs Immature Granulocytes: 0.08 10*3/uL — ABNORMAL HIGH (ref 0.00–0.07)
Basophils Absolute: 0.1 10*3/uL (ref 0.0–0.1)
Basophils Relative: 1 %
Eosinophils Absolute: 0.1 10*3/uL (ref 0.0–0.5)
Eosinophils Relative: 1 %
HCT: 24 % — ABNORMAL LOW (ref 36.0–46.0)
Hemoglobin: 8.5 g/dL — ABNORMAL LOW (ref 12.0–15.0)
Immature Granulocytes: 1 %
Lymphocytes Relative: 22 %
Lymphs Abs: 1.5 10*3/uL (ref 0.7–4.0)
MCH: 33.1 pg (ref 26.0–34.0)
MCHC: 35.4 g/dL (ref 30.0–36.0)
MCV: 93.4 fL (ref 80.0–100.0)
Monocytes Absolute: 0.4 10*3/uL (ref 0.1–1.0)
Monocytes Relative: 6 %
Neutro Abs: 4.7 10*3/uL (ref 1.7–7.7)
Neutrophils Relative %: 69 %
Platelets: 209 10*3/uL (ref 150–400)
RBC: 2.57 MIL/uL — ABNORMAL LOW (ref 3.87–5.11)
RDW: 18 % — ABNORMAL HIGH (ref 11.5–15.5)
WBC: 6.8 10*3/uL (ref 4.0–10.5)
nRBC: 0 % (ref 0.0–0.2)

## 2019-09-01 LAB — COMPREHENSIVE METABOLIC PANEL
ALT: 29 U/L (ref 0–44)
AST: 37 U/L (ref 15–41)
Albumin: 1.2 g/dL — ABNORMAL LOW (ref 3.5–5.0)
Alkaline Phosphatase: 103 U/L (ref 38–126)
Anion gap: 7 (ref 5–15)
BUN: 18 mg/dL (ref 6–20)
CO2: 22 mmol/L (ref 22–32)
Calcium: 7.1 mg/dL — ABNORMAL LOW (ref 8.9–10.3)
Chloride: 106 mmol/L (ref 98–111)
Creatinine, Ser: 0.79 mg/dL (ref 0.44–1.00)
GFR calc Af Amer: >60 mL/min (ref >60)
GFR calc non Af Amer: >60 mL/min (ref >60)
Glucose, Bld: 73 mg/dL (ref 70–99)
Potassium: 3.4 mmol/L — ABNORMAL LOW (ref 3.5–5.1)
Sodium: 135 mmol/L (ref 135–145)
Total Bilirubin: 2.9 mg/dL — ABNORMAL HIGH (ref 0.3–1.2)
Total Protein: 4.6 g/dL — ABNORMAL LOW (ref 6.5–8.1)

## 2019-09-01 LAB — HEPARIN LEVEL (UNFRACTIONATED)
Heparin Unfractionated: <0.1 IU/mL — ABNORMAL LOW (ref 0.30–0.70)
Heparin Unfractionated: <0.1 IU/mL — ABNORMAL LOW (ref 0.30–0.70)

## 2019-09-01 LAB — MAGNESIUM: Magnesium: 1.4 mg/dL — ABNORMAL LOW (ref 1.7–2.4)

## 2019-09-01 LAB — PROTIME-INR
INR: 1.9 — ABNORMAL HIGH (ref 0.8–1.2)
Prothrombin Time: 21.2 seconds — ABNORMAL HIGH (ref 11.4–15.2)

## 2019-09-01 LAB — COOXEMETRY PANEL
Carboxyhemoglobin: 2.1 % — ABNORMAL HIGH (ref 0.5–1.5)
Methemoglobin: 1 % (ref 0.0–1.5)
O2 Saturation: 88 %
Total hemoglobin: 9.5 g/dL — ABNORMAL LOW (ref 12.0–16.0)

## 2019-09-01 LAB — HEMOGLOBIN AND HEMATOCRIT, BLOOD
HCT: 28 % — ABNORMAL LOW (ref 36.0–46.0)
HCT: 27.3 % — ABNORMAL LOW (ref 36.0–46.0)
Hemoglobin: 9.6 g/dL — ABNORMAL LOW (ref 12.0–15.0)
Hemoglobin: 9.4 g/dL — ABNORMAL LOW (ref 12.0–15.0)

## 2019-09-01 MED ORDER — POTASSIUM CHLORIDE CRYS ER 20 MEQ PO TBCR
40.0000 meq | EXTENDED_RELEASE_TABLET | Freq: Once | ORAL | Status: AC
  Administered 2019-09-01: 10:00:00 40 meq via ORAL
  Filled 2019-09-01: qty 2

## 2019-09-01 MED ORDER — HEPARIN (PORCINE) 25000 UT/250ML-% IV SOLN: 10.0000 [IU]/kg/h | INTRAVENOUS | Status: DC

## 2019-09-01 MED ORDER — ASPIRIN 81 MG PO CHEW
81.0000 mg | CHEWABLE_TABLET | ORAL | Status: AC
  Administered 2019-09-02: 05:00:00 81 mg via ORAL
  Filled 2019-09-01: qty 1

## 2019-09-01 MED ORDER — ALBUMIN HUMAN 25 % IV SOLN
12.5000 g | Freq: Once | INTRAVENOUS | Status: AC
  Administered 2019-09-01: 12.5 g via INTRAVENOUS
  Filled 2019-09-01: qty 50

## 2019-09-01 MED ORDER — SODIUM CHLORIDE 0.9 % IV SOLN
INTRAVENOUS | Status: DC
  Administered 2019-09-02: 05:00:00 via INTRAVENOUS

## 2019-09-01 MED ORDER — HEPARIN (PORCINE) 25000 UT/250ML-% IV SOLN: 1100.0000 [IU]/h | INTRAVENOUS | Status: DC

## 2019-09-01 MED ORDER — MAGNESIUM SULFATE 4 GM/100ML IV SOLN
4.0000 g | Freq: Once | INTRAVENOUS | Status: AC
  Administered 2019-09-01: 16:00:00 4 g via INTRAVENOUS
  Filled 2019-09-01: qty 100

## 2019-09-01 MED ORDER — SODIUM CHLORIDE 0.9% FLUSH
3.0000 mL | Freq: Two times a day (BID) | INTRAVENOUS | Status: DC
  Administered 2019-09-01 – 2019-09-05 (×2): 3 mL via INTRAVENOUS

## 2019-09-01 MED ORDER — POTASSIUM CHLORIDE CRYS ER 20 MEQ PO TBCR
20.0000 meq | EXTENDED_RELEASE_TABLET | Freq: Once | ORAL | Status: AC
  Administered 2019-09-01: 20 meq via ORAL
  Filled 2019-09-01: qty 1

## 2019-09-01 MED ORDER — SODIUM CHLORIDE 0.9 % IV SOLN: 250.0000 mL | INTRAVENOUS | Status: DC | PRN

## 2019-09-01 MED ORDER — MAGNESIUM SULFATE 2 GM/50ML IV SOLN: 2.0000 g | Freq: Once | INTRAVENOUS | Status: DC

## 2019-09-01 MED ORDER — SODIUM CHLORIDE 0.9% FLUSH
3.0000 mL | INTRAVENOUS | Status: DC | PRN
  Administered 2019-09-01: 3 mL via INTRAVENOUS
  Filled 2019-09-01: qty 3

## 2019-09-01 NOTE — Progress Notes (Addendum)
[name] 11:18 AM  Subjective: Patient seen and examined in her hospital computer chart reviewed and her case discussed with my partner Dr. Penelope Coop and her previous endoscopy was reviewed and her case discussed with her nurse as well and she is unable to provide much history and her memory does not seem very good but she currently does not have any specific complaints and cannot say whether she is bleeding or not  Objective: Vital signs stable afebrile no acute distress abdomen is soft nontender hemoglobin slight drop without change in BUN  Assessment: Peptic ulcer disease  Plan: I will be on standby this week to help if needed please call me if question or problem from a GI standpoint otherwise care with aspirin and nonsteroidals going forward and continue pump inhibitors and slowly advance diet per primary team when you see fit and can transition to oral pump inhibitors per primary team as well  [facility]  office [phone] After 5PM or if no answer call [phone]

## 2019-09-01 NOTE — Progress Notes (Signed)
Pt's BP remained low, 80s/40-50s mmHg, compared and rechecked with manual BP cuff, the BP were accurate . Notified on all Provider, Jeannette Corpus , NP, got new order for 25% albumin 12.5 g IV after NSS bolus 500 ml x 1 dose. Her BP stayed the same between 80s/50s mmHg. No responded to fluid bolus, her CVP was 8-9.   CCMD called 4-5 times tonight to notify about frequent unsustained  V- Tach. Provider made aware. Will continue to monitor.  Kennyth Lose, RN

## 2019-09-01 NOTE — Progress Notes (Signed)
ANTICOAGULATION CONSULT NOTE - Follow Up Consult  Pharmacy Consult for Heparin Indication: DVT, LV thrombus, and acute CVA  Allergies  Allergen Reactions  . Penicillins Other (See Comments)    Unsure of reaction Did it involve swelling of the face/tongue/throat, SOB, or low BP? Unknown Did it involve sudden or severe rash/hives, skin peeling, or any reaction on the inside of your mouth or nose? Unknown Did you need to seek medical attention at a hospital or doctor's office? Unknown When did it last happen?[place] If all above answers are "NO", may proceed with cephalosporin use.    Patient Measurements: Height: 5\' 5"  (165.1 cm) Weight: 184 lb 15.5 oz (83.9 kg) IBW/kg (Calculated) : 57 Heparin Dosing Weight: 75 kg  Vital Signs: Temp: 97.5 F (36.4 C) (10/05 0400) Temp Source: Oral (10/05 0400) BP: 103/58 (10/05 0700) Pulse Rate: 71 (10/05 0700)  Labs: Recent Labs    08/29/19 2100 08/30/19 0410  08/30/19 2108 08/31/19 0318 08/31/19 0319 08/31/19 0839 08/31/19 1514 08/31/19 2227 09/01/19 0439 09/01/19 0440  HGB 8.7*  --    < >  --  10.7*  --  8.7* 9.3* 9.3* 8.5*  --   HCT 26.0*  --    < >  --  30.3*  --  25.7* 27.0* 26.5* 24.0*  --   PLT  --   --    < >  --  233  --  197  --   --  209  --   LABPROT 20.8*  --   --   --   --   --   --   --   --   --   --   INR 1.8*  --   --   --   --   --   --   --   --   --   --   HEPARINUNFRC  --  <0.10*   < > 0.14*  --  0.23*  --   --   --   --  <0.10*  CREATININE  --  0.80  --   --  0.78  --   --   --   --  0.79  --    < > = values in this interval not displayed.    Estimated Creatinine Clearance: 86 mL/min (by C-G formula based on SCr of 0.79 mg/dL).   Assessment: [name] on heparin for LV thrombus and bilateral lower extremity ischemia w/ right popliteal vein DVT now s/p angioplasty and R/L popliteal thrombectomy of the tibial artery. Heparin therapy complicated by acute CVA and recent GI bleed and need for lower goal.  Plans noted for coumadin when no further procedures   She is s/p mech thrombectomy of L popliteal 9/23. Heparin stopped 09/23 @ 1750 due to a GI bleed. Pt completed course of octreotide. Pt placed on IV PPI for treatment of ulcers. Heparin to continue due to LV thrombus per Dr. 10/23.  Heparin level undetectable this AM, Hgb back down to 8.5.  Per RN report, bleeding in rectal bag overnight.  Also with vaginal bleeding.   Goal of Therapy:  Heparin level 0.3-0.5 units/ml>>closer to 0.3 Monitor platelets by anticoagulation protocol: Yes   Plan:  -Discussed with Dr. Shirlee Latch, will hold IV heparin x 6 hrs.  If not bleeding, will resume at that point.  Thanks for allowing pharmacy to be a part of this patient's care.  Shirlee Latch, [facility] Clinical Pharmacist Phone [phone]  09/01/2019 8:14 AM

## 2019-09-01 NOTE — Progress Notes (Addendum)
Patient ID: Briana Andrews, female   DOB: 17-Jan-1966, 53 y.o.   MRN: [id number]     Advanced Heart Failure Rounding Note  PCP-Cardiologist: Tobias Alexander, MD   Subjective:    She remains off dobutamine, co-ox 88% today.  CVP 10 this morning, weight down with Lasix IV 187>>184 lb in past 24 hrs. Tbili down to 2.9.   She got 1 unit PRBCs 10/2, but Hgb trending back down from 10.7>>9.3>>8.5. Continues w/ dark stools. ? If old. S/p EGD (9/29): Bleeding gastric and duodenal ulcers treated in endoscopy suite with resolution of bleeding. No varices.  Also recent vaginal bleeding.  Heparin currently on hold.  16 beat run of VT on tele overnight.  She is alert, oriented.     Objective:   Weight Range: 83.9 kg Body mass index is 30.78 kg/m.   Vital Signs:   Temp:  [97.5 F (36.4 C)-98.2 F (36.8 C)] 97.5 F (36.4 C) (10/05 0400) Pulse Rate:  [67-91] 71 (10/05 0700) Resp:  [10-25] 17 (10/05 0700) BP: (73-168)/(45-100) 103/58 (10/05 0700) SpO2:  [83 %-100 %] 99 % (10/05 0700) Weight:  [83.9 kg] 83.9 kg (10/05 0400) Last BM Date: 09/01/19  Weight change: Filed Weights   08/30/19 0540 08/31/19 0247 09/01/19 0400  Weight: 88.3 kg 84.9 kg 83.9 kg    Intake/Output:   Intake/Output Summary (Last 24 hours) at 09/01/2019 0754 Last data filed at 09/01/2019 0600 Gross per 24 hour  Intake 2837.12 ml  Output 1300 ml  Net 1537.12 ml      Physical Exam    PHYSICAL EXAM: General:  ill/fatigue appearing middle aged WF No respiratory difficulty HEENT: normal Neck: right IJ cath present Carotids 2+ bilat; no bruits. No lymphadenopathy or thyromegaly appreciated. Cor: PMI nondisplaced. Regular rate & rhythm. No rubs, gallops or murmurs. Lungs: clear Abdomen: soft, nontender, nondistended. No hepatosplenomegaly. No bruits or masses. Good bowel sounds. Extremities: 2+ bilateral LE edema up to knees (CVP 10, albumin 1.2) Neuro: alert & oriented x 3, cranial nerves grossly intact. moves all  4 extremities w/o difficulty. Affect pleasant.    Telemetry  NSR 60s currently w/ NSVT on tele overnight, longest run 16 beats  personally checked.   Labs    CBC Recent Labs    08/30/19 0956  08/31/19 0839  08/31/19 2227 09/01/19 0439  WBC 8.7   < > 7.1  --   --  6.8  NEUTROABS 6.7  --   --   --   --  4.7  HGB 11.0*   < > 8.7*   < > 9.3* 8.5*  HCT 32.1*   < > 25.7*   < > 26.5* 24.0*  MCV 93.0   < > 94.5  --   --  93.4  PLT 216   < > 197  --   --  209   < > = values in this interval not displayed.   Basic Metabolic Panel Recent Labs    98/33/82 0318 09/01/19 0439  NA 133* 135  K 3.9 3.4*  CL 106 106  CO2 21* 22  GLUCOSE 66* 73  BUN 20 18  CREATININE 0.78 0.79  CALCIUM 7.1* 7.1*   Liver Function Tests Recent Labs    08/31/19 0318 09/01/19 0439  AST 53* 37  ALT 36 29  ALKPHOS 137* 103  BILITOT 3.5* 2.9*  PROT 4.8* 4.6*  ALBUMIN 1.0* 1.2*   No results for input(s): LIPASE, AMYLASE in the last 72 hours. Cardiac Enzymes No results  for input(s): CKTOTAL, CKMB, CKMBINDEX, TROPONINI in the last 72 hours.  BNP: BNP (last 3 results) No results for input(s): BNP in the last 8760 hours.  ProBNP (last 3 results) No results for input(s): PROBNP in the last 8760 hours.   D-Dimer No results for input(s): DDIMER in the last 72 hours. Hemoglobin A1C No results for input(s): HGBA1C in the last 72 hours. Fasting Lipid Panel No results for input(s): CHOL, HDL, LDLCALC, TRIG, CHOLHDL, LDLDIRECT in the last 72 hours. Thyroid Function Tests No results for input(s): TSH, T4TOTAL, T3FREE, THYROIDAB in the last 72 hours.  Invalid input(s): FREET3  Other results:   Imaging    No results found.   Medications:     Scheduled Medications: . chlorhexidine  15 mL Mouth/Throat BID  . Chlorhexidine Gluconate Cloth  6 each Topical Daily  . digoxin  0.125 mg Oral Daily  . feeding supplement  1 Container Oral TID BM  . folic acid  1 mg Oral Daily  . furosemide  40  mg Oral Daily  . gabapentin  200 mg Oral BID  . lactulose  30 g Per Tube Daily  . losartan  25 mg Oral BID  . mouth rinse  15 mL Mouth Rinse BID  . multivitamin with minerals  1 tablet Per Tube Daily  . nicotine  21 mg Transdermal Daily  . pantoprazole  40 mg Intravenous Q12H  . rifaximin  550 mg Oral BID  . sodium chloride flush  10 mL Intravenous Q12H  . sodium chloride flush  10 mL Intravenous Q12H  . spironolactone  25 mg Oral Daily  . thiamine  100 mg Oral Daily    Infusions: . sodium chloride Stopped (08/31/19 2344)    PRN Medications: sodium chloride, acetaminophen, albuterol, dextromethorphan-guaiFENesin, hydrALAZINE, ondansetron (ZOFRAN) IV, oxyCODONE, sodium chloride flush, white petrolatum   Assessment/Plan   1. Acute on chronic systolic CHF/cardiogenic shock: Echo with EF 15-20%, mild LV dilation, LV thrombus, mild-moderate MR, severely decreased RV systolic function. Cause of cardiomyopathy is uncertain, but she was a heavy smoker and ECG is suggestive of possible old ASMI. Ischemic cardiomyopathy is possible, alternatively could be due to ETOH abuse (>6 pack/day per husband) or stress cardiomyopathy with critical illness.  Lactate was elevated at admission.  Suspect cardiogenic shock with low BP, cannot rule out component of septic shock. Off dobutamine now with co-ox 88%.  CVP 10 today. Weight continues to trend down. Still w/ bilateral LEE but suspect 2/2 low albumin at 1.2.  - Continue Lasix 40 mg po daily.  Continue w/ Unna boots.  - Continue spironolactone 25 mg daily.  - Continue digoxin 0.125.  - Continue losartan 25 mg bid, creatinine stable.  May be able to transition to [facility] soon.  - Not candidate for advanced therapies with ETOH abuse/liver failure.  - Eventual diagnostic heart cath when bleeding settles down => recurrent drop in hgb, down from 10.7>>8.5. may need to postpone cath until tomorrow.  2. 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  Markedly elevated bilirubin initially with jaundice.  However liver imaging with [place] and CT has NOT shown cirrhosis or portal/hepatic vein thrombosis. Question has arisen whether this might possibly be due to primarily to RV failure.  Time course seems quite acute for RV failure.  Jaundice/icterus improving. Tbili down to 2.9.  - holding stain and other hepato-toxic agents  3. Encephalopathy: ?Cause.  NH3 has not been markedly high but certainly could be hepatic encephalopathy.  Shock likely played  a role.  - Mostly resolved, still w/ some confusion - Continue lactulose/rifaxamin per GI   4. AKI: Creatinine improved with inotropic support.  Suspect cardiorenal syndrome, also had contrast with LE angiograms so possible component of contrast nephropathy. Creatinine normalized.  5. LV thrombus: Possible cause of embolic event to popliteal arteries as well as subacute CVA.  Heparin gtt held with active GI bleeding initially, then restarted 9/24 but discontinued again 10/5 for recurrent bleeding. Will check f/u INR today.  6. PAD: Bilateral popliteal artery occlusions, suspect cardioembolic from LV thrombus.  Now s/p mechanical thrombectomy by vascular. Right foot still looks ischemic but dopplerable pulses.  7. 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  Per above, heparin now on hold given recurrent bleeding and worsening anemia.  8. Right popliteal vein DVT: heparin currently on hold for active bleeding. 9. GI bleeding: Upper GI bleeding, EGD with bleeding duodenal and gastric ulcer on 9/29 that were treated, no varices.  Ongoing melena in rectal bag but decreasing. GI thinks just residual old blood and not active bleeding.  She had 1 unit PRBCs on 9/30 and 1 unit on 10/2. hgb dropping 10.7>>8.  May need GI to re-evaluate  - Off octreotide, continue IV Protonix boluses.    10. ETOH abuse: Per husband, at least a 6 pack daily. Plans to quit.  11. ID: Concern for aspiration PNA. -  Completed course of ceftriaxone.  12. Vaginal bleeding: She had a significant amount yesterday.  RN reports no bleeding noted today. Per [facility], suspect she needs gynecology eval.  13. Deconditioning: Out of bed to chair, work with PT.   14. NSVT: 16 beat run overnight. K 3.4. will supplement and check Mg. Continue to monitor 15. Hypokalemia: K 3.4. Supplemental K ordered 40 mEq this am and 20 mEq PM. F/u BMP in am.   Length of Stay: [address] PA-C  09/01/2019, 7:54 AM  Advanced Heart Failure Team Pager [phone] (M-F; 7a - 4p)  Please contact [facility] for night-coverage after hours (4p -7a ) and weekends on [email or web address]  Patient seen with PA, agree with the above note.    Still with dark stool, ?old blood.  Hgb higher this afternoon at 9.5.  GI saw today and signed off.  Heparin gtt has been restarted.   BP stable today but soft, will not titrate her HF meds further at this time.   Try to mobilize.   ?Cath tomorrow for diagnostic purposes if she is stable.  Will discuss with her husband.   Loralie Champagne 09/01/2019 5:15 PM

## 2019-09-01 NOTE — Progress Notes (Signed)
ANTICOAGULATION CONSULT NOTE - Follow Up Consult  Pharmacy Consult for Heparin Indication: DVT, LV thrombus, and acute CVA  Allergies  Allergen Reactions  . Penicillins Other (See Comments)    Unsure of reaction Did it involve swelling of the face/tongue/throat, SOB, or low BP? Unknown Did it involve sudden or severe rash/hives, skin peeling, or any reaction on the inside of your mouth or nose? Unknown Did you need to seek medical attention at a hospital or doctor's office? Unknown When did it last happen?[place] If all above answers are "NO", may proceed with cephalosporin use.    Patient Measurements: Height: 5\' 5"  (165.1 cm) Weight: 184 lb 15.5 oz (83.9 kg) IBW/kg (Calculated) : 57 Heparin Dosing Weight: 75 kg  Vital Signs: Temp: 97.5 F (36.4 C) (10/05 0400) Temp Source: Oral (10/05 0400) BP: 103/58 (10/05 0700) Pulse Rate: 84 (10/05 1006)  Labs: Recent Labs    08/29/19 2100 08/30/19 0410  08/30/19 2108 08/31/19 0318 08/31/19 0319 08/31/19 0839 08/31/19 1514 08/31/19 2227 09/01/19 0439 09/01/19 0440 09/01/19 1038  HGB 8.7*  --    < >  --  10.7*  --  8.7* 9.3* 9.3* 8.5*  --   --   HCT 26.0*  --    < >  --  30.3*  --  25.7* 27.0* 26.5* 24.0*  --   --   PLT  --   --    < >  --  233  --  197  --   --  209  --   --   LABPROT 20.8*  --   --   --   --   --   --   --   --   --   --  21.2*  INR 1.8*  --   --   --   --   --   --   --   --   --   --  1.9*  HEPARINUNFRC  --  <0.10*   < > 0.14*  --  0.23*  --   --   --   --  <0.10*  --   CREATININE  --  0.80  --   --  0.78  --   --   --   --  0.79  --   --    < > = values in this interval not displayed.    Estimated Creatinine Clearance: 86 mL/min (by C-G formula based on SCr of 0.79 mg/dL).   Assessment: [name] on heparin for LV thrombus and bilateral lower extremity ischemia w/ right popliteal vein DVT now s/p angioplasty and R/L popliteal thrombectomy of the tibial artery. Heparin therapy complicated by  acute CVA and recent GI bleed and need for lower goal. Plans noted for coumadin when no further procedures   She is s/p mech thrombectomy of L popliteal 9/23. Heparin stopped 09/23 @ 1750 due to a GI bleed. Pt completed course of octreotide. Pt placed on IV PPI for treatment of ulcers. Heparin to continue due to LV thrombus per Dr. Aundra Dubin.  Heparin level undetectable this AM, Hgb back down to 8.5.  Per RN report, bleeding in rectal bag overnight.  Also with vaginal bleeding.   Goal of Therapy:  Heparin level 0.3-0.5 units/ml>>closer to 0.3 Monitor platelets by anticoagulation protocol: Yes   Plan:  -Not currently bleeding, will resume IV heparin at previous rate of 1000 units/hr. -Check heparin level in 6 hrs. -Daily heparin level and CBC.  Thanks for  allowing pharmacy to be a part of this patient's care.  Jenetta Downer, [facility] Clinical Pharmacist Phone [phone]  09/01/2019 2:29 PM

## 2019-09-01 NOTE — Progress Notes (Addendum)
At 19:59, CCMD called to notify Pt had unsustained short V tach and some remarked as unsustained Atrial fibrillation. Mostly her EKG was sinus rhythm on monitor, HR 70s -80s. Her BP 98/46 mmHg. Will continue to monitor.  Kennyth Lose, RN

## 2019-09-01 NOTE — Progress Notes (Signed)
Trend of her low blood glucose has been concerned. We randomly checked CBG 67 at 21:24. Apple juice given, rechecked at 22:53  CBG 90 mg/dl. Pt appeared arousable ,lethargy, followed commands, oriented x 3. Report from day shift RN Pt had low appetite with clear liquid diet. Will monitor.   Kennyth Lose, RN

## 2019-09-01 NOTE — Progress Notes (Signed)
PROGRESS NOTE  Briana Andrews VOZ:[id number] DOB: 02/03/1966 DOA: 08/15/2019 PCP: Patient, [name]  Brief History   53 yo alcoholic woman admitted with R frontal CVA, B popliteal artery occlusions, hepatic failure, GI bleeding. She was found to have new cardiomyopathy with an LV thrombus, cirrhosis, ARF. Underwent B LE thrombectomies, was treated with heparin, stabilized. Has required hemodynamic support w dobutamine. Received PRBC and was able to undergo EGD 9/29 that showed duodenal and gastric ulcers which were treated endoscopically. Remains on dobuta, PPI gtt, heparin.   On 08/29/2019 the patient developed bleeding from multiple sites including her mouth, urine, and rectum. Pharmacy had reduced the dose and goal heparin level earlier in the day. I discussed with pharmacy. The heparin will be held for one hour and then restarted at yet an even lower dose. When it was restarted the patient again had bleeding with continued decline in Hgb. It was again placed on hold on 08/31/2019. It has been restarted again this morning. Monitor.  Transthoracic Echocardiogram repeated on 08/31/2019. Persistent appearance of large left ventricular mural thrombus.  Consultants  . Palliative Care . PCCM . Heart Failure Team . Vascular Surgery . Cardiology  Procedures  . EGD/Epinephrine injection . Mechanical Intubation . Central Venous Catheter . Extubation . Arteriogram of right and left lower extremities . Mechanical thrombectomy of the left popliteal and posterior tibial artery using Penumbra CAT 6 device Mechanical thrombectomy of the right popliteal and posterior tibial artery using Penumbra CAT 6 device  Antibiotics   Anti-infectives (From admission, onward)   Start     Dose/Rate Route Frequency Ordered Stop   08/28/19 2200  rifaximin (XIFAXAN) tablet 550 mg     550 mg Oral 2 times daily 08/28/19 0934     08/26/19 1000  rifaximin (XIFAXAN) tablet 550 mg  Status:  Discontinued     550 mg Per  Tube 2 times daily 08/25/19 2113 08/28/19 0935   08/20/19 1800  cefTRIAXone (ROCEPHIN) 2 g in sodium chloride 0.9 % 100 mL IVPB  Status:  Discontinued     2 g 200 mL/hr over 30 Minutes Intravenous Every 24 hours 08/20/19 1600 08/23/19 0825   08/18/19 0700  levofloxacin (LEVAQUIN) tablet 750 mg  Status:  Discontinued     750 mg Oral Every 48 hours 08/17/19 1205 08/20/19 1600   08/16/19 1115  rifaximin (XIFAXAN) tablet 550 mg  Status:  Discontinued     550 mg Oral 2 times daily 08/16/19 1114 08/25/19 2113   08/16/19 0700  levofloxacin (LEVAQUIN) IVPB 750 mg  Status:  Discontinued     750 mg 100 mL/hr over 90 Minutes Intravenous Every 48 hours 08/16/19 0616 08/17/19 1205     Subjective  The patient is resting comfortably. No new complaints.  Objective   Vitals:  Vitals:   09/01/19 0700 09/01/19 1006  BP: (!) 103/58   Pulse: 71 84  Resp: 17   Temp:    SpO2: 99%    Exam:  Constitutional:  . The patient is awake, alert, and oriented x 3. No acute distress. Respiratory:  . No increased work of breathing. . No wheezes, rales, or rhonchi . No tactile fremitus Cardiovascular:  . Regular rate and rhythm . No murmurs, ectopy, or gallups. . No lateral PMI. No thrills. Abdomen:  . Abdomen is soft, non-tender, non-distended . No hernias, masses, or organomegaly . Normoactive bowel sounds.  Musculoskeletal:  . No cyanosis, clubbing, or edema Skin:  . No rashes, lesions, ulcers . palpation of  skin: no induration or nodules Neurologic:  . CN 2-12 intact . Sensation all 4 extremities intact Psychiatric:  . Mental status o Mood: Depressed, affect flat o Orientation to person, place, time  . judgment and insight appear intact  I have personally reviewed the following:   Today's Data  . Vitals, CBC, CMP  Scheduled Meds: . chlorhexidine  15 mL Mouth/Throat BID  . Chlorhexidine Gluconate Cloth  6 each Topical Daily  . digoxin  0.125 mg Oral Daily  . feeding supplement  1  Container Oral TID BM  . folic acid  1 mg Oral Daily  . furosemide  40 mg Oral Daily  . gabapentin  200 mg Oral BID  . lactulose  30 g Per Tube Daily  . losartan  25 mg Oral BID  . mouth rinse  15 mL Mouth Rinse BID  . multivitamin with minerals  1 tablet Per Tube Daily  . nicotine  21 mg Transdermal Daily  . pantoprazole  40 mg Intravenous Q12H  . potassium chloride  20 mEq Oral Once  . rifaximin  550 mg Oral BID  . sodium chloride flush  10 mL Intravenous Q12H  . sodium chloride flush  10 mL Intravenous Q12H  . spironolactone  25 mg Oral Daily  . thiamine  100 mg Oral Daily   Continuous Infusions: . sodium chloride Stopped (08/31/19 2344)    Principal Problem:   Cerebral embolism with cerebral infarction Active Problems:   Abnormal LFTs   Acute metabolic encephalopathy   SOB (shortness of breath)   AKI (acute kidney injury) (HCC)   Fall   Lactic acidosis   Back pain   Liver failure without hepatic coma (HCC)   Multifocal pneumonia   PVD (peripheral vascular disease) (HCC)   Left ventricular apical thrombus without MI (HCC)   Acute CHF (congestive heart failure) (HCC)   Jaundice   Shock circulatory (HCC)   Palliative care by specialist   Goals of care, counseling/discussion   Advanced care planning/counseling discussion   Acute upper GI bleed   Pressure injury of skin   GI bleed   LOS: 16 days   A & P  Biventricular failure some combination ischemic event vs stress-induced (from shock state, acidemia, ABLA, ?aspiration-induced sepsis). Dobutamine weaned to off.  The patient was successfully extubated after EGD yesterday and respiratory status is stable on 4L [place]. Eventually she will need LHC to evaluate for biventricular failure.  Hemorrhage from mouth, urine, and rectum per nursing: Pharmacy had reduced the dose and goal heparin level earlier in the day. I discussed with pharmacy. The heparin will be held for one hour and then restarted at yet an even lower dose.  Again this morning nursing is reporting a large amount of blood underneath the patient assumed to be from her rectum or possibly her vagina. Hemoglobin has dropped from 10.4 to 8.7 this morning. Cardiology aware, and states that pt continues to require heparin, although it may be held intermittently. GI also aware, but does not feel that any intervention is necessary. Heparin has been held since this morning. Restart this evening. Monitor hemoglobin.  LV clot on AC:  ASA 81 mg daily. Continue heparin drip. Monitor CBC.  GI Bleed: The patient presented with melena with suspicion for upper GI source, intermittent transfusion requirements. EGD demonstrated oozing 24mm pre-pyloric ulcer. This was injected with Epi. Hgb 7.5 overnight and received 1 unit PRBC's. Hemoglobin dropped to 7 again on 08/29/2019 after observation of bleeding in mouth,  in urine, and in stool. Heparin is held for one hour and dose reduced for the second time. The patient was transfused with 2 units PRBC's. Bleeding stopped overnight. Hemoglobin now stable. Monitor.  Alcoholism, hyperbilirubinemia, clinically she has cirrhosis although imaging features not c/w this; LFT pattern not c/w alcoholic hepatitis nor ischemic nor congestive. We will continue lactulose, rifaximin, thiamine and folate.  Encephalopathy: Combination from critical illness, hepatic, and CVA, still has asterixis, improving overall. Continue lactulose.  AKI: Improving related to initial shock, blood loss.  CVA: likely secondary to emboli from LV clot.  BL femoral artery occlusion s/p BL thrombectomy:  RLE does not look great but dopplerable pulses, vascular surgery aware, poor candidate for further procedures, monitor clinically.  Severe protein calorie malnutrition due to alcoholism: Nutrition Consult.  Rt sided pleural effusion without resp distress: likely due to hepatic hydrothorax vs. distrubutive from poor oncotic pressure. No indication to drain.  I have  seen and examined this patient myself. I have spent 32 minutes in her evaluation and care.  Dorothea Yow, DO [facility] Direct contact: see [email or web address]  7PM-7AM contact night coverage as above 09/01/2019, 1:55 PM  LOS: 12 days

## 2019-09-01 NOTE — Progress Notes (Signed)
ANTICOAGULATION CONSULT NOTE - Follow Up Consult  Pharmacy Consult for Heparin Indication: DVT, LV thrombus, and acute CVA  Allergies  Allergen Reactions  . Penicillins Other (See Comments)    Unsure of reaction Did it involve swelling of the face/tongue/throat, SOB, or low BP? Unknown Did it involve sudden or severe rash/hives, skin peeling, or any reaction on the inside of your mouth or nose? Unknown Did you need to seek medical attention at a hospital or doctor's office? Unknown When did it last happen?[place] If all above answers are "NO", may proceed with cephalosporin use.    Patient Measurements: Height: 5\' 5"  (165.1 cm) Weight: 184 lb 15.5 oz (83.9 kg) IBW/kg (Calculated) : 57 Heparin Dosing Weight: 75 kg  Vital Signs: Temp: 98.5 F (36.9 C) (10/05 1946) Temp Source: Oral (10/05 1946) BP: 95/61 (10/05 1946) Pulse Rate: 81 (10/05 1946)  Labs: Recent Labs    08/30/19 0410  08/31/19 0318 08/31/19 0319 08/31/19 5176  09/01/19 0439 09/01/19 0440 09/01/19 1038 09/01/19 1605 09/01/19 2100 09/01/19 2227  HGB  --    < > 10.7*  --  8.7*   < > 8.5*  --   --  9.6*  --  9.4*  HCT  --    < > 30.3*  --  25.7*   < > 24.0*  --   --  28.0*  --  27.3*  PLT  --    < > 233  --  197  --  209  --   --   --   --   --   LABPROT  --   --   --   --   --   --   --   --  21.2*  --   --   --   INR  --   --   --   --   --   --   --   --  1.9*  --   --   --   HEPARINUNFRC <0.10*   < >  --  0.23*  --   --   --  <0.10*  --   --  <0.10*  --   CREATININE 0.80  --  0.78  --   --   --  0.79  --   --   --   --   --    < > = values in this interval not displayed.    Estimated Creatinine Clearance: 86 mL/min (by C-G formula based on SCr of 0.79 mg/dL).   Assessment: [name] on heparin for LV thrombus and bilateral lower extremity ischemia w/ right popliteal vein DVT now s/p angioplasty and R/L popliteal thrombectomy of the tibial artery. Heparin therapy complicated by acute CVA and  recent GI bleed and need for lower goal. Plans noted for coumadin when no further procedures   She is s/p mech thrombectomy of L popliteal 9/23. Heparin stopped 09/23 @ 1750 due to a GI bleed. Pt completed course of octreotide. Pt placed on IV PPI for treatment of ulcers. Heparin to continue due to LV thrombus per Dr. Aundra Dubin.  Heparin level remains undetectable after heparin infusion restarted at 1538, Hgb back down to 8.5- now up to 9.4. Per RN report, bleeding seems stable to possible decreased over the day. Heparin infusing in IJ in neck, level drawn centrally.   Goal of Therapy:  Heparin level 0.3-0.5 units/ml>>closer to 0.3 Monitor platelets by anticoagulation protocol: Yes   Plan:  -Increase IV  heparin at previous rate of 1100 units/hr. -Check heparin level in 6 hrs. -Daily heparin level and CBC.  Thanks for allowing pharmacy to be a part of this patient's care.  Sherron Monday, PharmD, BCCCP Clinical Pharmacist   Please check AMION for all [facility] phone numbers After 10:00 PM, call Main Pharmacy [phone]  09/01/2019 11:45 PM

## 2019-09-02 ENCOUNTER — Encounter (HOSPITAL_COMMUNITY)
Admission: EM | Disposition: A | Discharge status: Skilled Nursing Facility | Payer: Self-pay | Source: Home / Self Care | Attending: Internal Medicine

## 2019-09-02 ENCOUNTER — Encounter (HOSPITAL_COMMUNITY): Payer: Self-pay | Admitting: Cardiology

## 2019-09-02 DIAGNOSIS — I2510 Atherosclerotic heart disease of native coronary artery without angina pectoris: Secondary | ICD-10-CM

## 2019-09-02 HISTORY — PX: RIGHT/LEFT HEART CATH AND CORONARY ANGIOGRAPHY: CATH118266

## 2019-09-02 LAB — CBC WITH DIFFERENTIAL/PLATELET
Abs Immature Granulocytes: 0.05 10*3/uL (ref 0.00–0.07)
Basophils Absolute: 0.1 10*3/uL (ref 0.0–0.1)
Basophils Relative: 1 %
Eosinophils Absolute: 0.1 10*3/uL (ref 0.0–0.5)
Eosinophils Relative: 1 %
HCT: 29 % — ABNORMAL LOW (ref 36.0–46.0)
Hemoglobin: 9.8 g/dL — ABNORMAL LOW (ref 12.0–15.0)
Immature Granulocytes: 1 %
Lymphocytes Relative: 21 %
Lymphs Abs: 1.5 10*3/uL (ref 0.7–4.0)
MCH: 32 pg (ref 26.0–34.0)
MCHC: 33.8 g/dL (ref 30.0–36.0)
MCV: 94.8 fL (ref 80.0–100.0)
Monocytes Absolute: 0.6 10*3/uL (ref 0.1–1.0)
Monocytes Relative: 8 %
Neutro Abs: 4.7 10*3/uL (ref 1.7–7.7)
Neutrophils Relative %: 68 %
Platelets: 231 10*3/uL (ref 150–400)
RBC: 3.06 MIL/uL — ABNORMAL LOW (ref 3.87–5.11)
RDW: 18.6 % — ABNORMAL HIGH (ref 11.5–15.5)
WBC: 6.9 10*3/uL (ref 4.0–10.5)
nRBC: 0 % (ref 0.0–0.2)

## 2019-09-02 LAB — COMPREHENSIVE METABOLIC PANEL
ALT: 33 U/L (ref 0–44)
AST: 41 U/L (ref 15–41)
Albumin: 1.3 g/dL — ABNORMAL LOW (ref 3.5–5.0)
Alkaline Phosphatase: 111 U/L (ref 38–126)
Anion gap: 9 (ref 5–15)
BUN: 12 mg/dL (ref 6–20)
CO2: 23 mmol/L (ref 22–32)
Calcium: 7.5 mg/dL — ABNORMAL LOW (ref 8.9–10.3)
Chloride: 103 mmol/L (ref 98–111)
Creatinine, Ser: 0.82 mg/dL (ref 0.44–1.00)
GFR calc Af Amer: >60 mL/min (ref >60)
GFR calc non Af Amer: >60 mL/min (ref >60)
Glucose, Bld: 76 mg/dL (ref 70–99)
Potassium: 3.8 mmol/L (ref 3.5–5.1)
Sodium: 135 mmol/L (ref 135–145)
Total Bilirubin: 3.4 mg/dL — ABNORMAL HIGH (ref 0.3–1.2)
Total Protein: 4.9 g/dL — ABNORMAL LOW (ref 6.5–8.1)

## 2019-09-02 LAB — POCT I-STAT EG7
Bicarbonate: 25 mmol/L (ref 20.0–28.0)
Bicarbonate: 25.6 mmol/L (ref 20.0–28.0)
Calcium, Ion: 1.14 mmol/L — ABNORMAL LOW (ref 1.15–1.40)
Calcium, Ion: 1.21 mmol/L (ref 1.15–1.40)
HCT: 27 % — ABNORMAL LOW (ref 36.0–46.0)
HCT: 28 % — ABNORMAL LOW (ref 36.0–46.0)
Hemoglobin: 9.2 g/dL — ABNORMAL LOW (ref 12.0–15.0)
Hemoglobin: 9.5 g/dL — ABNORMAL LOW (ref 12.0–15.0)
O2 Saturation: 72 %
O2 Saturation: 74 %
Potassium: 3.7 mmol/L (ref 3.5–5.1)
Potassium: 3.8 mmol/L (ref 3.5–5.1)
Sodium: 138 mmol/L (ref 135–145)
Sodium: 140 mmol/L (ref 135–145)
TCO2: 26 mmol/L (ref 22–32)
TCO2: 27 mmol/L (ref 22–32)
pCO2, Ven: 43.3 mmHg — ABNORMAL LOW (ref 44.0–60.0)
pCO2, Ven: 44.6 mmHg (ref 44.0–60.0)
pH, Ven: 7.366 (ref 7.250–7.430)
pH, Ven: 7.37 (ref 7.250–7.430)
pO2, Ven: 39 mmHg (ref 32.0–45.0)
pO2, Ven: 41 mmHg (ref 32.0–45.0)

## 2019-09-02 LAB — COOXEMETRY PANEL
Carboxyhemoglobin: 1.7 % — ABNORMAL HIGH (ref 0.5–1.5)
Methemoglobin: 0.4 % (ref 0.0–1.5)
O2 Saturation: 65.6 %
Total hemoglobin: 9 g/dL — ABNORMAL LOW (ref 12.0–16.0)

## 2019-09-02 LAB — MAGNESIUM: Magnesium: 1.9 mg/dL (ref 1.7–2.4)

## 2019-09-02 LAB — HEMOGLOBIN AND HEMATOCRIT, BLOOD
HCT: 26.7 % — ABNORMAL LOW (ref 36.0–46.0)
HCT: 27.1 % — ABNORMAL LOW (ref 36.0–46.0)
Hemoglobin: 9.1 g/dL — ABNORMAL LOW (ref 12.0–15.0)
Hemoglobin: 8.8 g/dL — ABNORMAL LOW (ref 12.0–15.0)

## 2019-09-02 LAB — HEPARIN LEVEL (UNFRACTIONATED): Heparin Unfractionated: 0.32 IU/mL (ref 0.30–0.70)

## 2019-09-02 SURGERY — RIGHT/LEFT HEART CATH AND CORONARY ANGIOGRAPHY
Anesthesia: LOCAL

## 2019-09-02 MED ORDER — LABETALOL HCL 5 MG/ML IV SOLN: 10.0000 mg | INTRAVENOUS | Status: AC | PRN

## 2019-09-02 MED ORDER — LIDOCAINE HCL (PF) 1 % IJ SOLN
INTRAMUSCULAR | Status: DC | PRN
  Administered 2019-09-02 (×2): 2 mL

## 2019-09-02 MED ORDER — VERAPAMIL HCL 2.5 MG/ML IV SOLN
INTRAVENOUS | Status: AC
  Filled 2019-09-02: qty 2

## 2019-09-02 MED ORDER — FENTANYL CITRATE (PF) 100 MCG/2ML IJ SOLN
INTRAMUSCULAR | Status: DC | PRN
  Administered 2019-09-02: 25 ug via INTRAVENOUS

## 2019-09-02 MED ORDER — SODIUM CHLORIDE 0.9 % IV SOLN: INTRAVENOUS | Status: AC

## 2019-09-02 MED ORDER — FENTANYL CITRATE (PF) 100 MCG/2ML IJ SOLN
INTRAMUSCULAR | Status: AC
  Filled 2019-09-02: qty 2

## 2019-09-02 MED ORDER — HEPARIN SODIUM (PORCINE) 1000 UNIT/ML IJ SOLN
INTRAMUSCULAR | Status: DC | PRN
  Administered 2019-09-02: 4000 [IU] via INTRAVENOUS

## 2019-09-02 MED ORDER — WARFARIN - PHARMACIST DOSING INPATIENT
Freq: Every day | Status: DC
  Administered 2019-09-02: 17:00:00

## 2019-09-02 MED ORDER — HEPARIN (PORCINE) 25000 UT/250ML-% IV SOLN
1100.0000 [IU]/h | INTRAVENOUS | Status: DC
  Administered 2019-09-02: 1100 [IU]/h via INTRAVENOUS
  Filled 2019-09-02: qty 250

## 2019-09-02 MED ORDER — HYDRALAZINE HCL 20 MG/ML IJ SOLN: 10.0000 mg | INTRAMUSCULAR | Status: AC | PRN

## 2019-09-02 MED ORDER — SODIUM CHLORIDE 0.9% FLUSH
3.0000 mL | INTRAVENOUS | Status: DC | PRN
  Administered 2019-09-02 – 2019-09-05 (×4): 3 mL via INTRAVENOUS
  Filled 2019-09-02 (×4): qty 3

## 2019-09-02 MED ORDER — ONDANSETRON HCL 4 MG/2ML IJ SOLN: 4.0000 mg | Freq: Four times a day (QID) | INTRAMUSCULAR | Status: DC | PRN

## 2019-09-02 MED ORDER — HEPARIN SODIUM (PORCINE) 1000 UNIT/ML IJ SOLN
INTRAMUSCULAR | Status: AC
  Filled 2019-09-02: qty 1

## 2019-09-02 MED ORDER — VERAPAMIL HCL 2.5 MG/ML IV SOLN
INTRAVENOUS | Status: DC | PRN
  Administered 2019-09-02: 10:00:00 10 mL via INTRA_ARTERIAL

## 2019-09-02 MED ORDER — ACETAMINOPHEN 325 MG PO TABS
650.0000 mg | ORAL_TABLET | ORAL | Status: DC | PRN
  Administered 2019-09-05 – 2019-09-12 (×5): 650 mg via ORAL
  Filled 2019-09-02 (×5): qty 2

## 2019-09-02 MED ORDER — MIDAZOLAM HCL 2 MG/2ML IJ SOLN
INTRAMUSCULAR | Status: AC
  Filled 2019-09-02: qty 2

## 2019-09-02 MED ORDER — WARFARIN SODIUM 2 MG PO TABS
2.0000 mg | ORAL_TABLET | Freq: Once | ORAL | Status: AC
  Administered 2019-09-02: 17:00:00 2 mg via ORAL
  Filled 2019-09-02: qty 1

## 2019-09-02 MED ORDER — LIDOCAINE HCL (PF) 1 % IJ SOLN
INTRAMUSCULAR | Status: AC
  Filled 2019-09-02: qty 30

## 2019-09-02 MED ORDER — HEPARIN (PORCINE) IN NACL 1000-0.9 UT/500ML-% IV SOLN
INTRAVENOUS | Status: AC
  Filled 2019-09-02: qty 1000

## 2019-09-02 MED ORDER — HEPARIN (PORCINE) IN NACL 1000-0.9 UT/500ML-% IV SOLN
INTRAVENOUS | Status: DC | PRN
  Administered 2019-09-02 (×2): 500 mL

## 2019-09-02 MED ORDER — SODIUM CHLORIDE 0.9 % IV SOLN: 250.0000 mL | INTRAVENOUS | Status: DC | PRN

## 2019-09-02 MED ORDER — MIDAZOLAM HCL 2 MG/2ML IJ SOLN
INTRAMUSCULAR | Status: DC | PRN
  Administered 2019-09-02: 1 mg via INTRAVENOUS

## 2019-09-02 MED ORDER — IOHEXOL 350 MG/ML SOLN
INTRAVENOUS | Status: DC | PRN
  Administered 2019-09-02: 11:00:00 55 mL

## 2019-09-02 MED ORDER — SODIUM CHLORIDE 0.9% FLUSH
3.0000 mL | Freq: Two times a day (BID) | INTRAVENOUS | Status: DC
  Administered 2019-09-05: 3 mL via INTRAVENOUS

## 2019-09-02 SURGICAL SUPPLY — 11 items
CATH 5FR JL3.5 JR4 ANG PIG MP (CATHETERS) ×1 IMPLANT
CATH BALLN WEDGE 5F 110CM (CATHETERS) ×1 IMPLANT
DEVICE RAD COMP TR BAND LRG (VASCULAR PRODUCTS) ×1 IMPLANT
GLIDESHEATH SLEND SS 6F .021 (SHEATH) ×1 IMPLANT
GUIDEWIRE INQWIRE 1.5J.035X260 (WIRE) IMPLANT
HOVERMATT SINGLE USE (MISCELLANEOUS) ×1 IMPLANT
INQWIRE 1.5J .035X260CM (WIRE) ×2
KIT HEART LEFT (KITS) ×1 IMPLANT
PACK CARDIAC CATHETERIZATION (CUSTOM PROCEDURE TRAY) ×2 IMPLANT
SHEATH GLIDE SLENDER 4/5FR (SHEATH) ×1 IMPLANT
TRANSDUCER W/STOPCOCK (MISCELLANEOUS) ×2 IMPLANT

## 2019-09-02 NOTE — Progress Notes (Signed)
Nutrition Follow-up  DOCUMENTATION CODES:   Not applicable  INTERVENTION:    Boost Breeze po TID, each supplement provides 250 kcal and 9 grams of protein  MVI with minerals   Once diet advanced- Ensure Enlive po TID, each supplement provides 350 kcal and 20 grams of protein  NUTRITION DIAGNOSIS:   Moderate Malnutrition related to social / environmental circumstances(EtOH abuse) as evidenced by mild fat depletion, moderate fat depletion, mild muscle depletion, moderate muscle depletion.  Ongoing  GOAL:   Patient will meet greater than or equal to 90% of their needs  Not meeting at this time  MONITOR:   Diet advancement, Labs, PO intake, Weight trends, I & O's, Skin  REASON FOR ASSESSMENT:   Low Braden    ASSESSMENT:   53 year old female who presented to the ED on 9/18 with generalized weakness, AMS, and SOB. PMH of tobacco abuse, EtOH abuse. Pt admitted with acute liver failure and multifocal pneumonia. Infarct seen on MRI. CT abdomen showing cardiomegaly, moderate right-sided pleural effusion, and aortic atherosclerosis. Pt now with newly diagnosed biventricular CHF.   9/22 - s/p aortogram, right popliteal artery occlusion treated with thrombectomy 9/23 - left popliteal artery occlusion treated with thrombectomy, transferred to ICU due to hypotension 9/28- intubated for EGD 9/29- EGD reveals bleeding duodenal bulb ulcer, stopped with epi, extubated  10/6- s/p R/L heart cath   Pt drowsy after procedure. Unable to answer RD questions. Spoke with RN who reports pt's appetite fluctuates. Meal completions charted as 10-100% for her last 6 meals. She is drinking Boost 1-2 times daily. Remains on clears s/p procedure. Will continue with clear supplement and transition to higher protein supplement once diet advanced.   Admission weight: 81.6 kg Current weight: 83.9 kg   I/O: +91 ml since 9/22 UOP: 3,600 ml x 24 hrs   Medications: folic acid, 40 mg lasix daily, lactulose,  MVI with minerals, aldactone, thiamine Labs: CBG 66-95   Diet Order:   Diet Order            Diet clear liquid Room service appropriate? Yes; Fluid consistency: Thin  Diet effective now              EDUCATION NEEDS:   Not appropriate for education at this time  Skin:  Skin Assessment: Skin Integrity Issues: Skin Integrity Issues:: Stage I, Other (Comment) Stage I: left heel, right heel Other: blistered area to right foot, right leg and non-pressure wound to right pretibial  Last BM:  10/6  Height:   Ht Readings from Last 1 Encounters:  08/26/19 5\' 5"  (1.651 m)    Weight:   Wt Readings from Last 1 Encounters:  09/02/19 83.9 kg    Ideal Body Weight:  56.8 kg  BMI:  Body mass index is 30.78 kg/m.  Estimated Nutritional Needs:   Kcal:  1850-2050 kcal  Protein:  90-105 grams  Fluid:  >/= 1.8 L/day   Mariana Single RD, LDN Clinical Nutrition Pager # [phone]

## 2019-09-02 NOTE — Progress Notes (Signed)
SLP Cancellation Note  Patient Details Name: Briana Andrews MRN: [id number] DOB: 21-Jan-1966   Cancelled treatment:       Reason Eval/Treat Not Completed: Patient at procedure or test/unavailable   Venita Sheffield [name] 09/02/2019, 9:44 AM  Pollyann Glen, M.A. [place] Acute Environmental education officer [phone] Office [phone]

## 2019-09-02 NOTE — Progress Notes (Addendum)
ANTICOAGULATION CONSULT NOTE - Follow Up Consult  Pharmacy Consult for Heparin> warfarin Indication: DVT, LV thrombus, and acute CVA  Allergies  Allergen Reactions  . Penicillins Other (See Comments)    Unsure of reaction Did it involve swelling of the face/tongue/throat, SOB, or low BP? Unknown Did it involve sudden or severe rash/hives, skin peeling, or any reaction on the inside of your mouth or nose? Unknown Did you need to seek medical attention at a hospital or doctor's office? Unknown When did it last happen?[place] If all above answers are "NO", may proceed with cephalosporin use.    Patient Measurements: Height: 5\' 5"  (165.1 cm) Weight: 184 lb 15.5 oz (83.9 kg) IBW/kg (Calculated) : 57 Heparin Dosing Weight: 75 kg  Vital Signs: Temp: 98.7 F (37.1 C) (10/06 0824) Temp Source: Oral (10/06 0824) BP: 102/48 (10/06 0824) Pulse Rate: 82 (10/06 0516)  Labs: Recent Labs    08/31/19 0318  08/31/19 0839  09/01/19 0439 09/01/19 0440 09/01/19 1038 09/01/19 1605 09/01/19 2100 09/01/19 2227 09/02/19 0526  HGB 10.7*  --  8.7*   < > 8.5*  --   --  9.6*  --  9.4* 9.8*  HCT 30.3*  --  25.7*   < > 24.0*  --   --  28.0*  --  27.3* 29.0*  PLT 233  --  197  --  209  --   --   --   --   --  231  LABPROT  --   --   --   --   --   --  21.2*  --   --   --   --   INR  --   --   --   --   --   --  1.9*  --   --   --   --   HEPARINUNFRC  --    < >  --   --   --  <0.10*  --   --  <0.10*  --  0.32  CREATININE 0.78  --   --   --  0.79  --   --   --   --   --  0.82   < > = values in this interval not displayed.    Estimated Creatinine Clearance: 83.9 mL/min (by C-G formula based on SCr of 0.82 mg/dL).   Assessment: [name] on heparin for LV thrombus and bilateral lower extremity ischemia w/ right popliteal vein DVT now s/p angioplasty and R/L popliteal thrombectomy of the tibial artery. Heparin therapy complicated by acute CVA and recent GI bleed and need for lower goal. Plans  noted for coumadin when no further procedures   She is s/p mech thrombectomy of L popliteal 9/23. Heparin stopped 09/23 @ 1750 due to a GI bleed. Pt completed course of octreotide. Pt placed on IV PPI for treatment of ulcers. Heparin to continue due to LV thrombus per Dr. Aundra Dubin.  Heparin drip 1100 uts/hr HL 0.32 at goal no change, no bleeding noted - plan cath today INR baseline 1.9 will need low dose warfarin after cath   S/p cath NICM, CAD no intervention  Goal of Therapy:  Heparin level 0.3-0.5 units/ml>>closer to 0.3  INR 2-3 Monitor platelets by anticoagulation protocol: Yes   Plan: Restart  IV heparin at 1100 units/hr. At 8pm  -Daily heparin level Protime and CBC. Warfarin 2mg  x1 tonight after cath  Bonnita Nasuti Pharm.D. CPP, BCPS Clinical Pharmacist [phone] 09/02/2019 8:42 AM

## 2019-09-02 NOTE — Progress Notes (Signed)
Inpatient Rehabilitation Admissions Coordinator  I signed off this case 08/21/2019. I recommend SNF level rehab when medically ready for d/c. Will need prolonged rehab course.  Danne Baxter, RN, MSN Rehab Admissions Coordinator [phone] 09/02/2019 2:41 PM

## 2019-09-02 NOTE — Interval H&P Note (Signed)
History and Physical Interval Note:  09/02/2019 9:49 AM  Briana Andrews  has presented today for surgery, with the diagnosis of heart failure.  The various methods of treatment have been discussed with the patient and family. After consideration of risks, benefits and other options for treatment, the patient has consented to  Procedure(s): RIGHT/LEFT HEART CATH AND CORONARY ANGIOGRAPHY (N/A) as a surgical intervention.  The patient's history has been reviewed, patient examined, no change in status, stable for surgery.  I have reviewed the patient's chart and labs.  Questions were answered to the patient's satisfaction.     Sparrow Sanzo [employer]

## 2019-09-02 NOTE — Progress Notes (Signed)
Occupational Therapy Treatment Patient Details Name: Briana Andrews MRN: [id number] DOB: [birth year] Today's Date: 09/02/2019    History of present illness 53 year old female with history of smoking, alcohol use, she drinks more than 6 packs of beer every day since last 20 years, no chronic medical issues brought to the emergency room with confusion more than usual and falls.  Since last few months there have been noticing some changes on her including weakness on her legs and poor appetite as much as she stopped smoking and decreased her drinking.  2 weeks ago, she was taken to [facility] with a fall, skeletal survey was negative except L1 fracture with 25% height loss and discharged home on muscle relaxants.  Patient has been intermittently confused since then. In the emergency room, she was found with subacute right ACA stroke, jaundice with bilirubin of 12, bilateral multifocal pneumonia and metabolic encephalopathy.s/p cardiac cath 10/6. Echo 10/4- LV thrombus.   OT comments  Pt supine in bed, asleep.  Noted R UE bleeding from radial cath site and RN notified.  Pt agreeable to OT/PT, lethargic throughout session.  Mod assist +2 for bed mobility, multimodal cueing required to initiate and sequence with cueing to avoid use of R UE (since cath) today. Setup for grooming at EOB, good sitting balance but fatigues easily. Continues to require increased time, limited by cognition, pain and lethargy.  CIR remains appropriate.    Follow Up Recommendations  CIR;Supervision/Assistance - 24 hour    Equipment Recommendations  Other (comment)(defer to next venue of care)    Recommendations for Other Services      Precautions / Restrictions Precautions Precautions: Fall Precaution Comments: flexiseal, foley, wound on Rt foot, bleeding Restrictions Weight Bearing Restrictions: No       Mobility Bed Mobility Overal bed mobility: Needs Assistance Bed Mobility: Rolling;Sidelying to  Sit;Sit to Supine Rolling: Min assist Sidelying to sit: Mod assist;+2 for physical assistance;HOB elevated   Sit to supine: Max assist;+2 for physical assistance   General bed mobility comments: requires cueing for technique, mulitmodal cueing to initate and sequence tasks and max cueing to refrain from using R UE (radial access from cath); phyiscal assist for trunk support to EOB and trunk/LE support to supine  Transfers Overall transfer level: Needs assistance Equipment used: 2 person hand held assist Transfers: Sit to/from Stand Sit to Stand: Mod assist;+2 physical assistance         General transfer comment: Assist of 2 to power to standing, able to get half way up, removed bloody chuck pad. good initiation. Wound on right foot to favored LLE.    Balance Overall balance assessment: Needs assistance Sitting-balance support: Feet supported;No upper extremity supported Sitting balance-Leahy Scale: Fair Sitting balance - Comments: supervision for safety   Standing balance support: During functional activity Standing balance-Leahy Scale: Poor Standing balance comment: Stood half way up with assist of 2.                           ADL either performed or assessed with clinical judgement   ADL Overall ADL's : Needs assistance/impaired     Grooming: Wash/dry face;Set up;Sitting                               Functional mobility during ADLs: Moderate assistance;+2 for physical assistance;Cueing for sequencing General ADL Comments: pt limited by lethargy, pain, cognition, decreased activity tolerance  Vision   Additional Comments: further assessment needed, d/t cogntion   Perception     Praxis      Cognition Arousal/Alertness: Lethargic Behavior During Therapy: Flat affect Overall Cognitive Status: No family/caregiver present to determine baseline cognitive functioning Area of Impairment: Following commands;Problem solving;Awareness                      Memory: Decreased short-term memory Following Commands: Follows one step commands with increased time;Follows multi-step commands inconsistently   Awareness: Intellectual Problem Solving: Slow processing;Decreased initiation;Difficulty sequencing;Requires verbal cues;Requires tactile cues General Comments: Pt with slow processing and delayed response time. Needs step by step cues for technique. Oriented to month and year but was not able to state what procedure she had today.        Exercises     Shoulder Instructions       General Comments Right foot bandaged and wrapped.  BP supine 105/70, BP sitting EOB 113/79.    Pertinent Vitals/ Pain       Pain Assessment: Faces Faces Pain Scale: Hurts whole lot Pain Location: bottom, everywhere Pain Descriptors / Indicators: Grimacing;Moaning Pain Intervention(s): Monitored during session;Repositioned;Limited activity within patient's tolerance  Home Living                                          Prior Functioning/Environment              Frequency  Min 2X/week        Progress Toward Goals  OT Goals(current goals can now be found in the care plan section)  Progress towards OT goals: Progressing toward goals  Acute Rehab OT Goals Patient Stated Goal: none stated OT Goal Formulation: With patient  Plan Discharge plan remains appropriate;Frequency remains appropriate    Co-evaluation    PT/OT/SLP Co-Evaluation/Treatment: Yes Reason for Co-Treatment: For patient/therapist safety;To address functional/ADL transfers;Necessary to address cognition/behavior during functional activity PT goals addressed during session: Mobility/safety with mobility OT goals addressed during session: ADL's and self-care      AM-PAC OT "6 Clicks" Daily Activity     Outcome Measure   Help from another person eating meals?: None Help from another person taking care of personal grooming?: A Little Help  from another person toileting, which includes using toliet, bedpan, or urinal?: A Lot Help from another person bathing (including washing, rinsing, drying)?: A Lot Help from another person to put on and taking off regular upper body clothing?: A Little Help from another person to put on and taking off regular lower body clothing?: A Lot 6 Click Score: 16    End of Session Equipment Utilized During Treatment: Oxygen  OT Visit Diagnosis: Unsteadiness on feet (R26.81);Muscle weakness (generalized) (M62.81);Other symptoms and signs involving cognitive function;Pain Pain - part of body: (generalized)   Activity Tolerance Patient limited by lethargy   Patient Left with call bell/phone within reach;in bed;with bed alarm set   Nurse Communication Mobility status        Time: 8315-1761 OT Time Calculation (min): 23 min  Charges: OT General Charges $OT Visit: 1 Visit OT Treatments $Self Care/Home Management : 8-22 mins  Chancy Milroy, OT Acute Rehabilitation Services Pager [phone] Office [phone]    Chancy Milroy 09/02/2019, 1:53 PM

## 2019-09-02 NOTE — H&P (View-Only) (Signed)
Patient ID: Briana Andrews, female   DOB: [birth year], 53 y.o.   MRN: [id number]     Advanced Heart Failure Rounding Note  PCP-Cardiologist: Tobias Alexander, MD   Subjective:    She remains off dobutamine, co-ox down from 88% yesterday to 65% today.  CVP 8 mmHg. Wt stable past 24 hrs at 184 lb. Going for [facility] later today.   Tbili increased back up from 2.9>>3.4.   S/p EGD (9/29): Bleeding gastric and duodenal ulcers treated in endoscopy suite with resolution of bleeding. No varices.  She got 1 unit PRBCs 10/2. Hgb stable past 24 hrs at 9.8. GI s/o 10/5.  2 runs of NSVT on tele (5 and 6 beats)  RN reports less confusion overnight.   Pt resting comfortably. Denies pain.    Objective:   Weight Range: 83.9 kg Body mass index is 30.78 kg/m.   Vital Signs:   Temp:  [98.2 F (36.8 C)-98.5 F (36.9 C)] 98.5 F (36.9 C) (10/06 0516) Pulse Rate:  [80-84] 82 (10/06 0516) Resp:  [17-20] 17 (10/06 0516) BP: (94-100)/(61-74) 100/74 (10/06 0516) SpO2:  [94 %-100 %] 94 % (10/06 0516) Weight:  [83.8 kg-83.9 kg] 83.9 kg (10/06 0500) Last BM Date: 09/01/19  Weight change: Filed Weights   09/01/19 0400 09/01/19 1948 09/02/19 0500  Weight: 83.9 kg 83.8 kg 83.9 kg    Intake/Output:   Intake/Output Summary (Last 24 hours) at 09/02/2019 0744 Last data filed at 09/02/2019 0526 Gross per 24 hour  Intake 1179.78 ml  Output 3600 ml  Net -2420.22 ml      Physical Exam    PHYSICAL EXAM: General:  Critically ill appearing middle aged WF. No respiratory difficulty HEENT: normal Neck: supple. no JVD. Carotids 2+ bilat; no bruits. No lymphadenopathy or thyromegaly appreciated. Cor: PMI nondisplaced. Regular rate & rhythm. 2/6 murmur at RUSB Lungs: clear Abdomen: soft, nontender, nondistended. No hepatosplenomegaly. No bruits or masses. Good bowel sounds. Extremities: both legs wrapped, bilateral LEE 2+ (CVP 8, likely from low albumin), dusty toes Rt LE Neuro: alert & oriented x 3,  cranial nerves grossly intact. moves all 4 extremities w/o difficulty. Affect pleasant.   Telemetry   NSR HR mid 60s. 2 runs of NSVT (5 and 6 beats)  Labs    CBC Recent Labs    09/01/19 0439  09/01/19 2227 09/02/19 0526  WBC 6.8  --   --  6.9  NEUTROABS 4.7  --   --  4.7  HGB 8.5*   < > 9.4* 9.8*  HCT 24.0*   < > 27.3* 29.0*  MCV 93.4  --   --  94.8  PLT 209  --   --  231   < > = values in this interval not displayed.   Basic Metabolic Panel Recent Labs    53/66/44 0439 09/01/19 1038 09/02/19 0526  NA 135  --  135  K 3.4*  --  3.8  CL 106  --  103  CO2 22  --  23  GLUCOSE 73  --  76  BUN 18  --  12  CREATININE 0.79  --  0.82  CALCIUM 7.1*  --  7.5*  MG  --  1.4* 1.9   Liver Function Tests Recent Labs    09/01/19 0439 09/02/19 0526  AST 37 41  ALT 29 33  ALKPHOS 103 111  BILITOT 2.9* 3.4*  PROT 4.6* 4.9*  ALBUMIN 1.2* 1.3*   No results for input(s): LIPASE, AMYLASE in the last  72 hours. Cardiac Enzymes No results for input(s): CKTOTAL, CKMB, CKMBINDEX, TROPONINI in the last 72 hours.  BNP: BNP (last 3 results) No results for input(s): BNP in the last 8760 hours.  ProBNP (last 3 results) No results for input(s): PROBNP in the last 8760 hours.   D-Dimer No results for input(s): DDIMER in the last 72 hours. Hemoglobin A1C No results for input(s): HGBA1C in the last 72 hours. Fasting Lipid Panel No results for input(s): CHOL, HDL, LDLCALC, TRIG, CHOLHDL, LDLDIRECT in the last 72 hours. Thyroid Function Tests No results for input(s): TSH, T4TOTAL, T3FREE, THYROIDAB in the last 72 hours.  Invalid input(s): FREET3  Other results:   Imaging    No results found.   Medications:     Scheduled Medications:  chlorhexidine  15 mL Mouth/Throat BID   Chlorhexidine Gluconate Cloth  6 each Topical Daily   digoxin  0.125 mg Oral Daily   feeding supplement  1 Container Oral TID BM   folic acid  1 mg Oral Daily   furosemide  40 mg Oral Daily    gabapentin  200 mg Oral BID   lactulose  30 g Per Tube Daily   losartan  25 mg Oral BID   mouth rinse  15 mL Mouth Rinse BID   multivitamin with minerals  1 tablet Per Tube Daily   nicotine  21 mg Transdermal Daily   pantoprazole  40 mg Intravenous Q12H   rifaximin  550 mg Oral BID   sodium chloride flush  10 mL Intravenous Q12H   sodium chloride flush  10 mL Intravenous Q12H   sodium chloride flush  3 mL Intravenous Q12H   spironolactone  25 mg Oral Daily   thiamine  100 mg Oral Daily    Infusions:  sodium chloride 10 mL/hr at 09/02/19 0318   sodium chloride     sodium chloride 10 mL/hr at 09/02/19 0522   heparin 1,100 Units/hr (09/02/19 0011)   magnesium sulfate bolus IVPB      PRN Medications: sodium chloride, sodium chloride, acetaminophen, albuterol, dextromethorphan-guaiFENesin, hydrALAZINE, ondansetron (ZOFRAN) IV, oxyCODONE, sodium chloride flush, sodium chloride flush, white petrolatum   Assessment/Plan   1. Acute on chronic systolic CHF/cardiogenic shock: Echo with EF 15-20%, mild LV dilation, LV thrombus, mild-moderate MR, severely decreased RV systolic function. Cause of cardiomyopathy is uncertain, but she was a heavy smoker and ECG is suggestive of possible old ASMI. Ischemic cardiomyopathy is possible, alternatively could be due to ETOH abuse (>6 pack/day per husband) or stress cardiomyopathy with critical illness.  Lactate was elevated at admission.  Suspect cardiogenic shock with low BP, cannot rule out component of septic shock. Off dobutamine now. Drop in co-ox past 24 hrs from 88%-65%.  CVP 8 today. Weight stable past 24 hrs at 184 lb. Still w/ bilateral LEE but suspect 2/2 low albumin at 1.3.  - Plan for [facility] today, assess for coronary disease as cause of cardiomyopathy.  - Continue Lasix 40 mg po daily.  Continue w/ Unna boots. RHC today will help guide further diuresis. - BP soft today. Will keep HF regimen the same.  - Continue  spironolactone 25 mg daily.  - Continue digoxin 0.125.  - Continue losartan 25 mg bid, creatinine stable.  May be able to transition to [facility] soon, if BP allows.  - Not candidate for advanced therapies with ETOH abuse/liver failure.    2. 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 Markedly elevated bilirubin initially with jaundice.  However liver  imaging with US and CT has NOT shown cirrhosis or portal/hepatic vein thrombosis. Question has arisen whether this might possibly be due to primarily to RV failure.  Time course seems quite acute for RV failure.  Jaundice/icterus improving but Tbili down increased in the last 24 hrs, up from 2.9>>3.4.  °- holding stain and other hepato-toxic agents  ° °3. Encephalopathy: ?Cause.  NH3 has not been markedly high but certainly could be hepatic encephalopathy.  Shock likely played a role.  °- Mostly resolved, still w/ some confusion °- Continue lactulose/rifaxamin per GI   ° °4. AKI: Creatinine improved with inotropic support.  Suspect cardiorenal syndrome, also had contrast with LE angiograms so possible component of contrast nephropathy. Creatinine normalized. 0.83 today.  ° °5. LV thrombus: Possible cause of embolic event to popliteal arteries as well as subacute CVA. Continue IV heparin. Pharmacy following. ° °6. PAD: Bilateral popliteal artery occlusions, suspect cardioembolic from LV thrombus.  Now s/p mechanical thrombectomy by vascular. Right foot still looks ischemic but dopplerable pulses.  ° °7. 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  continue IV heparin.  ° °8. Right popliteal vein DVT: heparin currently  ° °9. GI bleeding: Upper GI bleeding, EGD with bleeding duodenal and gastric ulcer on 9/29 that were treated, no varices. She had 1 unit PRBCs on 9/30 and 1 unit on 10/2. Hgb stable over past 24 hrs at 9.8. GI s/o. Continue Protonix.  °   °10. ETOH abuse: Per husband, at least a 6 pack daily. Plans to quit.  ° °11.  ID: Concern for aspiration PNA. °- Completed course of ceftriaxone.  ° °12. Vaginal bleeding: She had a significant amount 10/4.  RN reports no bleeding noted today. Per [facility], suspect she needs gynecology eval.  ° °13. Deconditioning:  work with PT.   ° °14. NSVT: 5 and 6 beat runs on tele. K 3.8 today. Mg 1.9. continue to monitor.  ° °Length of Stay: 17 ° °Brittainy Simmons, PA-C  °09/02/2019, 7:44 AM ° °Patient seen with PA, agree with the above note.  Hgb stable at 9.8, melena in bag but think this is old.  Active bleeding seems to have subsided.  She is back on heparin gtt.  No complaints this morning, CVP 8.  BP tolerating current HF regimen.  ° °General: NAD °Neck: No JVD, no thyromegaly or thyroid nodule.  °Lungs: Clear to auscultation bilaterally with normal respiratory effort. °CV: Nondisplaced PMI.  Heart regular S1/S2, no S3/S4, 2/6 early SEM RUSB.  1+ edema to knees.  °Abdomen: Soft, nontender, no hepatosplenomegaly, no distention.  °Skin: Intact without lesions or rashes.  °Neurologic: Alert and oriented x 3.  °Psych: Normal affect. °Extremities: Ischemic changes right foot.  °HEENT: mildly icteric  ° °Stable today, euvolemic.  Continue current HF regimen.  ° °I think that her active GI bleeding has resolved.  GI has signed off. Remains on PPI.   ° °Plan for right/left heart cath today to assess filling pressures and cardiac output and also to assess for coronary disease as cause of cardiomyopathy.  This will be purely diagnostic, want to see her out further from GI bleeding prior to any intervention requiring antiplatelet therapy in addition to the anticoagulation she is already on, also not currently a CABG candidate given totality of her illness.  Discussed risks/benefits with patient and her husband, they agree to procedure.  ° °She is on heparin gtt currently, will transition to warfarin after cath.  ° °Khiry Pasquariello   Andruw Battie 09/02/2019 8:35 AM

## 2019-09-02 NOTE — Progress Notes (Signed)
PT Cancellation Note  Patient Details Name: Briana Andrews MRN: [id number] DOB: 1966-05-18   Cancelled Treatment:    Reason Eval/Treat Not Completed: Patient at procedure or test/unavailable pt off floor at cardiac cath. Will follow up as time allows.   [name] 09/02/2019, 9:33 AM Wray Kearns, PT, DPT Acute Rehabilitation Services Pager [phone] Office [phone]

## 2019-09-02 NOTE — Progress Notes (Signed)
OT Cancellation Note  Patient Details Name: Briana Andrews MRN: [id number] DOB: 1966-07-29   Cancelled Treatment:    Reason Eval/Treat Not Completed: Patient at procedure or test/ unavailable, off unit at heart cath.  Will follow and see as able.   Delight Stare, OT Acute Rehabilitation Services Pager [phone] Office [phone]   Delight Stare 09/02/2019, 9:29 AM

## 2019-09-02 NOTE — Progress Notes (Addendum)
Patient ID: Briana Andrews, female   DOB: [birth year], 53 y.o.   MRN: [id number]     Advanced Heart Failure Rounding Note  PCP-Cardiologist: Tobias Alexander, MD   Subjective:    She remains off dobutamine, co-ox down from 88% yesterday to 65% today.  CVP 8 mmHg. Wt stable past 24 hrs at 184 lb. Going for [facility] later today.   Tbili increased back up from 2.9>>3.4.   S/p EGD (9/29): Bleeding gastric and duodenal ulcers treated in endoscopy suite with resolution of bleeding. No varices.  She got 1 unit PRBCs 10/2. Hgb stable past 24 hrs at 9.8. GI s/o 10/5.  2 runs of NSVT on tele (5 and 6 beats)  RN reports less confusion overnight.   Pt resting comfortably. Denies pain.    Objective:   Weight Range: 83.9 kg Body mass index is 30.78 kg/m.   Vital Signs:   Temp:  [98.2 F (36.8 C)-98.5 F (36.9 C)] 98.5 F (36.9 C) (10/06 0516) Pulse Rate:  [80-84] 82 (10/06 0516) Resp:  [17-20] 17 (10/06 0516) BP: (94-100)/(61-74) 100/74 (10/06 0516) SpO2:  [94 %-100 %] 94 % (10/06 0516) Weight:  [83.8 kg-83.9 kg] 83.9 kg (10/06 0500) Last BM Date: 09/01/19  Weight change: Filed Weights   09/01/19 0400 09/01/19 1948 09/02/19 0500  Weight: 83.9 kg 83.8 kg 83.9 kg    Intake/Output:   Intake/Output Summary (Last 24 hours) at 09/02/2019 0744 Last data filed at 09/02/2019 0526 Gross per 24 hour  Intake 1179.78 ml  Output 3600 ml  Net -2420.22 ml      Physical Exam    PHYSICAL EXAM: General:  Critically ill appearing middle aged WF. No respiratory difficulty HEENT: normal Neck: supple. no JVD. Carotids 2+ bilat; no bruits. No lymphadenopathy or thyromegaly appreciated. Cor: PMI nondisplaced. Regular rate & rhythm. 2/6 murmur at RUSB Lungs: clear Abdomen: soft, nontender, nondistended. No hepatosplenomegaly. No bruits or masses. Good bowel sounds. Extremities: both legs wrapped, bilateral LEE 2+ (CVP 8, likely from low albumin), dusty toes Rt LE Neuro: alert & oriented x 3,  cranial nerves grossly intact. moves all 4 extremities w/o difficulty. Affect pleasant.   Telemetry   NSR HR mid 60s. 2 runs of NSVT (5 and 6 beats)  Labs    CBC Recent Labs    09/01/19 0439  09/01/19 2227 09/02/19 0526  WBC 6.8  --   --  6.9  NEUTROABS 4.7  --   --  4.7  HGB 8.5*   < > 9.4* 9.8*  HCT 24.0*   < > 27.3* 29.0*  MCV 93.4  --   --  94.8  PLT 209  --   --  231   < > = values in this interval not displayed.   Basic Metabolic Panel Recent Labs    53/66/44 0439 09/01/19 1038 09/02/19 0526  NA 135  --  135  K 3.4*  --  3.8  CL 106  --  103  CO2 22  --  23  GLUCOSE 73  --  76  BUN 18  --  12  CREATININE 0.79  --  0.82  CALCIUM 7.1*  --  7.5*  MG  --  1.4* 1.9   Liver Function Tests Recent Labs    09/01/19 0439 09/02/19 0526  AST 37 41  ALT 29 33  ALKPHOS 103 111  BILITOT 2.9* 3.4*  PROT 4.6* 4.9*  ALBUMIN 1.2* 1.3*   No results for input(s): LIPASE, AMYLASE in the last  72 hours. Cardiac Enzymes No results for input(s): CKTOTAL, CKMB, CKMBINDEX, TROPONINI in the last 72 hours.  BNP: BNP (last 3 results) No results for input(s): BNP in the last 8760 hours.  ProBNP (last 3 results) No results for input(s): PROBNP in the last 8760 hours.   D-Dimer No results for input(s): DDIMER in the last 72 hours. Hemoglobin A1C No results for input(s): HGBA1C in the last 72 hours. Fasting Lipid Panel No results for input(s): CHOL, HDL, LDLCALC, TRIG, CHOLHDL, LDLDIRECT in the last 72 hours. Thyroid Function Tests No results for input(s): TSH, T4TOTAL, T3FREE, THYROIDAB in the last 72 hours.  Invalid input(s): FREET3  Other results:   Imaging    No results found.   Medications:     Scheduled Medications:  chlorhexidine  15 mL Mouth/Throat BID   Chlorhexidine Gluconate Cloth  6 each Topical Daily   digoxin  0.125 mg Oral Daily   feeding supplement  1 Container Oral TID BM   folic acid  1 mg Oral Daily   furosemide  40 mg Oral Daily    gabapentin  200 mg Oral BID   lactulose  30 g Per Tube Daily   losartan  25 mg Oral BID   mouth rinse  15 mL Mouth Rinse BID   multivitamin with minerals  1 tablet Per Tube Daily   nicotine  21 mg Transdermal Daily   pantoprazole  40 mg Intravenous Q12H   rifaximin  550 mg Oral BID   sodium chloride flush  10 mL Intravenous Q12H   sodium chloride flush  10 mL Intravenous Q12H   sodium chloride flush  3 mL Intravenous Q12H   spironolactone  25 mg Oral Daily   thiamine  100 mg Oral Daily    Infusions:  sodium chloride 10 mL/hr at 09/02/19 0318   sodium chloride     sodium chloride 10 mL/hr at 09/02/19 0522   heparin 1,100 Units/hr (09/02/19 0011)   magnesium sulfate bolus IVPB      PRN Medications: sodium chloride, sodium chloride, acetaminophen, albuterol, dextromethorphan-guaiFENesin, hydrALAZINE, ondansetron (ZOFRAN) IV, oxyCODONE, sodium chloride flush, sodium chloride flush, white petrolatum   Assessment/Plan   1. Acute on chronic systolic CHF/cardiogenic shock: Echo with EF 15-20%, mild LV dilation, LV thrombus, mild-moderate MR, severely decreased RV systolic function. Cause of cardiomyopathy is uncertain, but she was a heavy smoker and ECG is suggestive of possible old ASMI. Ischemic cardiomyopathy is possible, alternatively could be due to ETOH abuse (>6 pack/day per husband) or stress cardiomyopathy with critical illness.  Lactate was elevated at admission.  Suspect cardiogenic shock with low BP, cannot rule out component of septic shock. Off dobutamine now. Drop in co-ox past 24 hrs from 88%-65%.  CVP 8 today. Weight stable past 24 hrs at 184 lb. Still w/ bilateral LEE but suspect 2/2 low albumin at 1.3.  - Plan for [facility] today, assess for coronary disease as cause of cardiomyopathy.  - Continue Lasix 40 mg po daily.  Continue w/ Unna boots. RHC today will help guide further diuresis. - BP soft today. Will keep HF regimen the same.  - Continue  spironolactone 25 mg daily.  - Continue digoxin 0.125.  - Continue losartan 25 mg bid, creatinine stable.  May be able to transition to [facility] soon, if BP allows.  - Not candidate for advanced therapies with ETOH abuse/liver failure.    2. 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 Markedly elevated bilirubin initially with jaundice.  However liver  imaging with US and CT has NOT shown cirrhosis or portal/hepatic vein thrombosis. Question has arisen whether this might possibly be due to primarily to RV failure.  Time course seems quite acute for RV failure.  Jaundice/icterus improving but Tbili down increased in the last 24 hrs, up from 2.9>>3.4.  - holding stain and other hepato-toxic agents   3. Encephalopathy: ?Cause.  NH3 has not been markedly high but certainly could be hepatic encephalopathy.  Shock likely played a role.  - Mostly resolved, still w/ some confusion - Continue lactulose/rifaxamin per GI    4. AKI: Creatinine improved with inotropic support.  Suspect cardiorenal syndrome, also had contrast with LE angiograms so possible component of contrast nephropathy. Creatinine normalized. 0.83 today.   5. LV thrombus: Possible cause of embolic event to popliteal arteries as well as subacute CVA. Continue IV heparin. Pharmacy following.  6. PAD: Bilateral popliteal artery occlusions, suspect cardioembolic from LV thrombus.  Now s/p mechanical thrombectomy by vascular. Right foot still looks ischemic but dopplerable pulses.   7. 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  continue IV heparin.   8. Right popliteal vein DVT: heparin currently   9. GI bleeding: Upper GI bleeding, EGD with bleeding duodenal and gastric ulcer on 9/29 that were treated, no varices. She had 1 unit PRBCs on 9/30 and 1 unit on 10/2. Hgb stable over past 24 hrs at 9.8. GI s/o. Continue Protonix.     10. ETOH abuse: Per husband, at least a 6 pack daily. Plans to quit.   11.  ID: Concern for aspiration PNA. - Completed course of ceftriaxone.   12. Vaginal bleeding: She had a significant amount 10/4.  RN reports no bleeding noted today. Per [facility], suspect she needs gynecology eval.   13. Deconditioning:  work with PT.    14. NSVT: 5 and 6 beat runs on tele. K 3.8 today. Mg 1.9. continue to monitor.   Length of Stay: [address]17  Brittainy Simmons, PA-C  09/02/2019, 7:44 AM  Patient seen with PA, agree with the above note.  Hgb stable at 9.8, melena in bag but think this is old.  Active bleeding seems to have subsided.  She is back on heparin gtt.  No complaints this morning, CVP 8.  BP tolerating current HF regimen.   General: NAD Neck: No JVD, no thyromegaly or thyroid nodule.  Lungs: Clear to auscultation bilaterally with normal respiratory effort. CV: Nondisplaced PMI.  Heart regular S1/S2, no S3/S4, 2/6 early SEM RUSB.  1+ edema to knees.  Abdomen: Soft, nontender, no hepatosplenomegaly, no distention.  Skin: Intact without lesions or rashes.  Neurologic: Alert and oriented x 3.  Psych: Normal affect. Extremities: Ischemic changes right foot.  HEENT: mildly icteric   Stable today, euvolemic.  Continue current HF regimen.   I think that her active GI bleeding has resolved.  GI has signed off. Remains on PPI.    Plan for right/left heart cath today to assess filling pressures and cardiac output and also to assess for coronary disease as cause of cardiomyopathy.  This will be purely diagnostic, want to see her out further from GI bleeding prior to any intervention requiring antiplatelet therapy in addition to the anticoagulation she is already on, also not currently a CABG candidate given totality of her illness.  Discussed risks/benefits with patient and her husband, they agree to procedure.   She is on heparin gtt currently, will transition to warfarin after cath.   Sherial Ebrahim  Kenderick Kobler 09/02/2019 8:35 AM

## 2019-09-02 NOTE — Progress Notes (Signed)
PROGRESS NOTE  Briana Andrews QVZ:[id number] DOB: November 17, 1966 DOA: 08/15/2019 PCP: Patient, [name]  Brief History   53 yo alcoholic woman admitted with R frontal CVA, B popliteal artery occlusions, hepatic failure, GI bleeding. She was found to have new cardiomyopathy with an LV thrombus, cirrhosis, ARF. Underwent B LE thrombectomies, was treated with heparin, stabilized. Has required hemodynamic support w dobutamine. Received PRBC and was able to undergo EGD 9/29 that showed duodenal and gastric ulcers which were treated endoscopically. Remains on dobuta, PPI gtt, heparin.   On 08/29/2019 the patient developed bleeding from multiple sites including her mouth, urine, and rectum. Pharmacy had reduced the dose and goal heparin level earlier in the day. I discussed with pharmacy. The heparin will be held for one hour and then restarted at yet an even lower dose. When it was restarted the patient again had bleeding with continued decline in Hgb. It was again placed on hold on 08/31/2019. It has been restarted again this morning. Monitor.  Transthoracic Echocardiogram repeated on 08/31/2019. Persistent appearance of large left ventricular mural thrombus.  The patient underwent left and right heart catheterization on 09/02/2019. Conclusions from this study were:  1. Optimized filling pressures.  2. Mild pulmonary hypertension.  3. Preserved cardiac output.  4. Extensive CAD, primarily distal/branch vessel disease.  The only interventional target would be 80% proximal RCA stenosis. Could consider RCA PCI if she has exertional symptoms despite medical therapy.  Consultants  . Palliative Care . PCCM . Heart Failure Team . Vascular Surgery . Cardiology  Procedures  . EGD/Epinephrine injection . Mechanical Intubation . Central Venous Catheter . Extubation . Arteriogram of right and left lower extremities . Mechanical thrombectomy of the left popliteal and posterior tibial artery using Penumbra CAT  6 device Mechanical thrombectomy of the right popliteal and posterior tibial artery using Penumbra CAT 6 device  Antibiotics   Anti-infectives (From admission, onward)   Start     Dose/Rate Route Frequency Ordered Stop   08/28/19 2200  rifaximin (XIFAXAN) tablet 550 mg     550 mg Oral 2 times daily 08/28/19 0934     08/26/19 1000  rifaximin (XIFAXAN) tablet 550 mg  Status:  Discontinued     550 mg Per Tube 2 times daily 08/25/19 2113 08/28/19 0935   08/20/19 1800  cefTRIAXone (ROCEPHIN) 2 g in sodium chloride 0.9 % 100 mL IVPB  Status:  Discontinued     2 g 200 mL/hr over 30 Minutes Intravenous Every 24 hours 08/20/19 1600 08/23/19 0825   08/18/19 0700  levofloxacin (LEVAQUIN) tablet 750 mg  Status:  Discontinued     750 mg Oral Every 48 hours 08/17/19 1205 08/20/19 1600   08/16/19 1115  rifaximin (XIFAXAN) tablet 550 mg  Status:  Discontinued     550 mg Oral 2 times daily 08/16/19 1114 08/25/19 2113   08/16/19 0700  levofloxacin (LEVAQUIN) IVPB 750 mg  Status:  Discontinued     750 mg 100 mL/hr over 90 Minutes Intravenous Every 48 hours 08/16/19 0616 08/17/19 1205     Subjective  The patient is resting comfortably. No new complaints.  Objective   Vitals:  Vitals:   09/02/19 1056 09/02/19 1200  BP: 97/62 112/70  Pulse: 77 75  Resp: (!) 21 (!) 26  Temp: 97.7 F (36.5 C)   SpO2: 92% 100%   Exam:  Constitutional:  . The patient is awake, alert, and oriented x 3. No acute distress. Respiratory:  . No increased work of breathing. Marland Kitchen  No wheezes, rales, or rhonchi . No tactile fremitus Cardiovascular:  . Regular rate and rhythm . No murmurs, ectopy, or gallups. . No lateral PMI. No thrills. Abdomen:  . Abdomen is soft, non-tender, non-distended . No hernias, masses, or organomegaly . Normoactive bowel sounds.  Musculoskeletal:  . No cyanosis, clubbing, or edema Skin:  . No rashes, lesions, ulcers . palpation of skin: no induration or nodules Neurologic:  . CN 2-12  intact . Sensation all 4 extremities intact Psychiatric:  . Mental status o Mood: Depressed, affect flat o Orientation to person, place, time  . judgment and insight appear intact  I have personally reviewed the following:   Today's Data  . Vitals, CBC, CMP  Scheduled Meds: . chlorhexidine  15 mL Mouth/Throat BID  . Chlorhexidine Gluconate Cloth  6 each Topical Daily  . digoxin  0.125 mg Oral Daily  . feeding supplement  1 Container Oral TID BM  . folic acid  1 mg Oral Daily  . furosemide  40 mg Oral Daily  . gabapentin  200 mg Oral BID  . lactulose  30 g Per Tube Daily  . losartan  25 mg Oral BID  . mouth rinse  15 mL Mouth Rinse BID  . multivitamin with minerals  1 tablet Per Tube Daily  . nicotine  21 mg Transdermal Daily  . pantoprazole  40 mg Intravenous Q12H  . rifaximin  550 mg Oral BID  . sodium chloride flush  10 mL Intravenous Q12H  . sodium chloride flush  10 mL Intravenous Q12H  . sodium chloride flush  3 mL Intravenous Q12H  . sodium chloride flush  3 mL Intravenous Q12H  . spironolactone  25 mg Oral Daily  . thiamine  100 mg Oral Daily  . warfarin  2 mg Oral ONCE-1800  . Warfarin - Pharmacist Dosing Inpatient   Does not apply q1800   Continuous Infusions: . sodium chloride 10 mL/hr at 09/02/19 0318  . sodium chloride    . heparin    . 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 Dobutamine weaned to off.  The patient was successfully extubated after EGD yesterday and respiratory status is stable on 4L . The patient underwent LHC and RHC today. Conclusions:  1. Optimized filling pressures.  2. Mild pulmonary hypertension.  3. Preserved cardiac output.  4. Extensive CAD, primarily distal/branch vessel disease.  The only interventional target would be 80% proximal RCA stenosis. Could consider RCA PCI if she has exertional symptoms despite medical therapy.  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 As per cardiology.  Hemorrhage from mouth, urine, and rectum per nursing: Pharmacy had reduced the dose and goal heparin level earlier in the day. I discussed with pharmacy. The heparin will be held for one hour and then restarted at yet an even lower dose. Again this morning nursing is reporting a large amount of blood underneath the patient assumed to be from her rectum or possibly her vagina. Hemoglobin has dropped from 10.4 to 8.7 this morning. Cardiology aware, and states that pt continues to require heparin, although it may be held intermittently. GI also aware, but does not feel that any intervention is necessary. Heparin as per cardiology.  LV clot on AC:  ASA 81 mg daily. Continue heparin drip. Monitor CBC.  GI Bleed: The patient presented with melena with suspicion for upper GI source, intermittent transfusion requirements. EGD demonstrated oozing 24mm pre-pyloric ulcer. This was injected with Epi. Hgb 7.5 overnight and received 1 unit PRBC's. Hemoglobin dropped to 7 again on 08/29/2019 after observation of bleeding in mouth, in urine, and in  stool. Heparin is held for one hour and dose reduced for the second time. The patient was transfused with 2 units PRBC's. Bleeding stopped overnight. Hemoglobin now stable. Monitor.  Alcoholism, hyperbilirubinemia, clinically she has cirrhosis although imaging features not c/w this; LFT pattern not c/w alcoholic hepatitis nor ischemic nor congestive. We will continue lactulose, rifaximin, thiamine and folate.  Encephalopathy: Combination from critical illness, hepatic, and CVA, still has asterixis, improving overall. Continue lactulose.  AKI: Improving related to initial shock, blood loss.  CVA: likely secondary to emboli from LV clot.  BL femoral artery occlusion s/p BL thrombectomy:  RLE does not look great but dopplerable pulses, vascular surgery aware, poor candidate for further procedures, monitor clinically.  Severe protein calorie malnutrition due to alcoholism: Nutrition Consult.  Rt sided pleural effusion without resp distress: likely due to hepatic hydrothorax vs. distrubutive from poor oncotic pressure. No indication to drain.  I have seen and examined this patient myself. I have spent 30 minutes in her evaluation and care.  Kenidi Elenbaas, DO [facility] Direct contact: see [email or web address]  7PM-7AM contact night coverage as above 09/02/2019, 3:33 PM  LOS: 12 days

## 2019-09-02 NOTE — Progress Notes (Signed)
Physical Therapy Treatment Patient Details Name: Briana Andrews MRN: [id number] DOB: 10-31-1966 Today's Date: 09/02/2019    History of Present Illness 53 year old female with history of smoking, alcohol use, she drinks more than 6 packs of beer every day since last 20 years, no chronic medical issues brought to the emergency room with confusion more than usual and falls.  Since last few months there have been noticing some changes on her including weakness on her legs and poor appetite as much as she stopped smoking and decreased her drinking.  2 weeks ago, she was taken to [facility] with a fall, skeletal survey was negative except L1 fracture with 25% height loss and discharged home on muscle relaxants.  Patient has been intermittently confused since then. In the emergency room, she was found with subacute right ACA stroke, jaundice with bilirubin of 12, bilateral multifocal pneumonia and metabolic encephalopathy.s/p cardiac cath 10/6. Echo 10/4- LV thrombus.    PT Comments    Patient asleep upon arrival with blood leaking from radial cath site so RN notified. Able to be awoken to work with therapies. Requires Mod A of 2 for bed mobility as pt not able to use RUE for pushing/pulling. Requires assist of 2 to partially stand from EOB to remove bloody chuck pad. Slow processing time to answer questions/follow commands. Limited today by lethargy and pain with all movement. Interested in trying to eat. Will continue to follow and progress as tolerated when pt more aroused/able to participate.     Follow Up Recommendations  CIR;Supervision/Assistance - 24 hour     Equipment Recommendations  Other (comment)(defer)    Recommendations for Other Services       Precautions / Restrictions Precautions Precautions: Fall Precaution Comments: flexiseal, foley, wound on Rt foot, bleeding Restrictions Weight Bearing Restrictions: No    Mobility  Bed Mobility Overal bed mobility: Needs  Assistance Bed Mobility: Rolling;Sidelying to Sit Rolling: Min assist Sidelying to sit: Mod assist;+2 for physical assistance;HOB elevated   Sit to supine: Max assist;+2 for physical assistance   General bed mobility comments: Step by step cues for technique and to refrain from using RUE due to radial access from cath; able to initiate moving LEs but needed assist with bottom and to elevate trunk.  Transfers Overall transfer level: Needs assistance Equipment used: 2 person hand held assist Transfers: Sit to/from Stand Sit to Stand: Mod assist;+2 physical assistance         General transfer comment: Assist of 2 to power to standing, able to get half way up, removed bloody chuck pad. good initiation. Wound on right foot to favored LLE.  Ambulation/Gait             General Gait Details: Deferred   Stairs             Wheelchair Mobility    Modified Rankin (Stroke Patients Only) Modified Rankin (Stroke Patients Only) Pre-Morbid Rankin Score: No symptoms Modified Rankin: Moderately severe disability     Balance Overall balance assessment: Needs assistance Sitting-balance support: Feet supported;No upper extremity supported Sitting balance-Leahy Scale: Fair     Standing balance support: During functional activity Standing balance-Leahy Scale: Poor Standing balance comment: Stood half way up with assist of 2.                            Cognition Arousal/Alertness: Lethargic Behavior During Therapy: Flat affect Overall Cognitive Status: No family/caregiver present to determine baseline cognitive functioning  Area of Impairment: Following commands;Problem solving;Awareness                       Following Commands: Follows one step commands with increased time   Awareness: Intellectual Problem Solving: Slow processing;Decreased initiation;Difficulty sequencing;Requires verbal cues;Requires tactile cues General Comments: Pt with slow  processing and delayed response time. Needs step by step cues for technique. Oriented to month and year but was not able to state what procedure she had today.      Exercises      General Comments General comments (skin integrity, edema, etc.): Right foot bandaged and wrapped.  BP supine 105/70, BP sitting EOB 113/79.      Pertinent Vitals/Pain Pain Assessment: Faces Faces Pain Scale: Hurts whole lot Pain Location: bottom, everywhere Pain Descriptors / Indicators: Grimacing;Moaning Pain Intervention(s): Repositioned;Monitored during session;Limited activity within patient's tolerance    Home Living                      Prior Function            PT Goals (current goals can now be found in the care plan section) Progress towards PT goals: Not progressing toward goals - comment(due to level of arousal and pain s/p cath)    Frequency    Min 3X/week      PT Plan Current plan remains appropriate    Co-evaluation PT/OT/SLP Co-Evaluation/Treatment: Yes Reason for Co-Treatment: For patient/therapist safety;To address functional/ADL transfers;Necessary to address cognition/behavior during functional activity PT goals addressed during session: Mobility/safety with mobility        AM-PAC PT "6 Clicks" Mobility   Outcome Measure  Help needed turning from your back to your side while in a flat bed without using bedrails?: A Little Help needed moving from lying on your back to sitting on the side of a flat bed without using bedrails?: A Lot Help needed moving to and from a bed to a chair (including a wheelchair)?: A Lot Help needed standing up from a chair using your arms (e.g., wheelchair or bedside chair)?: A Lot Help needed to walk in hospital room?: Total Help needed climbing 3-5 steps with a railing? : Total 6 Click Score: 11    End of Session Equipment Utilized During Treatment: Oxygen Activity Tolerance: Patient limited by lethargy;Patient limited by  pain Patient left: in bed;with call bell/phone within reach;with bed alarm set Nurse Communication: Mobility status PT Visit Diagnosis: Other abnormalities of gait and mobility (R26.89);Pain;Muscle weakness (generalized) (M62.81);Difficulty in walking, not elsewhere classified (R26.2) Pain - Right/Left: Right Pain - part of body: Arm     Time: 1237-1300 PT Time Calculation (min) (ACUTE ONLY): 23 min  Charges:  $Therapeutic Activity: 8-22 mins                     Mylo Red, PT, DPT Acute Rehabilitation Services Pager [phone] Office [phone]       Blake Divine A Tanyiah Laurich 09/02/2019, 1:28 PM

## 2019-09-03 ENCOUNTER — Inpatient Hospital Stay (HOSPITAL_COMMUNITY): Payer: 59

## 2019-09-03 LAB — PROTIME-INR
INR: 2.3 — ABNORMAL HIGH (ref 0.8–1.2)
Prothrombin Time: 24.8 seconds — ABNORMAL HIGH (ref 11.4–15.2)

## 2019-09-03 LAB — HEMOGLOBIN AND HEMATOCRIT, BLOOD
HCT: 29.4 % — ABNORMAL LOW (ref 36.0–46.0)
HCT: 27.5 % — ABNORMAL LOW (ref 36.0–46.0)
Hemoglobin: 10 g/dL — ABNORMAL LOW (ref 12.0–15.0)
Hemoglobin: 9.5 g/dL — ABNORMAL LOW (ref 12.0–15.0)

## 2019-09-03 LAB — COMPREHENSIVE METABOLIC PANEL
ALT: 35 U/L (ref 0–44)
AST: 42 U/L — ABNORMAL HIGH (ref 15–41)
Albumin: 1.3 g/dL — ABNORMAL LOW (ref 3.5–5.0)
Alkaline Phosphatase: 113 U/L (ref 38–126)
Anion gap: 10 (ref 5–15)
BUN: 9 mg/dL (ref 6–20)
CO2: 22 mmol/L (ref 22–32)
Calcium: 7.7 mg/dL — ABNORMAL LOW (ref 8.9–10.3)
Chloride: 105 mmol/L (ref 98–111)
Creatinine, Ser: 0.91 mg/dL (ref 0.44–1.00)
GFR calc Af Amer: >60 mL/min (ref >60)
GFR calc non Af Amer: >60 mL/min (ref >60)
Glucose, Bld: 69 mg/dL — ABNORMAL LOW (ref 70–99)
Potassium: 4 mmol/L (ref 3.5–5.1)
Sodium: 137 mmol/L (ref 135–145)
Total Bilirubin: 3.3 mg/dL — ABNORMAL HIGH (ref 0.3–1.2)
Total Protein: 5.3 g/dL — ABNORMAL LOW (ref 6.5–8.1)

## 2019-09-03 LAB — COOXEMETRY PANEL
Carboxyhemoglobin: 1.7 % — ABNORMAL HIGH (ref 0.5–1.5)
Methemoglobin: 0.9 % (ref 0.0–1.5)
O2 Saturation: 66.1 %
Total hemoglobin: 9.1 g/dL — ABNORMAL LOW (ref 12.0–16.0)

## 2019-09-03 LAB — CBC
HCT: 30.8 % — ABNORMAL LOW (ref 36.0–46.0)
Hemoglobin: 10.1 g/dL — ABNORMAL LOW (ref 12.0–15.0)
MCH: 32 pg (ref 26.0–34.0)
MCHC: 32.8 g/dL (ref 30.0–36.0)
MCV: 97.5 fL (ref 80.0–100.0)
Platelets: 259 10*3/uL (ref 150–400)
RBC: 3.16 MIL/uL — ABNORMAL LOW (ref 3.87–5.11)
RDW: 18.8 % — ABNORMAL HIGH (ref 11.5–15.5)
WBC: 7.5 10*3/uL (ref 4.0–10.5)
nRBC: 0 % (ref 0.0–0.2)

## 2019-09-03 LAB — HEPARIN LEVEL (UNFRACTIONATED): Heparin Unfractionated: 0.24 IU/mL — ABNORMAL LOW (ref 0.30–0.70)

## 2019-09-03 MED ORDER — WARFARIN SODIUM 1 MG PO TABS
1.0000 mg | ORAL_TABLET | Freq: Once | ORAL | Status: AC
  Administered 2019-09-03: 1 mg via ORAL
  Filled 2019-09-03: qty 1

## 2019-09-03 MED ORDER — CARVEDILOL 3.125 MG PO TABS
3.1250 mg | ORAL_TABLET | Freq: Two times a day (BID) | ORAL | Status: DC
  Administered 2019-09-04 – 2019-09-13 (×15): 3.125 mg via ORAL
  Filled 2019-09-03 (×19): qty 1

## 2019-09-03 MED ORDER — PANTOPRAZOLE SODIUM 40 MG PO TBEC
40.0000 mg | DELAYED_RELEASE_TABLET | Freq: Two times a day (BID) | ORAL | Status: DC
  Administered 2019-09-03 – 2019-09-13 (×21): 40 mg via ORAL
  Filled 2019-09-03 (×21): qty 1

## 2019-09-03 NOTE — Progress Notes (Signed)
Orthopedic Tech Progress Note Patient Details:  Briana Andrews 1966/11/07 [id number] Removed the one on the left, washed leg and then reapplied new unna boot. Right leg has a dressing that has to be changed often. Ortho Devices Type of Ortho Device: Haematologist Ortho Device/Splint Location: LLE Ortho Device/Splint Interventions: Adjustment, Application, Ordered, Removal   Post Interventions Patient Tolerated: Well Instructions Provided: Care of device, Adjustment of device   [name] 09/03/2019, 8:37 AM

## 2019-09-03 NOTE — Progress Notes (Signed)
  Speech Language Pathology Treatment: Cognitive-Linquistic  Patient Details Name: Briana Andrews MRN: [id number] DOB: 10-26-1966 Today's Date: 09/03/2019 Time: 3614-4315 SLP Time Calculation (min) (ACUTE ONLY): 13 min  Assessment / Plan / Recommendation Clinical Impression  Pt participated in cognitive-linguistic tx - demonstrated orientation to person, place, more difficulty with elements of time.  She demonstrated improved functional attention to verbal tasks, with few repetitions required.  Pt used mnemonic device to recall five words; demonstrated recall of 4/5 after two minute and five minute delay with interference. Demonstrated improving intellectual awareness of condition and recall of diagnoses.  Continue SLP for cognition s/p CVA.   HPI HPI: 53 year old female with history of smoking, alcohol use, she drinks more than 6 packs of beer every day since last 20 years, no chronic medical issues brought to the emergency room with confusion more than usual and falls.  Since last few months there have been noticing some changes on her including weakness on her legs and poor appetite as much as she stopped smoking and decreased her drinking.  2 weeks ago, she was taken to [facility] with a fall, skeletal survey was negative except L1 fracture with 25% height loss and discharged home on muscle relaxants.  Patient has been intermittently confused since then. In the emergency room, she was found with subacute right ACA stroke, jaundice with bilirubin of 12, bilateral multifocal pneumonia and metabolic encephalopathy. Intubated 9/28-9/29. GIB resolving      SLP Plan  Continue with current plan of care       Recommendations                   Oral Care Recommendations: Oral care BID SLP Visit Diagnosis: Attention and concentration deficit Attention and concentration deficit following: Cerebral infarction Plan: Continue with current plan of care       GO            Arda Daggs L. Tivis Ringer, [place] Office number [phone] Pager [phone]    Briana Andrews 09/03/2019, 10:52 AM

## 2019-09-03 NOTE — Progress Notes (Signed)
Patient ID: Briana Andrews, female   DOB: 1966/05/23, 53 y.o.   MRN: [id number]     Advanced Heart Failure Rounding Note  PCP-Cardiologist: Tobias Alexander, MD   Subjective:    She is awake and alert this morning, no complaints.    S/p EGD (9/29): Bleeding gastric and duodenal ulcers treated in endoscopy suite with resolution of bleeding. No varices.  She got 1 unit PRBCs 10/2. Hemoglobin now stable, no overt bleeding.   LHC/RHC (10/6):  Coronary Findings  Diagnostic Dominance: Right Left Main  Short, no significant disease.  Left Anterior Descending  40% mid LAD stenosis after D2, diffuse moderate luminal irregularities distal LAD.  Ramus Intermedius  Small caliber vessel, appears to be diffusely severely diseased then subtotally occluded in the mid-vessel.  Left Circumflex  50% distal LCx stenosis. Diffuse moderate disease distal OM branches.  Right Coronary Artery  80% proximal RCA stenosis. 60% mid PDA stenosis.  Intervention  No interventions have been documented. Right Heart  Right Heart Pressures RHC Procedural Findings: Hemodynamics (mmHg) RA mean 6 RV 42/4 PA 48/20, mean 29 PCWP mean 7 LV 101/9 AO 101/56  Oxygen saturations: PA 73% AO 100%  Cardiac Output (Fick) 7.07  Cardiac Index (Fick) 3.7 PVR 3.1 WU      Objective:   Weight Range: 84.4 kg Body mass index is 30.96 kg/m.   Vital Signs:   Temp:  [97.7 F (36.5 C)-98.6 F (37 C)] 98 F (36.7 C) (10/07 0802) Pulse Rate:  [57-99] 93 (10/07 0514) Resp:  [10-29] 29 (10/07 0802) BP: (91-156)/(57-84) 125/67 (10/07 0802) SpO2:  [92 %-100 %] 93 % (10/07 0514) Weight:  [84.4 kg] 84.4 kg (10/07 0500) Last BM Date: 09/02/19  Weight change: Filed Weights   09/01/19 1948 09/02/19 0500 09/03/19 0500  Weight: 83.8 kg 83.9 kg 84.4 kg    Intake/Output:   Intake/Output Summary (Last 24 hours) at 09/03/2019 0842 Last data filed at 09/03/2019 0630 Gross per 24 hour  Intake 113.89 ml  Output 1700 ml   Net -1586.11 ml      Physical Exam    General: NAD Neck: No JVD, no thyromegaly or thyroid nodule.  Lungs: Clear to auscultation bilaterally with normal respiratory effort. CV: Nondisplaced PMI.  Heart regular S1/S2, no S3/S4, no murmur.  1+ ankle edema. Abdomen: Soft, nontender, no hepatosplenomegaly, no distention.  Skin: Intact without lesions or rashes.  Neurologic: Alert and oriented x 3.  Psych: Normal affect. Extremities: No clubbing or cyanosis. Ischemic changes right foot.  HEENT: Normal.   Telemetry   NSR 70s, personally reviewed.   Labs    CBC Recent Labs    09/01/19 0439  09/02/19 0526  09/02/19 2227 09/03/19 0503  WBC 6.8  --  6.9  --   --  7.5  NEUTROABS 4.7  --  4.7  --   --   --   HGB 8.5*   < > 9.8*   < > 8.8* 10.1*  HCT 24.0*   < > 29.0*   < > 27.1* 30.8*  MCV 93.4  --  94.8  --   --  97.5  PLT 209  --  231  --   --  259   < > = values in this interval not displayed.   Basic Metabolic Panel Recent Labs    24/23/53 1038 09/02/19 0526  09/02/19 1012 09/03/19 0503  NA  --  135   < > 140 137  K  --  3.8   < >  3.7 4.0  CL  --  103  --   --  105  CO2  --  23  --   --  22  GLUCOSE  --  76  --   --  69*  BUN  --  12  --   --  9  CREATININE  --  0.82  --   --  0.91  CALCIUM  --  7.5*  --   --  7.7*  MG 1.4* 1.9  --   --   --    < > = values in this interval not displayed.   Liver Function Tests Recent Labs    09/02/19 0526 09/03/19 0503  AST 41 42*  ALT 33 35  ALKPHOS 111 113  BILITOT 3.4* 3.3*  PROT 4.9* 5.3*  ALBUMIN 1.3* 1.3*   No results for input(s): LIPASE, AMYLASE in the last 72 hours. Cardiac Enzymes No results for input(s): CKTOTAL, CKMB, CKMBINDEX, TROPONINI in the last 72 hours.  BNP: BNP (last 3 results) No results for input(s): BNP in the last 8760 hours.  ProBNP (last 3 results) No results for input(s): PROBNP in the last 8760 hours.   D-Dimer No results for input(s): DDIMER in the last 72 hours. Hemoglobin A1C  No results for input(s): HGBA1C in the last 72 hours. Fasting Lipid Panel No results for input(s): CHOL, HDL, LDLCALC, TRIG, CHOLHDL, LDLDIRECT in the last 72 hours. Thyroid Function Tests No results for input(s): TSH, T4TOTAL, T3FREE, THYROIDAB in the last 72 hours.  Invalid input(s): FREET3  Other results:   Imaging    No results found.   Medications:     Scheduled Medications: . carvedilol  3.125 mg Oral BID WC  . chlorhexidine  15 mL Mouth/Throat BID  . Chlorhexidine Gluconate Cloth  6 each Topical Daily  . digoxin  0.125 mg Oral Daily  . feeding supplement  1 Container Oral TID BM  . folic acid  1 mg Oral Daily  . furosemide  40 mg Oral Daily  . gabapentin  200 mg Oral BID  . losartan  25 mg Oral BID  . mouth rinse  15 mL Mouth Rinse BID  . multivitamin with minerals  1 tablet Per Tube Daily  . nicotine  21 mg Transdermal Daily  . pantoprazole  40 mg Intravenous Q12H  . rifaximin  550 mg Oral BID  . sodium chloride flush  10 mL Intravenous Q12H  . sodium chloride flush  10 mL Intravenous Q12H  . sodium chloride flush  3 mL Intravenous Q12H  . sodium chloride flush  3 mL Intravenous Q12H  . spironolactone  25 mg Oral Daily  . thiamine  100 mg Oral Daily  . warfarin  1 mg Oral ONCE-1800  . Warfarin - Pharmacist Dosing Inpatient   Does not apply q1800    Infusions: . sodium chloride 10 mL/hr at 09/02/19 0318  . sodium chloride    . magnesium sulfate bolus IVPB      PRN Medications: sodium chloride, sodium chloride, acetaminophen, albuterol, dextromethorphan-guaiFENesin, hydrALAZINE, ondansetron (ZOFRAN) IV, oxyCODONE, sodium chloride flush, sodium chloride flush, white petrolatum   Assessment/Plan   1. Acute on chronic systolic CHF/cardiogenic shock: Echo with EF 15-20%, mild LV dilation, LV thrombus, mild-moderate MR, severely decreased RV systolic function.  LHC/RHC done 10/6 showing normal filling pressures and preserved cardiac output.  There was  diffuse coronary disease, especially involving distal and branch vessels, only possible interventional target was 80% proximal RCA stenosis.  No  intervention at this time given good flow down the vessel and unlikely to significantly improve LV function.  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  Initial cardiogenic shock, off dobutamine now.  Currently appears euvolemic. Co-ox 66% today.  - Continue Lasix 40 mg po daily.  Continue w/ Unna boots. - Continue spironolactone 25 mg daily.  - Continue digoxin 0.125.  - Add Coreg 3.125 mg bid.  - Continue losartan 25 mg bid, creatinine stable.  May be able to transition to [facility] soon, if BP allows.  - Not candidate for advanced therapies with ETOH abuse/liver failure.  2. Liver failure: ETOH hepatitis. Markedly elevated bilirubin initially with jaundice.  However liver imaging with [place] and CT has NOT shown cirrhosis or portal/hepatic vein thrombosis. Question has arisen whether this might possibly be due primarily to RV failure.  Time course seems quite acute for RV failure.  Jaundice/icterus improving and Tbili down to 3.3 - Holding statin and other hepato-toxic agents  3. Encephalopathy: NH3 has not been markedly high but certainly could be hepatic encephalopathy.  Shock likely played a role as well.  Now resolved.  - She continues on rifaximin, lactulose stopped.  4. AKI: Creatinine improved with inotropic support.  Suspect cardiorenal syndrome, also had contrast with LE angiograms so possible component of contrast nephropathy. Creatinine normalized. 5. LV thrombus: Possible cause of embolic event to popliteal arteries as well as subacute CVA. INR now therapeutic on warfarin.  - Continue warfarin.  - Can stop heparin gtt today.  6. PAD: Bilateral popliteal artery occlusions, suspect cardioembolic from LV thrombus.  Now s/p mechanical thrombectomy by vascular. Right foot still looks  ischemic but dopplerable pulses.  - Continue anticoagulation.  7. 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   - Continue anticoagulation.  8. Right popliteal vein DVT - Continue anticoagulation.  9. GI bleeding: Upper GI bleeding, EGD with bleeding duodenal and gastric ulcer on 9/29 that were treated, no varices. She had 1 unit PRBCs on 9/30 and 1 unit on 10/2. Hgb now stable with no evidence for active bleeding. - Continue Protonix.  10. ETOH abuse: Per husband, at least a 6 pack daily and usually more prior to admission. Plans to quit.  11. ID: Concern for aspiration PNA. - Completed course of ceftriaxone.  12. Vaginal bleeding: Resolved.  13. Deconditioning:  work with PT.  Wants to go home rather than to CIR.  14. NSVT: Starting Coreg today.  76. CAD: Extensive distal and branch vessel coronary disease on cath 10/6.  Only possible interventional target was 80% proximal RCA stenosis.  However, there is good flow down the RCA and I do not think intervention on this lesion would appreciably improve her LV function.  She has not had chest pain.  Would avoid adding antiplatelet therapy with recent GI bleeding.  - In future, once she has recovered from GI bleeding, could intervene on RCA if she has exertional chest pain that does not respond to medical management.  - No ASA with anticoagulation.  - Will need statin eventually, given liver disease would not give high dose.  Will discuss starting low dose Crestor 5 mg daily with GI.    Length of Stay: 20  Loralie Champagne, MD  09/03/2019, 8:42 AM

## 2019-09-03 NOTE — Progress Notes (Signed)
PROGRESS NOTE  Briana Andrews GUR:[id number] DOB: [birth year] DOA: 08/15/2019 PCP: Patient, [name]  Brief History   53 yo alcoholic woman admitted with R frontal CVA, B popliteal artery occlusions, hepatic failure, GI bleeding. She was found to have new cardiomyopathy with an LV thrombus, cirrhosis, ARF. Underwent B LE thrombectomies, was treated with heparin, stabilized. Has required hemodynamic support w dobutamine. Received PRBC and was able to undergo EGD 9/29 that showed duodenal and gastric ulcers which were treated endoscopically. Remains on dobuta, PPI gtt, heparin.   On 08/29/2019 the patient developed bleeding from multiple sites including her mouth, urine, and rectum. Pharmacy had reduced the dose and goal heparin level earlier in the day. I discussed with pharmacy. The heparin will be held for one hour and then restarted at yet an even lower dose. When it was restarted the patient again had bleeding with continued decline in Hgb. It was again placed on hold on 08/31/2019. It has been restarted again this morning. Monitor.  Transthoracic Echocardiogram repeated on 08/31/2019. Persistent appearance of large left ventricular mural thrombus.  The patient underwent left and right heart catheterization on 09/02/2019. Conclusions from this study were:  1. Optimized filling pressures.  2. Mild pulmonary hypertension.  3. Preserved cardiac output.  4. Extensive CAD, primarily distal/branch vessel disease.  The only interventional target would be 80% proximal RCA stenosis. Could consider RCA PCI if she has exertional symptoms despite medical therapy.  Consultants   Palliative Care  PCCM  Heart Failure Team  Vascular Surgery  Cardiology  Procedures   EGD/Epinephrine injection  Mechanical Intubation  Central Venous Catheter  Extubation  Arteriogram of right and left lower extremities  Mechanical thrombectomy of the left popliteal and posterior tibial artery using Penumbra CAT  6 device Mechanical thrombectomy of the right popliteal and posterior tibial artery using Penumbra CAT 6 device  Antibiotics   Anti-infectives (From admission, onward)   Start     Dose/Rate Route Frequency Ordered Stop   08/28/19 2200  rifaximin (XIFAXAN) tablet 550 mg     550 mg Oral 2 times daily 08/28/19 0934     08/26/19 1000  rifaximin (XIFAXAN) tablet 550 mg  Status:  Discontinued     550 mg Per Tube 2 times daily 08/25/19 2113 08/28/19 0935   08/20/19 1800  cefTRIAXone (ROCEPHIN) 2 g in sodium chloride 0.9 % 100 mL IVPB  Status:  Discontinued     2 g 200 mL/hr over 30 Minutes Intravenous Every 24 hours 08/20/19 1600 08/23/19 0825   08/18/19 0700  levofloxacin (LEVAQUIN) tablet 750 mg  Status:  Discontinued     750 mg Oral Every 48 hours 08/17/19 1205 08/20/19 1600   08/16/19 1115  rifaximin (XIFAXAN) tablet 550 mg  Status:  Discontinued     550 mg Oral 2 times daily 08/16/19 1114 08/25/19 2113   08/16/19 0700  levofloxacin (LEVAQUIN) IVPB 750 mg  Status:  Discontinued     750 mg 100 mL/hr over 90 Minutes Intravenous Every 48 hours 08/16/19 0616 08/17/19 1205     Subjective  The patient is resting comfortably. No new complaints. She refused pelvic ultrasound this morning, but now says that she will do it.  Objective   Vitals:  Vitals:   09/03/19 0514 09/03/19 0802  BP: 108/71 125/67  Pulse: 93   Resp: 20 (!) 29  Temp: 98.6 F (37 C) 98 F (36.7 C)  SpO2: 93%    Exam:  Constitutional:   The patient is awake,  alert, and oriented x 3. No acute distress. Respiratory:   No increased work of breathing.  No wheezes, rales, or rhonchi  No tactile fremitus Cardiovascular:   Regular rate and rhythm  No murmurs, ectopy, or gallups.  No lateral PMI. No thrills. Abdomen:   Abdomen is soft, non-tender, non-distended  No hernias, masses, or organomegaly  Normoactive bowel sounds.  Musculoskeletal:   No cyanosis, clubbing, or edema Skin:   No rashes,  lesions, ulcers  palpation of skin: no induration or nodules Neurologic:   CN 2-12 intact  Sensation all 4 extremities intact Psychiatric:   Mental status o Mood: Depressed, affect flat o Orientation to person, place, time   judgment and insight appear intact  I have personally reviewed the following:   Today's Data   Vitals, CBC, CMP  Scheduled Meds:  carvedilol  3.125 mg Oral BID WC   chlorhexidine  15 mL Mouth/Throat BID   Chlorhexidine Gluconate Cloth  6 each Topical Daily   digoxin  0.125 mg Oral Daily   feeding supplement  1 Container Oral TID BM   folic acid  1 mg Oral Daily   furosemide  40 mg Oral Daily   gabapentin  200 mg Oral BID   losartan  25 mg Oral BID   mouth rinse  15 mL Mouth Rinse BID   multivitamin with minerals  1 tablet Per Tube Daily   nicotine  21 mg Transdermal Daily   pantoprazole  40 mg Oral BID   rifaximin  550 mg Oral BID   sodium chloride flush  10 mL Intravenous Q12H   sodium chloride flush  10 mL Intravenous Q12H   sodium chloride flush  3 mL Intravenous Q12H   sodium chloride flush  3 mL Intravenous Q12H   spironolactone  25 mg Oral Daily   thiamine  100 mg Oral Daily   warfarin  1 mg Oral ONCE-1800   Warfarin - Pharmacist Dosing Inpatient   Does not apply q1800   Continuous Infusions:  sodium chloride 10 mL/hr at 09/02/19 0318   sodium chloride     magnesium sulfate bolus IVPB      Principal Problem:   Cerebral embolism with cerebral infarction Active Problems:   Abnormal LFTs   Acute metabolic encephalopathy   SOB (shortness of breath)   AKI (acute kidney injury) (HCC)   Fall   Lactic acidosis   Back pain   Liver failure without hepatic coma (HCC)   Multifocal pneumonia   PVD (peripheral vascular disease) (HCC)   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 Dobutamine weaned to off.  The patient was successfully extubated after EGD yesterday and respiratory status is stable on 4L [place]. The patient underwent LHC and RHC today. Conclusions:  1. Optimized filling pressures.  2. Mild pulmonary hypertension.  3. Preserved cardiac output.  4. Extensive CAD, primarily distal/branch vessel disease.  The only interventional target would be 80% proximal RCA stenosis. Could consider RCA PCI  if she has exertional symptoms despite medical therapy.  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 As per cardiology.  Hemorrhage from mouth, urine, and rectum per nursing: Pharmacy had reduced the dose and goal heparin level earlier in the day. I discussed with pharmacy. The heparin will be held for one hour and then restarted at yet an even lower dose. Again this morning nursing is reporting a large amount of blood underneath the patient assumed to be from her rectum or possibly her vagina. Hemoglobin has dropped from 10.4 to 8.7 this morning. Cardiology aware, and states that pt continues to require heparin, although it may be held intermittently. GI also aware, but does not feel that any intervention is necessary. Heparin as per cardiology.  Abnormal uterine bleeding. Complete pelvic ultrasound was ordered for today to rule out abnormal appearance of the uterine lining, however, the patient declined the study. She now states that she will agree. I will re-order.  LV clot on AC:  ASA 81 mg daily. Continue heparin drip. Monitor CBC.  GI Bleed: The patient presented with melena with  suspicion for upper GI source, intermittent transfusion requirements. EGD demonstrated oozing 57mm pre-pyloric ulcer. This was injected with Epi. Hgb 7.5 overnight and received 1 unit PRBC's. Hemoglobin dropped to 7 again on 08/29/2019 after observation of bleeding in mouth, in urine, and in stool. Heparin is held for one hour and dose reduced for the second time. The patient was transfused with 2 units PRBC's. Bleeding stopped overnight. Hemoglobin now stable. Monitor.  Alcoholism, hyperbilirubinemia, clinically she has cirrhosis although imaging features not c/w this; LFT pattern not c/w alcoholic hepatitis nor ischemic nor congestive. We will continue lactulose, rifaximin, thiamine and folate.  Encephalopathy: Combination from critical illness, hepatic, and CVA, still has asterixis, improving overall. Continue lactulose.  AKI: Improving related to initial shock, blood loss.  CVA: likely secondary to emboli from LV clot.  BL femoral artery occlusion s/p BL thrombectomy:  RLE does not look great but dopplerable pulses, vascular surgery aware, poor candidate for further procedures, monitor clinically.  Severe protein calorie malnutrition due to alcoholism: Nutrition Consult.  Rt sided pleural effusion without resp distress: likely due to hepatic hydrothorax vs. distrubutive from poor oncotic pressure. No indication to drain.  I have seen and examined this patient myself. I have spent 35 minutes in her evaluation and care.  Briana Hottel, DO [facility] Direct contact: see [email or web address]  7PM-7AM contact night coverage as above 09/03/2019, 2:47 PM  LOS: 12 days

## 2019-09-03 NOTE — Progress Notes (Signed)
Physical Therapy Treatment Patient Details Name: Briana Andrews MRN: [id number] DOB: 06-23-1966 Today's Date: 09/03/2019    History of Present Illness 53 year old female with history of smoking, alcohol use, she drinks more than 6 packs of beer every day since last 20 years, no chronic medical issues brought to the emergency room with confusion more than usual and falls.  Since last few months there have been noticing some changes on her including weakness on her legs and poor appetite as much as she stopped smoking and decreased her drinking.  2 weeks ago, she was taken to [facility] with a fall, skeletal survey was negative except L1 fracture with 25% height loss and discharged home on muscle relaxants.  Patient has been intermittently confused since then. In the emergency room, she was found with subacute right ACA stroke, jaundice with bilirubin of 12, bilateral multifocal pneumonia and metabolic encephalopathy.s/p cardiac cath 10/6. Echo 10/4- LV thrombus.    PT Comments    Pt with improved affect today, motivated to participate in PT this session. Pt required mod assist for bed mobility and transfer to recliner, limited by gluteal pain. Pt required increased time to position in recliner due to buttocks pain, PT recommending pillow offloading as well as pressure relief technique with leaning in chair. Pt agreeable. PT continuing to recommend CIR, but CIR has signed off once again. SNF as alternative.    Follow Up Recommendations  CIR;Supervision/Assistance - 24 hour(CIR declines pt; SNF if CIR continues to decline)     Equipment Recommendations  Other (comment)(defer)    Recommendations for Other Services       Precautions / Restrictions Precautions Precautions: Fall Precaution Comments: flexiseal, foley, wound on Rt foot, bleeding Restrictions Weight Bearing Restrictions: No    Mobility  Bed Mobility Overal bed mobility: Needs Assistance Bed Mobility:  Rolling;Supine to Sit Rolling: Mod assist   Supine to sit: Mod assist;HOB elevated     General bed mobility comments: Mod assist for rolling to R for placement of pillow under buttocks to offweight. Mod assist for supine to sit for LE lifting and translation to EOB, trunk elevation, and scooting to EOB with use of bed pads.  Transfers Overall transfer level: Needs assistance Equipment used: Rolling walker (2 wheeled) Transfers: Sit to/from [facility] Sit to Stand: Mod assist;From elevated surface Stand pivot transfers: Min assist       General transfer comment: Mod assist for power up, steadying, VC for hand placement when rising. Min assist for stand pivot to recliner at bedside.  Ambulation/Gait Ambulation/Gait assistance: (NT)               Stairs             Wheelchair Mobility    Modified Rankin (Stroke Patients Only)       Balance Overall balance assessment: Needs assistance Sitting-balance support: Feet supported;No upper extremity supported Sitting balance-Leahy Scale: Fair Sitting balance - Comments: supervision for safety   Standing balance support: During functional activity Standing balance-Leahy Scale: Poor Standing balance comment: reliant on external support                            Cognition Arousal/Alertness: Lethargic Behavior During Therapy: Flat affect Overall Cognitive Status: No family/caregiver present to determine baseline cognitive functioning Area of Impairment: Following commands;Problem solving;Awareness  Following Commands: Follows one step commands with increased time;Follows one step commands consistently;Follows multi-step commands inconsistently   Awareness: Emergent Problem Solving: Slow processing;Decreased initiation;Difficulty sequencing;Requires verbal cues;Requires tactile cues General Comments: Pt slightly more emotive with PT today vs yesterday,  agreeable to mobility. Pt does require increased time to process mobility cuing      Exercises Other Exercises Other Exercises: Pt educated on pressure relief with pillows and inflatable cushion    General Comments General comments (skin integrity, edema, etc.): VSS      Pertinent Vitals/Pain Pain Assessment: Faces Faces Pain Scale: Hurts even more Pain Location: buttocks, perineum Pain Descriptors / Indicators: Grimacing;Moaning;Sore Pain Intervention(s): Limited activity within patient's tolerance;Monitored during session;Repositioned;Premedicated before session    Home Living                      Prior Function            PT Goals (current goals can now be found in the care plan section) Acute Rehab PT Goals Patient Stated Goal: none stated PT Goal Formulation: With patient/family Time For Goal Achievement: 09/10/19 Potential to Achieve Goals: Good Progress towards PT goals: Progressing toward goals    Frequency    Min 3X/week      PT Plan Current plan remains appropriate    Co-evaluation              AM-PAC PT "6 Clicks" Mobility   Outcome Measure  Help needed turning from your back to your side while in a flat bed without using bedrails?: A Little Help needed moving from lying on your back to sitting on the side of a flat bed without using bedrails?: A Lot Help needed moving to and from a bed to a chair (including a wheelchair)?: A Lot Help needed standing up from a chair using your arms (e.g., wheelchair or bedside chair)?: A Lot Help needed to walk in hospital room?: Total Help needed climbing 3-5 steps with a railing? : Total 6 Click Score: 11    End of Session Equipment Utilized During Treatment: Gait belt Activity Tolerance: Patient limited by pain;Patient limited by fatigue Patient left: with call bell/phone within reach;in chair(pt verbally agrees not to mobilize without assist, chair alarm left off so pt can weight shift off  buttocks as needed) Nurse Communication: Mobility status;Other (comment)(pt not on chair alarm) PT Visit Diagnosis: Other abnormalities of gait and mobility (R26.89);Pain;Muscle weakness (generalized) (M62.81);Difficulty in walking, not elsewhere classified (R26.2) Pain - Right/Left: (both) Pain - part of body: (buttocks)     Time: 1530-1605 PT Time Calculation (min) (ACUTE ONLY): 35 min  Charges:  $Therapeutic Activity: 23-37 mins                     Nicola Police, PT Acute Rehabilitation Services Pager [phone]  Office [phone]   Anahli Arvanitis D Ayaan Ringle 09/03/2019, 4:44 PM

## 2019-09-03 NOTE — Progress Notes (Signed)
ANTICOAGULATION CONSULT NOTE - Follow Up Consult  Pharmacy Consult for Heparin> warfarin Indication: DVT, LV thrombus, and acute CVA  Allergies  Allergen Reactions  . Penicillins Other (See Comments)    Unsure of reaction Did it involve swelling of the face/tongue/throat, SOB, or low BP? Unknown Did it involve sudden or severe rash/hives, skin peeling, or any reaction on the inside of your mouth or nose? Unknown Did you need to seek medical attention at a hospital or doctor's office? Unknown When did it last happen?[place] If all above answers are "NO", may proceed with cephalosporin use.    Patient Measurements: Height: 5\' 5"  (165.1 cm) Weight: 186 lb 1.1 oz (84.4 kg) IBW/kg (Calculated) : 57 Heparin Dosing Weight: 75 kg  Vital Signs: Temp: 98 F (36.7 C) (10/07 0802) Temp Source: Oral (10/07 0802) BP: 125/67 (10/07 0802) Pulse Rate: 93 (10/07 0514)  Labs: Recent Labs    09/01/19 0439  09/01/19 1038  09/01/19 2100  09/02/19 0526  09/02/19 1427 09/02/19 2227 09/03/19 0503  HGB 8.5*  --   --    < >  --    < > 9.8*   < > 9.1* 8.8* 10.1*  HCT 24.0*  --   --    < >  --    < > 29.0*   < > 26.7* 27.1* 30.8*  PLT 209  --   --   --   --   --  231  --   --   --  259  LABPROT  --   --  21.2*  --   --   --   --   --   --   --  24.8*  INR  --   --  1.9*  --   --   --   --   --   --   --  2.3*  HEPARINUNFRC  --    < >  --   --  <0.10*  --  0.32  --   --   --  0.24*  CREATININE 0.79  --   --   --   --   --  0.82  --   --   --  0.91   < > = values in this interval not displayed.    Estimated Creatinine Clearance: 75.9 mL/min (by C-G formula based on SCr of 0.91 mg/dL).   Assessment: [name] on heparin for LV thrombus and bilateral lower extremity ischemia w/ right popliteal vein DVT now s/p angioplasty and R/L popliteal thrombectomy of the tibial artery. Heparin therapy complicated by acute CVA and recent GI bleed and need for lower goal. Plans noted for coumadin when no  further procedures   She is s/p mech thrombectomy of L popliteal 9/23. Heparin stopped 09/23 @ 1750 due to a GI bleed. Pt completed course of octreotide. Pt placed on IV PPI for treatment of ulcers. Heparin to continue due to LV thrombus per Dr. 10/23.  Heparin drip 1100 uts/hr HL 0.24 this am but INR > 2 - stop heparin INR baseline 1.9>2.3 with warfarin 2mg  x1 will use very low dose to try to keep INR 2-3 States she will refrain from ETOH after DC   S/p cath NICM, CAD no intervention  Goal of Therapy:   INR 2-3 Monitor platelets by anticoagulation protocol: Yes   Plan: Stop heparin. Warfarin 1mg  x1 tonight  Daily Protime Monitor s/s bleeding  Shirlee Latch Pharm.D. CPP, BCPS Clinical Pharmacist [phone] 09/03/2019 10:01  AM

## 2019-09-04 LAB — CBC
HCT: 25 % — ABNORMAL LOW (ref 36.0–46.0)
Hemoglobin: 8.3 g/dL — ABNORMAL LOW (ref 12.0–15.0)
MCH: 32.3 pg (ref 26.0–34.0)
MCHC: 33.2 g/dL (ref 30.0–36.0)
MCV: 97.3 fL (ref 80.0–100.0)
Platelets: 247 10*3/uL (ref 150–400)
RBC: 2.57 MIL/uL — ABNORMAL LOW (ref 3.87–5.11)
RDW: 18.7 % — ABNORMAL HIGH (ref 11.5–15.5)
WBC: 5.2 10*3/uL (ref 4.0–10.5)

## 2019-09-04 LAB — BASIC METABOLIC PANEL
Anion gap: 8 (ref 5–15)
BUN: 8 mg/dL (ref 6–20)
CO2: 26 mmol/L (ref 22–32)
Calcium: 7.3 mg/dL — ABNORMAL LOW (ref 8.9–10.3)
Chloride: 100 mmol/L (ref 98–111)
Creatinine, Ser: 0.78 mg/dL (ref 0.44–1.00)
GFR calc Af Amer: >60 mL/min (ref >60)
GFR calc non Af Amer: >60 mL/min (ref >60)
Glucose, Bld: 87 mg/dL (ref 70–99)
Potassium: 3.2 mmol/L — ABNORMAL LOW (ref 3.5–5.1)
Sodium: 134 mmol/L — ABNORMAL LOW (ref 135–145)

## 2019-09-04 LAB — COOXEMETRY PANEL
Carboxyhemoglobin: 1.7 % — ABNORMAL HIGH (ref 0.5–1.5)
Methemoglobin: 0.8 % (ref 0.0–1.5)
O2 Saturation: 79.1 %
Total hemoglobin: 8.5 g/dL — ABNORMAL LOW (ref 12.0–16.0)

## 2019-09-04 LAB — PROTIME-INR
INR: 2.8 — ABNORMAL HIGH (ref 0.8–1.2)
Prothrombin Time: 28.8 seconds — ABNORMAL HIGH (ref 11.4–15.2)

## 2019-09-04 LAB — HEMOGLOBIN AND HEMATOCRIT, BLOOD
HCT: 26.7 % — ABNORMAL LOW (ref 36.0–46.0)
HCT: 25.3 % — ABNORMAL LOW (ref 36.0–46.0)
Hemoglobin: 8.9 g/dL — ABNORMAL LOW (ref 12.0–15.0)
Hemoglobin: 8.7 g/dL — ABNORMAL LOW (ref 12.0–15.0)

## 2019-09-04 MED ORDER — LOSARTAN POTASSIUM 25 MG PO TABS: 25.0000 mg | ORAL_TABLET | Freq: Two times a day (BID) | ORAL | Status: DC

## 2019-09-04 MED ORDER — SPIRONOLACTONE 12.5 MG HALF TABLET
12.5000 mg | ORAL_TABLET | Freq: Every day | ORAL | Status: DC
  Administered 2019-09-04: 12.5 mg via ORAL
  Filled 2019-09-04: qty 1

## 2019-09-04 MED ORDER — ROSUVASTATIN CALCIUM 5 MG PO TABS
5.0000 mg | ORAL_TABLET | Freq: Every day | ORAL | Status: DC
  Administered 2019-09-04 – 2019-09-12 (×9): 5 mg via ORAL
  Filled 2019-09-04 (×9): qty 1

## 2019-09-04 MED ORDER — POTASSIUM CHLORIDE CRYS ER 20 MEQ PO TBCR
40.0000 meq | EXTENDED_RELEASE_TABLET | Freq: Four times a day (QID) | ORAL | Status: AC
  Administered 2019-09-04 (×2): 40 meq via ORAL
  Filled 2019-09-04 (×2): qty 2

## 2019-09-04 MED ORDER — LOSARTAN POTASSIUM 25 MG PO TABS
25.0000 mg | ORAL_TABLET | Freq: Every day | ORAL | Status: DC
  Administered 2019-09-04 – 2019-09-09 (×6): 25 mg via ORAL
  Filled 2019-09-04 (×6): qty 1

## 2019-09-04 MED ORDER — WARFARIN SODIUM 1 MG PO TABS
1.0000 mg | ORAL_TABLET | Freq: Once | ORAL | Status: AC
  Administered 2019-09-04: 1 mg via ORAL
  Filled 2019-09-04: qty 1

## 2019-09-04 NOTE — Progress Notes (Signed)
All maintained on CVP line is done by Wyman Songster.N.

## 2019-09-04 NOTE — Progress Notes (Signed)
All CVP maintance and monitoring will be done by Butch Penny R.N..

## 2019-09-04 NOTE — Progress Notes (Signed)
ANTICOAGULATION CONSULT NOTE - Follow Up Consult  Pharmacy Consult for Heparin> warfarin Indication: DVT, LV thrombus, and acute CVA  Allergies  Allergen Reactions  . Penicillins Other (See Comments)    Unsure of reaction Did it involve swelling of the face/tongue/throat, SOB, or low BP? Unknown Did it involve sudden or severe rash/hives, skin peeling, or any reaction on the inside of your mouth or nose? Unknown Did you need to seek medical attention at a hospital or doctor's office? Unknown When did it last happen?[place] If all above answers are "NO", may proceed with cephalosporin use.    Patient Measurements: Height: 5\' 5"  (165.1 cm) Weight: 186 lb 1.1 oz (84.4 kg) IBW/kg (Calculated) : 57 Heparin Dosing Weight: 75 kg  Vital Signs: Temp: 98.2 F (36.8 C) (10/08 0425) Temp Source: Oral (10/08 0425) BP: 115/60 (10/08 0425) Pulse Rate: 78 (10/08 0425)  Labs: Recent Labs    09/01/19 1038  09/01/19 2100  09/02/19 0526  09/03/19 0503 09/03/19 1806 09/03/19 2239 09/04/19 0342  HGB  --    < >  --    < > 9.8*   < > 10.1* 10.0* 9.5* 8.3*  HCT  --    < >  --    < > 29.0*   < > 30.8* 29.4* 27.5* 25.0*  PLT  --   --   --   --  231  --  259  --   --  247  LABPROT 21.2*  --   --   --   --   --  24.8*  --   --  28.8*  INR 1.9*  --   --   --   --   --  2.3*  --   --  2.8*  HEPARINUNFRC  --   --  <0.10*  --  0.32  --  0.24*  --   --   --   CREATININE  --   --   --   --  0.82  --  0.91  --   --  0.78   < > = values in this interval not displayed.    Estimated Creatinine Clearance: 86.3 mL/min (by C-G formula based on SCr of 0.78 mg/dL).   Assessment: [name] on heparin for LV thrombus and bilateral lower extremity ischemia w/ right popliteal vein DVT now s/p angioplasty and R/L popliteal thrombectomy of the tibial artery. Heparin therapy complicated by acute CVA and recent GI bleed and need for lower goal. Plans noted for coumadin when no further procedures   She is  s/p mech thrombectomy of L popliteal 9/23. Heparin stopped 09/23 @ 1750 due to a GI bleed. Pt completed course of octreotide. Pt placed on IV PPI for treatment of ulcers. Heparin to continue due to LV thrombus per Dr. Aundra Dubin.  Heparin stopped 10/7.  INR baseline 1.9>2.3>2.8 with warfarin 2 mg then 1 mg,  will use very low dose to try to keep INR 2-3 States she will refrain from ETOH after DC   S/p cath NICM, CAD no intervention  Goal of Therapy:   INR 2-3 Monitor platelets by anticoagulation protocol: Yes   Plan: Warfarin 1 mg x1 tonight  Daily PT/INR Monitor s/s bleeding  Marguerite Olea, [facility] Clinical Pharmacist Phone [phone]  09/04/2019 7:59 AM

## 2019-09-04 NOTE — Progress Notes (Addendum)
Patient ID: Briana Andrews, female   DOB: 1966/07/18, 53 y.o.   MRN: [id number]     Advanced Heart Failure Rounding Note  PCP-Cardiologist: Tobias Alexander, MD   Subjective:    Much more alert today. Sitting up in bed. Thinks that she just "passed out" briefly in bed. Tele reviewed. Transient bradycardia w/ HR of 46 @7 :46 but no pauses. HR now in the 70s.   Co-ox 79%.   S/p EGD (9/29): Bleeding gastric and duodenal ulcers treated in endoscopy suite with resolution of bleeding. No varices.  She got 1 unit PRBCs 10/2. Hemoglobin 8.3. No overt bleeding.   LHC/RHC (10/6):  Coronary Findings  Diagnostic Dominance: Right Left Main  Short, no significant disease.  Left Anterior Descending  40% mid LAD stenosis after D2, diffuse moderate luminal irregularities distal LAD.  Ramus Intermedius  Small caliber vessel, appears to be diffusely severely diseased then subtotally occluded in the mid-vessel.  Left Circumflex  50% distal LCx stenosis. Diffuse moderate disease distal OM branches.  Right Coronary Artery  80% proximal RCA stenosis. 60% mid PDA stenosis.  Intervention  No interventions have been documented. Right Heart  Right Heart Pressures RHC Procedural Findings: Hemodynamics (mmHg) RA mean 6 RV 42/4 PA 48/20, mean 29 PCWP mean 7 LV 101/9 AO 101/56  Oxygen saturations: PA 73% AO 100%  Cardiac Output (Fick) 7.07  Cardiac Index (Fick) 3.7 PVR 3.1 WU      Objective:   Weight Range: 84.4 kg Body mass index is 30.96 kg/m.   Vital Signs:   Temp:  [98 F (36.7 C)-98.5 F (36.9 C)] 98.2 F (36.8 C) (10/08 0425) Pulse Rate:  [77-99] 78 (10/08 0425) Resp:  [18-29] 23 (10/08 0425) BP: (85-125)/(55-68) 115/60 (10/08 0425) SpO2:  [93 %-95 %] 94 % (10/08 0425) Last BM Date: 09/02/19  Weight change: Filed Weights   09/01/19 1948 09/02/19 0500 09/03/19 0500  Weight: 83.8 kg 83.9 kg 84.4 kg    Intake/Output:   Intake/Output Summary (Last 24 hours) at 09/04/2019  0745 Last data filed at 09/04/2019 0814 Gross per 24 hour  Intake 360 ml  Output 1400 ml  Net -1040 ml      Physical Exam    PHYSICAL EXAM: General:  disheveled appearing middle aged WF. No respiratory difficulty HEENT: normal Neck: supple. Swan cath in place. no JVD. Carotids 2+ bilat; no bruits. No lymphadenopathy or thyromegaly appreciated. Cor: PMI nondisplaced. Regular rate & rhythm. No rubs, gallops. 2/6 murmur at RUSB Lungs: clear Abdomen: soft, nontender, nondistended. No hepatosplenomegaly. No bruits or masses. Good bowel sounds. Extremities: no cyanosis, clubbing, rash, 1+ bilateral LEE Neuro: alert & oriented x 3, cranial nerves grossly intact. moves all 4 extremities w/o difficulty. Affect pleasant.   Telemetry   NSR. HR 70s. 4 beat run of NSVT. Brief bradycardia 46 bpm this am. No pauses.   Labs    CBC Recent Labs    09/02/19 0526  09/03/19 0503  09/03/19 2239 09/04/19 0342  WBC 6.9  --  7.5  --   --  5.2  NEUTROABS 4.7  --   --   --   --   --   HGB 9.8*   < > 10.1*   < > 9.5* 8.3*  HCT 29.0*   < > 30.8*   < > 27.5* 25.0*  MCV 94.8  --  97.5  --   --  97.3  PLT 231  --  259  --   --  247   < > =  values in this interval not displayed.   Basic Metabolic Panel Recent Labs    09/01/19 1038  09/02/19 0526  09/03/19 0503 09/04/19 0342  NA  --   --  135   < > 137 134*  K  --   --  3.8   < > 4.0 3.2*  CL  --    < > 103  --  105 100  CO2  --    < > 23  --  22 26  GLUCOSE  --    < > 76  --  69* 87  BUN  --    < > 12  --  9 8  CREATININE  --    < > 0.82  --  0.91 0.78  CALCIUM  --    < > 7.5*  --  7.7* 7.3*  MG 1.4*  --  1.9  --   --   --    < > = values in this interval not displayed.   Liver Function Tests Recent Labs    09/02/19 0526 09/03/19 0503  AST 41 42*  ALT 33 35  ALKPHOS 111 113  BILITOT 3.4* 3.3*  PROT 4.9* 5.3*  ALBUMIN 1.3* 1.3*   No results for input(s): LIPASE, AMYLASE in the last 72 hours. Cardiac Enzymes No results for  input(s): CKTOTAL, CKMB, CKMBINDEX, TROPONINI in the last 72 hours.  BNP: BNP (last 3 results) No results for input(s): BNP in the last 8760 hours.  ProBNP (last 3 results) No results for input(s): PROBNP in the last 8760 hours.   D-Dimer No results for input(s): DDIMER in the last 72 hours. Hemoglobin A1C No results for input(s): HGBA1C in the last 72 hours. Fasting Lipid Panel No results for input(s): CHOL, HDL, LDLCALC, TRIG, CHOLHDL, LDLDIRECT in the last 72 hours. Thyroid Function Tests No results for input(s): TSH, T4TOTAL, T3FREE, THYROIDAB in the last 72 hours.  Invalid input(s): FREET3  Other results:   Imaging    US Pelvis (transabdominal Only)  Result Date: 09/03/2019 CLINICAL DATA:  Vaginal bleeding for 2 days EXAM: TRANSABDOMINAL ULTRASOUND OF PELVIS TECHNIQUE: Transabdominal ultrasound examination of the pelvis was performed including evaluation of the uterus, ovaries, adnexal regions, and pelvic cul-de-sac. Patient refused transvaginal imaging. COMPARISON:  None FINDINGS: Uterus Uterus is not visualized likely due to decompression of urinary bladder by Foley catheter and poor acoustic window. Numerous bowel loops in pelvis. Endometrium Uterus and endometrium not visualized due to bowel gas and decompressed bladder Right ovary Not visualized on either transabdominal or endovaginal imaging, likely obscured by bowel Left ovary Not visualized on either transabdominal or endovaginal imaging, likely obscured by bowel Other findings: No free fluid. No obvious pelvic masses. Foley catheter seen within decompressed bladder. IMPRESSION: Nondiagnostic exam due to decompressed urinary bladder related to Foley catheter and subsequent poor acoustic window. Numerous bowel loops in pelvis with shadowing. Patient refused transvaginal imaging. Electronically Signed   By: Lavonia Dana M.D.   On: 09/03/2019 12:59   US Pelvic Complete With Transvaginal  Result Date: 09/03/2019 CLINICAL  DATA:  Abnormal vaginal bleeding EXAM: TRANSABDOMINAL AND TRANSVAGINAL ULTRASOUND OF PELVIS TECHNIQUE: Both transabdominal and transvaginal ultrasound examinations of the pelvis were performed. Transabdominal technique was performed for global imaging of the pelvis including uterus, ovaries, adnexal regions, and pelvic cul-de-sac. It was necessary to proceed with endovaginal exam following the transabdominal exam to visualize the adnexa and uterus. COMPARISON:  09/03/2019 FINDINGS: Uterus Measurements: Approximately 7.3 x 4.2 x 4.9 cm =  volume: 78.8 mL. Poorly visualized due to Foley catheter and rectal tube. Endometrium Unable to be visualized or measures Right ovary Probably visualized trans abdominally. Measurements: 1.5 x 1.4 x 1.4 cm = volume: 1.6 mL. No obvious mass. Left ovary Probably visualized trans abdominally. Measurements: 2.7 x 1 x 2.6 cm = volume: 4 mL. No gross mass. Other findings No abnormal free fluid. IMPRESSION: 1. Very limited study. The uterus and adnexa are poorly visible due to the presence of Foley catheter and what appears to be rectal tubing. The endometrial stripe could not be confidently visualized or measured. Electronically Signed   By: Jasmine Pang M.D.   On: 09/03/2019 21:19     Medications:     Scheduled Medications:  carvedilol  3.125 mg Oral BID WC   chlorhexidine  15 mL Mouth/Throat BID   Chlorhexidine Gluconate Cloth  6 each Topical Daily   digoxin  0.125 mg Oral Daily   feeding supplement  1 Container Oral TID BM   folic acid  1 mg Oral Daily   furosemide  40 mg Oral Daily   gabapentin  200 mg Oral BID   losartan  25 mg Oral BID   mouth rinse  15 mL Mouth Rinse BID   multivitamin with minerals  1 tablet Per Tube Daily   nicotine  21 mg Transdermal Daily   pantoprazole  40 mg Oral BID   rifaximin  550 mg Oral BID   sodium chloride flush  10 mL Intravenous Q12H   sodium chloride flush  10 mL Intravenous Q12H   sodium chloride flush  3 mL  Intravenous Q12H   sodium chloride flush  3 mL Intravenous Q12H   spironolactone  25 mg Oral Daily   thiamine  100 mg Oral Daily   Warfarin - Pharmacist Dosing Inpatient   Does not apply q1800    Infusions:  sodium chloride 10 mL/hr at 09/02/19 0318   sodium chloride     magnesium sulfate bolus IVPB      PRN Medications: sodium chloride, sodium chloride, acetaminophen, albuterol, dextromethorphan-guaiFENesin, hydrALAZINE, ondansetron (ZOFRAN) IV, oxyCODONE, sodium chloride flush, sodium chloride flush, white petrolatum   Assessment/Plan   1. Acute on chronic systolic CHF/cardiogenic shock: Echo with EF 15-20%, mild LV dilation, LV thrombus, mild-moderate MR, severely decreased RV systolic function.  LHC/RHC done 10/6 showing normal filling pressures and preserved cardiac output.  There was diffuse coronary disease, especially involving distal and branch vessels, only possible interventional target was 80% proximal RCA stenosis.  No intervention at this time given good flow down the vessel and unlikely to significantly improve LV function.  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  Initial cardiogenic shock, off dobutamine now.  Currently appears euvolemic. Co-ox 79% today.  - Continue Lasix 40 mg po daily. 1.4L out yesterday. Continue w/ Unna boots. - Continue spironolactone 12.5 mg daily.  - Continue digoxin 0.125.  - Continue Coreg 3.125 mg bid.  - It appears she has not been getting scheduled losartan. BP ok. Plan to resume today, 25 mg. Discussed w/ RN. If BP remains stable, Consider transition to [facility]. - Not candidate for advanced therapies with ETOH abuse/liver failure.  2. Liver failure: ETOH hepatitis. Markedly elevated bilirubin initially with jaundice.  However liver imaging with [place] and CT has NOT shown cirrhosis or portal/hepatic vein thrombosis. Question has arisen whether this might  possibly be due primarily to RV failure.  Time  course seems quite acute for RV failure.  Jaundice/icterus improving and Tbili down to 3.3 - Holding statin and other hepato-toxic agents  3. Encephalopathy: NH3 has not been markedly high but certainly could be hepatic encephalopathy.  Shock likely played a role as well.  Now resolved.  - She continues on rifaximin, lactulose stopped.  4. AKI: Creatinine improved with inotropic support.  Suspect cardiorenal syndrome, also had contrast with LE angiograms so possible component of contrast nephropathy. Creatinine normalized. 5. LV thrombus: Possible cause of embolic event to popliteal arteries as well as subacute CVA. INR now therapeutic on warfarin. 2.8 today. Heparin discontinued.  - Continue warfarin.  6. PAD: Bilateral popliteal artery occlusions, suspect cardioembolic from LV thrombus.  Now s/p mechanical thrombectomy by vascular. Right foot still looks ischemic but dopplerable pulses.  - Continue anticoagulation.  7. 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   - Continue anticoagulation.  8. Right popliteal vein DVT - Continue anticoagulation.  9. GI bleeding: Upper GI bleeding, EGD with bleeding duodenal and gastric ulcer on 9/29 that were treated, no varices. She had 1 unit PRBCs on 9/30 and 1 unit on 10/2. Slight drop in Hgb, down from 9.5>>8.3 - Continue Protonix.  10. ETOH abuse: Per husband, at least a 6 pack daily and usually more prior to admission. Plans to quit.  11. ID: Concern for aspiration PNA. - Completed course of ceftriaxone.  12. Vaginal bleeding: Resolved.  13. Deconditioning:  work with PT.  Wants to go home rather than to CIR.  14. NSVT: 4 beat run on tele. Continue Coreg. Give K supplementation for hypokalemia (3.2).  15. CAD: Extensive distal and branch vessel coronary disease on cath 10/6.  Only possible interventional target was 80% proximal RCA stenosis.  However, there is good flow down the RCA  and I do not think intervention on this lesion would appreciably improve her LV function.  She has not had chest pain.  Would avoid adding antiplatelet therapy with recent GI bleeding.  - In future, once she has recovered from GI bleeding, could intervene on RCA if she has exertional chest pain that does not respond to medical management.  - No ASA with anticoagulation.  - Will need statin eventually, given liver disease would not give high dose.  Will discuss starting low dose Crestor 5 mg daily with GI.    Length of Stay: [address]19  Brittainy Simmons, PA-C  09/04/2019, 7:45 AM  Patient seen with PA, agree with the above note.   She is stable today, awake/alert.  Has not been getting her cardiac meds because of "low BP" (generally systolic 100s-110s). On exam, she is not volume overloaded. Good co-ox.  - Discussed with nursing about giving her meds.  Will use losartan 25 mg daily, spironolactone 12.5 daily, and Coreg 3.125 mg bid.  She is also on digoxin and po Lasix.   Hgb lower today but not overtly bleeding anymore.  Continue to follow closely .  With CAD, I discussed statin with GI.  Will start her on Crestor at 5 mg daily and follow LFTs closely.    Hopefully ready for rehab placement soon if hgb does not continue to fall.   Marca AnconaDalton Ingram Onnen 09/04/2019 10:05 AM

## 2019-09-04 NOTE — Progress Notes (Signed)
PROGRESS NOTE  Briana Andrews WUJ:[id number] DOB: 1966/09/02 DOA: 08/15/2019 PCP: Patient, [name]  Brief History   53 yo alcoholic woman admitted with R frontal CVA, B popliteal artery occlusions, hepatic failure, GI bleeding. She was found to have new cardiomyopathy with an LV thrombus, cirrhosis, ARF. Underwent B LE thrombectomies, was treated with heparin, stabilized. Has required hemodynamic support w dobutamine. Received PRBC and was able to undergo EGD 9/29 that showed duodenal and gastric ulcers which were treated endoscopically. He has been weaned off of dobutamine. Heparin has been converted to coumadin. She is now on oral protonix.  On 08/29/2019 the patient developed bleeding from multiple sites including her mouth, urine, and rectum. Pharmacy had reduced the dose and goal heparin level earlier in the day. I discussed with pharmacy. The heparin will be held for one hour and then restarted at yet an even lower dose. When it was restarted the patient again had bleeding with continued decline in Hgb. It was again placed on hold on 08/31/2019. It has now been converted to coumadin.  Transthoracic Echocardiogram repeated on 08/31/2019. Persistent appearance of large left ventricular mural thrombus.  The patient underwent left and right heart catheterization on 09/02/2019. Conclusions from this study were:  1. Optimized filling pressures.  2. Mild pulmonary hypertension.  3. Preserved cardiac output.  4. Extensive CAD, primarily distal/branch vessel disease.  The only interventional target would be 80% proximal RCA stenosis. Could consider RCA PCI if she has exertional symptoms despite medical therapy.  Pelvic Ultrasound to investigate complaints of vaginal bleeding while on heparin: It was unable to evaluate the patient's endometrial stripe.  Consultants   Palliative Care  PCCM  Heart Failure Team  Vascular Surgery  Cardiology  Procedures   EGD/Epinephrine  injection  Mechanical Intubation  Central Venous Catheter  Extubation  Arteriogram of right and left lower extremities  Mechanical thrombectomy of the left popliteal and posterior tibial artery using Penumbra CAT 6 device Mechanical thrombectomy of the right popliteal and posterior tibial artery using Penumbra CAT 6 device  Antibiotics   Anti-infectives (From admission, onward)   Start     Dose/Rate Route Frequency Ordered Stop   08/28/19 2200  rifaximin (XIFAXAN) tablet 550 mg     550 mg Oral 2 times daily 08/28/19 0934     08/26/19 1000  rifaximin (XIFAXAN) tablet 550 mg  Status:  Discontinued     550 mg Per Tube 2 times daily 08/25/19 2113 08/28/19 0935   08/20/19 1800  cefTRIAXone (ROCEPHIN) 2 g in sodium chloride 0.9 % 100 mL IVPB  Status:  Discontinued     2 g 200 mL/hr over 30 Minutes Intravenous Every 24 hours 08/20/19 1600 08/23/19 0825   08/18/19 0700  levofloxacin (LEVAQUIN) tablet 750 mg  Status:  Discontinued     750 mg Oral Every 48 hours 08/17/19 1205 08/20/19 1600   08/16/19 1115  rifaximin (XIFAXAN) tablet 550 mg  Status:  Discontinued     550 mg Oral 2 times daily 08/16/19 1114 08/25/19 2113   08/16/19 0700  levofloxacin (LEVAQUIN) IVPB 750 mg  Status:  Discontinued     750 mg 100 mL/hr over 90 Minutes Intravenous Every 48 hours 08/16/19 0616 08/17/19 1205     Subjective  The patient is resting comfortably. No new complaints.   Objective   Vitals:  Vitals:   09/04/19 1013 09/04/19 1354  BP:  99/62  Pulse: 79 78  Resp:  15  Temp:  98.4 F (36.9 C)  SpO2:  98%   Exam:  Constitutional:   The patient is awake, alert, and oriented x 3. No acute distress. Respiratory:   No increased work of breathing.  No wheezes, rales, or rhonchi  No tactile fremitus Cardiovascular:   Regular rate and rhythm  No murmurs, ectopy, or gallups.  No lateral PMI. No thrills. Abdomen:   Abdomen is soft, non-tender, non-distended  No hernias, masses, or  organomegaly  Normoactive bowel sounds.  Musculoskeletal:   No cyanosis, clubbing, or edema Skin:   No rashes, lesions, ulcers  palpation of skin: no induration or nodules Neurologic:   CN 2-12 intact  Sensation all 4 extremities intact Psychiatric:   Mental status o Mood: Depressed, affect flat o Orientation to person, place, time   judgment and insight appear intact  I have personally reviewed the following:   Today's Data   Vitals, CBC, CMP  Scheduled Meds:  carvedilol  3.125 mg Oral BID WC   chlorhexidine  15 mL Mouth/Throat BID   Chlorhexidine Gluconate Cloth  6 each Topical Daily   digoxin  0.125 mg Oral Daily   feeding supplement  1 Container Oral TID BM   folic acid  1 mg Oral Daily   furosemide  40 mg Oral Daily   gabapentin  200 mg Oral BID   losartan  25 mg Oral Daily   mouth rinse  15 mL Mouth Rinse BID   multivitamin with minerals  1 tablet Per Tube Daily   nicotine  21 mg Transdermal Daily   pantoprazole  40 mg Oral BID   rifaximin  550 mg Oral BID   rosuvastatin  5 mg Oral q1800   sodium chloride flush  10 mL Intravenous Q12H   sodium chloride flush  10 mL Intravenous Q12H   sodium chloride flush  3 mL Intravenous Q12H   sodium chloride flush  3 mL Intravenous Q12H   spironolactone  12.5 mg Oral QHS   thiamine  100 mg Oral Daily   warfarin  1 mg Oral ONCE-1800   Warfarin - Pharmacist Dosing Inpatient   Does not apply q1800   Continuous Infusions:  sodium chloride 10 mL/hr at 09/02/19 0318   sodium chloride     magnesium sulfate bolus IVPB      Principal Problem:   Cerebral embolism with cerebral infarction Active Problems:   Abnormal LFTs   Acute metabolic encephalopathy   SOB (shortness of breath)   AKI (acute kidney injury) (HCC)   Fall   Lactic acidosis   Back pain   Liver failure without hepatic coma (HCC)   Multifocal pneumonia   PVD (peripheral vascular disease) (HCC)   Left ventricular apical  thrombus without MI (HCC)   Acute CHF (congestive heart failure) (HCC)   Jaundice   Shock circulatory (HCC)   Palliative care by specialist   Goals of care, counseling/discussion   Advanced care planning/counseling discussion   Acute upper GI bleed   Pressure injury of skin   GI bleed   Acute on chronic combined systolic and diastolic CHF (congestive heart failure) (HCC)   LOS: 19 days   A & P  Biventricular failure some combination ischemic event vs stress-induced (from shock state, acidemia, ABLA, ?aspiration-induced sepsis). Dobutamine weaned to off.  The patient was successfully extubated after EGD yesterday and respiratory status is stable on 4L [place]. The patient underwent LHC and RHC today. Conclusions:  1. Optimized filling pressures.  2. Mild pulmonary hypertension.  3. Preserved cardiac output.  4. Extensive CAD, primarily distal/branch vessel disease.  The only interventional target would be 80% proximal RCA stenosis. Could consider RCA PCI if she has exertional symptoms despite medical therapy.  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 As per cardiology.  Hemorrhage from mouth, urine, and rectum per nursing: Pharmacy had reduced the dose and goal heparin level earlier in the day. I discussed with pharmacy. The heparin will be held for one hour and then restarted at yet an even lower dose.  Heparin has been stopped as the patient is now therapeutic on coumadin. No further obvious bleeding, although hemoglobin is trending down..   Abnormal uterine bleeding: Stopped following cessation of heparin.Complete pelvic ultrasound was performed and was not sufficient to evaluate the patient's endometrial stripe. I have discussed the patient with Dr. Earlene Plater who is on call for gynecology. She recommends that the patient follow up with gynecology as outpatient. No further inpatient evaluation is necessary.  LV clot on AC:  ASA 81 mg daily. The patient is now therapeutic on coumadin.  Monitor CBC. He moglobin has dropped from 10.0 yesterday to 8.3 today.   GI Bleed: The patient presented with melena with suspicion for upper GI source, intermittent transfusion requirements. EGD demonstrated oozing 55mm pre-pyloric ulcer. This was injected with Epi. Hgb 7.5 overnight and received 1 unit PRBC's. Hemoglobin dropped to 7 again on 08/29/2019 after observation of bleeding in mouth, in urine, and in stool. Heparin is held for one hour and dose reduced for the second time. The patient was transfused with 2 units PRBC's. Bleeding appears to have stopped, but hemoglobin has dropped from 10.0 to 8.3 since yesterday. Monitor. She has now been converted to coumadin. INR 2.8 today.  Alcoholism, hyperbilirubinemia, clinically she has cirrhosis although imaging features not c/w this; LFT pattern not c/w alcoholic hepatitis nor ischemic nor congestive. We will continue lactulose, rifaximin, thiamine and folate.  Encephalopathy: Combination from critical illness, hepatic, and CVA, still has asterixis, improving overall. Continue lactulose.  AKI: Improving related to initial shock, blood loss.  CVA: likely secondary to emboli from LV clot.  BL femoral artery occlusion s/p BL thrombectomy:  RLE does not look great but dopplerable pulses, vascular surgery aware, poor candidate for further procedures, monitor clinically.  Severe protein calorie malnutrition due to alcoholism: Nutrition Consult.  Rt sided pleural effusion without resp distress: likely due to hepatic hydrothorax vs. distrubutive from poor oncotic pressure. No indication to drain.  I have seen and examined this patient myself. I have spent 45 minutes in her evaluation and care. More than 50% spent in coordination of care with Dr. Earlene Plater from Gynecology.  Mollye Guinta, DO [facility] Direct contact: see [email or web address]  7PM-7AM contact night coverage as above 09/04/2019, 2:50 PM  LOS: 12 days

## 2019-09-05 DIAGNOSIS — Z7189 Other specified counseling: Secondary | ICD-10-CM

## 2019-09-05 DIAGNOSIS — Z978 Presence of other specified devices: Secondary | ICD-10-CM | POA: Diagnosis not present

## 2019-09-05 LAB — AMMONIA: Ammonia: 15 umol/L (ref 9–35)

## 2019-09-05 LAB — CBC
HCT: 24.5 % — ABNORMAL LOW (ref 36.0–46.0)
HCT: 27.8 % — ABNORMAL LOW (ref 36.0–46.0)
HCT: 25.9 % — ABNORMAL LOW (ref 36.0–46.0)
Hemoglobin: 8.1 g/dL — ABNORMAL LOW (ref 12.0–15.0)
Hemoglobin: 9.2 g/dL — ABNORMAL LOW (ref 12.0–15.0)
Hemoglobin: 8.7 g/dL — ABNORMAL LOW (ref 12.0–15.0)
MCH: 32.5 pg (ref 26.0–34.0)
MCH: 32.2 pg (ref 26.0–34.0)
MCH: 32.5 pg (ref 26.0–34.0)
MCHC: 33.1 g/dL (ref 30.0–36.0)
MCHC: 33.1 g/dL (ref 30.0–36.0)
MCHC: 33.6 g/dL (ref 30.0–36.0)
MCV: 98.4 fL (ref 80.0–100.0)
MCV: 97.2 fL (ref 80.0–100.0)
MCV: 96.6 fL (ref 80.0–100.0)
Platelets: 276 10*3/uL (ref 150–400)
Platelets: 304 10*3/uL (ref 150–400)
Platelets: 272 10*3/uL (ref 150–400)
RBC: 2.49 MIL/uL — ABNORMAL LOW (ref 3.87–5.11)
RBC: 2.86 MIL/uL — ABNORMAL LOW (ref 3.87–5.11)
RBC: 2.68 MIL/uL — ABNORMAL LOW (ref 3.87–5.11)
RDW: 18.8 % — ABNORMAL HIGH (ref 11.5–15.5)
RDW: 18.7 % — ABNORMAL HIGH (ref 11.5–15.5)
RDW: 18.9 % — ABNORMAL HIGH (ref 11.5–15.5)
WBC: 5.1 10*3/uL (ref 4.0–10.5)
WBC: 5.2 10*3/uL (ref 4.0–10.5)
WBC: 6.2 10*3/uL (ref 4.0–10.5)
nRBC: 0 % (ref 0.0–0.2)
nRBC: 0 % (ref 0.0–0.2)
nRBC: 0 % (ref 0.0–0.2)

## 2019-09-05 LAB — COOXEMETRY PANEL
Carboxyhemoglobin: 2 % — ABNORMAL HIGH (ref 0.5–1.5)
Methemoglobin: 0.6 % (ref 0.0–1.5)
O2 Saturation: 81.2 %
Total hemoglobin: 8.4 g/dL — ABNORMAL LOW (ref 12.0–16.0)

## 2019-09-05 LAB — HEPATIC FUNCTION PANEL
ALT: 33 U/L (ref 0–44)
AST: 36 U/L (ref 15–41)
Albumin: 1.2 g/dL — ABNORMAL LOW (ref 3.5–5.0)
Alkaline Phosphatase: 114 U/L (ref 38–126)
Bilirubin, Direct: 1.4 mg/dL — ABNORMAL HIGH (ref 0.0–0.2)
Indirect Bilirubin: 1.8 mg/dL — ABNORMAL HIGH (ref 0.3–0.9)
Total Bilirubin: 3.2 mg/dL — ABNORMAL HIGH (ref 0.3–1.2)
Total Protein: 5.4 g/dL — ABNORMAL LOW (ref 6.5–8.1)

## 2019-09-05 LAB — BASIC METABOLIC PANEL
Anion gap: 7 (ref 5–15)
BUN: 6 mg/dL (ref 6–20)
CO2: 27 mmol/L (ref 22–32)
Calcium: 7.3 mg/dL — ABNORMAL LOW (ref 8.9–10.3)
Chloride: 100 mmol/L (ref 98–111)
Creatinine, Ser: 0.77 mg/dL (ref 0.44–1.00)
GFR calc Af Amer: >60 mL/min (ref >60)
GFR calc non Af Amer: >60 mL/min (ref >60)
Glucose, Bld: 87 mg/dL (ref 70–99)
Potassium: 3.8 mmol/L (ref 3.5–5.1)
Sodium: 134 mmol/L — ABNORMAL LOW (ref 135–145)

## 2019-09-05 LAB — IRON AND TIBC
Iron: 100 ug/dL (ref 28–170)
Saturation Ratios: 93 % — ABNORMAL HIGH (ref 10.4–31.8)
TIBC: 108 ug/dL — ABNORMAL LOW (ref 250–450)
UIBC: 8 ug/dL

## 2019-09-05 LAB — PROTIME-INR
INR: 1.8 — ABNORMAL HIGH (ref 0.8–1.2)
Prothrombin Time: 20.8 seconds — ABNORMAL HIGH (ref 11.4–15.2)

## 2019-09-05 LAB — FERRITIN: Ferritin: 592 ng/mL — ABNORMAL HIGH (ref 11–307)

## 2019-09-05 MED ORDER — WARFARIN SODIUM 2.5 MG PO TABS
2.5000 mg | ORAL_TABLET | Freq: Once | ORAL | Status: AC
  Administered 2019-09-05: 2.5 mg via ORAL
  Filled 2019-09-05: qty 1

## 2019-09-05 MED ORDER — SPIRONOLACTONE 25 MG PO TABS
25.0000 mg | ORAL_TABLET | Freq: Every day | ORAL | Status: DC
  Administered 2019-09-05 – 2019-09-12 (×8): 25 mg via ORAL
  Filled 2019-09-05 (×8): qty 1

## 2019-09-05 NOTE — Progress Notes (Signed)
SLP Cancellation Note  Patient Details Name: Briana Andrews MRN: [id number] DOB: 1966/04/25   Cancelled treatment:       Reason Eval/Treat Not Completed: Fatigue/lethargy limiting ability to participate. Somnolent; not arousable to participate. D/W RN.  Mariella Blackwelder L. Tivis Ringer, [place] CCC/SLP Acute Rehabilitation Services Office number [phone] Pager [phone]    Juan Quam Laurice 09/05/2019, 2:57 PM

## 2019-09-05 NOTE — Progress Notes (Addendum)
Patient ID: Briana Andrews, female   DOB: [birth year], 53 y.o.   MRN: [id number]     Advanced Heart Failure Rounding Note  PCP-Cardiologist: Ena Dawley, MD   Subjective:    More confused than yesterday, has to be re-oriented to place but able to hold conversation and follow commands.    Co-ox 81%, CVP 4.  Hemoglobin with slow down-trend, 8.1 today.  INR 1.8 on warfarin.   S/p EGD (9/29): Bleeding gastric and duodenal ulcers treated in endoscopy suite with resolution of bleeding. No varices.  She got 1 unit PRBCs 10/2. Hemoglobin 8.3. No overt bleeding.   LHC/RHC (10/6):  Coronary Findings  Diagnostic Dominance: Right Left Main  Short, no significant disease.  Left Anterior Descending  40% mid LAD stenosis after D2, diffuse moderate luminal irregularities distal LAD.  Ramus Intermedius  Small caliber vessel, appears to be diffusely severely diseased then subtotally occluded in the mid-vessel.  Left Circumflex  50% distal LCx stenosis. Diffuse moderate disease distal OM branches.  Right Coronary Artery  80% proximal RCA stenosis. 60% mid PDA stenosis.  Intervention  No interventions have been documented. Right Heart  Right Heart Pressures RHC Procedural Findings: Hemodynamics (mmHg) RA mean 6 RV 42/4 PA 48/20, mean 29 PCWP mean 7 LV 101/9 AO 101/56  Oxygen saturations: PA 73% AO 100%  Cardiac Output (Fick) 7.07  Cardiac Index (Fick) 3.7 PVR 3.1 WU      Objective:   Weight Range: 80.6 kg Body mass index is 29.57 kg/m.   Vital Signs:   Temp:  [97.9 F (36.6 C)-98.8 F (37.1 C)] 97.9 F (36.6 C) (10/09 0448) Pulse Rate:  [71-84] 76 (10/09 0640) Resp:  [15-25] 20 (10/09 0640) BP: (95-116)/(51-66) 96/51 (10/09 0537) SpO2:  [89 %-100 %] 94 % (10/09 0640) Weight:  [80.6 kg] 80.6 kg (10/09 0550) Last BM Date: 09/05/19  Weight change: Filed Weights   09/02/19 0500 09/03/19 0500 09/05/19 0550  Weight: 83.9 kg 84.4 kg 80.6 kg     Intake/Output:   Intake/Output Summary (Last 24 hours) at 09/05/2019 0842 Last data filed at 09/05/2019 0616 Gross per 24 hour  Intake 480 ml  Output 4775 ml  Net -4295 ml      Physical Exam    General: NAD Neck: No JVD, no thyromegaly or thyroid nodule.  Lungs: Clear to auscultation bilaterally with normal respiratory effort. CV: Nondisplaced PMI.  Heart regular S1/S2, no S3/S4, no murmur.  1+ right ankle edema.   Abdomen: Soft, nontender, no hepatosplenomegaly, no distention.  Skin: Intact without lesions or rashes.  Neurologic: Alert, not oriented to place.  Psych: Normal affect. Extremities: Stable ischemic changes right foot.  HEENT: Normal.    Telemetry   NSR.  HR 60s while awake but mid-40s while asleep. Personally reviewed.   Labs    CBC Recent Labs    09/04/19 0342  09/04/19 2230 09/05/19 0433  WBC 5.2  --   --  5.1  HGB 8.3*   < > 8.7* 8.1*  HCT 25.0*   < > 25.3* 24.5*  MCV 97.3  --   --  98.4  PLT 247  --   --  276   < > = values in this interval not displayed.   Basic Metabolic Panel Recent Labs    09/04/19 0342 09/05/19 0433  NA 134* 134*  K 3.2* 3.8  CL 100 100  CO2 26 27  GLUCOSE 87 87  BUN 8 6  CREATININE 0.78 0.77  CALCIUM 7.3* 7.3*  Liver Function Tests Recent Labs    09/03/19 0503  AST 42*  ALT 35  ALKPHOS 113  BILITOT 3.3*  PROT 5.3*  ALBUMIN 1.3*   No results for input(s): LIPASE, AMYLASE in the last 72 hours. Cardiac Enzymes No results for input(s): CKTOTAL, CKMB, CKMBINDEX, TROPONINI in the last 72 hours.  BNP: BNP (last 3 results) No results for input(s): BNP in the last 8760 hours.  ProBNP (last 3 results) No results for input(s): PROBNP in the last 8760 hours.   D-Dimer No results for input(s): DDIMER in the last 72 hours. Hemoglobin A1C No results for input(s): HGBA1C in the last 72 hours. Fasting Lipid Panel No results for input(s): CHOL, HDL, LDLCALC, TRIG, CHOLHDL, LDLDIRECT in the last 72  hours. Thyroid Function Tests No results for input(s): TSH, T4TOTAL, T3FREE, THYROIDAB in the last 72 hours.  Invalid input(s): FREET3  Other results:   Imaging    No results found.   Medications:     Scheduled Medications:  carvedilol  3.125 mg Oral BID WC   chlorhexidine  15 mL Mouth/Throat BID   Chlorhexidine Gluconate Cloth  6 each Topical Daily   digoxin  0.125 mg Oral Daily   feeding supplement  1 Container Oral TID BM   folic acid  1 mg Oral Daily   furosemide  40 mg Oral Daily   gabapentin  200 mg Oral BID   losartan  25 mg Oral Daily   mouth rinse  15 mL Mouth Rinse BID   multivitamin with minerals  1 tablet Per Tube Daily   nicotine  21 mg Transdermal Daily   pantoprazole  40 mg Oral BID   rifaximin  550 mg Oral BID   rosuvastatin  5 mg Oral q1800   sodium chloride flush  10 mL Intravenous Q12H   sodium chloride flush  10 mL Intravenous Q12H   sodium chloride flush  3 mL Intravenous Q12H   sodium chloride flush  3 mL Intravenous Q12H   spironolactone  25 mg Oral QHS   thiamine  100 mg Oral Daily   Warfarin - Pharmacist Dosing Inpatient   Does not apply q1800    Infusions:  sodium chloride 10 mL/hr at 09/02/19 0318   sodium chloride     magnesium sulfate bolus IVPB      PRN Medications: sodium chloride, sodium chloride, acetaminophen, albuterol, dextromethorphan-guaiFENesin, hydrALAZINE, ondansetron (ZOFRAN) IV, oxyCODONE, sodium chloride flush, sodium chloride flush, white petrolatum   Assessment/Plan   1. Acute on chronic systolic CHF/cardiogenic shock: Echo with EF 15-20%, mild LV dilation, LV thrombus, mild-moderate MR, severely decreased RV systolic function.  LHC/RHC done 10/6 showing normal filling pressures and preserved cardiac output.  There was diffuse coronary disease, especially involving distal and branch vessels, only possible interventional target was 80% proximal RCA stenosis.  No intervention at this time given  good flow down the vessel and unlikely to significantly improve LV function.  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  Initial cardiogenic shock, off dobutamine now.  Currently appears euvolemic. Co-ox 81% today. CVP 4.  - Continue Lasix 40 mg po daily.  - Continue w/ Unna boots. - Increase spironolactone to 25 mg daily.   - Continue digoxin 0.125.  - Continue Coreg 3.125 mg bid.  - Continue losartan 25 mg daily.  With SBP in 90s-100s, will not transition to [facility], but please do not hold meds unless SBP < 85.  -  Not candidate for advanced therapies with ETOH abuse/liver failure.  2. Liver failure: ETOH hepatitis. Markedly elevated bilirubin initially with jaundice.  However liver imaging with [place] and CT has NOT shown cirrhosis or portal/hepatic vein thrombosis. Question has arisen whether this might possibly be due primarily to RV failure.  Time course seems quite acute for RV failure.  Jaundice/icterus improving and Tbili down to 3.3 when last checked. 3. Encephalopathy: NH3 has not been markedly high but certainly could be hepatic encephalopathy.  Shock likely played a role as well.  More confused this morning.  - She continues on rifaximin, lactulose stopped.  - Will recheck NH3 and LFTs.  4. AKI: Creatinine improved with inotropic support.  Suspect cardiorenal syndrome, also had contrast with LE angiograms so possible component of contrast nephropathy. Creatinine normalized. 5. LV thrombus: Possible cause of embolic event to popliteal arteries as well as subacute CVA.  - Continue warfarin. INR 1.8 but with GI and vaginal bleeding, will not cover with heparin gtt.  6. PAD: Bilateral popliteal artery occlusions, suspect cardioembolic from LV thrombus.  Now s/p mechanical thrombectomy by vascular. Right foot still looks ischemic but dopplerable pulses.  - Continue anticoagulation.  7. 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  Likely from LV thrombus.   - Continue anticoagulation.  8. Right popliteal vein DVT - Continue anticoagulation.  9. GI bleeding: Upper GI bleeding, EGD with bleeding duodenal and gastric ulcer on 9/29 that were treated, no varices. She had 1 unit PRBCs on 9/30 and 1 unit on 10/2. Slow down-trend in hgb, INR 8.1.   - Would not transfuse unless active bleeding with hgb < 8.  - Continue Protonix.  - Fe studies => likely would benefit from IV Fe.  10. ETOH abuse: Per husband, at least a 6 pack daily and usually more prior to admission. Plans to quit.  11. ID: Concern for aspiration PNA. - Completed course of ceftriaxone.  12. Vaginal bleeding: Resolved. Was seen by OB/gyn, plan for outpatient followup.   13. Deconditioning:  work with PT.  Wants to go home rather than to CIR.  14. NSVT: Continue Coreg.   15. CAD: Extensive distal and branch vessel coronary disease on cath 10/6.  Only possible interventional target was 80% proximal RCA stenosis.  However, there is good flow down the RCA and I do not think intervention on this lesion would appreciably improve her LV function.  She has not had chest pain.  Would avoid adding antiplatelet therapy with recent GI bleeding.  - In future, once she has recovered from GI bleeding, could intervene on RCA if she has exertional chest pain that does not respond to medical management.  - No ASA with anticoagulation.  - Discussed statin with GI, started on Crestor 5 mg daily.   We will see again after the weekend unless called.   Length of Stay: 20  Marca Ancona, MD  09/05/2019, 8:42 AM

## 2019-09-05 NOTE — Progress Notes (Signed)
Physical Therapy Treatment Patient Details Name: Briana Andrews MRN: [id number] DOB: 10/31/1966 Today's Date: 09/05/2019    History of Present Illness 53 year old female with history of smoking, alcohol use, she drinks more than 6 packs of beer every day since last 20 years, no chronic medical issues brought to the emergency room with confusion more than usual and falls.  Since last few months there have been noticing some changes on her including weakness on her legs and poor appetite as much as she stopped smoking and decreased her drinking.  2 weeks ago, she was taken to [facility] with a fall, skeletal survey was negative except L1 fracture with 25% height loss and discharged home on muscle relaxants.  Patient has been intermittently confused since then. In the emergency room, she was found with subacute right ACA stroke, jaundice with bilirubin of 12, bilateral multifocal pneumonia and metabolic encephalopathy.s/p cardiac cath 10/6. Echo 10/4- LV thrombus.    PT Comments    Pt making slow progress. Limited by rt foot pain.    Follow Up Recommendations  SNF;Supervision/Assistance - 24 hour(CIR denied)     Equipment Recommendations  Other (comment)(To be determined)    Recommendations for Other Services       Precautions / Restrictions Precautions Precautions: Fall Precaution Comments: flexiseal, foley, wound on Rt foot Restrictions Weight Bearing Restrictions: No    Mobility  Bed Mobility Overal bed mobility: Needs Assistance Bed Mobility: Supine to Sit     Supine to sit: Mod assist;HOB elevated     General bed mobility comments: Assist to move legs off bed, elevate trunk into sitting, and bring hips to EOB  Transfers Overall transfer level: Needs assistance Equipment used: Rolling walker (2 wheeled) Transfers: Sit to/from Stand Sit to Stand: Mod assist;+2 physical assistance         General transfer comment: Assist to bring hips up and for  balance. Pt with initial posterior lean  Ambulation/Gait Ambulation/Gait assistance: Min assist;+2 safety/equipment Gait Distance (Feet): 5 Feet Assistive device: Rolling walker (2 wheeled) Gait Pattern/deviations: Step-to pattern;Decreased step length - right;Decreased step length - left;Decreased stance time - right;Decreased weight shift to right;Trunk flexed;Antalgic Gait velocity: decr Gait velocity interpretation: <1.31 ft/sec, indicative of household ambulator General Gait Details: Verbal cues to stay closer to walker for more support. Distance limited by rt foot pain   Stairs             Wheelchair Mobility    Modified Rankin (Stroke Patients Only) Modified Rankin (Stroke Patients Only) Pre-Morbid Rankin Score: No symptoms Modified Rankin: Moderately severe disability     Balance Overall balance assessment: Needs assistance Sitting-balance support: Feet supported;No upper extremity supported Sitting balance-Leahy Scale: Fair     Standing balance support: During functional activity Standing balance-Leahy Scale: Poor Standing balance comment: walker and min assist for static standing                            Cognition Arousal/Alertness: Awake/alert(Initially sleepy but aroused) Behavior During Therapy: Flat affect Overall Cognitive Status: No family/caregiver present to determine baseline cognitive functioning Area of Impairment: Following commands;Problem solving;Awareness;Memory                     Memory: Decreased short-term memory Following Commands: Follows one step commands with increased time;Follows multi-step commands inconsistently   Awareness: Intellectual Problem Solving: Slow processing;Decreased initiation;Difficulty sequencing;Requires verbal cues;Requires tactile cues        Exercises  General Comments General comments (skin integrity, edema, etc.): VSS      Pertinent Vitals/Pain Pain Assessment: 0-10 Pain  Score: 8  Faces Pain Scale: Hurts whole lot Pain Location: RLE Pain Descriptors / Indicators: Grimacing;Throbbing Pain Intervention(s): Limited activity within patient's tolerance;Monitored during session;Repositioned    Home Living                      Prior Function            PT Goals (current goals can now be found in the care plan section) Acute Rehab PT Goals Patient Stated Goal: none stated Progress towards PT goals: Progressing toward goals    Frequency    Min 3X/week      PT Plan Discharge plan needs to be updated    Co-evaluation              AM-PAC PT "6 Clicks" Mobility   Outcome Measure  Help needed turning from your back to your side while in a flat bed without using bedrails?: A Little Help needed moving from lying on your back to sitting on the side of a flat bed without using bedrails?: A Lot Help needed moving to and from a bed to a chair (including a wheelchair)?: A Lot Help needed standing up from a chair using your arms (e.g., wheelchair or bedside chair)?: A Lot Help needed to walk in hospital room?: A Little Help needed climbing 3-5 steps with a railing? : Total 6 Click Score: 13    End of Session Equipment Utilized During Treatment: Gait belt Activity Tolerance: Patient limited by pain Patient left: in chair;with call bell/phone within reach;with chair alarm set Nurse Communication: Mobility status PT Visit Diagnosis: Other abnormalities of gait and mobility (R26.89);Pain;Muscle weakness (generalized) (M62.81);Difficulty in walking, not elsewhere classified (R26.2) Pain - Right/Left: Right Pain - part of body: Ankle and joints of foot     Time: 1779-3903 PT Time Calculation (min) (ACUTE ONLY): 19 min  Charges:  $Gait Training: 8-22 mins                     Logan Pager [phone] Office [place] 09/05/2019, 2:17 PM

## 2019-09-05 NOTE — Progress Notes (Signed)
Orthopedic Tech Progress Note Patient Details:  Briana Andrews 1966-09-25 [id number]  Ortho Devices Type of Ortho Device: Postop shoe/boot Ortho Device/Splint Location: right Ortho Device/Splint Interventions: Application   Post Interventions Patient Tolerated: Well Instructions Provided: Care of device   [name] 09/05/2019, 5:49 PM

## 2019-09-05 NOTE — NC FL2 (Signed)
[place] MEDICAID FL2 LEVEL OF CARE SCREENING TOOL     IDENTIFICATION  Patient Name: Briana Andrews Birthdate: [birth year] Sex: female Admission Date (Current Location): 08/15/2019  [facility] and [place] Number:  Herbalist and Address:  [facility] [place], [place] [address], [place], [place] [address]      Provider Number: [id number]  Attending Physician Name and Address:  Ivor Costa, MD  Relative Name and Phone Number:  Skarlet Lyons [phone]    Current Level of Care: Hospital Recommended Level of Care: [place] Prior Approval Number:    Date Approved/Denied:   PASRR Number: [id number]  Discharge Plan: SNF    Current Diagnoses: Patient Active Problem List   Diagnosis Date Noted  . Acute on chronic combined systolic and diastolic CHF (congestive heart failure) ([place])   . GI bleed 08/26/2019  . Pressure injury of skin 08/23/2019  . Acute upper GI bleed   . Shock circulatory ([place])   . Palliative care by specialist   . Goals of care, counseling/discussion   . Advanced care planning/counseling discussion   . Jaundice   . Cerebral embolism with cerebral infarction 08/17/2019  . PVD (peripheral vascular disease) ([place]) 08/17/2019  . Left ventricular apical thrombus without MI ([place]) 08/17/2019  . Acute CHF (congestive heart failure) ([place]) 08/17/2019  . Abnormal LFTs 08/16/2019  . Acute metabolic encephalopathy 84/69/6295  . SOB (shortness of breath) 08/16/2019  . AKI (acute kidney injury) ([place]) 08/16/2019  . Fall 08/16/2019  . Lactic acidosis 08/16/2019  . Back pain 08/16/2019  . Liver failure without hepatic coma ([place]) 08/16/2019  . Multifocal pneumonia 08/16/2019  . Alcohol abuse   . Tobacco abuse   . Shortness of breath     Orientation RESPIRATION BLADDER Height & Weight     Self, Time, Situation, Place  Normal Incontinent(urinary catheter) Weight: 177 lb 11.1 oz (80.6 kg) Height:  5\' 5"  (165.1 cm)  BEHAVIORAL  SYMPTOMS/MOOD NEUROLOGICAL BOWEL NUTRITION STATUS      Incontinent Diet(please see discharge sumamry)  AMBULATORY STATUS COMMUNICATION OF NEEDS Skin     Verbally Surgical wounds(pressure injury heel,left stage I, pressure injury heel right, stage I , wound/incision foot anterior,right, wound/incision pretibial distal,right,  wound incision non pressure wound)                       Personal Care Assistance Level of Assistance  Bathing, Feeding, Dressing Bathing Assistance: Limited assistance Feeding assistance: Independent Dressing Assistance: Limited assistance     Functional Limitations Info  Sight, Hearing, Speech Sight Info: Adequate Hearing Info: Adequate Speech Info: Adequate    SPECIAL CARE FACTORS FREQUENCY  PT (By licensed PT), OT (By licensed OT)     PT Frequency: 3x per weeek OT Frequency: 3x per week            Contractures Contractures Info: Not present    Additional Factors Info  Code Status, Allergies Code Status Info: DNR Allergies Info: Penicillins           Current Medications (09/05/2019):  This is the current hospital active medication list Current Facility-Administered Medications  Medication Dose Route Frequency Provider Last Rate Last Dose  . 0.9 %  sodium chloride infusion  250 mL Intravenous PRN Erick Colace, NP 10 mL/hr at 09/02/19 0318    . 0.9 %  sodium chloride infusion  250 mL Intravenous PRN Larey Dresser, MD      . acetaminophen (TYLENOL) tablet 650 mg  650 mg Oral Q4H PRN Laurey Morale, MD   650 mg at 09/05/19 1018  . albuterol (PROVENTIL) (2.5 MG/3ML) 0.083% nebulizer solution 2.5 mg  2.5 mg Nebulization Q4H PRN Simonne Martinet, NP      . carvedilol (COREG) tablet 3.125 mg  3.125 mg Oral BID WC Laurey Morale, MD   3.125 mg at 09/05/19 1001  . chlorhexidine (PERIDEX) 0.12 % solution 15 mL  15 mL Mouth/Throat BID Simonne Martinet, NP   15 mL at 09/05/19 1002  . Chlorhexidine Gluconate Cloth 2 % PADS 6 each  6 each  Topical Daily Simonne Martinet, NP   6 each at 09/05/19 1000  . dextromethorphan-guaiFENesin (MUCINEX DM) 30-600 MG per 12 hr tablet 1 tablet  1 tablet Oral BID PRN Simonne Martinet, NP      . digoxin (LANOXIN) tablet 0.125 mg  0.125 mg Oral Daily Swayze, Ava, DO   0.125 mg at 09/05/19 1000  . feeding supplement (BOOST / RESOURCE BREEZE) liquid 1 Container  1 Container Oral TID BM Lorin Glass, MD   1 Container at 09/05/19 1000  . folic acid (FOLVITE) tablet 1 mg  1 mg Oral Daily Swayze, Ava, DO   1 mg at 09/05/19 1708  . furosemide (LASIX) tablet 40 mg  40 mg Oral Daily Laurey Morale, MD   40 mg at 09/05/19 1001  . gabapentin (NEURONTIN) capsule 200 mg  200 mg Oral BID Swayze, Ava, DO   200 mg at 09/05/19 1000  . hydrALAZINE (APRESOLINE) injection 5 mg  5 mg Intravenous Q20 Min PRN Simonne Martinet, NP      . losartan (COZAAR) tablet 25 mg  25 mg Oral Daily Lorretta Harp, MD   25 mg at 09/05/19 1022  . magnesium sulfate IVPB 2 g 50 mL  2 g Intravenous Once Swayze, Ava, DO      . MEDLINE mouth rinse  15 mL Mouth Rinse BID Lorin Glass, MD   15 mL at 09/05/19 1011  . multivitamin with minerals tablet 1 tablet  1 tablet Per Tube Daily Charlott Rakes, MD   1 tablet at 09/05/19 1000  . nicotine (NICODERM CQ - dosed in mg/24 hours) patch 21 mg  21 mg Transdermal Daily Simonne Martinet, NP   21 mg at 09/04/19 1021  . ondansetron (ZOFRAN) injection 4 mg  4 mg Intravenous Q6H PRN Laurey Morale, MD      . oxyCODONE (Oxy IR/ROXICODONE) immediate release tablet 5 mg  5 mg Oral Q6H PRN Swayze, Ava, DO      . pantoprazole (PROTONIX) EC tablet 40 mg  40 mg Oral BID Laurey Morale, MD   40 mg at 09/05/19 1002  . rifaximin (XIFAXAN) tablet 550 mg  550 mg Oral BID Swayze, Ava, DO   550 mg at 09/05/19 1000  . rosuvastatin (CRESTOR) tablet 5 mg  5 mg Oral q1800 Laurey Morale, MD   5 mg at 09/04/19 1859  . sodium chloride flush (NS) 0.9 % injection 10 mL  10 mL Intravenous Q12H Simonne Martinet, NP    10 mL at 09/05/19 1017  . sodium chloride flush (NS) 0.9 % injection 10 mL  10 mL Intravenous Q12H Simonne Martinet, NP   10 mL at 09/05/19 1017  . sodium chloride flush (NS) 0.9 % injection 10-40 mL  10-40 mL Intracatheter PRN Simonne Martinet, NP   10 mL at 09/05/19 1018  . sodium  chloride flush (NS) 0.9 % injection 3 mL  3 mL Intravenous Q12H Laurey Morale, MD   3 mL at 09/01/19 2215  . sodium chloride flush (NS) 0.9 % injection 3 mL  3 mL Intravenous Q12H Laurey Morale, MD      . sodium chloride flush (NS) 0.9 % injection 3 mL  3 mL Intravenous PRN Laurey Morale, MD   3 mL at 09/02/19 1444  . spironolactone (ALDACTONE) tablet 25 mg  25 mg Oral QHS Laurey Morale, MD      . thiamine (VITAMIN B-1) tablet 100 mg  100 mg Oral Daily Swayze, Ava, DO   100 mg at 09/05/19 1000  . warfarin (COUMADIN) tablet 2.5 mg  2.5 mg Oral ONCE-1800 Carney, Gwenlyn Found, RPH      . Warfarin - Pharmacist Dosing Inpatient   Does not apply q1800 Laurey Morale, MD      . white petrolatum (VASELINE) gel   Topical PRN Swayze, Ava, DO         Discharge Medications: Please see discharge summary for a list of discharge medications.  Relevant Imaging Results:  Relevant Lab Results:   Additional Information SSN [id number] [place], [place]

## 2019-09-05 NOTE — Progress Notes (Addendum)
PROGRESS NOTE    Amado CoeMichele Arriaga  ZOX:[id number] DOB: 08/26/1966 DOA: 08/15/2019 PCP: Patient, [name]    Brief Narrative:   53 yo alcoholic woman admitted with R frontal CVA, B popliteal artery occlusions, hepatic failure, GI bleeding. She was found to have new cardiomyopathy with an LV thrombus, cirrhosis, ARF. Underwent B LE thrombectomies, was treated with heparin, stabilized. Has required hemodynamic support w dobutamine. Received PRBC and was able to undergo EGD 9/29 that showed duodenal and gastric ulcers which were treated endoscopically. He has been weaned off of dobutamine. Heparin has been converted to coumadin. She is now on oral protonix.  On 08/29/2019 the patient developed bleeding from multiple sites including her mouth, urine, and rectum. Pharmacy had reduced the dose and goal heparin level earlier in the day. I discussed with pharmacy. The heparin will be held for one hour and then restarted at yet an even lower dose. When it was restarted the patient again had bleeding with continued decline in Hgb. It was again placed on hold on 08/31/2019. It has now been converted to coumadin. Transthoracic Echocardiogram repeated on 08/31/2019. Persistent appearance of large left ventricular mural thrombus. Pelvic Ultrasound to investigate complaints of vaginal bleeding while on heparin: It was unable to evaluate the patient's endometrial stripe.  Interim History:  The patient underwent left and right heart catheterization on 09/02/2019. Conclusions from this study were:  1. Optimized filling pressures.  2. Mild pulmonary hypertension.  3. Preserved cardiac output.  4. Extensive CAD, primarily distal/branch vessel disease. The only interventional target would be 80% proximal RCA stenosis. Could consider RCA PCI if she has exertional symptoms despite medical therapy.   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 Dobutamine weaned to off.  The patient was successfully extubated after EGD and respiratory status is stable on 4L [place]. The patient underwent LHC and RHC today. Conclusions: 1. Optimized filling pressures.  2. Mild pulmonary hypertension.  3. Preserved cardiac output.  4. Extensive CAD, primarily distal/branch vessel disease. The only interventional target would be 80% proximal RCA stenosis. Could consider RCA PCI if she has exertional symptoms despite medical therapy.  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 As per cardiology.  PAD: Bilateral popliteal artery occlusions, suspect cardioembolic from LV thrombus. Now s/p mechanical thrombectomy by vascular. Right foot still looks ischemic but dopplerable pulses.  - Continue anticoagulation, coumadin  Acute on chronic systolic CHF/cardiogenic shock: Card was consulted. echo with EF 15-20%, mild LV dilation, LV thrombus, mild-moderate MR, severely decreased RV systolic function.  LHC/RHC done 10/6 showing normal filling pressures and preserved cardiac output.  There was diffuse coronary disease, especially involving distal and branch vessels, only possible interventional target was 80% proximal  RCA stenosis.  No  intervention at this time given good flow down the vessel and unlikely to significantly improve LV function.  Dr. Shirlee Latch suspect that the cardiomyopathy is primarily nonischemic due to ETOH, though coronary disease may play a role.  Initial cardiogenic shock, off dobutamine now.   - Continue Lasix 40 mg po daily.  - Continue w/ Unna boots. - Increase spironolactone to 25 mg daily.   - Continue digoxin 0.125.  - Continue Coreg 3.125 mg bid.  - Continue losartan 25 mg daily.  With SBP in 90s-100s, will not transition to [facility], but please do not hold meds unless SBP < 85.  - Not candidate for advanced therapies with ETOH abuse/liver failure.   Hemorrhage from mouth, urine, and rectum per nursing: resolved expect reporting dark stool. Initially pharmacy had reduced the dose and goal heparin level earlier in the day. Discussed with pharmacy. The heparin will be held for one hour and then restarted at yet an even lower dose.  Heparin has been stopped as the patient is now on coumadin. INR 1.8. No further obvious bleeding, although hemoglobin is trending down silgtly. Hgb 9.5 0n 09/03/19 -->8.1 -->9.2 -->8.7 -f/u q6h CBC -coumadin per pharm   Abnormal uterine bleeding: Stopped following cessation of heparin. Complete pelvic ultrasound was performed and was not sufficient to evaluate the patient's endometrial stripe. Dr. Marthann Schiller  discussed the patient with Dr. Earlene Plater who is on call for gynecology. She recommends that the patient follow up with gynecology as outpatient. No further inpatient evaluation is necessary.   LV clot on AC:  ASA 81 mg daily. The patient is now therapeutic on coumadin. -Monitor CBC.   GI Bleed: The patient presented with melena with suspicion for upper GI source, intermittent transfusion requirements. EGD demonstrated oozing 15mm pre-pyloric ulcer. This was injected with Epi. Hgb 7.5 overnight and received 1 unit PRBC's. Hemoglobin dropped to 7 again on 08/29/2019 after  observation of bleeding in mouth, in urine, and in stool. Heparin is held for one hour and dose reduced for the second time. The patient was transfused with 2 units PRBC's. Bleeding appears to have stopped, but still report dark stool.  - Hgb 9.5 on 09/03/19 -->8.1 -->9.2 -->8.7  Alcoholism, hyperbilirubinemia:  clinically she has cirrhosis although imaging features not c/w this; LFT pattern not c/w alcoholic hepatitis nor ischemic nor congestive.  -WIl continue lactulose, rifaximin, thiamine and folate.  Encephalopathy: resolved. Combination from critical illness, hepatic, and CVA, still has asterixis, improving overall. Continue lactulose.  AKI: Improving related to initial shock, blood loss.  CVA: likely secondary to emboli from LV clot. BL femoral artery occlusion s/p BL thrombectomy:  RLE does not look great but dopplerable pulses, vascular surgery aware, poor candidate for further procedures, monitor clinically. -on Coumadin  Severe protein calorie malnutrition due to alcoholism:  -Nutrition Consult.  Rt sided pleural effusion without resp distress: likely due to hepatic hydrothorax vs. distrubutive from poor oncotic pressure.  -No indication to drain.   DVT prophylaxis: on Coumadin Code Status: DNR Family Communication: family is not at bedside Disposition Plan and Barriers for discharge:  Patient still needed to be diuresed.  Hemoglobin is not fully stabilized. SNF bed is not available yet,  Waiting for SNF bed.  Consultants:   Palliative Care  PCCM  Heart Failure Team  Vascular Surgery  Cardiology  Procedures:  EGD/Epinephrine injection  Mechanical Intubation  Central Venous Catheter  Extubation  Arteriogram of right and left lower extremities  Mechanical thrombectomy of  the left popliteal and posterior tibial artery using Penumbra CAT 6 device Mechanical thrombectomy of the right popliteal and posterior tibial artery using Penumbra CAT 6 device   Antimicrobials:  Subjective:  Patient is still has some leg pain, has intermittent dark stool, no nausea vomiting, abdominal pain or diarrhea.  No vaginal bleeding.  No fever or chills.  No chest pain, shortness breath or cough.  Objective: Vitals:   09/05/19 1453 09/05/19 1600 09/05/19 1852 09/05/19 2000  BP: (!) 92/57 (!) 99/53 115/63 (!) 102/51  Pulse:  60 77 75  Resp: 18 18  18   Temp:  98.1 F (36.7 C)  98.7 F (37.1 C)  TempSrc:  Oral  Oral  SpO2:  94%  93%  Weight:      Height:        Intake/Output Summary (Last 24 hours) at 09/05/2019 2134 Last data filed at 09/05/2019 1903 Gross per 24 hour  Intake 240 ml  Output 3500 ml  Net -3260 ml   Filed Weights   09/02/19 0500 09/03/19 0500 09/05/19 0550  Weight: 83.9 kg 84.4 kg 80.6 kg    Examination: Physical Exam:  General: Not in acute distress. Pale looking HEENT: PERRL, EOMI, no scleral icterus, No JVD or bruit Cardiac: S1/S2, RRR, No murmurs, gallops or rubs Pulm: Clear to auscultation bilaterally. No rales, wheezing, rhonchi or rubs. Abd: Soft, nondistended, nontender, no rebound pain, no organomegaly, BS present Ext: has trace leg edema.  Right foot and toes still looks ischemic. Musculoskeletal: No joint deformities, erythema, or stiffness, ROM full Skin: No rashes.  Neuro: Alert and oriented X3, cranial nerves II-XII grossly intact, muscle strength 5/5 in all extremeties. Psych: Patient is not psychotic, no suicidal or hemocidal ideation.    Data Reviewed: I have personally reviewed following labs and imaging studies  CBC: Recent Labs  Lab 08/30/19 0956  09/01/19 0439  09/02/19 0526  09/03/19 0503  09/04/19 0342 09/04/19 1904 09/04/19 2230 09/05/19 0433 09/05/19 1010 09/05/19 2048  WBC 8.7   < > 6.8  --  6.9  --  7.5  --  5.2  --   --  5.1 5.2 6.2  NEUTROABS 6.7  --  4.7  --  4.7  --   --   --   --   --   --   --   --   --   HGB 11.0*   < > 8.5*   < > 9.8*   < > 10.1*   < > 8.3* 8.9* 8.7* 8.1*  9.2* 8.7*  HCT 32.1*   < > 24.0*   < > 29.0*   < > 30.8*   < > 25.0* 26.7* 25.3* 24.5* 27.8* 25.9*  MCV 93.0   < > 93.4  --  94.8  --  97.5  --  97.3  --   --  98.4 97.2 96.6  PLT 216   < > 209  --  231  --  259  --  247  --   --  276 304 272   < > = values in this interval not displayed.   Basic Metabolic Panel: Recent Labs  Lab 09/01/19 0439 09/01/19 1038 09/02/19 0526 09/02/19 1008 09/02/19 1012 09/03/19 0503 09/04/19 0342 09/05/19 0433  NA 135  --  135 138 140 137 134* 134*  K 3.4*  --  3.8 3.8 3.7 4.0 3.2* 3.8  CL 106  --  103  --   --  105 100 100  CO2 22  --  23  --   --  22 26 27   GLUCOSE 73  --  76  --   --  69* 87 87  BUN 18  --  12  --   --  9 8 6   CREATININE 0.79  --  0.82  --   --  0.91 0.78 0.77  CALCIUM 7.1*  --  7.5*  --   --  7.7* 7.3* 7.3*  MG  --  1.4* 1.9  --   --   --   --   --    GFR: Estimated Creatinine Clearance: 84.3 mL/min (by C-G formula based on SCr of 0.77 mg/dL). Liver Function Tests: Recent Labs  Lab 08/31/19 0318 09/01/19 0439 09/02/19 0526 09/03/19 0503 09/05/19 1010  AST 53* 37 41 42* 36  ALT 36 29 33 35 33  ALKPHOS 137* 103 111 113 114  BILITOT 3.5* 2.9* 3.4* 3.3* 3.2*  PROT 4.8* 4.6* 4.9* 5.3* 5.4*  ALBUMIN 1.0* 1.2* 1.3* 1.3* 1.2*   No results for input(s): LIPASE, AMYLASE in the last 168 hours. Recent Labs  Lab 09/05/19 1010  AMMONIA 15   Coagulation Profile: Recent Labs  Lab 09/01/19 1038 09/03/19 0503 09/04/19 0342 09/05/19 0433  INR 1.9* 2.3* 2.8* 1.8*   Cardiac Enzymes: No results for input(s): CKTOTAL, CKMB, CKMBINDEX, TROPONINI in the last 168 hours. BNP (last 3 results) No results for input(s): PROBNP in the last 8760 hours. HbA1C: No results for input(s): HGBA1C in the last 72 hours. CBG: Recent Labs  Lab 08/31/19 0722 08/31/19 0748 08/31/19 0837 08/31/19 2124 08/31/19 2253  GLUCAP 53* 48* 70 67* 95   Lipid Profile: No results for input(s): CHOL, HDL, LDLCALC, TRIG, CHOLHDL, LDLDIRECT in the  last 72 hours. Thyroid Function Tests: No results for input(s): TSH, T4TOTAL, FREET4, T3FREE, THYROIDAB in the last 72 hours. Anemia Panel: Recent Labs    09/05/19 1010  FERRITIN 592*  TIBC 108*  IRON 100   Sepsis Labs: No results for input(s): PROCALCITON, LATICACIDVEN in the last 168 hours.  No results found for this or any previous visit (from the past 240 hour(s)).   RN Pressure Injury Documentation: Pressure Injury 08/20/19 Heel Left Stage I -  Intact skin with non-blanchable redness of a localized area usually over a bony prominence. heel boggy feeling alightly reddened 3x3 (Active)  08/20/19 1500  Location: Heel  Location Orientation: Left  Staging: Stage I -  Intact skin with non-blanchable redness of a localized area usually over a bony prominence.  Wound Description (Comments): heel boggy feeling alightly reddened 3x3  Present on Admission: Yes     Pressure Injury Heel Right Stage I -  Intact skin with non-blanchable redness of a localized area usually over a bony prominence. heel boggy feeling and reddened measuring 3x3 (Active)     Location: Heel  Location Orientation: Right  Staging: Stage I -  Intact skin with non-blanchable redness of a localized area usually over a bony prominence.  Wound Description (Comments): heel boggy feeling and reddened measuring 3x3  Present on Admission: Yes    Radiology Studies: No results found.      Scheduled Meds: . carvedilol  3.125 mg Oral BID WC  . chlorhexidine  15 mL Mouth/Throat BID  . Chlorhexidine Gluconate Cloth  6 each Topical Daily  . digoxin  0.125 mg Oral Daily  . feeding supplement  1 Container Oral TID BM  . folic acid  1 mg Oral Daily  .  furosemide  40 mg Oral Daily  . gabapentin  200 mg Oral BID  . losartan  25 mg Oral Daily  . mouth rinse  15 mL Mouth Rinse BID  . multivitamin with minerals  1 tablet Per Tube Daily  . nicotine  21 mg Transdermal Daily  . pantoprazole  40 mg Oral BID  . rifaximin   550 mg Oral BID  . rosuvastatin  5 mg Oral q1800  . sodium chloride flush  10 mL Intravenous Q12H  . sodium chloride flush  10 mL Intravenous Q12H  . sodium chloride flush  3 mL Intravenous Q12H  . sodium chloride flush  3 mL Intravenous Q12H  . spironolactone  25 mg Oral QHS  . thiamine  100 mg Oral Daily  . Warfarin - Pharmacist Dosing Inpatient   Does not apply q1800   Continuous Infusions: . sodium chloride 10 mL/hr at 09/02/19 0318  . sodium chloride    . magnesium sulfate bolus IVPB       LOS: 20 days    Time spent: 75    Lorretta Harp, DO [facility] PAGER is on AMION  If 7PM-7AM, please contact night-coverage [email or web address] Password [facility] 09/05/2019, 9:34 PM

## 2019-09-05 NOTE — Progress Notes (Signed)
ANTICOAGULATION CONSULT NOTE - Follow Up Consult  Pharmacy Consult for Heparin> warfarin Indication: DVT, LV thrombus, and acute CVA  Allergies  Allergen Reactions  . Penicillins Other (See Comments)    Unsure of reaction Did it involve swelling of the face/tongue/throat, SOB, or low BP? Unknown Did it involve sudden or severe rash/hives, skin peeling, or any reaction on the inside of your mouth or nose? Unknown Did you need to seek medical attention at a hospital or doctor's office? Unknown When did it last happen?[place] If all above answers are "NO", may proceed with cephalosporin use.    Patient Measurements: Height: 5\' 5"  (165.1 cm) Weight: 177 lb 11.1 oz (80.6 kg) IBW/kg (Calculated) : 57 Heparin Dosing Weight: 75 kg  Vital Signs: Temp: 97.6 F (36.4 C) (10/09 0800) Temp Source: Oral (10/09 0800) BP: 93/52 (10/09 1000) Pulse Rate: 73 (10/09 1000)  Labs: Recent Labs    09/03/19 0503  09/04/19 0342  09/04/19 2230 09/05/19 0433 09/05/19 1010  HGB 10.1*   < > 8.3*   < > 8.7* 8.1* 9.2*  HCT 30.8*   < > 25.0*   < > 25.3* 24.5* 27.8*  PLT 259  --  247  --   --  276 304  LABPROT 24.8*  --  28.8*  --   --  20.8*  --   INR 2.3*  --  2.8*  --   --  1.8*  --   HEPARINUNFRC 0.24*  --   --   --   --   --   --   CREATININE 0.91  --  0.78  --   --  0.77  --    < > = values in this interval not displayed.    Estimated Creatinine Clearance: 84.3 mL/min (by C-G formula based on SCr of 0.77 mg/dL).   Assessment: [name] on heparin for LV thrombus and bilateral lower extremity ischemia w/ right popliteal vein DVT now s/p angioplasty and R/L popliteal thrombectomy of the tibial artery. Heparin therapy complicated by acute CVA and recent GI bleed and need for lower goal. Plans noted for Coumadin when no further procedures   She is s/p mech thrombectomy of L popliteal 9/23. Heparin stopped 09/23 @ 1750 due to a GI bleed. Pt completed course of octreotide. Pt placed on IV  PPI for treatment of ulcers. Heparin to continue due to LV thrombus per Dr. Aundra Dubin.  Heparin stopped 10/7.  INR baseline 1.9>2.3>2.8 > 1.8 with warfarin 2 mg then 1 mg.  States she will refrain from ETOH after DC   S/p cath NICM, CAD no intervention  Goal of Therapy:   INR 2-3 Monitor platelets by anticoagulation protocol: Yes   Plan: Warfarin 2.5 mg x1 tonight  Daily PT/INR Monitor s/s bleeding  Marguerite Olea, [facility] Clinical Pharmacist Phone [phone]  09/05/2019 12:33 PM

## 2019-09-05 NOTE — TOC Initial Note (Signed)
Transition of Care [facility]) - Initial/Assessment Note    Patient Details  Name: Briana Andrews MRN: [id number] Date of Birth: 10/04/1966  Transition of Care [facility]) CM/SW Contact:    Vinie Sill, [place] Phone Number: 09/05/2019, 3:11 PM  Clinical Narrative:       3:12pm- CSW spoke with the patient's spouse, Tim. CSW introduced self and explained role. CSW discussed PT recommendation of [facility]. He was agreeable to ST rehab at [facility] and had no preferred SNF except somewhere in the [place] area. Patient's  Spouse states no questions or concerns at this time.              1:30pm- patient sleep, CSW unable to complete assessment.   Thurmond Butts, MSW, [facility] Clinical Social Worker [phone]     Expected Discharge Plan: Skilled Nursing Facility Barriers to Discharge: SNF Pending bed offer, Continued Medical Work up   Patient Goals and CMS Choice        Expected Discharge Plan and Services Expected Discharge Plan: [place] arrangements for the past 2 months: Single Family Home                                      Prior Living Arrangements/Services Living arrangements for the past 2 months: Single Family Home Lives with:: Spouse, Self Patient language and need for interpreter reviewed:: Yes        Need for Family Participation in Patient Care: Yes (Comment) Care giver support system in place?: Yes (comment)      Activities of Daily Living      Permission Sought/Granted Permission sought to share information with : Family Supports    Share Information with NAME: Lorrane Mccay  Permission granted to share info w AGENCY: SNFs  Permission granted to share info w Relationship: spouse  Permission granted to share info w Contact Information: [phone]  Emotional Assessment   Attitude/Demeanor/Rapport: Unable to Assess Affect (typically observed): Unable to Assess Orientation: : Oriented to Self Alcohol /  Substance Use: Alcohol Use Psych Involvement: No (comment)  Admission diagnosis:  Shortness of breath [R06.02] Liver failure without hepatic coma, unspecified chronicity (HCC) [K72.90] Suspected COVID-19 virus infection [Z20.828] Abnormal LFTs [R94.5] Patient Active Problem List   Diagnosis Date Noted  . Acute on chronic combined systolic and diastolic CHF (congestive heart failure) ([place])   . GI bleed 08/26/2019  . Pressure injury of skin 08/23/2019  . Acute upper GI bleed   . Shock circulatory ([place])   . Palliative care by specialist   . Goals of care, counseling/discussion   . Advanced care planning/counseling discussion   . Jaundice   . Cerebral embolism with cerebral infarction 08/17/2019  . PVD (peripheral vascular disease) ([place]) 08/17/2019  . 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 Acute CHF (congestive heart failure) ([place]) 08/17/2019  . Abnormal LFTs 08/16/2019  . Acute metabolic encephalopathy 81/11/7508  . SOB (shortness of breath) 08/16/2019  . AKI (acute kidney injury) ([place]) 08/16/2019  . Fall 08/16/2019  . Lactic acidosis 08/16/2019  . Back pain 08/16/2019  . Liver failure without hepatic coma ([place]) 08/16/2019  . Multifocal pneumonia 08/16/2019  . Alcohol abuse   . Tobacco abuse   . Shortness of breath    PCP:  Patient, [name] Pharmacy:   [facility] [address] [place]  [place] [address] Phone: [phone] Fax: [phone]     Social Determinants of Health (SDOH) Interventions    Readmission Risk Interventions No flowsheet data found.

## 2019-09-06 DIAGNOSIS — I240 Acute coronary thrombosis not resulting in myocardial infarction: Secondary | ICD-10-CM

## 2019-09-06 LAB — BASIC METABOLIC PANEL
Anion gap: 8 (ref 5–15)
BUN: 8 mg/dL (ref 6–20)
CO2: 29 mmol/L (ref 22–32)
Calcium: 7.4 mg/dL — ABNORMAL LOW (ref 8.9–10.3)
Chloride: 99 mmol/L (ref 98–111)
Creatinine, Ser: 0.83 mg/dL (ref 0.44–1.00)
GFR calc Af Amer: >60 mL/min (ref >60)
GFR calc non Af Amer: >60 mL/min (ref >60)
Glucose, Bld: 70 mg/dL (ref 70–99)
Potassium: 3.9 mmol/L (ref 3.5–5.1)
Sodium: 136 mmol/L (ref 135–145)

## 2019-09-06 LAB — COOXEMETRY PANEL
Carboxyhemoglobin: 2.2 % — ABNORMAL HIGH (ref 0.5–1.5)
Methemoglobin: 0.5 % (ref 0.0–1.5)
O2 Saturation: 78.6 %
Total hemoglobin: 8.7 g/dL — ABNORMAL LOW (ref 12.0–16.0)

## 2019-09-06 LAB — CBC
HCT: 25 % — ABNORMAL LOW (ref 36.0–46.0)
Hemoglobin: 8.4 g/dL — ABNORMAL LOW (ref 12.0–15.0)
MCH: 32.4 pg (ref 26.0–34.0)
MCHC: 33.6 g/dL (ref 30.0–36.0)
MCV: 96.5 fL (ref 80.0–100.0)
Platelets: 281 10*3/uL (ref 150–400)
RBC: 2.59 MIL/uL — ABNORMAL LOW (ref 3.87–5.11)
RDW: 18.6 % — ABNORMAL HIGH (ref 11.5–15.5)
WBC: 5.6 10*3/uL (ref 4.0–10.5)
nRBC: 0 % (ref 0.0–0.2)

## 2019-09-06 LAB — HEMOGLOBIN AND HEMATOCRIT, BLOOD
HCT: 26.2 % — ABNORMAL LOW (ref 36.0–46.0)
HCT: 25.9 % — ABNORMAL LOW (ref 36.0–46.0)
HCT: 25 % — ABNORMAL LOW (ref 36.0–46.0)
Hemoglobin: 8.5 g/dL — ABNORMAL LOW (ref 12.0–15.0)
Hemoglobin: 8.4 g/dL — ABNORMAL LOW (ref 12.0–15.0)
Hemoglobin: 8.5 g/dL — ABNORMAL LOW (ref 12.0–15.0)

## 2019-09-06 LAB — PROTIME-INR
INR: 1.6 — ABNORMAL HIGH (ref 0.8–1.2)
Prothrombin Time: 18.4 seconds — ABNORMAL HIGH (ref 11.4–15.2)

## 2019-09-06 MED ORDER — WARFARIN SODIUM 2.5 MG PO TABS: 2.5000 mg | ORAL_TABLET | Freq: Once | ORAL | Status: DC

## 2019-09-06 MED ORDER — WARFARIN SODIUM 4 MG PO TABS
4.0000 mg | ORAL_TABLET | Freq: Once | ORAL | Status: AC
  Administered 2019-09-06: 4 mg via ORAL
  Filled 2019-09-06: qty 1

## 2019-09-06 NOTE — Progress Notes (Signed)
Late entry. Per dr. Blaine Hamper. Ok to removed RIJ.  Pt should be in process of getting bed at snf. Awaiting for cardiology to sign off.

## 2019-09-06 NOTE — Progress Notes (Signed)
ANTICOAGULATION CONSULT NOTE - Follow Up Consult  Pharmacy Consult for Heparin> warfarin Indication: DVT, LV thrombus, and acute CVA  Allergies  Allergen Reactions  . Penicillins Other (See Comments)    Unsure of reaction Did it involve swelling of the face/tongue/throat, SOB, or low BP? Unknown Did it involve sudden or severe rash/hives, skin peeling, or any reaction on the inside of your mouth or nose? Unknown Did you need to seek medical attention at a hospital or doctor's office? Unknown When did it last happen?[place] If all above answers are "NO", may proceed with cephalosporin use.    Patient Measurements: Height: 5\' 5"  (165.1 cm) Weight: 177 lb 11.1 oz (80.6 kg) IBW/kg (Calculated) : 57 Heparin Dosing Weight: 75 kg  Vital Signs: Temp: 98.4 F (36.9 C) (10/10 1121) Temp Source: Oral (10/10 1121) BP: 120/63 (10/10 1121) Pulse Rate: 79 (10/10 0510)  Labs: Recent Labs    09/04/19 0342  09/05/19 0433 09/05/19 1010 09/05/19 2048 09/06/19 0113 09/06/19 0305 09/06/19 0615  HGB 8.3*   < > 8.1* 9.2* 8.7* 8.5* 8.4* 8.4*  HCT 25.0*   < > 24.5* 27.8* 25.9* 26.2* 25.0* 25.9*  PLT 247  --  276 304 272  --  281  --   LABPROT 28.8*  --  20.8*  --   --   --   --  18.4*  INR 2.8*  --  1.8*  --   --   --   --  1.6*  CREATININE 0.78  --  0.77  --   --   --   --  0.83   < > = values in this interval not displayed.    Estimated Creatinine Clearance: 81.2 mL/min (by C-G formula based on SCr of 0.83 mg/dL).   Assessment: [name] on heparin for LV thrombus and bilateral lower extremity ischemia w/ right popliteal vein DVT now s/p angioplasty and R/L popliteal thrombectomy of the tibial artery. Heparin therapy complicated by acute CVA and recent GI bleed and need for lower goal. Plans noted for Coumadin when no further procedures   She is s/p mech thrombectomy of L popliteal 9/23. Heparin stopped 09/23 @ 1750 due to a GI bleed. Pt completed course of octreotide. Pt placed  on IV PPI for treatment of ulcers. Heparin to continue due to LV thrombus per Dr. Aundra Dubin.  Heparin stopped 10/7.  INR baseline 1.9>2.3>2.8 >1.8 yesterday. INR remains subtherapeutic and down at 1.6 today after increasing dose to 2.5 mg yesterday. Will increase the dose further tonight and check INR again tomorrow. CBC stable, no overt bleeding noted.  States she will refrain from ETOH after DC   S/p cath NICM, CAD no intervention  Goal of Therapy:   INR 2-3 Monitor platelets by anticoagulation protocol: Yes   Plan: Warfarin 4 mg x1 tonight  Daily PT/INR Monitor s/s bleeding  Richardine Service, PharmD PGY1 Pharmacy Resident Phone: [phone] 09/06/2019  2:43 PM  Please check [email or web address] for unit-specific pharmacy phone numbers.

## 2019-09-06 NOTE — Progress Notes (Signed)
Assessed RIJ CVC as requested due to pt pulled out partially.  No dressing over site, line pulled approximately 3 cm (no markings on line), line sutured in place. Recommend obtaining CXR to confirm tip in SVC or d/c CVC and place PIV.  Luellen Pucker RN notified.

## 2019-09-06 NOTE — TOC Progression Note (Signed)
Transition of Care [facility]) - Progression Note    Patient Details  Name: Briana Andrews MRN: [id number] Date of Birth: August 06, 1966  Transition of Care [facility]) CM/SW [place], [place] Phone Number: [phone] 09/06/2019, 3:52 PM  Clinical Narrative:     CSW checked hub and no bed offers. CSW team will continue to follow for discharge planning.   Expected Discharge Plan: [place] Barriers to Discharge: SNF Pending bed offer, Continued Medical Work up  Expected Discharge Plan and Services Expected Discharge Plan: [place] arrangements for the past 2 months: Single Family Home                                       Social Determinants of Health (SDOH) Interventions    Readmission Risk Interventions No flowsheet data found.

## 2019-09-06 NOTE — Progress Notes (Signed)
Notified MD re pts central line pulled out of place. Request to remove line and insert peripheral.  Awaiting orders.

## 2019-09-06 NOTE — Progress Notes (Deleted)
ANTICOAGULATION CONSULT NOTE - Follow Up Consult  Pharmacy Consult for Heparin> warfarin Indication: DVT, LV thrombus, and acute CVA  Allergies  Allergen Reactions  . Penicillins Other (See Comments)    Unsure of reaction Did it involve swelling of the face/tongue/throat, SOB, or low BP? Unknown Did it involve sudden or severe rash/hives, skin peeling, or any reaction on the inside of your mouth or nose? Unknown Did you need to seek medical attention at a hospital or doctor's office? Unknown When did it last happen?[place] If all above answers are "NO", may proceed with cephalosporin use.    Patient Measurements: Height: 5\' 5"  (165.1 cm) Weight: 177 lb 11.1 oz (80.6 kg) IBW/kg (Calculated) : 57 Heparin Dosing Weight: 75 kg  Vital Signs: Temp: 98.4 F (36.9 C) (10/10 0806) Temp Source: Oral (10/10 0806) BP: 117/68 (10/10 0806) Pulse Rate: 79 (10/10 0510)  Labs: Recent Labs    09/04/19 0342  09/05/19 0433 09/05/19 1010 09/05/19 2048 09/06/19 0113 09/06/19 0305 09/06/19 0615  HGB 8.3*   < > 8.1* 9.2* 8.7* 8.5* 8.4* 8.4*  HCT 25.0*   < > 24.5* 27.8* 25.9* 26.2* 25.0* 25.9*  PLT 247  --  276 304 272  --  281  --   LABPROT 28.8*  --  20.8*  --   --   --   --  18.4*  INR 2.8*  --  1.8*  --   --   --   --  1.6*  CREATININE 0.78  --  0.77  --   --   --   --  0.83   < > = values in this interval not displayed.    Estimated Creatinine Clearance: 81.2 mL/min (by C-G formula based on SCr of 0.83 mg/dL).   Assessment: [name] on heparin for LV thrombus and bilateral lower extremity ischemia w/ right popliteal vein DVT now s/p angioplasty and R/L popliteal thrombectomy of the tibial artery. Heparin therapy complicated by acute CVA and recent GI bleed and need for lower goal. Plans noted for Coumadin when no further procedures   She is s/p mech thrombectomy of L popliteal 9/23. Heparin stopped 09/23 @ 1750 due to a GI bleed. Pt completed course of octreotide. Pt placed  on IV PPI for treatment of ulcers. Heparin to continue due to LV thrombus per Dr. Aundra Dubin.  Heparin stopped 10/7.  INR baseline 1.9>2.3>2.8 >1.8. INR subtherapeutic and down at 1.6 today after increasing dose to 2.5 mg yesterday. Will continue this dose again tonight. CBC stable, no overt bleeding noted.  States she will refrain from ETOH after DC   S/p cath NICM, CAD no intervention  Goal of Therapy:   INR 2-3 Monitor platelets by anticoagulation protocol: Yes   Plan: Warfarin 2.5 mg x1 tonight  Daily PT/INR Monitor s/s bleeding  Richardine Service, PharmD PGY1 Pharmacy Resident Phone: [phone] 09/06/2019  11:12 AM  Please check [email or web address] for unit-specific pharmacy phone numbers.

## 2019-09-06 NOTE — Progress Notes (Signed)
Palliative:   Patient in bed. Spouse at bedside. We discussed GOC and disposition. Patient states she wishes to go home, she says she knows she should go to rehab, however, she is tired of being in the hospital and just wants to go home and do PT/OT there.   Spouse at bedside states if she needed to go to rehab he would want her to go to somewhere close to their home. Patient states that her son would help her at home. Spouse did discuss with her that her 43 yr old son would not always be at home to help her.   Noted patient's and spouses GOC continue to be aggressive medical care with hopes for improvement. Plan for d/c to SNF/rehab then home.   Recommend outpatient Palliative f/u at SNF and home after discharge.   PMT will sign off for now as GOC are set.   Please reconsult if needed.   Thank you for allowing Palliative Medicine to participate in patient's care.   Mariana Kaufman, AGNP-C Palliative Medicine  Time In: 1300  Time Out: 1335 Total Time: 35 minutes  Greater than 50%  of this time was spent counseling and coordinating care related to the above assessment and plan

## 2019-09-06 NOTE — Progress Notes (Signed)
PROGRESS NOTE    Desa Rech  DGL:[id number] DOB: May 06, 1966 DOA: 08/15/2019 PCP: Patient, [name]    Brief Narrative:   53 yo alcoholic woman admitted with R frontal CVA, B popliteal artery occlusions, hepatic failure, GI bleeding. She was found to have new cardiomyopathy with an LV thrombus, cirrhosis, ARF. Underwent B LE thrombectomies, was treated with heparin, stabilized. Has required hemodynamic support w dobutamine. Received PRBC and was able to undergo EGD 9/29 that showed duodenal and gastric ulcers which were treated endoscopically. He has been weaned off of dobutamine. Heparin has been converted to coumadin. She is now on oral protonix.  On 08/29/2019 the patient developed bleeding from multiple sites including her mouth, urine, and rectum. Pharmacy had reduced the dose and goal heparin level earlier in the day. I discussed with pharmacy. The heparin will be held for one hour and then restarted at yet an even lower dose. When it was restarted the patient again had bleeding with continued decline in Hgb. It was again placed on hold on 08/31/2019. It has now been converted to coumadin. Transthoracic Echocardiogram repeated on 08/31/2019. Persistent appearance of large left ventricular mural thrombus. Pelvic Ultrasound to investigate complaints of vaginal bleeding while on heparin: It was unable to evaluate the patient's endometrial stripe.  Interim History:  The patient underwent left and right heart catheterization on 09/02/2019. Conclusions from this study were:  1. Optimized filling pressures.  2. Mild pulmonary hypertension.  3. Preserved cardiac output.  4. Extensive CAD, primarily distal/branch vessel disease. The only interventional target would be 80% proximal RCA stenosis. Could consider RCA PCI if she has exertional symptoms despite medical therapy.   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 Dobutamine weaned to off.  The patient was successfully extubated after EGD and respiratory status is stable on 4L [place]. The patient underwent LHC and RHC today. Conclusions: 1. Optimized filling pressures.  2. Mild pulmonary hypertension.  3. Preserved cardiac output.  4. Extensive CAD, primarily distal/branch vessel disease. The only interventional target would be 80% proximal RCA stenosis. Could consider RCA PCI if she has exertional symptoms despite medical therapy.  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 As per cardiology.  PAD: Bilateral popliteal artery occlusions, suspect cardioembolic from LV thrombus. Now s/p mechanical thrombectomy by vascular. Right foot still looks ischemic but dopplerable pulses.  - Continue anticoagulation, coumadin  Acute on chronic systolic CHF/cardiogenic shock: Card was consulted. echo with EF 15-20%, mild LV dilation, LV thrombus, mild-moderate MR, severely decreased RV systolic function.  LHC/RHC done 10/6 showing normal filling pressures and preserved cardiac output.  There was diffuse coronary disease, especially involving distal and branch vessels, only possible interventional  target was 80% proximal  RCA stenosis.  No intervention at this time given good flow down the vessel and unlikely to significantly improve LV function.  Dr. Shirlee Latch suspect that the cardiomyopathy is primarily nonischemic due to ETOH, though coronary disease may play a role.  Initial cardiogenic shock, off dobutamine now.   - Continue Lasix 40 mg po daily.  - Continue w/ [place]na boots. - Increase spironolactone to 25 mg daily.   - Continue digoxin 0.125.  - Continue Coreg 3.125 mg bid.  - Continue losartan 25 mg daily.  With SBP in 90s-100s, will not transition to [facility], but please do not hold meds unless SBP < 85.  - Not candidate for advanced therapies with ETOH abuse/liver failure.   Hemorrhage from mouth, urine, and rectum per nursing: resolved expect reporting dark stool. Initially pharmacy had reduced the dose and goal heparin level earlier in the day. Discussed with pharmacy. The heparin will be held for one hour and then restarted at yet an even lower dose.  Heparin has been stopped as the patient is now on coumadin. INR 1.8. No further obvious bleeding, although hemoglobin is trending down silgtly. Hgb 9.5 0n 09/03/19 -->8.1 -->9.2 -->8.7 -->8.4 -f/u daily CBC -coumadin per pharm   Abnormal uterine bleeding: Stopped following cessation of heparin. Complete pelvic ultrasound was performed and was not sufficient to evaluate the patient's endometrial stripe. Dr. Marthann Schiller  discussed the patient with Dr. Earlene Plater who is on call for gynecology. She recommends that the patient follow up with gynecology as outpatient. No further inpatient evaluation is necessary.   LV clot on AC:  ASA 81 mg daily. The patient is now therapeutic on coumadin. -Monitor CBC.   GI Bleed: The patient presented with melena with suspicion for upper GI source, intermittent transfusion requirements. EGD demonstrated oozing 53mm pre-pyloric ulcer. This was injected with Epi. Hgb 7.5 overnight and received 1 unit PRBC's. Hemoglobin dropped to 7 again on  08/29/2019 after observation of bleeding in mouth, in urine, and in stool. Heparin is held for one hour and dose reduced for the second time. The patient was transfused with 2 units PRBC's. Bleeding appears to have stopped, but still report dark stool.  - Hgb 9.5 on 09/03/19 -->8.1 -->9.2 -->8.7 -->8.4 -f/u daily CBC  Alcoholism, hyperbilirubinemia:  clinically she has cirrhosis although imaging features not c/w this; LFT pattern not c/w alcoholic hepatitis nor ischemic nor congestive.  -WIl continue lactulose, rifaximin, thiamine and folate.  Encephalopathy: resolved. Combination from critical illness, hepatic, and CVA, still has asterixis, improving overall. Continue lactulose.  AKI: Improving related to initial shock, blood loss.  CVA: likely secondary to emboli from LV clot. BL femoral artery occlusion s/p BL thrombectomy:  RLE does not look great but dopplerable pulses, vascular surgery aware, poor candidate for further procedures, monitor clinically. -on Coumadin  Severe protein calorie malnutrition due to alcoholism:  -Nutrition Consult.  Rt sided pleural effusion without resp distress: likely due to hepatic hydrothorax vs. distrubutive from poor oncotic pressure.  -No indication to drain.   DVT prophylaxis: on Coumadin Code Status: DNR Family Communication: husband at bed side Disposition Plan and Barriers for discharge:  Waiting for SNF bed  Consultants:   Palliative Care  PCCM  Heart Failure Team  Vascular Surgery  Cardiology  Procedures:  EGD/Epinephrine injection  Mechanical Intubation  Central Venous Catheter  Extubation  Arteriogram of right and left lower extremities  Mechanical thrombectomy of the left popliteal and posterior tibial artery using Penumbra CAT 6  device Mechanical thrombectomy of the right popliteal and posterior tibial artery using Penumbra CAT 6 device  Antimicrobials:  Subjective:  Patient is still has some leg pain, has  intermittent dark stool, no nausea vomiting, abdominal pain or diarrhea.  No vaginal bleeding.  No fever or chills.  No chest pain, shortness breath or cough.  Objective: Vitals:   09/05/19 2000 09/05/19 2310 09/06/19 0510 09/06/19 0806  BP: (!) 102/51 121/62 106/64 117/68  Pulse: 75 80 79   Resp: 18 (!) 23 15 16   Temp: 98.7 F (37.1 C) 98.3 F (36.8 C) 98.6 F (37 C) 98.4 F (36.9 C)  TempSrc: Oral Oral Oral Oral  SpO2: 93% 90% 91% 91%  Weight:      Height:        Intake/Output Summary (Last 24 hours) at 09/06/2019 0903 Last data filed at 09/06/2019 0510 Gross per 24 hour  Intake 240 ml  Output 3175 ml  Net -2935 ml   Filed Weights   09/02/19 0500 09/03/19 0500 09/05/19 0550  Weight: 83.9 kg 84.4 kg 80.6 kg    Examination: Physical Exam:  General: Not in acute distress. Pale looking HEENT: PERRL, EOMI, no scleral icterus, No JVD or bruit Cardiac: S1/S2, RRR, No murmurs, gallops or rubs Pulm: Clear to auscultation bilaterally. No rales, wheezing, rhonchi or rubs. Abd: Soft, nondistended, nontender, no rebound pain, no organomegaly, BS present Ext: has trace leg edema.  Right foot and toes still looks ischemic. Musculoskeletal: No joint deformities, erythema, or stiffness, ROM full Skin: No rashes.  Neuro: Alert and oriented X3, cranial nerves II-XII grossly intact, muscle strength 5/5 in all extremeties. Psych: Patient is not psychotic, no suicidal or hemocidal ideation.    Data Reviewed: I have personally reviewed following labs and imaging studies  CBC: Recent Labs  Lab 08/30/19 0956  09/01/19 0439  09/02/19 0526  09/04/19 0342  09/05/19 0433 09/05/19 1010 09/05/19 2048 09/06/19 0113 09/06/19 0305 09/06/19 0615  WBC 8.7   < > 6.8  --  6.9   < > 5.2  --  5.1 5.2 6.2  --  5.6  --   NEUTROABS 6.7  --  4.7  --  4.7  --   --   --   --   --   --   --   --   --   HGB 11.0*   < > 8.5*   < > 9.8*   < > 8.3*   < > 8.1* 9.2* 8.7* 8.5* 8.4* 8.4*  HCT 32.1*   < >  24.0*   < > 29.0*   < > 25.0*   < > 24.5* 27.8* 25.9* 26.2* 25.0* 25.9*  MCV 93.0   < > 93.4  --  94.8   < > 97.3  --  98.4 97.2 96.6  --  96.5  --   PLT 216   < > 209  --  231   < > 247  --  276 304 272  --  281  --    < > = values in this interval not displayed.   Basic Metabolic Panel: Recent Labs  Lab 09/01/19 1038 09/02/19 0526  09/02/19 1012 09/03/19 0503 09/04/19 0342 09/05/19 0433 09/06/19 0615  NA  --  135   < > 140 137 134* 134* 136  K  --  3.8   < > 3.7 4.0 3.2* 3.8 3.9  CL  --  103  --   --  105 100 100  99  CO2  --  23  --   --  22 26 27 29   GLUCOSE  --  76  --   --  69* 87 87 70  BUN  --  12  --   --  9 8 6 8   CREATININE  --  0.82  --   --  0.91 0.78 0.77 0.83  CALCIUM  --  7.5*  --   --  7.7* 7.3* 7.3* 7.4*  MG 1.4* 1.9  --   --   --   --   --   --    < > = values in this interval not displayed.   GFR: Estimated Creatinine Clearance: 81.2 mL/min (by C-G formula based on SCr of 0.83 mg/dL). Liver Function Tests: Recent Labs  Lab 08/31/19 0318 09/01/19 0439 09/02/19 0526 09/03/19 0503 09/05/19 1010  AST 53* 37 41 42* 36  ALT 36 29 33 35 33  ALKPHOS 137* 103 111 113 114  BILITOT 3.5* 2.9* 3.4* 3.3* 3.2*  PROT 4.8* 4.6* 4.9* 5.3* 5.4*  ALBUMIN 1.0* 1.2* 1.3* 1.3* 1.2*   No results for input(s): LIPASE, AMYLASE in the last 168 hours. Recent Labs  Lab 09/05/19 1010  AMMONIA 15   Coagulation Profile: Recent Labs  Lab 09/01/19 1038 09/03/19 0503 09/04/19 0342 09/05/19 0433 09/06/19 0615  INR 1.9* 2.3* 2.8* 1.8* 1.6*   Cardiac Enzymes: No results for input(s): CKTOTAL, CKMB, CKMBINDEX, TROPONINI in the last 168 hours. BNP (last 3 results) No results for input(s): PROBNP in the last 8760 hours. HbA1C: No results for input(s): HGBA1C in the last 72 hours. CBG: Recent Labs  Lab 08/31/19 0722 08/31/19 0748 08/31/19 0837 08/31/19 2124 08/31/19 2253  GLUCAP 53* 48* 70 67* 95   Lipid Profile: No results for input(s): CHOL, HDL, LDLCALC, TRIG,  CHOLHDL, LDLDIRECT in the last 72 hours. Thyroid Function Tests: No results for input(s): TSH, T4TOTAL, FREET4, T3FREE, THYROIDAB in the last 72 hours. Anemia Panel: Recent Labs    09/05/19 1010  FERRITIN 592*  TIBC 108*  IRON 100   Sepsis Labs: No results for input(s): PROCALCITON, LATICACIDVEN in the last 168 hours.  No results found for this or any previous visit (from the past 240 hour(s)).   RN Pressure Injury Documentation: Pressure Injury 08/20/19 Heel Left Stage I -  Intact skin with non-blanchable redness of a localized area usually over a bony prominence. heel boggy feeling alightly reddened 3x3 (Active)  08/20/19 1500  Location: Heel  Location Orientation: Left  Staging: Stage I -  Intact skin with non-blanchable redness of a localized area usually over a bony prominence.  Wound Description (Comments): heel boggy feeling alightly reddened 3x3  Present on Admission: Yes     Pressure Injury Heel Right Stage I -  Intact skin with non-blanchable redness of a localized area usually over a bony prominence. heel boggy feeling and reddened measuring 3x3 (Active)     Location: Heel  Location Orientation: Right  Staging: Stage I -  Intact skin with non-blanchable redness of a localized area usually over a bony prominence.  Wound Description (Comments): heel boggy feeling and reddened measuring 3x3  Present on Admission: Yes    Radiology Studies: No results found.      Scheduled Meds: . carvedilol  3.125 mg Oral BID WC  . chlorhexidine  15 mL Mouth/Throat BID  . Chlorhexidine Gluconate Cloth  6 each Topical Daily  . digoxin  0.125 mg Oral Daily  . feeding supplement  1 Container Oral TID BM  . folic acid  1 mg Oral Daily  . furosemide  40 mg Oral Daily  . gabapentin  200 mg Oral BID  . losartan  25 mg Oral Daily  . mouth rinse  15 mL Mouth Rinse BID  . multivitamin with minerals  1 tablet Per Tube Daily  . nicotine  21 mg Transdermal Daily  . pantoprazole  40 mg  Oral BID  . rifaximin  550 mg Oral BID  . rosuvastatin  5 mg Oral q1800  . sodium chloride flush  10 mL Intravenous Q12H  . sodium chloride flush  10 mL Intravenous Q12H  . sodium chloride flush  3 mL Intravenous Q12H  . sodium chloride flush  3 mL Intravenous Q12H  . spironolactone  25 mg Oral QHS  . thiamine  100 mg Oral Daily  . Warfarin - Pharmacist Dosing Inpatient   Does not apply q1800   Continuous Infusions: . sodium chloride 10 mL/hr at 09/02/19 0318  . sodium chloride    . magnesium sulfate bolus IVPB       LOS: 21 days    Time spent: 5935    Lorretta HarpXilin Corry Storie, DO [facility] PAGER is on AMION  If 7PM-7AM, please contact night-coverage [email or web address] Password [email or web address] 09/06/2019, 9:03 AM

## 2019-09-07 LAB — CBC
HCT: 24.8 % — ABNORMAL LOW (ref 36.0–46.0)
Hemoglobin: 8.3 g/dL — ABNORMAL LOW (ref 12.0–15.0)
MCH: 31.9 pg (ref 26.0–34.0)
MCHC: 33.5 g/dL (ref 30.0–36.0)
MCV: 95.4 fL (ref 80.0–100.0)
Platelets: 312 10*3/uL (ref 150–400)
RBC: 2.6 MIL/uL — ABNORMAL LOW (ref 3.87–5.11)
RDW: 18.6 % — ABNORMAL HIGH (ref 11.5–15.5)
WBC: 5.9 10*3/uL (ref 4.0–10.5)
nRBC: 0 % (ref 0.0–0.2)

## 2019-09-07 LAB — BASIC METABOLIC PANEL
Anion gap: 7 (ref 5–15)
BUN: 7 mg/dL (ref 6–20)
CO2: 30 mmol/L (ref 22–32)
Calcium: 7.3 mg/dL — ABNORMAL LOW (ref 8.9–10.3)
Chloride: 97 mmol/L — ABNORMAL LOW (ref 98–111)
Creatinine, Ser: 0.81 mg/dL (ref 0.44–1.00)
GFR calc Af Amer: >60 mL/min (ref >60)
GFR calc non Af Amer: >60 mL/min (ref >60)
Glucose, Bld: 74 mg/dL (ref 70–99)
Potassium: 3.5 mmol/L (ref 3.5–5.1)
Sodium: 134 mmol/L — ABNORMAL LOW (ref 135–145)

## 2019-09-07 LAB — PROTIME-INR
INR: 1.8 — ABNORMAL HIGH (ref 0.8–1.2)
Prothrombin Time: 20.4 seconds — ABNORMAL HIGH (ref 11.4–15.2)

## 2019-09-07 LAB — MAGNESIUM: Magnesium: 1.4 mg/dL — ABNORMAL LOW (ref 1.7–2.4)

## 2019-09-07 MED ORDER — WARFARIN SODIUM 4 MG PO TABS
4.0000 mg | ORAL_TABLET | Freq: Once | ORAL | Status: AC
  Administered 2019-09-07: 4 mg via ORAL
  Filled 2019-09-07: qty 1

## 2019-09-07 MED ORDER — POTASSIUM CHLORIDE CRYS ER 20 MEQ PO TBCR
40.0000 meq | EXTENDED_RELEASE_TABLET | Freq: Once | ORAL | Status: AC
  Administered 2019-09-07: 40 meq via ORAL
  Filled 2019-09-07: qty 2

## 2019-09-07 MED ORDER — MAGNESIUM SULFATE 4 GM/100ML IV SOLN
4.0000 g | Freq: Once | INTRAVENOUS | Status: AC
  Administered 2019-09-07: 4 g via INTRAVENOUS
  Filled 2019-09-07: qty 100

## 2019-09-07 NOTE — Progress Notes (Signed)
[facility] PROGRESS NOTE  Briana Andrews XBJ:[id number] DOB: 04/21/1966 DOA: 08/15/2019 PCP: Patient, [name]  Brief summary   53 yo alcoholic woman admitted with R frontal CVA, B popliteal artery occlusions, hepatic failure, GI bleeding. She was found to have new cardiomyopathy with an LV thrombus, cirrhosis, ARF. Underwent B LE thrombectomies, was treated with heparin, stabilized. Has required hemodynamic support w dobutamine. Received PRBC and was able to undergo EGD 9/29 that showed duodenal and gastric ulcers which were treated endoscopically.He has been weaned off of dobutamine. Heparin has been converted to coumadin. She is now on oral protonix.  On 08/29/2019 the patient developed bleeding from multiple sites including her mouth, urine, and rectum. Pharmacy had reduced the dose and goal heparin level earlier in the day. I discussed with pharmacy. The heparin will be held for one hour and then restarted at yet an even lower dose. When it was restarted the patient again had bleeding with continued decline in Hgb. It was again placed on hold on 08/31/2019. It hasnow been converted to coumadin. Transthoracic Echocardiogram repeated on 08/31/2019. Persistent appearance of large left ventricular mural thrombus. Pelvic Ultrasound to investigate complaints of vaginal bleeding while on heparin: It was unable to evaluate the patient's endometrial stripe.  Interim History:  The patient underwent left and right heart catheterization on 09/02/2019. Conclusions from this study were:  1. Optimized filling pressures.  2. Mild pulmonary hypertension.  3. Preserved cardiac output.  4. Extensive CAD, primarily distal/branch vessel disease. The only interventional target would be 80% proximal RCA stenosis. Could consider RCA PCI if she has exertional symptoms despite medical therapy.  Assessment/Plan:  Biventricular failure some combination ischemic event vs stress-induced (from shock state,  acidemia, ABLA, ?aspiration-induced sepsis). Dobutamine weaned to off. The patient was successfully extubated after EGD and respiratory status is stable on 4L [place]. The patient underwent LHC and RHC today. Conclusions: 1. Optimized filling pressures.  2. Mild pulmonary hypertension.  3. Preserved cardiac output.  4. Extensive CAD, primarily distal/branch vessel disease. The only interventional target would be 80% proximal RCA stenosis. Could consider RCA PCI if she has exertional symptoms despite medical therapy.  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 As per cardiology.  PAD: Bilateral popliteal artery occlusions, suspect cardioembolic from LV thrombus. Now s/p mechanical thrombectomy by vascular. Right foot still looks ischemic but dopplerable pulses.  - Continue anticoagulation, coumadin  Acute on chronic systolic CHF/cardiogenic shock: Card was consulted. echo with EF 15-20%, mild LV dilation, LV thrombus, mild-moderate MR, severely decreased RV systolic function. LHC/RHC done 10/6 showing normal filling pressures and preserved cardiac output. There was diffuse coronary disease, especially involving distal and branch vessels, only possible interventional target was 80% proximal RCA stenosis. No intervention at this time given good flow down the vessel and unlikely to significantly improve LV function. Dr. Shirlee LatchMcLean suspect that the cardiomyopathy is primarily nonischemic due to ETOH, though coronary disease may play a role. Initial cardiogenic shock, off dobutamine now.  - Continue Lasix 40 mg po daily. -Continue w/ Unna boots. -Increase spironolactone to 25 mg daily. - Continue digoxin 0.125.  - Continue Coreg 3.125 mg bid.  -Continue losartan 25 mg daily. With SBP in 90s-100s, will not transition to [facility], but please do not hold meds unless SBP <85.  - Not candidate for advanced therapies with ETOH abuse/liver failure.   Hemorrhage from mouth, urine, and  rectum per nursing: resolved expect reporting dark stool. Initially pharmacy had reduced the dose and goal heparin level  earlier in the day. Discussed with pharmacy. The heparin will be held for one hour and then restarted at yet an even lower dose. Heparin has been stopped as the patient is now on coumadin. INR 1.8. No further obvious bleeding, although hemoglobin is trending down silgtly. Hgb 9.5 0n 09/03/19 -->8.1 -->9.2 -->8.7 -->8.4->8.3 -f/u daily CBC -coumadin per pharm   Abnormal uterine bleeding: Stopped following cessation of heparin. Complete pelvic ultrasound wasperformed and was not sufficient to evaluate the patient's endometrial stripe. Dr. Marthann Schiller  discussed the patient with Dr. Earlene Plater who is on call for gynecology. She recommends that the patient follow up with gynecology as outpatient. No further inpatient evaluation is necessary.   LV clot on AC: ASA 81 mg daily. The patient is now therapeutic on coumadin. -Monitor CBC.  GI Bleed: The patient presented with melena with suspicion for upper GI source, intermittent transfusion requirements. EGD demonstrated oozing 83mm pre-pyloric ulcer. This was injected with Epi. Hgb 7.5 overnight and received 1 unit PRBC's. Hemoglobin dropped to 7 again on 08/29/2019 after observation of bleeding in mouth, in urine, and in stool. Heparin is held for one hour and dose reduced for the second time. The patient was transfused with 2 units PRBC's. Bleedingappears to have stopped, but still report dark stool.  - Hgb 9.5 on 09/03/19 -->8.1 -->9.2 -->8.7 -->8.4->8.3 -f/u daily CBC  Alcoholism, hyperbilirubinemia:  clinically she has cirrhosis although imaging features not c/w this; LFT pattern not c/w alcoholic hepatitis nor ischemic nor congestive.  -WIl continue lactulose, rifaximin, thiamine and folate.  Encephalopathy: resolved. Combination from critical illness, hepatic, and CVA, still has asterixis, improving overall. Continue  lactulose.  AKI: Improving related to initial shock, blood loss.  CVA: likely secondary to emboli from LV clot. BL femoral artery occlusion s/p BL thrombectomy: RLE does not look great but dopplerable pulses, vascular surgery aware, poor candidate for further procedures, monitor clinically. -on Coumadin  Severe protein calorie malnutrition due to alcoholism:  -Nutrition Consult.  Rt sided pleural effusion without resp distress: likely due to hepatic hydrothorax vs. distrubutive from poor oncotic pressure.  -No indication to drain.  Code Status: DNR Family Communication: d/w patient, RN (indicate person spoken with, relationship, and if by phone, the number) Disposition Plan: awaiting SNF   Consultants:  Palliative Care  PCCM  Heart Failure Team  Vascular Surgery  Cardiology  Procedures:  EGD/Epinephrine injection  Mechanical Intubation  Central Venous Catheter  Extubation  Arteriogram of right and left lower extremities  Mechanical thrombectomy of the left popliteal and posterior tibial artery using Penumbra CAT 6 device Mechanical thrombectomy of the right popliteal and posterior tibial artery using Penumbra CAT 6 device  Antibiotics: Anti-infectives (From admission, onward)   Start     Dose/Rate Route Frequency Ordered Stop   08/28/19 2200  rifaximin (XIFAXAN) tablet 550 mg     550 mg Oral 2 times daily 08/28/19 0934     08/26/19 1000  rifaximin (XIFAXAN) tablet 550 mg  Status:  Discontinued     550 mg Per Tube 2 times daily 08/25/19 2113 08/28/19 0935   08/20/19 1800  cefTRIAXone (ROCEPHIN) 2 g in sodium chloride 0.9 % 100 mL IVPB  Status:  Discontinued     2 g 200 mL/hr over 30 Minutes Intravenous Every 24 hours 08/20/19 1600 08/23/19 0825   08/18/19 0700  levofloxacin (LEVAQUIN) tablet 750 mg  Status:  Discontinued     750 mg Oral Every 48 hours 08/17/19 1205 08/20/19 1600   08/16/19 1115  rifaximin (XIFAXAN) tablet 550 mg  Status:  Discontinued      550 mg Oral 2 times daily 08/16/19 1114 08/25/19 2113   08/16/19 0700  levofloxacin (LEVAQUIN) IVPB 750 mg  Status:  Discontinued     750 mg 100 mL/hr over 90 Minutes Intravenous Every 48 hours 08/16/19 0616 08/17/19 1205        (indicate start date, and stop date if known)  HPI/Subjective: No acute distress. She reports feeling okay, no acute bleeding. Tolerating coumadin.  Awaiting for SNF  Objective: Vitals:   09/07/19 0810 09/07/19 1021  BP:    Pulse: 79 69  Resp:    Temp:    SpO2:      Intake/Output Summary (Last 24 hours) at 09/07/2019 1113 Last data filed at 09/07/2019 0735 Gross per 24 hour  Intake 586 ml  Output 2600 ml  Net -2014 ml   Filed Weights   09/03/19 0500 09/05/19 0550 09/07/19 0319  Weight: 84.4 kg 80.6 kg 72.9 kg    Exam:   General:  No distress   Cardiovascular: s1,s2 rrr  Respiratory: CTA BL  Abdomen: soft, nt   Musculoskeletal: mild pedal edema    Data Reviewed: Basic Metabolic Panel: Recent Labs  Lab 09/01/19 1038 09/02/19 0526  09/03/19 0503 09/04/19 0342 09/05/19 0433 09/06/19 0615 09/07/19 0418  NA  --  135   < > 137 134* 134* 136 134*  K  --  3.8   < > 4.0 3.2* 3.8 3.9 3.5  CL  --  103  --  105 100 100 99 97*  CO2  --  23  --  22 26 27 29 30   GLUCOSE  --  76  --  69* 87 87 70 74  BUN  --  12  --  9 8 6 8 7   CREATININE  --  0.82  --  0.91 0.78 0.77 0.83 0.81  CALCIUM  --  7.5*  --  7.7* 7.3* 7.3* 7.4* 7.3*  MG 1.4* 1.9  --   --   --   --   --  1.4*   < > = values in this interval not displayed.   Liver Function Tests: Recent Labs  Lab 09/01/19 0439 09/02/19 0526 09/03/19 0503 09/05/19 1010  AST 37 41 42* 36  ALT 29 33 35 33  ALKPHOS 103 111 113 114  BILITOT 2.9* 3.4* 3.3* 3.2*  PROT 4.6* 4.9* 5.3* 5.4*  ALBUMIN 1.2* 1.3* 1.3* 1.2*   No results for input(s): LIPASE, AMYLASE in the last 168 hours. Recent Labs  Lab 09/05/19 1010  AMMONIA 15   CBC: Recent Labs  Lab 09/01/19 0439  09/02/19 0526   09/05/19 0433 09/05/19 1010 09/05/19 2048 09/06/19 0113 09/06/19 0305 09/06/19 0615 09/06/19 1607 09/07/19 0418  WBC 6.8  --  6.9   < > 5.1 5.2 6.2  --  5.6  --   --  5.9  NEUTROABS 4.7  --  4.7  --   --   --   --   --   --   --   --   --   HGB 8.5*   < > 9.8*   < > 8.1* 9.2* 8.7* 8.5* 8.4* 8.4* 8.5* 8.3*  HCT 24.0*   < > 29.0*   < > 24.5* 27.8* 25.9* 26.2* 25.0* 25.9* 25.0* 24.8*  MCV 93.4  --  94.8   < > 98.4 97.2 96.6  --  96.5  --   --  95.4  PLT 209  --  231   < > 276 304 272  --  281  --   --  312   < > = values in this interval not displayed.   Cardiac Enzymes: No results for input(s): CKTOTAL, CKMB, CKMBINDEX, TROPONINI in the last 168 hours. BNP (last 3 results) No results for input(s): BNP in the last 8760 hours.  ProBNP (last 3 results) No results for input(s): PROBNP in the last 8760 hours.  CBG: Recent Labs  Lab 08/31/19 2124 08/31/19 2253  GLUCAP 67* 95    No results found for this or any previous visit (from the past 240 hour(s)).   Studies: No results found.  Scheduled Meds: . carvedilol  3.125 mg Oral BID WC  . Chlorhexidine Gluconate Cloth  6 each Topical Daily  . digoxin  0.125 mg Oral Daily  . feeding supplement  1 Container Oral TID BM  . folic acid  1 mg Oral Daily  . furosemide  40 mg Oral Daily  . gabapentin  200 mg Oral BID  . losartan  25 mg Oral Daily  . mouth rinse  15 mL Mouth Rinse BID  . multivitamin with minerals  1 tablet Per Tube Daily  . nicotine  21 mg Transdermal Daily  . pantoprazole  40 mg Oral BID  . rifaximin  550 mg Oral BID  . rosuvastatin  5 mg Oral q1800  . spironolactone  25 mg Oral QHS  . thiamine  100 mg Oral Daily  . warfarin  4 mg Oral ONCE-1800  . Warfarin - Pharmacist Dosing Inpatient   Does not apply q1800   Continuous Infusions: . sodium chloride 10 mL/hr at 09/02/19 0318  . magnesium sulfate bolus IVPB      Principal Problem:   Cerebral embolism with cerebral infarction Active Problems:   Abnormal  LFTs   Acute metabolic encephalopathy   AKI (acute kidney injury) ([place])   Fall   Lactic acidosis   Back pain   Liver failure without hepatic coma (HCC)   Multifocal pneumonia   PVD (peripheral vascular disease) (HCC)   Left ventricular apical thrombus without MI ([place])   Acute CHF (congestive heart failure) ([place])   Jaundice   Palliative care by specialist   Goals of care, counseling/discussion   Advanced care planning/counseling discussion   Acute upper GI bleed   Pressure injury of skin   GI bleed   Acute on chronic systolic CHF (congestive heart failure) ([place])   Endotracheally intubated    Time spent: >25 minutes     Kinnie Feil  [facility] Pager [phone]. If 7PM-7AM, please contact night-coverage at [email or web address], password [facility] 09/07/2019, 11:13 AM  LOS: 22 days

## 2019-09-07 NOTE — Progress Notes (Signed)
ANTICOAGULATION CONSULT NOTE - Follow Up Consult  Pharmacy Consult for Heparin> warfarin Indication: DVT, LV thrombus, and acute CVA  Allergies  Allergen Reactions  . Penicillins Other (See Comments)    Unsure of reaction Did it involve swelling of the face/tongue/throat, SOB, or low BP? Unknown Did it involve sudden or severe rash/hives, skin peeling, or any reaction on the inside of your mouth or nose? Unknown Did you need to seek medical attention at a hospital or doctor's office? Unknown When did it last happen?[place] If all above answers are "NO", may proceed with cephalosporin use.    Patient Measurements: Height: 5\' 5"  (165.1 cm) Weight: 160 lb 11.5 oz (72.9 kg) IBW/kg (Calculated) : 57 Heparin Dosing Weight: 75 kg  Vital Signs: Temp: 98.4 F (36.9 C) (10/11 0734) Temp Source: Oral (10/11 0734) BP: 112/60 (10/11 0734) Pulse Rate: 79 (10/11 0810)  Labs: Recent Labs    09/05/19 0433  09/05/19 2048  09/06/19 0305 09/06/19 0615 09/06/19 1607 09/07/19 0418  HGB 8.1*   < > 8.7*   < > 8.4* 8.4* 8.5* 8.3*  HCT 24.5*   < > 25.9*   < > 25.0* 25.9* 25.0* 24.8*  PLT 276   < > 272  --  281  --   --  312  LABPROT 20.8*  --   --   --   --  18.4*  --  20.4*  INR 1.8*  --   --   --   --  1.6*  --  1.8*  CREATININE 0.77  --   --   --   --  0.83  --  0.81   < > = values in this interval not displayed.    Estimated Creatinine Clearance: 79.5 mL/min (by C-G formula based on SCr of 0.81 mg/dL).   Assessment: [name] on heparin for LV thrombus and bilateral lower extremity ischemia w/ right popliteal vein DVT now s/p angioplasty and R/L popliteal thrombectomy of the tibial artery. Heparin therapy complicated by acute CVA and recent GI bleed and need for lower goal. Plans noted for Coumadin when no further procedures   She is s/p mech thrombectomy of L popliteal 9/23. Heparin stopped 09/23 @ 1750 due to a GI bleed. Pt completed course of octreotide. Pt placed on IV PPI  for treatment of ulcers. Heparin to continue due to LV thrombus per Dr. Aundra Dubin.  Heparin stopped 10/7.  INR baseline 1.9>2.3>2.8 >1.8>1.6 yesterday. INR increased but remains subtherapeutic  at 1.8 today after increasing dose to 4 mg yesterday. Will continue this dose tonight and check INR again tomorrow. CBC stable, no overt bleeding noted.  States she will refrain from ETOH after DC   S/p cath NICM, CAD no intervention  Goal of Therapy:   INR 2-3 Monitor platelets by anticoagulation protocol: Yes   Plan: Warfarin 4 mg x1 tonight  Daily PT/INR Monitor s/s bleeding  Richardine Service, PharmD PGY1 Pharmacy Resident Phone: [phone] 09/07/2019  8:59 AM  Please check [email or web address] for unit-specific pharmacy phone numbers.

## 2019-09-07 NOTE — Progress Notes (Signed)
Orthopedic Tech Progress Note Patient Details:  Briana Andrews 03-10-1966 [id number] Removed old unna boot and bandage to heel of leg foot. Washed and moisturized leg and showed RN how heel was looking. Then reapplied new unna boot. Ortho Devices Type of Ortho Device: Haematologist Ortho Device/Splint Location: LLE Ortho Device/Splint Interventions: Removal, Adjustment, Application, Ordered   Post Interventions Patient Tolerated: Well Instructions Provided: Care of device, Adjustment of device   [name] 09/07/2019, 9:55 AM

## 2019-09-08 LAB — CBC
HCT: 25.3 % — ABNORMAL LOW (ref 36.0–46.0)
Hemoglobin: 8.4 g/dL — ABNORMAL LOW (ref 12.0–15.0)
MCH: 31.9 pg (ref 26.0–34.0)
MCHC: 33.2 g/dL (ref 30.0–36.0)
MCV: 96.2 fL (ref 80.0–100.0)
Platelets: 381 10*3/uL (ref 150–400)
RBC: 2.63 MIL/uL — ABNORMAL LOW (ref 3.87–5.11)
RDW: 18.6 % — ABNORMAL HIGH (ref 11.5–15.5)
WBC: 6.1 10*3/uL (ref 4.0–10.5)
nRBC: 0 % (ref 0.0–0.2)

## 2019-09-08 LAB — BASIC METABOLIC PANEL
Anion gap: 8 (ref 5–15)
BUN: 10 mg/dL (ref 6–20)
CO2: 29 mmol/L (ref 22–32)
Calcium: 7.5 mg/dL — ABNORMAL LOW (ref 8.9–10.3)
Chloride: 97 mmol/L — ABNORMAL LOW (ref 98–111)
Creatinine, Ser: 0.99 mg/dL (ref 0.44–1.00)
GFR calc Af Amer: >60 mL/min (ref >60)
GFR calc non Af Amer: >60 mL/min (ref >60)
Glucose, Bld: 75 mg/dL (ref 70–99)
Potassium: 4.1 mmol/L (ref 3.5–5.1)
Sodium: 134 mmol/L — ABNORMAL LOW (ref 135–145)

## 2019-09-08 LAB — MAGNESIUM: Magnesium: 2.1 mg/dL (ref 1.7–2.4)

## 2019-09-08 LAB — PROTIME-INR
INR: 2.5 — ABNORMAL HIGH (ref 0.8–1.2)
Prothrombin Time: 26.6 seconds — ABNORMAL HIGH (ref 11.4–15.2)

## 2019-09-08 MED ORDER — WARFARIN SODIUM 1 MG PO TABS
1.0000 mg | ORAL_TABLET | Freq: Once | ORAL | Status: AC
  Administered 2019-09-08: 1 mg via ORAL
  Filled 2019-09-08: qty 1

## 2019-09-08 NOTE — Progress Notes (Signed)
ANTICOAGULATION CONSULT NOTE - Follow Up Consult  Pharmacy Consult for Warfarin Indication: DVT, LV thrombus, and acute CVA  Allergies  Allergen Reactions  . Penicillins Other (See Comments)    Unsure of reaction Did it involve swelling of the face/tongue/throat, SOB, or low BP? Unknown Did it involve sudden or severe rash/hives, skin peeling, or any reaction on the inside of your mouth or nose? Unknown Did you need to seek medical attention at a hospital or doctor's office? Unknown When did it last happen?[place] If all above answers are "NO", may proceed with cephalosporin use.    Patient Measurements: Height: 5\' 5"  (165.1 cm) Weight: 153 lb 14.1 oz (69.8 kg) IBW/kg (Calculated) : 57 Heparin Dosing Weight: 75 kg  Vital Signs: Temp: 98.5 F (36.9 C) (10/12 0748) Temp Source: Oral (10/12 0748) BP: 106/58 (10/12 0748) Pulse Rate: 73 (10/12 0748)  Labs: Recent Labs    09/06/19 0305 09/06/19 0615 09/06/19 1607 09/07/19 0418 09/08/19 0418  HGB 8.4* 8.4* 8.5* 8.3* 8.4*  HCT 25.0* 25.9* 25.0* 24.8* 25.3*  PLT 281  --   --  312 381  LABPROT  --  18.4*  --  20.4* 26.6*  INR  --  1.6*  --  1.8* 2.5*  CREATININE  --  0.83  --  0.81 0.99    Estimated Creatinine Clearance: 63.7 mL/min (by C-G formula based on SCr of 0.99 mg/dL).   Assessment: [name] on heparin for LV thrombus and bilateral lower extremity ischemia w/ right popliteal vein DVT now s/p angioplasty and R/L popliteal thrombectomy of the tibial artery. Heparin therapy complicated by acute CVA and recent GI bleed and need for lower goal. Plans noted for Coumadin when no further procedures   She is s/p mech thrombectomy of L popliteal 9/23. Heparin stopped 09/23 @ 1750 due to a GI bleed. Pt completed course of octreotide. Pt placed on IV PPI for treatment of ulcers. Heparin to continue due to LV thrombus per Dr. Aundra Dubin.  Heparin stopped 10/7.  INR has now trended up to 2.5 with significant increase  overnight after two boosted two days. CBC low but stable, no overt bleeding noted.  States she will refrain from ETOH after DC   Goal of Therapy:   INR 2-3 Monitor platelets by anticoagulation protocol: Yes   Plan: Warfarin 1mg  tonight Daily PT/INR Monitor s/s bleeding  Erin Hearing PharmD., BCPS Clinical Pharmacist 09/08/2019 8:18 AM  Please check [email or web address] for unit-specific pharmacy phone numbers.

## 2019-09-08 NOTE — Progress Notes (Signed)
[facility] PROGRESS NOTE  Briana Andrews HEN:[id number] DOB: 09-09-1966 DOA: 08/15/2019 PCP: Patient, [name]  Brief summary   53 yo alcoholic woman admitted with R frontal CVA, B popliteal artery occlusions, hepatic failure, GI bleeding. She was found to have new cardiomyopathy with an LV thrombus, cirrhosis, ARF. Underwent B LE thrombectomies, was treated with heparin, stabilized. Has required hemodynamic support w dobutamine. Received PRBC and was able to undergo EGD 9/29 that showed duodenal and gastric ulcers which were treated endoscopically.He has been weaned off of dobutamine. Heparin has been converted to coumadin. She is now on oral protonix.  On 08/29/2019 the patient developed bleeding from multiple sites including her mouth, urine, and rectum.  Patient's heparin was stopped for 1 hour and then restarted but her dose was reduced.  When it was restarted the patient again had bleeding with continued decline in Hgb. It was again placed on hold on 08/31/2019. It hasnow been converted to coumadin. Transthoracic Echocardiogram repeated on 08/31/2019. Persistent appearance of large left ventricular mural thrombus. Pelvic Ultrasound to investigate complaints of vaginal bleeding while on heparin: It was unable to evaluate the patient's endometrial stripe.  The patient underwent left and right heart catheterization on 09/02/2019. Conclusions from this study were:  1. Optimized filling pressures.  2. Mild pulmonary hypertension.  3. Preserved cardiac output.  4. Extensive CAD, primarily distal/branch vessel disease. The only interventional target would be 80% proximal RCA stenosis. Could consider RCA PCI if she has exertional symptoms despite medical therapy.  Assessment/Plan:  Biventricular failure some combination ischemic event vs stress-induced (from shock state, acidemia, ABLA, ?aspiration-induced sepsis). Dobutamine weaned to off. The patient was successfully extubated after EGD  and respiratory status is stable on room air. The patient underwent LHC and RHC. Conclusions: 1. Optimized filling pressures.  2. Mild pulmonary hypertension.  3. Preserved cardiac output.  4. Extensive CAD, primarily distal/branch vessel disease. The only interventional target would be 80% proximal RCA stenosis. Could consider RCA PCI if she has exertional symptoms despite medical therapy.  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 As per cardiology.  PAD: Bilateral popliteal artery occlusions, suspect cardioembolic from LV thrombus. Now s/p mechanical thrombectomy by vascular. Right foot still looks ischemic but dopplerable pulses.  - Continue anticoagulation, coumadin  Acute on chronic systolic CHF/cardiogenic shock: Card was consulted. echo with EF 15-20%, mild LV dilation, LV thrombus, mild-moderate MR, severely decreased RV systolic function. LHC/RHC done 10/6 showing normal filling pressures and preserved cardiac output. There was diffuse coronary disease, especially involving distal and branch vessels, only possible interventional target was 80% proximal RCA stenosis. No intervention at this time given good flow down the vessel and unlikely to significantly improve LV function. Dr. Shirlee Latch suspect that the cardiomyopathy is primarily nonischemic due to ETOH, though coronary disease may play a role. Initial cardiogenic shock, off dobutamine now.  - Continue Lasix 40 mg po daily. -Continue w/ Unna boots. -Increase spironolactone to 25 mg daily. - Continue digoxin 0.125.  - Continue Coreg 3.125 mg bid.  -Continue losartan 25 mg daily. With SBP in 90s-100s, will not transition to [facility], but please do not hold meds unless SBP <85.  - Not candidate for advanced therapies with ETOH abuse/liver failure.   Hemorrhage from mouth, urine, and rectum per nursing: resolved expect reporting dark stool.  Heparin has been stopped as the patient is now on coumadin. INR  1.8. No further obvious bleeding, hemoglobin has remained stable over 8.  Transfuse if less than 7. -coumadin  per pharm .  INR therapeutic at 2.5 today.  Abnormal uterine bleeding: Stopped following cessation of heparin. Complete pelvic ultrasound wasperformed and was not sufficient to evaluate the patient's endometrial stripe. Dr. Konrad Penta  discussed the patient with Dr. Rosana Hoes who is on call for gynecology. She recommends that the patient follow up with gynecology as outpatient. No further inpatient evaluation is necessary.   LV clot on AC: ASA 81 mg daily. The patient is now therapeutic on coumadin. -Monitor CBC.  GI Bleed: The patient presented with melena with suspicion for upper GI source, intermittent transfusion requirements. EGD demonstrated oozing 23mm pre-pyloric ulcer. This was injected with Epi. Hgb 7.5 overnight and received 1 unit PRBC's. Hemoglobin dropped to 7 again on 08/29/2019 after observation of bleeding in mouth, in urine, and in stool.The patient was transfused with 2 units PRBC's. Bleedingappears to have stopped, hemoglobin remains a stable over 8. -f/u daily CBC  Alcoholism, hyperbilirubinemia:  clinically she has cirrhosis although imaging features not c/w this; LFT pattern not c/w alcoholic hepatitis nor ischemic nor congestive.  -WIl continue lactulose, rifaximin, thiamine and folate.  Encephalopathy: resolved.  AKI: Resolved.  CVA: likely secondary to emboli from LV clot. BL femoral artery occlusion s/p BL thrombectomy: RLE does not look great but dopplerable pulses, vascular surgery aware, poor candidate for further procedures, monitor clinically. -on Coumadin  Severe protein calorie malnutrition due to alcoholism:  -Nutrition Consult.  Rt sided pleural effusion without resp distress: likely due to hepatic hydrothorax vs. distrubutive from poor oncotic pressure.  -No indication to drain.  Code Status: DNR Family Communication: d/w patient, RN  (indicate person spoken with, relationship, and if by phone, the number) Disposition Plan: awaiting SNF   Consultants:  Palliative Care  PCCM  Heart Failure Team  Vascular Surgery  Cardiology  Procedures:  EGD/Epinephrine injection  Mechanical Intubation  Central Venous Catheter  Extubation  Arteriogram of right and left lower extremities  Mechanical thrombectomy of the left popliteal and posterior tibial artery using Penumbra CAT 6 device Mechanical thrombectomy of the right popliteal and posterior tibial artery using Penumbra CAT 6 device  Antibiotics: Anti-infectives (From admission, onward)   Start     Dose/Rate Route Frequency Ordered Stop   08/28/19 2200  rifaximin (XIFAXAN) tablet 550 mg     550 mg Oral 2 times daily 08/28/19 0934     08/26/19 1000  rifaximin (XIFAXAN) tablet 550 mg  Status:  Discontinued     550 mg Per Tube 2 times daily 08/25/19 2113 08/28/19 0935   08/20/19 1800  cefTRIAXone (ROCEPHIN) 2 g in sodium chloride 0.9 % 100 mL IVPB  Status:  Discontinued     2 g 200 mL/hr over 30 Minutes Intravenous Every 24 hours 08/20/19 1600 08/23/19 0825   08/18/19 0700  levofloxacin (LEVAQUIN) tablet 750 mg  Status:  Discontinued     750 mg Oral Every 48 hours 08/17/19 1205 08/20/19 1600   08/16/19 1115  rifaximin (XIFAXAN) tablet 550 mg  Status:  Discontinued     550 mg Oral 2 times daily 08/16/19 1114 08/25/19 2113   08/16/19 0700  levofloxacin (LEVAQUIN) IVPB 750 mg  Status:  Discontinued     750 mg 100 mL/hr over 90 Minutes Intravenous Every 48 hours 08/16/19 0616 08/17/19 1205       (indicate start date, and stop date if known)  HPI/Subjective: Patient seen and examined.  She has no complaints.  Objective: Vitals:   09/08/19 0823 09/08/19 1124  BP: (!) 105/58 Marland Kitchen)  103/58  Pulse: 75 67  Resp: 18 13  Temp: 98.7 F (37.1 C) 98.6 F (37 C)  SpO2: 94% 93%    Intake/Output Summary (Last 24 hours) at 09/08/2019 1451 Last data filed at  09/08/2019 1356 Gross per 24 hour  Intake 700 ml  Output 4900 ml  Net -4200 ml   Filed Weights   09/05/19 0550 09/07/19 0319 09/08/19 0420  Weight: 80.6 kg 72.9 kg 69.8 kg    Exam: General exam: Appears calm and comfortable, obese Respiratory system: Fine crackles at bases bilaterally. Respiratory effort normal. Cardiovascular system: S1 & S2 heard, RRR. No JVD, murmurs, rubs, gallops or clicks.  +1 pitting edema bilateral lower extremity. Gastrointestinal system: Abdomen is nondistended, soft and nontender. No organomegaly or masses felt. Normal bowel sounds heard. Central nervous system: Alert and oriented. No focal neurological deficits. Extremities: Symmetric 5 x 5 power. Skin: No rashes, lesions or ulcers.  Psychiatry: Judgement and insight appear poor, mood & affect appropriate.    Data Reviewed: Basic Metabolic Panel: Recent Labs  Lab 09/02/19 0526  09/04/19 0342 09/05/19 0433 09/06/19 0615 09/07/19 0418 09/08/19 0418  NA 135   < > 134* 134* 136 134* 134*  K 3.8   < > 3.2* 3.8 3.9 3.5 4.1  CL 103   < > 100 100 99 97* 97*  CO2 23   < > 26 27 29 30 29   GLUCOSE 76   < > 87 87 70 74 75  BUN 12   < > 8 6 8 7 10   CREATININE 0.82   < > 0.78 0.77 0.83 0.81 0.99  CALCIUM 7.5*   < > 7.3* 7.3* 7.4* 7.3* 7.5*  MG 1.9  --   --   --   --  1.4* 2.1   < > = values in this interval not displayed.   Liver Function Tests: Recent Labs  Lab 09/02/19 0526 09/03/19 0503 09/05/19 1010  AST 41 42* 36  ALT 33 35 33  ALKPHOS 111 113 114  BILITOT 3.4* 3.3* 3.2*  PROT 4.9* 5.3* 5.4*  ALBUMIN 1.3* 1.3* 1.2*   No results for input(s): LIPASE, AMYLASE in the last 168 hours. Recent Labs  Lab 09/05/19 1010  AMMONIA 15   CBC: Recent Labs  Lab 09/02/19 0526  09/05/19 1010 09/05/19 2048  09/06/19 0305 09/06/19 0615 09/06/19 1607 09/07/19 0418 09/08/19 0418  WBC 6.9   < > 5.2 6.2  --  5.6  --   --  5.9 6.1  NEUTROABS 4.7  --   --   --   --   --   --   --   --   --   HGB  9.8*   < > 9.2* 8.7*   < > 8.4* 8.4* 8.5* 8.3* 8.4*  HCT 29.0*   < > 27.8* 25.9*   < > 25.0* 25.9* 25.0* 24.8* 25.3*  MCV 94.8   < > 97.2 96.6  --  96.5  --   --  95.4 96.2  PLT 231   < > 304 272  --  281  --   --  312 381   < > = values in this interval not displayed.   Cardiac Enzymes: No results for input(s): CKTOTAL, CKMB, CKMBINDEX, TROPONINI in the last 168 hours. BNP (last 3 results) No results for input(s): BNP in the last 8760 hours.  ProBNP (last 3 results) No results for input(s): PROBNP in the last 8760 hours.  CBG: No  results for input(s): GLUCAP in the last 168 hours.  No results found for this or any previous visit (from the past 240 hour(s)).   Studies: No results found.  Scheduled Meds: . carvedilol  3.125 mg Oral BID WC  . Chlorhexidine Gluconate Cloth  6 each Topical Daily  . digoxin  0.125 mg Oral Daily  . feeding supplement  1 Container Oral TID BM  . folic acid  1 mg Oral Daily  . furosemide  40 mg Oral Daily  . gabapentin  200 mg Oral BID  . losartan  25 mg Oral Daily  . mouth rinse  15 mL Mouth Rinse BID  . multivitamin with minerals  1 tablet Per Tube Daily  . nicotine  21 mg Transdermal Daily  . pantoprazole  40 mg Oral BID  . rifaximin  550 mg Oral BID  . rosuvastatin  5 mg Oral q1800  . spironolactone  25 mg Oral QHS  . thiamine  100 mg Oral Daily  . warfarin  1 mg Oral ONCE-1800  . Warfarin - Pharmacist Dosing Inpatient   Does not apply q1800   Continuous Infusions: . sodium chloride 10 mL/hr at 09/02/19 7564    Principal Problem:   Cerebral embolism with cerebral infarction Active Problems:   Abnormal LFTs   Acute metabolic encephalopathy   AKI (acute kidney injury) (HCC)   Fall   Lactic acidosis   Back pain   Liver failure without hepatic coma (HCC)   Multifocal pneumonia   PVD (peripheral vascular disease) (HCC)   Left ventricular apical thrombus without MI (HCC)   Acute CHF (congestive heart failure) (HCC)   Jaundice    Palliative care by specialist   Goals of care, counseling/discussion   Advanced care planning/counseling discussion   Acute upper GI bleed   Pressure injury of skin   GI bleed   Acute on chronic systolic CHF (congestive heart failure) (HCC)   Endotracheally intubated  Time spent 32 minutes  Briana Andrews  [facility] If 7PM-7AM, please contact night-coverage at [email or web address], password [facility] 09/08/2019, 2:51 PM  LOS: 23 days

## 2019-09-08 NOTE — TOC Progression Note (Addendum)
Transition of Care [facility]) - Progression Note    Patient Details  Name: Briana Andrews MRN: [id number] Date of Birth: [birth year]  Transition of Care [facility]) CM/SW [place], [place] Phone Number: 09/08/2019, 1:44 PM  Clinical Narrative:     Patient has no bed offers- ETOH abuse is a barrier for SNF  Placement. CSW called patient's spouse to provide update, left message to return call.  CSW will continue to follow and assist with discharge planning.  Thurmond Butts, MSW, [facility] Clinical Social Worker [phone]   Expected Discharge Plan: Skilled Nursing Facility Barriers to Discharge: SNF Pending bed offer, Continued Medical Work up(ETOH abuse)  Expected Discharge Plan and Services Expected Discharge Plan: [place] arrangements for the past 2 months: Single Family Home                                       Social Determinants of Health (SDOH) Interventions    Readmission Risk Interventions No flowsheet data found.

## 2019-09-08 NOTE — Progress Notes (Addendum)
Patient ID: Briana Andrews, female   DOB: June 10, 1966, 53 y.o.   MRN: [id number]     Advanced Heart Failure Rounding Note  PCP-Cardiologist: Ena Dawley, MD   Subjective:    Much improved.  Sitting up in bed eating lunch. No cardiac complaints. Denies CP and dyspnea.   Required short course of inotropes for low output HF. Dobutamine discontinued. Last Co-ox on 10/10 was 78%. CVC has been removed.   BP stable. Mentating well. Renal function WNL.   LHC/RHC (10/6):  Coronary Findings  Diagnostic Dominance: Right Left Main  Short, no significant disease.  Left Anterior Descending  40% mid LAD stenosis after D2, diffuse moderate luminal irregularities distal LAD.  Ramus Intermedius  Small caliber vessel, appears to be diffusely severely diseased then subtotally occluded in the mid-vessel.  Left Circumflex  50% distal LCx stenosis. Diffuse moderate disease distal OM branches.  Right Coronary Artery  80% proximal RCA stenosis. 60% mid PDA stenosis.  Intervention  No interventions have been documented. Right Heart  Right Heart Pressures RHC Procedural Findings: Hemodynamics (mmHg) RA mean 6 RV 42/4 PA 48/20, mean 29 PCWP mean 7 LV 101/9 AO 101/56  Oxygen saturations: PA 73% AO 100%  Cardiac Output (Fick) 7.07  Cardiac Index (Fick) 3.7 PVR 3.1 WU      Objective:   Weight Range: 69.8 kg Body mass index is 25.61 kg/m.   Vital Signs:   Temp:  [98.2 F (36.8 C)-98.9 F (37.2 C)] 98.6 F (37 C) (10/12 1124) Pulse Rate:  [66-79] 67 (10/12 1124) Resp:  [13-24] 13 (10/12 1124) BP: (88-110)/(40-66) 103/58 (10/12 1124) SpO2:  [90 %-94 %] 93 % (10/12 1124) Weight:  [69.8 kg] 69.8 kg (10/12 0420) Last BM Date: 09/05/19  Weight change: Filed Weights   09/05/19 0550 09/07/19 0319 09/08/19 0420  Weight: 80.6 kg 72.9 kg 69.8 kg    Intake/Output:   Intake/Output Summary (Last 24 hours) at 09/08/2019 1128 Last data filed at 09/08/2019 0946 Gross per 24 hour   Intake 580 ml  Output 3800 ml  Net -3220 ml      Physical Exam    PHYSICAL EXAM: General:  disheveled appearing middle aged WF No respiratory difficulty HEENT: normal Neck: supple. no JVD. Carotids 2+ bilat; no bruits. No lymphadenopathy or thyromegaly appreciated. Cor: PMI nondisplaced. Regular rate & rhythm. No rubs, gallops or murmurs. Lungs: clear Abdomen: soft, nontender, nondistended. No hepatosplenomegaly. No bruits or masses. Good bowel sounds. Extremities: right sided edema, ischemic toes on the right Neuro: alert & oriented x 3, cranial nerves grossly intact. moves all 4 extremities w/o difficulty. Affect pleasant.   Telemetry   NSR in the 70s with occasional sinus bradycardia in the 40s, early morning hours. asymptomatic. Personally reviewed.   Labs    CBC Recent Labs    09/07/19 0418 09/08/19 0418  WBC 5.9 6.1  HGB 8.3* 8.4*  HCT 24.8* 25.3*  MCV 95.4 96.2  PLT 312 643   Basic Metabolic Panel Recent Labs    09/07/19 0418 09/08/19 0418  NA 134* 134*  K 3.5 4.1  CL 97* 97*  CO2 30 29  GLUCOSE 74 75  BUN 7 10  CREATININE 0.81 0.99  CALCIUM 7.3* 7.5*  MG 1.4* 2.1   Liver Function Tests No results for input(s): AST, ALT, ALKPHOS, BILITOT, PROT, ALBUMIN in the last 72 hours. No results for input(s): LIPASE, AMYLASE in the last 72 hours. Cardiac Enzymes No results for input(s): CKTOTAL, CKMB, CKMBINDEX, TROPONINI in the last 72  hours.  BNP: BNP (last 3 results) No results for input(s): BNP in the last 8760 hours.  ProBNP (last 3 results) No results for input(s): PROBNP in the last 8760 hours.   D-Dimer No results for input(s): DDIMER in the last 72 hours. Hemoglobin A1C No results for input(s): HGBA1C in the last 72 hours. Fasting Lipid Panel No results for input(s): CHOL, HDL, LDLCALC, TRIG, CHOLHDL, LDLDIRECT in the last 72 hours. Thyroid Function Tests No results for input(s): TSH, T4TOTAL, T3FREE, THYROIDAB in the last 72 hours.   Invalid input(s): FREET3  Other results:   Imaging    No results found.   Medications:     Scheduled Medications: . carvedilol  3.125 mg Oral BID WC  . Chlorhexidine Gluconate Cloth  6 each Topical Daily  . digoxin  0.125 mg Oral Daily  . feeding supplement  1 Container Oral TID BM  . folic acid  1 mg Oral Daily  . furosemide  40 mg Oral Daily  . gabapentin  200 mg Oral BID  . losartan  25 mg Oral Daily  . mouth rinse  15 mL Mouth Rinse BID  . multivitamin with minerals  1 tablet Per Tube Daily  . nicotine  21 mg Transdermal Daily  . pantoprazole  40 mg Oral BID  . rifaximin  550 mg Oral BID  . rosuvastatin  5 mg Oral q1800  . spironolactone  25 mg Oral QHS  . thiamine  100 mg Oral Daily  . warfarin  1 mg Oral ONCE-1800  . Warfarin - Pharmacist Dosing Inpatient   Does not apply q1800    Infusions: . sodium chloride 10 mL/hr at 09/02/19 0318    PRN Medications: sodium chloride, acetaminophen, albuterol, dextromethorphan-guaiFENesin, hydrALAZINE, ondansetron (ZOFRAN) IV, oxyCODONE, white petrolatum   Assessment/Plan   1. Acute on chronic systolic CHF/cardiogenic shock: Echo with EF 15-20%, mild LV dilation, LV thrombus, mild-moderate MR, severely decreased RV systolic function.  LHC/RHC done 10/6 showing normal filling pressures and preserved cardiac output.  There was diffuse coronary disease, especially involving distal and branch vessels, only possible interventional target was 80% proximal RCA stenosis.  No intervention at this time given good flow down the vessel and unlikely to significantly improve LV function. Suspect that her cardiomyopathy is primarily nonischemic due to ETOH, though coronary disease may play a role.  Initial cardiogenic shock, off dobutamine now.  Currently appears euvolemic. Last Co-ox on 10/10 was 78%. CVC has been removed. Weights have been trending down.  - Continue Lasix 40 mg po daily.  - Continue w/ Unna boots. - Continue  spironolactone to 25 mg daily.   - Continue digoxin 0.125.  - Continue Coreg 3.125 mg bid.  - Continue losartan 25 mg daily.  With SBP in 90s-100s, will not transition to [facility], but please do not hold meds unless SBP < 85.  - Not candidate for advanced therapies with ETOH abuse/liver failure.   2. Liver failure: ETOH hepatitis. Markedly elevated bilirubin initially with jaundice.  However liver imaging with [place] and CT has NOT shown cirrhosis or portal/hepatic vein thrombosis. Question has arisen whether this might possibly be due primarily to RV failure.  Time course seems quite acute for RV failure.  Jaundice/icterus improving and Tbili down to 3.3 when last checked.  3. Encephalopathy: NH3 has not been markedly high but certainly could be hepatic encephalopathy.  Shock likely played a role as well.  More alert this morning.  - She continues on rifaximin, lactulose stopped.  4. AKI: Creatinine improved with inotropic support.  Suspect cardiorenal syndrome, also had contrast with LE angiograms so possible component of contrast nephropathy. Creatinine ultimately normalized.  5. LV thrombus: Possible cause of embolic event to popliteal arteries as well as subacute CVA.  - Continue warfarin. INR therapeutic at 2.5   6. PAD: Bilateral popliteal artery occlusions, suspect cardioembolic from LV thrombus.  Now s/p mechanical thrombectomy by vascular. Right foot still looks ischemic but dopplerable pulses.  - Continue anticoagulation.   7. 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   - Continue anticoagulation.   8. Right popliteal vein DVT - Continue anticoagulation.   9. GI bleeding: Upper GI bleeding, EGD with bleeding duodenal and gastric ulcer on 9/29 that were treated, no varices. She had 1 unit PRBCs on 9/30 and 1 unit on 10/2. Hgb stable past 72 hr at 8.4.  - Would not transfuse unless active bleeding with hgb < 8.  - Continue Protonix.  - Fe studies => likely  would benefit from IV Fe.   10. ETOH abuse: Per husband, at least a 6 pack daily and usually more prior to admission. Plans to quit.   11. ID: Concern for aspiration PNA. - Completed course of ceftriaxone.   12. Vaginal bleeding: Resolved. Was seen by OB/gyn, plan for outpatient followup.    13. Deconditioning:  work with PT.  Wants to go home rather than to CIR.   14. NSVT: Continue  blocker therapy w/ Coreg.   -keep K >4.0 and Mg >2.0   15. CAD: Extensive distal and branch vessel coronary disease on cath 10/6.  Only possible interventional target was 80% proximal RCA stenosis.  However, there is good flow down the RCA and do not think intervention on this lesion would appreciably improve her LV function.  She has not had chest pain.  Would avoid adding antiplatelet therapy with recent GI bleeding.  - In future, once she has recovered from GI bleeding, could intervene on RCA if she has exertional chest pain that does not respond to medical management.  - No ASA with anticoagulation.  - Discussed statin with GI, started on Crestor 5 mg daily.  -needs f/u FLP and HFTs in 6-8 weeks. LDL goal < 70 mg/dL    Length of Stay: [address] PA-C  09/08/2019, 11:28 AM  Patient seen and examined with the above-signed Advanced Practice Provider and/or Housestaff. I personally reviewed laboratory data, imaging studies and relevant notes. I independently examined the patient and formulated the important aspects of the plan. I have edited the note to reflect any of my changes or salient points. I have personally discussed the plan with the patient and/or family.   She remains stable from HF/CAD perspective. BP ok. Volume status looks good. Renal function ok. No CP. Would continue current regimen. The HF team will follow from a distance.   Arvilla Meres, MD  3:33 PM

## 2019-09-08 NOTE — Progress Notes (Signed)
Physical Therapy Treatment Patient Details Name: Briana Andrews MRN: [id number] DOB: February 27, 1966 Today's Date: 09/08/2019    History of Present Illness 53 year old female with history of smoking, alcohol use, she drinks more than 6 packs of beer every day since last 20 years, no chronic medical issues brought to the emergency room with confusion more than usual and falls.  Since last few months there have been noticing some changes on her including weakness on her legs and poor appetite as much as she stopped smoking and decreased her drinking.  2 weeks ago, she was taken to [facility] with a fall, skeletal survey was negative except L1 fracture with 25% height loss and discharged home on muscle relaxants.  Patient has been intermittently confused since then. In the emergency room, she was found with subacute right ACA stroke, jaundice with bilirubin of 12, bilateral multifocal pneumonia and metabolic encephalopathy.s/p cardiac cath 10/6. Echo 10/4- LV thrombus.    PT Comments    Pt was seen for mobility after talking with nursing about footwear and state of R foot skin.  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  Additionally cannot tolerate DF of ankle, and so is unable to wear the cast shoe.  However, she may be able to do more with it as she works on the ROM of R ankle.  Follow pt for goals of therapy including increasing her standing and gait on RW as she can allow, with medication for pain as needed.  Follow Up Recommendations  SNF     Equipment Recommendations  None recommended by PT    Recommendations for Other Services       Precautions / Restrictions Precautions Precautions: Fall Precaution Comments: foley cath, back precautions, R foot skin breakdown Required Braces or Orthoses: Other Brace(R foot ortho shoe) Restrictions Weight Bearing Restrictions: No    Mobility  Bed Mobility Overal bed mobility: Needs  Assistance Bed Mobility: Supine to Sit;Sit to Supine Rolling: Min assist   Supine to sit: Mod assist Sit to supine: Mod assist      Transfers Overall transfer level: Needs assistance Equipment used: Rolling walker (2 wheeled);1 person hand held assist Transfers: Sit to/from Stand Sit to Stand: Mod assist            Ambulation/Gait Ambulation/Gait assistance: Mod assist Gait Distance (Feet): 4 Feet Assistive device: Rolling walker (2 wheeled) Gait Pattern/deviations: Step-to pattern;Decreased step length - right;Decreased step length - left;Decreased stance time - right;Decreased weight shift to right;Trunk flexed;Antalgic Gait velocity: reduced Gait velocity interpretation: <1.31 ft/sec, indicative of household ambulator General Gait Details: pt is in pain whether or not she is standing on her R foot, just painful due to being dependent   Stairs             Wheelchair Mobility    Modified Rankin (Stroke Patients Only)       Balance Overall balance assessment: Needs assistance Sitting-balance support: Feet supported Sitting balance-Leahy Scale: Fair   Postural control: Posterior lean Standing balance support: Bilateral upper extremity supported;During functional activity Standing balance-Leahy Scale: Poor Standing balance comment: cannot balance well due to her lack of WB on R foot                            Cognition Arousal/Alertness: Awake/alert Behavior During Therapy: Flat affect Overall Cognitive Status: No family/caregiver present to determine baseline cognitive functioning Area of Impairment:  Following commands;Safety/judgement;Awareness;Problem solving                   Current Attention Level: Selective Memory: Decreased recall of precautions;Decreased short-term memory Following Commands: Follows one step commands inconsistently Safety/Judgement: Decreased awareness of safety;Decreased awareness of deficits Awareness:  Intellectual Problem Solving: Slow processing;Requires verbal cues General Comments: pt is talking about her issues, but struggling to determine whether her trip to [facility] is done      Exercises General Exercises - Lower Extremity Ankle Circles/Pumps: 5 reps;AAROM Quad Sets: AROM;10 reps Hip ABduction/ADduction: AROM;AAROM;10 reps    General Comments General comments (skin integrity, edema, etc.): declined to stay OOB in chair      Pertinent Vitals/Pain Pain Assessment: Faces Faces Pain Scale: Hurts whole lot Pain Location: RLE Pain Descriptors / Indicators: Grimacing Pain Intervention(s): Limited activity within patient's tolerance;Monitored during session;Premedicated before session;Repositioned    Home Living                      Prior Function            PT Goals (current goals can now be found in the care plan section) Acute Rehab PT Goals Patient Stated Goal: to move without pain Progress towards PT goals: Progressing toward goals    Frequency    Min 3X/week      PT Plan Current plan remains appropriate    Co-evaluation              AM-PAC PT "6 Clicks" Mobility   Outcome Measure  Help needed turning from your back to your side while in a flat bed without using bedrails?: A Little Help needed moving from lying on your back to sitting on the side of a flat bed without using bedrails?: A Little Help needed moving to and from a bed to a chair (including a wheelchair)?: A Lot Help needed standing up from a chair using your arms (e.g., wheelchair or bedside chair)?: A Lot Help needed to walk in hospital room?: A Lot Help needed climbing 3-5 steps with a railing? : Total 6 Click Score: 13    End of Session Equipment Utilized During Treatment: Gait belt;Oxygen Activity Tolerance: Patient limited by fatigue;Treatment limited secondary to medical complications (Comment) Patient left: in bed;with call bell/phone within reach;with bed alarm set Nurse  Communication: Mobility status PT Visit Diagnosis: Other abnormalities of gait and mobility (R26.89);Pain;Muscle weakness (generalized) (M62.81);Difficulty in walking, not elsewhere classified (R26.2) Pain - Right/Left: Right Pain - part of body: Ankle and joints of foot     Time: 1535-1601 PT Time Calculation (min) (ACUTE ONLY): 26 min  Charges:  $Gait Training: 8-22 mins $Therapeutic Exercise: 8-22 mins                   Ramond Dial 09/08/2019, 8:36 PM   Mee Hives, PT MS Acute Rehab Dept. Number: [place]

## 2019-09-09 LAB — BASIC METABOLIC PANEL
Anion gap: 10 (ref 5–15)
BUN: 11 mg/dL (ref 6–20)
CO2: 27 mmol/L (ref 22–32)
Calcium: 7.5 mg/dL — ABNORMAL LOW (ref 8.9–10.3)
Chloride: 96 mmol/L — ABNORMAL LOW (ref 98–111)
Creatinine, Ser: 0.98 mg/dL (ref 0.44–1.00)
GFR calc Af Amer: >60 mL/min (ref >60)
GFR calc non Af Amer: >60 mL/min (ref >60)
Glucose, Bld: 69 mg/dL — ABNORMAL LOW (ref 70–99)
Potassium: 3.8 mmol/L (ref 3.5–5.1)
Sodium: 133 mmol/L — ABNORMAL LOW (ref 135–145)

## 2019-09-09 LAB — CBC
HCT: 25.3 % — ABNORMAL LOW (ref 36.0–46.0)
Hemoglobin: 8.5 g/dL — ABNORMAL LOW (ref 12.0–15.0)
MCH: 32 pg (ref 26.0–34.0)
MCHC: 33.6 g/dL (ref 30.0–36.0)
MCV: 95.1 fL (ref 80.0–100.0)
Platelets: 382 10*3/uL (ref 150–400)
RBC: 2.66 MIL/uL — ABNORMAL LOW (ref 3.87–5.11)
RDW: 18 % — ABNORMAL HIGH (ref 11.5–15.5)
WBC: 6.8 10*3/uL (ref 4.0–10.5)
nRBC: 0 % (ref 0.0–0.2)

## 2019-09-09 LAB — DIGOXIN LEVEL: Digoxin Level: 0.9 ng/mL (ref 0.8–2.0)

## 2019-09-09 LAB — PROTIME-INR
INR: 2.8 — ABNORMAL HIGH (ref 0.8–1.2)
Prothrombin Time: 28.9 seconds — ABNORMAL HIGH (ref 11.4–15.2)

## 2019-09-09 MED ORDER — WARFARIN SODIUM 1 MG PO TABS
1.0000 mg | ORAL_TABLET | Freq: Every day | ORAL | Status: DC
  Administered 2019-09-09: 1 mg via ORAL
  Filled 2019-09-09: qty 1

## 2019-09-09 MED ORDER — ENSURE ENLIVE PO LIQD
237.0000 mL | Freq: Three times a day (TID) | ORAL | Status: DC
  Administered 2019-09-09 – 2019-09-13 (×11): 237 mL via ORAL

## 2019-09-09 MED ORDER — DIGOXIN 125 MCG PO TABS
0.1250 mg | ORAL_TABLET | ORAL | Status: DC
  Administered 2019-09-11 – 2019-09-13 (×2): 0.125 mg via ORAL
  Filled 2019-09-09 (×2): qty 1

## 2019-09-09 NOTE — Progress Notes (Signed)
Nutrition Follow-up  DOCUMENTATION CODES:   Not applicable  INTERVENTION:   D/C Boost Breeze   Add Ensure Enlive po TID, each supplement provides 350 kcal and 20 grams of protein  Continue MVI daily   NUTRITION DIAGNOSIS:   Moderate Malnutrition related to social / environmental circumstances(EtOH abuse) as evidenced by mild fat depletion, moderate fat depletion, mild muscle depletion, moderate muscle depletion.  Ongoing  GOAL:   Patient will meet greater than or equal to 90% of their needs  Not meeting at this time  MONITOR:   Diet advancement, Labs, PO intake, Weight trends, I & O's, Skin  REASON FOR ASSESSMENT:   Low Braden    ASSESSMENT:   53 year old female who presented to the ED on 9/18 with generalized weakness, AMS, and SOB. PMH of tobacco abuse, EtOH abuse. Pt admitted with acute liver failure and multifocal pneumonia. Infarct seen on MRI. CT abdomen showing cardiomegaly, moderate right-sided pleural effusion, and aortic atherosclerosis. Pt now with newly diagnosed biventricular CHF.   9/22 - s/p aortogram, right popliteal artery occlusion treated with thrombectomy 9/23 - left popliteal artery occlusion treated with thrombectomy, transferred to ICU due to hypotension 9/28- intubated for EGD 9/29- EGD reveals bleeding duodenal bulb ulcer, stopped with epi, extubated  10/6- s/p R/L heart cath   Pt reports appetite is slowly progressing. Experiencing taste changes with different food options. Does not like meat options. Meal completions charted as 5-60% for her last eight meals. Discussed the importance of protein intake to promote wound healing. Pt drinking Boost intermittently. Willing to try Ensure for increased protein.   Admission weight: 81.6 kg  Current weight: 70.2 kg   I/O: -14, 089 ml since 9/29 UOP: 1,450 ml x 24 hrs   Medications: folic acid, 40 mg lasix daily, MVI with minerals, aldactone, thiamine Labs: Na 133 (L)    Diet Order:   Diet  Order            Diet 2 gram sodium Room service appropriate? Yes; Fluid consistency: Thin  Diet effective now              EDUCATION NEEDS:   Not appropriate for education at this time  Skin:  Skin Assessment: Skin Integrity Issues: Skin Integrity Issues:: Stage I, Other (Comment) Stage I: left heel, right heel Other: blistered area to right foot, right leg and non-pressure wound to right pretibial  Last BM:  10/12  Height:   Ht Readings from Last 1 Encounters:  08/26/19 5\' 5"  (1.651 m)    Weight:   Wt Readings from Last 1 Encounters:  09/09/19 70.2 kg    Ideal Body Weight:  56.8 kg  BMI:  Body mass index is 25.75 kg/m.  Estimated Nutritional Needs:   Kcal:  1850-2050 kcal  Protein:  90-105 grams  Fluid:  >/= 1.8 L/day   Mariana Single RD, LDN Clinical Nutrition Pager # - [phone]

## 2019-09-09 NOTE — Progress Notes (Signed)
ANTICOAGULATION CONSULT NOTE - Follow Up Consult  Pharmacy Consult for Warfarin Indication: DVT, LV thrombus, and acute CVA  Allergies  Allergen Reactions  . Penicillins Other (See Comments)    Unsure of reaction Did it involve swelling of the face/tongue/throat, SOB, or low BP? Unknown Did it involve sudden or severe rash/hives, skin peeling, or any reaction on the inside of your mouth or nose? Unknown Did you need to seek medical attention at a hospital or doctor's office? Unknown When did it last happen?[place] If all above answers are "NO", may proceed with cephalosporin use.    Patient Measurements: Height: 5\' 5"  (165.1 cm) Weight: 154 lb 12.2 oz (70.2 kg) IBW/kg (Calculated) : 57 Heparin Dosing Weight: 75 kg  Vital Signs: Temp: 98.4 F (36.9 C) (10/13 0808) Temp Source: Oral (10/13 0808) BP: 98/58 (10/13 0808) Pulse Rate: 73 (10/13 0557)  Labs: Recent Labs    09/07/19 0418 09/08/19 0418 09/09/19 0238 09/09/19 0952  HGB 8.3* 8.4* 8.5*  --   HCT 24.8* 25.3* 25.3*  --   PLT 312 381 382  --   LABPROT 20.4* 26.6*  --  28.9*  INR 1.8* 2.5*  --  2.8*  CREATININE 0.81 0.99 0.98  --     Estimated Creatinine Clearance: 64.5 mL/min (by C-G formula based on SCr of 0.98 mg/dL).   Assessment: [name] on heparin for LV thrombus and bilateral lower extremity ischemia w/ right popliteal vein DVT now s/p angioplasty and R/L popliteal thrombectomy of the tibial artery. Heparin therapy complicated by acute CVA and recent GI bleed and need for lower goal. Plans noted for Coumadin when no further procedures   She is s/p mech thrombectomy of L popliteal 9/23. Heparin stopped 09/23 @ 1750 due to a GI bleed. Pt completed course of octreotide. Pt placed on IV PPI for treatment of ulcers. Heparin to continue due to LV thrombus per Dr. Aundra Dubin.  Heparin stopped 10/7.  INR has now trended up to 2.8 after two boosted two days. CBC low but stable, no overt bleeding  noted.  States she will refrain from ETOH after DC  - plan DC to SNF - may eventually need increased warfarin doses but will be conservative intitially as she is sensitive to low doses.   Digoxin level 0.9 10/13 at top of goal will decrease dose to 0.125mg  QOD.   Goal of Therapy:   INR 2-3 Monitor platelets by anticoagulation protocol: Yes   Plan: Warfarin 1mg  daily Daily PT/INR Monitor s/s bleeding  Bonnita Nasuti Pharm.D. CPP, BCPS Clinical Pharmacist [phone] 09/09/2019 12:35 PM    Please check [email or web address] for unit-specific pharmacy phone numbers.

## 2019-09-09 NOTE — Progress Notes (Signed)
Orthopedic Tech Progress Note Patient Details:  Briana Andrews 05-30-1966 [id number]  Ortho Devices Type of Ortho Device: Haematologist Ortho Device/Splint Location: left Ortho Device/Splint Interventions: Application   Post Interventions Patient Tolerated: Well Instructions Provided: Care of device   [name] 09/09/2019, 5:36 PM

## 2019-09-09 NOTE — Progress Notes (Signed)
[facility] PROGRESS NOTE  Briana Andrews Andrews ZDG:[id number] DOB: 03-07-1966 DOA: 08/15/2019 PCP: Patient, [name]  Brief summary   53 yo alcoholic woman admitted with R frontal CVA, B popliteal artery occlusions, hepatic failure, GI bleeding. She was found to have new cardiomyopathy with an LV thrombus, cirrhosis, ARF. Underwent B LE thrombectomies, was treated with heparin, stabilized. Has required hemodynamic support w dobutamine. Received PRBC and was able to undergo EGD 9/29 that showed duodenal and gastric ulcers which were treated endoscopically.He has been weaned off of dobutamine. Heparin has been converted to coumadin. She is now on oral protonix.  On 08/29/2019 the patient developed bleeding from multiple sites including her mouth, urine, and rectum.  Patient's heparin was stopped for 1 hour and then restarted but her dose was reduced.  When it was restarted the patient again had bleeding with continued decline in Hgb. It was again placed on hold on 08/31/2019. It hasnow been converted to coumadin. Transthoracic Echocardiogram repeated on 08/31/2019. Persistent appearance of large left ventricular mural thrombus. Pelvic Ultrasound to investigate complaints of vaginal bleeding while on heparin: It was unable to evaluate the patient's endometrial stripe.  The patient underwent left and right heart catheterization on 09/02/2019. Conclusions from this study were:  1. Optimized filling pressures.  2. Mild pulmonary hypertension.  3. Preserved cardiac output.  4. Extensive CAD, primarily distal/branch vessel disease. The only interventional target would be 80% proximal RCA stenosis. Could consider RCA PCI if she has exertional symptoms despite medical therapy.  Assessment/Plan:  Biventricular failure some combination ischemic event vs stress-induced (from shock state, acidemia, ABLA, ?aspiration-induced sepsis)/acute on chronic systolic CHF.   Echo with 15 to 20% ejection fraction. mild  LV dilation, LV thrombus, mild-moderate MR, severely decreased RV systolic function. LHC/RHC done 10/6 showing normal filling pressures and preserved cardiac output.There was diffuse coronary disease, especially involving distal and branch vessels, only possible interventional target was 80% proximal RCA stenosis. No intervention at this time given good flow down the vessel and unlikely to significantly improve LV function.  Initial cardiogenic shock which responded to dobutamine which has been weaned to off since 09/06/2019. The patient was successfully extubated after EGD and respiratory status is stable on room air.  - Continue Lasix 40 mg po daily.  - Continue w/ Unna boots. - Continue spironolactone to 25 mg daily.   - Continue digoxin 0.125.  - Continue Coreg 3.125 mg bid.  - Continue losartan 25 mg daily.  With SBP in 90s-100s, will not transition to [facility], but please do not hold meds unless SBP < 85.  - Not candidate for advanced therapies with ETOH abuse/liver failure.   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 As per cardiology.  PAD: Bilateral popliteal artery occlusions, suspect cardioembolic from LV thrombus. Now s/p mechanical thrombectomy by vascular. Right foot still looks ischemic but dopplerable pulses.  - Continue anticoagulation, coumadin  Hemorrhage from mouth, urine, and rectum per nursing: resolved expect reporting dark stool.  Heparin has been stopped as the patient is now on coumadin. INR 1.8. No further obvious bleeding, hemoglobin has remained stable over 8.  Transfuse if less than 7. -coumadin per pharm .  INR therapeutic at 2.5 today.  Abnormal uterine bleeding: Stopped following cessation of heparin. Complete pelvic ultrasound wasperformed and was not sufficient to evaluate the patient's endometrial stripe. Dr. Marthann Schiller  discussed the patient with Dr. Earlene Plater who is on call for gynecology. She recommends that the patient follow up with gynecology  as outpatient.  No further inpatient evaluation is necessary.   LV clot on AC: ASA 81 mg daily. The patient is now therapeutic on coumadin. -Monitor CBC.  GI Bleed: The patient presented with melena with suspicion for upper GI source, intermittent transfusion requirements. EGD demonstrated oozing 25mm pre-pyloric ulcer. This was injected with Epi. Hgb 7.5 overnight and received 1 unit PRBC's. Hemoglobin dropped to 7 again on 08/29/2019 after observation of bleeding in mouth, in urine, and in stool.The patient was transfused with 2 units PRBC's. Bleedingappears to have stopped, hemoglobin remains a stable over 8. -f/u daily CBC  Alcoholism, hyperbilirubinemia:  clinically she has cirrhosis although imaging features not c/w this; LFT pattern not c/w alcoholic hepatitis nor ischemic nor congestive.  -WIl continue lactulose, rifaximin, thiamine and folate.  Encephalopathy: resolved.  AKI: Resolved.  CVA: likely secondary to emboli from LV clot. BL femoral artery occlusion s/p BL thrombectomy: RLE does not look great but dopplerable pulses, vascular surgery aware, poor candidate for further procedures, monitor clinically. -on Coumadin  Severe protein calorie malnutrition due to alcoholism:  -Nutrition Consult.  Rt sided pleural effusion without resp distress: likely due to hepatic hydrothorax vs. distrubutive from poor oncotic pressure.  -No indication to drain.  Code Status: DNR Family Communication: d/w patient, RN (indicate person spoken with, relationship, and if by phone, the number) Disposition Plan: awaiting SNF   Consultants:  Palliative Care  PCCM  Heart Failure Team  Vascular Surgery  Cardiology  Procedures:  EGD/Epinephrine injection  Mechanical Intubation  Central Venous Catheter  Extubation  Arteriogram of right and left lower extremities  Mechanical thrombectomy of the left popliteal and posterior tibial artery using Penumbra CAT 6  device Mechanical thrombectomy of the right popliteal and posterior tibial artery using Penumbra CAT 6 device  Antibiotics: Anti-infectives (From admission, onward)   Start     Dose/Rate Route Frequency Ordered Stop   08/28/19 2200  rifaximin (XIFAXAN) tablet 550 mg     550 mg Oral 2 times daily 08/28/19 0934     08/26/19 1000  rifaximin (XIFAXAN) tablet 550 mg  Status:  Discontinued     550 mg Per Tube 2 times daily 08/25/19 2113 08/28/19 0935   08/20/19 1800  cefTRIAXone (ROCEPHIN) 2 g in sodium chloride 0.9 % 100 mL IVPB  Status:  Discontinued     2 g 200 mL/hr over 30 Minutes Intravenous Every 24 hours 08/20/19 1600 08/23/19 0825   08/18/19 0700  levofloxacin (LEVAQUIN) tablet 750 mg  Status:  Discontinued     750 mg Oral Every 48 hours 08/17/19 1205 08/20/19 1600   08/16/19 1115  rifaximin (XIFAXAN) tablet 550 mg  Status:  Discontinued     550 mg Oral 2 times daily 08/16/19 1114 08/25/19 2113   08/16/19 0700  levofloxacin (LEVAQUIN) IVPB 750 mg  Status:  Discontinued     750 mg 100 mL/hr over 90 Minutes Intravenous Every 48 hours 08/16/19 0616 08/17/19 1205       (indicate start date, and stop date if known)  HPI/Subjective: Patient seen and examined.  She is doing fine.  No complaints.  Objective: Vitals:   09/09/19 0808 09/09/19 1252  BP: (!) 98/58 94/71  Pulse:    Resp: 17 18  Temp: 98.4 F (36.9 C) 98.9 F (37.2 C)  SpO2: 92%     Intake/Output Summary (Last 24 hours) at 09/09/2019 1345 Last data filed at 09/09/2019 0812 Gross per 24 hour  Intake 480 ml  Output 1800 ml  Net -1320 ml  Filed Weights   09/07/19 0319 09/08/19 0420 09/09/19 0500  Weight: 72.9 kg 69.8 kg 70.2 kg    Exam:  General exam: Appears calm and comfortable  Respiratory system: Clear to auscultation. Respiratory effort normal. Cardiovascular system: S1 & S2 heard, RRR. No JVD, murmurs, rubs, gallops or clicks. No pedal edema. Gastrointestinal system: Abdomen is nondistended, soft and  nontender. No organomegaly or masses felt. Normal bowel sounds heard. Central nervous system: Alert and oriented. No focal neurological deficits. Extremities: Has dressing in the left lower extremity. Skin: No rashes, lesions or ulcers.  Psychiatry: Judgement and insight appear normal. Mood & affect appropriate.   Data Reviewed: Basic Metabolic Panel: Recent Labs  Lab 09/05/19 0433 09/06/19 0615 09/07/19 0418 09/08/19 0418 09/09/19 0238  NA 134* 136 134* 134* 133*  K 3.8 3.9 3.5 4.1 3.8  CL 100 99 97* 97* 96*  CO2 27 29 30 29 27   GLUCOSE 87 70 74 75 69*  BUN 6 8 7 10 11   CREATININE 0.77 0.83 0.81 0.99 0.98  CALCIUM 7.3* 7.4* 7.3* 7.5* 7.5*  MG  --   --  1.4* 2.1  --    Liver Function Tests: Recent Labs  Lab 09/03/19 0503 09/05/19 1010  AST 42* 36  ALT 35 33  ALKPHOS 113 114  BILITOT 3.3* 3.2*  PROT 5.3* 5.4*  ALBUMIN 1.3* 1.2*   No results for input(s): LIPASE, AMYLASE in the last 168 hours. Recent Labs  Lab 09/05/19 1010  AMMONIA 15   CBC: Recent Labs  Lab 09/05/19 2048  09/06/19 0305 09/06/19 0615 09/06/19 1607 09/07/19 0418 09/08/19 0418 09/09/19 0238  WBC 6.2  --  5.6  --   --  5.9 6.1 6.8  HGB 8.7*   < > 8.4* 8.4* 8.5* 8.3* 8.4* 8.5*  HCT 25.9*   < > 25.0* 25.9* 25.0* 24.8* 25.3* 25.3*  MCV 96.6  --  96.5  --   --  95.4 96.2 95.1  PLT 272  --  281  --   --  312 381 382   < > = values in this interval not displayed.   Cardiac Enzymes: No results for input(s): CKTOTAL, CKMB, CKMBINDEX, TROPONINI in the last 168 hours. BNP (last 3 results) No results for input(s): BNP in the last 8760 hours.  ProBNP (last 3 results) No results for input(s): PROBNP in the last 8760 hours.  CBG: No results for input(s): GLUCAP in the last 168 hours.  No results found for this or any previous visit (from the past 240 hour(s)).   Studies: No results found.  Scheduled Meds: . carvedilol  3.125 mg Oral BID WC  . Chlorhexidine Gluconate Cloth  6 each Topical  Daily  . [START ON 09/11/2019] digoxin  0.125 mg Oral QODAY  . feeding supplement (ENSURE ENLIVE)  237 mL Oral TID BM  . folic acid  1 mg Oral Daily  . furosemide  40 mg Oral Daily  . gabapentin  200 mg Oral BID  . losartan  25 mg Oral Daily  . mouth rinse  15 mL Mouth Rinse BID  . multivitamin with minerals  1 tablet Per Tube Daily  . nicotine  21 mg Transdermal Daily  . pantoprazole  40 mg Oral BID  . rifaximin  550 mg Oral BID  . rosuvastatin  5 mg Oral q1800  . spironolactone  25 mg Oral QHS  . thiamine  100 mg Oral Daily  . warfarin  1 mg Oral q1800  .  Warfarin - Pharmacist Dosing Inpatient   Does not apply q1800   Continuous Infusions: . sodium chloride 10 mL/hr at 09/02/19 [id number]    Principal Problem:   Cerebral embolism with cerebral infarction Active Problems:   Abnormal LFTs   Acute metabolic encephalopathy   AKI (acute kidney injury) (HCC)   Fall   Lactic acidosis   Back pain   Liver failure without hepatic coma (HCC)   Multifocal pneumonia   PVD (peripheral vascular disease) (HCC)   Left ventricular apical thrombus without MI (HCC)   Acute CHF (congestive heart failure) (HCC)   Jaundice   Palliative care by specialist   Goals of care, counseling/discussion   Advanced care planning/counseling discussion   Acute upper GI bleed   Pressure injury of skin   GI bleed   Acute on chronic systolic CHF (congestive heart failure) (HCC)   Endotracheally intubated  Time spent 28 minutes  Salli Bodin  [facility] If 7PM-7AM, please contact night-coverage at [email or web address], password [email or web address] 09/09/2019, 1:45 PM  LOS: 24 days

## 2019-09-09 NOTE — Discharge Instructions (Signed)
Information on my medicine - Coumadin   (Warfarin)  This medication education was reviewed with me or my healthcare representative as part of my discharge preparation.  The pharmacist that spoke with me during my hospital stay was:  Beryle Lathe, [facility]  Why was Coumadin prescribed for you? Coumadin was prescribed for you because you have a blood clot or a medical condition that can cause an increased risk of forming blood clots. Blood clots can cause serious health problems by blocking the flow of blood to the heart, lung, or brain. Coumadin can prevent harmful blood clots from forming. As a reminder your indication for Coumadin is:   Select from menu  What test will check on my response to Coumadin? While on Coumadin (warfarin) you will need to have an INR test regularly to ensure that your dose is keeping you in the desired range. The INR (international normalized ratio) number is calculated from the result of the laboratory test called prothrombin time (PT).  If an INR APPOINTMENT HAS NOT ALREADY BEEN MADE FOR YOU please schedule an appointment to have this lab work done by your health care provider within 7 days. Your INR goal is usually a number between:  2 to 3 or your provider may give you a more narrow range like 2-2.5.  Ask your health care provider during an office visit what your goal INR is.  What  do you need to  know  About  COUMADIN? Take Coumadin (warfarin) exactly as prescribed by your healthcare provider about the same time each day.  DO NOT stop taking without talking to the doctor who prescribed the medication.  Stopping without other blood clot prevention medication to take the place of Coumadin may increase your risk of developing a new clot or stroke.  Get refills before you run out.  What do you do if you miss a dose? If you miss a dose, take it as soon as you remember on the same day then continue your regularly scheduled regimen the next day.  Do not take two doses  of Coumadin at the same time.  Important Safety Information A possible side effect of Coumadin (Warfarin) is an increased risk of bleeding. You should call your healthcare provider right away if you experience any of the following: ? Bleeding from an injury or your nose that does not stop. ? Unusual colored urine (red or dark brown) or unusual colored stools (red or black). ? Unusual bruising for unknown reasons. ? A serious fall or if you hit your head (even if there is no bleeding).  Some foods or medicines interact with Coumadin (warfarin) and might alter your response to warfarin. To help avoid this: ? Eat a balanced diet, maintaining a consistent amount of Vitamin K. ? Notify your provider about major diet changes you plan to make. ? Avoid alcohol or limit your intake to 1 drink for women and 2 drinks for men per day. (1 drink is 5 oz. wine, 12 oz. beer, or 1.5 oz. liquor.)  Make sure that ANY health care provider who prescribes medication for you knows that you are taking Coumadin (warfarin).  Also make sure the healthcare provider who is monitoring your Coumadin knows when you have started a new medication including herbals and non-prescription products.  Coumadin (Warfarin)  Major Drug Interactions  Increased Warfarin Effect Decreased Warfarin Effect  Alcohol (large quantities) Antibiotics (esp. Septra/Bactrim, Flagyl, Cipro) Amiodarone (Cordarone) Aspirin (ASA) Cimetidine (Tagamet) Megestrol (Megace) NSAIDs (ibuprofen, naproxen, etc.) Piroxicam (  Feldene) °Propafenone (Rythmol SR) °Propranolol (Inderal) °Isoniazid (INH) °Posaconazole (Noxafil) Barbiturates (Phenobarbital) °Carbamazepine (Tegretol) °Chlordiazepoxide (Librium) °Cholestyramine (Questran) °Griseofulvin °Oral Contraceptives °Rifampin °Sucralfate (Carafate) °Vitamin K  ° °Coumadin® (Warfarin) Major Herbal Interactions  °Increased Warfarin Effect Decreased Warfarin Effect  °Garlic °Ginseng °Ginkgo biloba Coenzyme  Q10 °Green tea °St. John’s wort   ° °Coumadin® (Warfarin) FOOD Interactions  °Eat a consistent number of servings per week of foods HIGH in Vitamin K °(1 serving = ½ cup)  °Collards (cooked, or boiled & drained) °Kale (cooked, or boiled & drained) °Mustard greens (cooked, or boiled & drained) °Parsley *serving size only = ¼ cup °Spinach (cooked, or boiled & drained) °Swiss chard (cooked, or boiled & drained) °Turnip greens (cooked, or boiled & drained)  °Eat a consistent number of servings per week of foods MEDIUM-HIGH in Vitamin K °(1 serving = 1 cup)  °Asparagus (cooked, or boiled & drained) °Broccoli (cooked, boiled & drained, or raw & chopped) °Brussel sprouts (cooked, or boiled & drained) *serving size only = ½ cup °Lettuce, raw (green leaf, endive, romaine) °Spinach, raw °Turnip greens, raw & chopped  ° °These websites have more information on Coumadin (warfarin):  www.coumadin.com; °www.ahrq.gov/consumer/coumadin.htm; ° ° ° °

## 2019-09-10 DIAGNOSIS — I5023 Acute on chronic systolic (congestive) heart failure: Secondary | ICD-10-CM

## 2019-09-10 LAB — BASIC METABOLIC PANEL
Anion gap: 9 (ref 5–15)
BUN: 12 mg/dL (ref 6–20)
CO2: 28 mmol/L (ref 22–32)
Calcium: 7.4 mg/dL — ABNORMAL LOW (ref 8.9–10.3)
Chloride: 97 mmol/L — ABNORMAL LOW (ref 98–111)
Creatinine, Ser: 0.85 mg/dL (ref 0.44–1.00)
GFR calc Af Amer: >60 mL/min (ref >60)
GFR calc non Af Amer: >60 mL/min (ref >60)
Glucose, Bld: 72 mg/dL (ref 70–99)
Potassium: 3.7 mmol/L (ref 3.5–5.1)
Sodium: 134 mmol/L — ABNORMAL LOW (ref 135–145)

## 2019-09-10 LAB — PROTIME-INR
INR: 2.4 — ABNORMAL HIGH (ref 0.8–1.2)
Prothrombin Time: 25.8 seconds — ABNORMAL HIGH (ref 11.4–15.2)

## 2019-09-10 MED ORDER — LOSARTAN POTASSIUM 25 MG PO TABS
25.0000 mg | ORAL_TABLET | Freq: Two times a day (BID) | ORAL | Status: DC
  Administered 2019-09-10 – 2019-09-13 (×5): 25 mg via ORAL
  Filled 2019-09-10 (×7): qty 1

## 2019-09-10 MED ORDER — WARFARIN SODIUM 2 MG PO TABS
2.0000 mg | ORAL_TABLET | Freq: Once | ORAL | Status: AC
  Administered 2019-09-10: 2 mg via ORAL
  Filled 2019-09-10: qty 1

## 2019-09-10 NOTE — Progress Notes (Signed)
Patient ID: Briana Andrews, female   DOB: 11-03-1966, 53 y.o.   MRN: [id number]     Advanced Heart Failure Rounding Note  PCP-Cardiologist: Tobias Alexander, MD   Subjective:    Awake and alert today, no complaints.  INR therapeutic.  Digoxin level 0.9.    S/p EGD (9/29): Bleeding gastric and duodenal ulcers treated in endoscopy suite with resolution of bleeding. No varices.  She got 1 unit PRBCs 10/2. Hemoglobin 8.3. No overt bleeding.   LHC/RHC (10/6):  Coronary Findings  Diagnostic Dominance: Right Left Main  Short, no significant disease.  Left Anterior Descending  40% mid LAD stenosis after D2, diffuse moderate luminal irregularities distal LAD.  Ramus Intermedius  Small caliber vessel, appears to be diffusely severely diseased then subtotally occluded in the mid-vessel.  Left Circumflex  50% distal LCx stenosis. Diffuse moderate disease distal OM branches.  Right Coronary Artery  80% proximal RCA stenosis. 60% mid PDA stenosis.  Intervention  No interventions have been documented. Right Heart  Right Heart Pressures RHC Procedural Findings: Hemodynamics (mmHg) RA mean 6 RV 42/4 PA 48/20, mean 29 PCWP mean 7 LV 101/9 AO 101/56  Oxygen saturations: PA 73% AO 100%  Cardiac Output (Fick) 7.07  Cardiac Index (Fick) 3.7 PVR 3.1 WU      Objective:   Weight Range: 70.3 kg Body mass index is 25.79 kg/m.   Vital Signs:   Temp:  [98.2 F (36.8 C)-99.3 F (37.4 C)] 98.6 F (37 C) (10/14 0804) Pulse Rate:  [76-78] 76 (10/14 0452) Resp:  [16-20] 20 (10/14 0804) BP: (94-114)/(56-71) 109/64 (10/14 0804) SpO2:  [92 %-95 %] 95 % (10/14 0452) Weight:  [70.3 kg] 70.3 kg (10/14 0452) Last BM Date: 09/08/19  Weight change: Filed Weights   09/08/19 0420 09/09/19 0500 09/10/19 0452  Weight: 69.8 kg 70.2 kg 70.3 kg    Intake/Output:   Intake/Output Summary (Last 24 hours) at 09/10/2019 0833 Last data filed at 09/10/2019 0456 Gross per 24 hour  Intake -   Output 1850 ml  Net -1850 ml      Physical Exam    General: NAD Neck: No JVD, no thyromegaly or thyroid nodule.  Lungs: Clear to auscultation bilaterally with normal respiratory effort. CV: Nondisplaced PMI.  Heart regular S1/S2, no S3/S4, no murmur.  No peripheral edema.  Abdomen: Soft, nontender, no hepatosplenomegaly, no distention.  Skin: Intact without lesions or rashes.  Neurologic: Alert and oriented x 3.  Psych: Normal affect. Extremities: No clubbing or cyanosis.  Lower legs wrapped.  HEENT: Normal.    Telemetry   NSR 60s, personally reviewed.    Labs    CBC Recent Labs    09/08/19 0418 09/09/19 0238  WBC 6.1 6.8  HGB 8.4* 8.5*  HCT 25.3* 25.3*  MCV 96.2 95.1  PLT 381 382   Basic Metabolic Panel Recent Labs    50/72/25 0418 09/09/19 0238 09/10/19 0411  NA 134* 133* 134*  K 4.1 3.8 3.7  CL 97* 96* 97*  CO2 29 27 28   GLUCOSE 75 69* 72  BUN 10 11 12   CREATININE 0.99 0.98 0.85  CALCIUM 7.5* 7.5* 7.4*  MG 2.1  --   --    Liver Function Tests No results for input(s): AST, ALT, ALKPHOS, BILITOT, PROT, ALBUMIN in the last 72 hours. No results for input(s): LIPASE, AMYLASE in the last 72 hours. Cardiac Enzymes No results for input(s): CKTOTAL, CKMB, CKMBINDEX, TROPONINI in the last 72 hours.  BNP: BNP (last 3 results) No  results for input(s): BNP in the last 8760 hours.  ProBNP (last 3 results) No results for input(s): PROBNP in the last 8760 hours.   D-Dimer No results for input(s): DDIMER in the last 72 hours. Hemoglobin A1C No results for input(s): HGBA1C in the last 72 hours. Fasting Lipid Panel No results for input(s): CHOL, HDL, LDLCALC, TRIG, CHOLHDL, LDLDIRECT in the last 72 hours. Thyroid Function Tests No results for input(s): TSH, T4TOTAL, T3FREE, THYROIDAB in the last 72 hours.  Invalid input(s): FREET3  Other results:   Imaging    No results found.   Medications:     Scheduled Medications: . carvedilol  3.125 mg  Oral BID WC  . Chlorhexidine Gluconate Cloth  6 each Topical Daily  . [START ON 09/11/2019] digoxin  0.125 mg Oral QODAY  . feeding supplement (ENSURE ENLIVE)  237 mL Oral TID BM  . folic acid  1 mg Oral Daily  . furosemide  40 mg Oral Daily  . gabapentin  200 mg Oral BID  . losartan  25 mg Oral BID  . mouth rinse  15 mL Mouth Rinse BID  . multivitamin with minerals  1 tablet Per Tube Daily  . nicotine  21 mg Transdermal Daily  . pantoprazole  40 mg Oral BID  . rifaximin  550 mg Oral BID  . rosuvastatin  5 mg Oral q1800  . spironolactone  25 mg Oral QHS  . thiamine  100 mg Oral Daily  . warfarin  1 mg Oral q1800  . Warfarin - Pharmacist Dosing Inpatient   Does not apply q1800    Infusions: . sodium chloride 10 mL/hr at 09/02/19 0318    PRN Medications: sodium chloride, acetaminophen, albuterol, dextromethorphan-guaiFENesin, hydrALAZINE, ondansetron (ZOFRAN) IV, oxyCODONE, white petrolatum   Assessment/Plan   1. Acute on chronic systolic CHF/cardiogenic shock: Echo with EF 15-20%, mild LV dilation, LV thrombus, mild-moderate MR, severely decreased RV systolic function.  LHC/RHC done 10/6 showing normal filling pressures and preserved cardiac output.  There was diffuse coronary disease, especially involving distal and branch vessels, only possible interventional target was 80% proximal RCA stenosis.  No intervention at this time given good flow down the vessel and unlikely to significantly improve LV function.  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  Initial cardiogenic shock, off dobutamine now.  Currently appears euvolemic.   - Continue Lasix 40 mg po daily.  - Continue spironolactone 25 mg daily.   - Continue digoxin 0.125. Level 0.9 today.  - Continue Coreg 3.125 mg bid.  - Can increase losartan to 25 mg bid.  If tolerates, may be able to transition to [facility] but may not have BP room.  Do not hold meds  unless SBP < 85.  - Not candidate for advanced therapies with ETOH abuse/liver failure.  2. Liver failure: ETOH hepatitis. Markedly elevated bilirubin initially with jaundice.  However liver imaging with [place] and CT has NOT shown cirrhosis or portal/hepatic vein thrombosis. Question has arisen whether this might possibly be due primarily to RV failure.  Time course seems quite acute for RV failure.  Jaundice/icterus improving and Tbili down to 3.3 when last checked. 3. Encephalopathy: NH3 has not been markedly high but certainly could be hepatic encephalopathy.  Shock likely played a role as well.  Clear this morning.   - She continues on rifaximin, lactulose stopped.  4. AKI: Creatinine improved with inotropic support.  Suspect cardiorenal syndrome, also had  contrast with LE angiograms so possible component of contrast nephropathy. Creatinine normalized. 5. LV thrombus: Possible cause of embolic event to popliteal arteries as well as subacute CVA.  - Continue warfarin, INR therapeutic.  6. PAD: Bilateral popliteal artery occlusions, suspect cardioembolic from LV thrombus.  Now s/p mechanical thrombectomy by vascular. Right foot still looks ischemic but dopplerable pulses.  - Continue anticoagulation.  7. 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   - Continue anticoagulation.  8. Right popliteal vein DVT - Continue anticoagulation.  9. GI bleeding: Upper GI bleeding, EGD with bleeding duodenal and gastric ulcer on 9/29 that were treated, no varices. She had 1 unit PRBCs on 9/30 and 1 unit on 10/2. Hgb stable, 8.5 yesterday.    - Would not transfuse unless active bleeding with hgb < 8.  - Continue Protonix.  10. ETOH abuse: Per husband, at least a 6 pack daily and usually more prior to admission. Plans to quit.  11. ID: Concern for aspiration PNA. - Completed course of ceftriaxone.  12. Vaginal bleeding: Resolved. Was seen by OB/gyn, plan for outpatient followup.   13.  Deconditioning:  work with PT.  Waiting for SNF.   14. NSVT: Continue Coreg.   43. CAD: Extensive distal and branch vessel coronary disease on cath 10/6.  Only possible interventional target was 80% proximal RCA stenosis.  However, there is good flow down the RCA and I do not think intervention on this lesion would appreciably improve her LV function.  She has not had chest pain.  Would avoid adding antiplatelet therapy with recent GI bleeding.  - In future, once she has recovered from GI bleeding, could intervene on RCA if she has exertional chest pain that does not respond to medical management.  - No ASA with anticoagulation.  - Discussed statin with GI, started on Crestor 5 mg daily.   Ready for SNF from my standpoint, will schedule cardiology followup.   Length of Stay: 25  Loralie Champagne, MD  09/10/2019, 8:33 AM

## 2019-09-10 NOTE — Progress Notes (Signed)
[facility] PROGRESS NOTE  Jaymeson Bakeman LFY:[id number] DOB: [birth year] DOA: 08/15/2019 PCP: Patient, [name]  Brief summary   53 yo alcoholic woman admitted with R frontal CVA, B popliteal artery occlusions, hepatic failure, GI bleeding. She was found to have new cardiomyopathy with an LV thrombus, cirrhosis, ARF. Underwent B LE thrombectomies, was treated with heparin, stabilized. Has required hemodynamic support w dobutamine. Received PRBC and was able to undergo EGD 9/29 that showed duodenal and gastric ulcers which were treated endoscopically.He has been weaned off of dobutamine. Heparin has been converted to coumadin. She is now on oral protonix.  On 08/29/2019 the patient developed bleeding from multiple sites including her mouth, urine, and rectum.  Patient's heparin was stopped for 1 hour and then restarted but her dose was reduced.  When it was restarted the patient again had bleeding with continued decline in Hgb. It was again placed on hold on 08/31/2019. It hasnow been converted to coumadin. Transthoracic Echocardiogram repeated on 08/31/2019. Persistent appearance of large left ventricular mural thrombus. Pelvic Ultrasound to investigate complaints of vaginal bleeding while on heparin: It was unable to evaluate the patient's endometrial stripe.  The patient underwent left and right heart catheterization on 09/02/2019. Conclusions from this study were:  1. Optimized filling pressures.  2. Mild pulmonary hypertension.  3. Preserved cardiac output.  4. Extensive CAD, primarily distal/branch vessel disease. The only interventional target would be 80% proximal RCA stenosis. Could consider RCA PCI if she has exertional symptoms despite medical therapy.  Assessment/Plan:  Biventricular failure some combination ischemic event vs stress-induced (from shock state, acidemia, ABLA, ?aspiration-induced sepsis)/acute on chronic systolic CHF.   Echo with 15 to 20% ejection fraction. mild  LV dilation, LV thrombus, mild-moderate MR, severely decreased RV systolic function. LHC/RHC done 10/6 showing normal filling pressures and preserved cardiac output.There was diffuse coronary disease, especially involving distal and branch vessels, only possible interventional target was 80% proximal RCA stenosis. No intervention at this time given good flow down the vessel and unlikely to significantly improve LV function.  Initial cardiogenic shock which responded to dobutamine which has been weaned to off since 09/06/2019. The patient was successfully extubated after EGD and respiratory status is stable on room air.  - Continue Lasix 40 mg po daily.  - Continue w/ Unna boots. - Continue spironolactone to 25 mg daily.   - Continue digoxin 0.125.  - Continue Coreg 3.125 mg bid.  - Continue losartan 25 mg daily.  With SBP in 90s-100s, will not transition to [facility], but please do not hold meds unless SBP < 85.  - Not candidate for advanced therapies with ETOH abuse/liver failure.   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 As per cardiology.  PAD: Bilateral popliteal artery occlusions, suspect cardioembolic from LV thrombus. Now s/p mechanical thrombectomy by vascular. Right foot still looks ischemic but dopplerable pulses.  - Continue anticoagulation, coumadin  Hemorrhage from mouth, urine, and rectum per nursing: resolved expect reporting dark stool.  Heparin has been stopped as the patient is now on coumadin. INR 1.8. No further obvious bleeding, hemoglobin has remained stable over 8.  Transfuse if less than 7. -coumadin per pharm .  INR therapeutic at 2.5 today.  Abnormal uterine bleeding: Stopped following cessation of heparin. Complete pelvic ultrasound wasperformed and was not sufficient to evaluate the patient's endometrial stripe. Dr. Marthann Schiller  discussed the patient with Dr. Earlene Plater who is on call for gynecology. She recommends that the patient follow up with gynecology  as outpatient.  No further inpatient evaluation is necessary.   LV clot on AC: ASA 81 mg daily. The patient is now therapeutic on coumadin. -Monitor CBC.  GI Bleed: The patient presented with melena with suspicion for upper GI source, intermittent transfusion requirements. EGD demonstrated oozing 43mm pre-pyloric ulcer. This was injected with Epi. Hgb 7.5 overnight and received 1 unit PRBC's. Hemoglobin dropped to 7 again on 08/29/2019 after observation of bleeding in mouth, in urine, and in stool.The patient was transfused with 2 units PRBC's. Bleedingappears to have stopped, hemoglobin remains a stable over 8. -f/u daily CBC  Alcoholism, hyperbilirubinemia:  clinically she has cirrhosis although imaging features not c/w this; LFT pattern not c/w alcoholic hepatitis nor ischemic nor congestive.  -WIl continue lactulose, rifaximin, thiamine and folate.  Encephalopathy: resolved.  AKI: Resolved.  CVA: likely secondary to emboli from LV clot. BL femoral artery occlusion s/p BL thrombectomy: RLE does not look great but dopplerable pulses, vascular surgery aware, poor candidate for further procedures, monitor clinically. -on Coumadin  Severe protein calorie malnutrition due to alcoholism:  -Nutrition Consult.  Rt sided pleural effusion without resp distress: likely due to hepatic hydrothorax vs. distrubutive from poor oncotic pressure.  -No indication to drain.  Code Status: DNR Family Communication: d/w patient, RN (indicate person spoken with, relationship, and if by phone, the number) Disposition Plan: awaiting SNF   Consultants:  Palliative Care  PCCM  Heart Failure Team  Vascular Surgery  Cardiology  Procedures:  EGD/Epinephrine injection  Mechanical Intubation  Central Venous Catheter  Extubation  Arteriogram of right and left lower extremities  Mechanical thrombectomy of the left popliteal and posterior tibial artery using Penumbra CAT 6  device Mechanical thrombectomy of the right popliteal and posterior tibial artery using Penumbra CAT 6 device  Antibiotics: Anti-infectives (From admission, onward)   Start     Dose/Rate Route Frequency Ordered Stop   08/28/19 2200  rifaximin (XIFAXAN) tablet 550 mg     550 mg Oral 2 times daily 08/28/19 0934     08/26/19 1000  rifaximin (XIFAXAN) tablet 550 mg  Status:  Discontinued     550 mg Per Tube 2 times daily 08/25/19 2113 08/28/19 0935   08/20/19 1800  cefTRIAXone (ROCEPHIN) 2 g in sodium chloride 0.9 % 100 mL IVPB  Status:  Discontinued     2 g 200 mL/hr over 30 Minutes Intravenous Every 24 hours 08/20/19 1600 08/23/19 0825   08/18/19 0700  levofloxacin (LEVAQUIN) tablet 750 mg  Status:  Discontinued     750 mg Oral Every 48 hours 08/17/19 1205 08/20/19 1600   08/16/19 1115  rifaximin (XIFAXAN) tablet 550 mg  Status:  Discontinued     550 mg Oral 2 times daily 08/16/19 1114 08/25/19 2113   08/16/19 0700  levofloxacin (LEVAQUIN) IVPB 750 mg  Status:  Discontinued     750 mg 100 mL/hr over 90 Minutes Intravenous Every 48 hours 08/16/19 0616 08/17/19 1205       (indicate start date, and stop date if known)  HPI/Subjective: Patient seen and examined.  She states that she feels like she has to urinate despite of having Foley catheter in place.  No other complaint.  No shortness of breath.  RN checked bladder scan which was 319.  Her Foley catheter was fixed, was likely kinked.  She felt better afterwards.  Objective: Vitals:   09/10/19 0452 09/10/19 0804  BP: (!) 106/56 109/64  Pulse: 76   Resp: 16 20  Temp: 98.3 F (36.8 C)  98.6 F (37 C)  SpO2: 95%     Intake/Output Summary (Last 24 hours) at 09/10/2019 0826 Last data filed at 09/10/2019 0456 Gross per 24 hour  Intake -  Output 1850 ml  Net -1850 ml   Filed Weights   09/08/19 0420 09/09/19 0500 09/10/19 0452  Weight: 69.8 kg 70.2 kg 70.3 kg    Exam:  General exam: Appears calm and comfortable  Respiratory  system: Clear to auscultation. Respiratory effort normal. Cardiovascular system: S1 & S2 heard, RRR. No JVD, murmurs, rubs, gallops or clicks. No pedal edema. Gastrointestinal system: Abdomen is nondistended, soft and nontender. No organomegaly or masses felt. Normal bowel sounds heard. Central nervous system: Alert and oriented. No focal neurological deficits. Extremities: Symmetric 5 x 5 power. Skin: No rashes, lesions or ulcers.  Psychiatry: Judgement and insight appear normal. Mood & affect appropriate.   Data Reviewed: Basic Metabolic Panel: Recent Labs  Lab 09/06/19 0615 09/07/19 0418 09/08/19 0418 09/09/19 0238 09/10/19 0411  NA 136 134* 134* 133* 134*  K 3.9 3.5 4.1 3.8 3.7  CL 99 97* 97* 96* 97*  CO2 29 30 29 27 28   GLUCOSE 70 74 75 69* 72  BUN 8 7 10 11 12   CREATININE 0.83 0.81 0.99 0.98 0.85  CALCIUM 7.4* 7.3* 7.5* 7.5* 7.4*  MG  --  1.4* 2.1  --   --    Liver Function Tests: Recent Labs  Lab 09/05/19 1010  AST 36  ALT 33  ALKPHOS 114  BILITOT 3.2*  PROT 5.4*  ALBUMIN 1.2*   No results for input(s): LIPASE, AMYLASE in the last 168 hours. Recent Labs  Lab 09/05/19 1010  AMMONIA 15   CBC: Recent Labs  Lab 09/05/19 2048  09/06/19 0305 09/06/19 0615 09/06/19 1607 09/07/19 0418 09/08/19 0418 09/09/19 0238  WBC 6.2  --  5.6  --   --  5.9 6.1 6.8  HGB 8.7*   < > 8.4* 8.4* 8.5* 8.3* 8.4* 8.5*  HCT 25.9*   < > 25.0* 25.9* 25.0* 24.8* 25.3* 25.3*  MCV 96.6  --  96.5  --   --  95.4 96.2 95.1  PLT 272  --  281  --   --  312 381 382   < > = values in this interval not displayed.   Cardiac Enzymes: No results for input(s): CKTOTAL, CKMB, CKMBINDEX, TROPONINI in the last 168 hours. BNP (last 3 results) No results for input(s): BNP in the last 8760 hours.  ProBNP (last 3 results) No results for input(s): PROBNP in the last 8760 hours.  CBG: No results for input(s): GLUCAP in the last 168 hours.  No results found for this or any previous visit (from  the past 240 hour(s)).   Studies: No results found.  Scheduled Meds: . carvedilol  3.125 mg Oral BID WC  . Chlorhexidine Gluconate Cloth  6 each Topical Daily  . [START ON 09/11/2019] digoxin  0.125 mg Oral QODAY  . feeding supplement (ENSURE ENLIVE)  237 mL Oral TID BM  . folic acid  1 mg Oral Daily  . furosemide  40 mg Oral Daily  . gabapentin  200 mg Oral BID  . losartan  25 mg Oral Daily  . mouth rinse  15 mL Mouth Rinse BID  . multivitamin with minerals  1 tablet Per Tube Daily  . nicotine  21 mg Transdermal Daily  . pantoprazole  40 mg Oral BID  . rifaximin  550 mg Oral BID  .  rosuvastatin  5 mg Oral q1800  . spironolactone  25 mg Oral QHS  . thiamine  100 mg Oral Daily  . warfarin  1 mg Oral q1800  . Warfarin - Pharmacist Dosing Inpatient   Does not apply q1800   Continuous Infusions: . 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

## 2019-09-10 NOTE — Progress Notes (Signed)
  Speech Language Pathology Treatment: Cognitive-Linquistic  Patient Details Name: Briana Andrews MRN: [id number] DOB: 01-Dec-1965 Today's Date: 09/10/2019 Time: 5208-0223 SLP Time Calculation (min) (ACUTE ONLY): 25 min  Assessment / Plan / Recommendation Clinical Impression  Pt seen at bedside for skilled ST intervention focused on goals for improved attention and problem solving. Pt was resting in recliner upon arrival of SLP. Pt oriented x4, reports significant pain in the right foot. Pt was noted to fall asleep quickly after answering a question. Pt asked to get back in bed because she was so tired. SLP asked how pt notified nursing of needs, and pt independently located and activated call bell. Pt was confused when the nurse's station responded at the same time her phone started ringing. She required multimodal cues to put down the call bell and hold the phone properly to answer it and talk to the person on the other end. Later in the session, pt reported she was not aware that the call bell and telephone were linked. SLP provided education again regarding what had occurred earlier.  Recommend assessment of higher level cognition next session. 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 Pt is hopeful for discharge soon. Continued ST intervention at next venue is recommended.   HPI HPI: 53 year old female with history of smoking, alcohol use, she drinks more than 6 packs of beer every day since last 20 years, no chronic medical issues brought to the emergency room with confusion more than usual and falls.  Since last few months there have been noticing some changes on her including weakness on her legs and poor appetite as much as she stopped smoking and decreased her drinking.  2 weeks ago, she was taken to [facility] with a fall, skeletal survey was negative except L1 fracture with 25% height loss and discharged home  on muscle relaxants.  Patient has been intermittently confused since then. In the emergency room, she was found with subacute right ACA stroke, jaundice with bilirubin of 12, bilateral multifocal pneumonia and metabolic encephalopathy. Intubated 9/28-9/29. GIB resolving      SLP Plan  Continue with current plan of care       Recommendations  Continue ST at next level of care.                Oral Care Recommendations: Oral care BID SLP Visit Diagnosis: Attention and concentration deficit Attention and concentration deficit following: Cerebral infarction Plan: Continue with current plan of care       GO           Briana Andrews, [facility], [place] Speech Language Pathologist Office: [phone] Pager: [phone]  Shonna Chock 09/10/2019, 3:37 PM

## 2019-09-10 NOTE — Progress Notes (Signed)
ANTICOAGULATION CONSULT NOTE - Follow Up Consult  Pharmacy Consult for Warfarin Indication: DVT, LV thrombus, and acute CVA  Allergies  Allergen Reactions  . Penicillins Other (See Comments)    Unsure of reaction Did it involve swelling of the face/tongue/throat, SOB, or low BP? Unknown Did it involve sudden or severe rash/hives, skin peeling, or any reaction on the inside of your mouth or nose? Unknown Did you need to seek medical attention at a hospital or doctor's office? Unknown When did it last happen?[place] If all above answers are "NO", may proceed with cephalosporin use.    Patient Measurements: Height: 5\' 5"  (165.1 cm) Weight: 154 lb 15.7 oz (70.3 kg) IBW/kg (Calculated) : 57 Heparin Dosing Weight: 75 kg  Vital Signs: Temp: 98.6 F (37 C) (10/14 0804) Temp Source: Oral (10/14 0804) BP: 109/64 (10/14 0804) Pulse Rate: 76 (10/14 0452)  Labs: Recent Labs    09/08/19 0418 09/09/19 0238 09/09/19 0952 09/10/19 0411  HGB 8.4* 8.5*  --   --   HCT 25.3* 25.3*  --   --   PLT 381 382  --   --   LABPROT 26.6*  --  28.9* 25.8*  INR 2.5*  --  2.8* 2.4*  CREATININE 0.99 0.98  --  0.85    Estimated Creatinine Clearance: 74.4 mL/min (by C-G formula based on SCr of 0.85 mg/dL).   Assessment: [name] on heparin for LV thrombus and bilateral lower extremity ischemia w/ right popliteal vein DVT now s/p angioplasty and R/L popliteal thrombectomy of the tibial artery. Heparin therapy complicated by acute CVA and recent GI bleed and need for lower goal. Plans noted for Coumadin when no further procedures   She is s/p mech thrombectomy of L popliteal 9/23. Heparin stopped 09/23 @ 1750 due to a GI bleed. Pt completed course of octreotide. Pt placed on IV PPI for treatment of ulcers. Heparin to continue due to LV thrombus per Dr. Aundra Dubin.  Heparin stopped 10/7.  INR is trended down from 2.8 to 2.4. CBC low but stable, no overt bleeding noted on last check 10/13.  States  she will refrain from ETOH after DC  - plan D/C to SNF - may eventually need increased warfarin doses but will be conservative intitially as she is sensitive to low doses.   Goal of Therapy:   INR 2-3 Monitor platelets by anticoagulation protocol: Yes   Plan: Warfarin 2 mg daily Daily PT/INR Monitor s/s bleeding  Antonietta Jewel, PharmD, BCCCP Clinical Pharmacist  Phone: [phone]  Please check AMION for all [facility] phone numbers After 10:00 PM, call [place] [phone] 09/10/2019 9:58 AM    Please check [email or web address] for unit-specific pharmacy phone numbers.

## 2019-09-10 NOTE — Progress Notes (Signed)
Physical Therapy Treatment Patient Details Name: Briana Andrews MRN: [id number] DOB: 01-13-1966 Today's Date: 09/10/2019    History of Present Illness 53 year old female with history of smoking, alcohol use, she drinks more than 6 packs of beer every day since last 20 years, no chronic medical issues brought to the emergency room with confusion more than usual and falls.  Since last few months there have been noticing some changes on her including weakness on her legs and poor appetite as much as she stopped smoking and decreased her drinking.  2 weeks ago, she was taken to [facility] with a fall, skeletal survey was negative except L1 fracture with 25% height loss and discharged home on muscle relaxants.  Patient has been intermittently confused since then. In the emergency room, she was found with subacute right ACA stroke, jaundice with bilirubin of 12, bilateral multifocal pneumonia and metabolic encephalopathy.s/p cardiac cath 10/6. Echo 10/4- LV thrombus.    PT Comments    Patient seen for mobility progression. Pt is making progress toward PT goals and tolerated gait training distance of 30 ft. Continue to progress as tolerated.    Follow Up Recommendations  SNF     Equipment Recommendations  None recommended by PT    Recommendations for Other Services       Precautions / Restrictions Precautions Precautions: Fall Required Braces or Orthoses: Other Brace(R foot ortho shoe) Restrictions Weight Bearing Restrictions: No    Mobility  Bed Mobility Overal bed mobility: Needs Assistance Bed Mobility: Supine to Sit     Supine to sit: Min guard     General bed mobility comments: use of rail  Transfers Overall transfer level: Needs assistance Equipment used: Rolling walker (2 wheeled);1 person hand held assist Transfers: Sit to/from Stand Sit to Stand: Min assist;From elevated surface         General transfer comment: assist to power up into standing;  cues for safe hand placement  Ambulation/Gait Ambulation/Gait assistance: Min guard Gait Distance (Feet): 30 Feet Assistive device: Rolling walker (2 wheeled) Gait Pattern/deviations: Step-through pattern;Decreased step length - right;Decreased stance time - left;Decreased weight shift to left;Antalgic Gait velocity: decreased   General Gait Details: cues for safe use of AD   Stairs             Wheelchair Mobility    Modified Rankin (Stroke Patients Only) Modified Rankin (Stroke Patients Only) Pre-Morbid Rankin Score: No symptoms Modified Rankin: Moderately severe disability     Balance Overall balance assessment: Needs assistance Sitting-balance support: Feet supported Sitting balance-Leahy Scale: Good     Standing balance support: Bilateral upper extremity supported;During functional activity Standing balance-Leahy Scale: Poor                              Cognition Arousal/Alertness: Awake/alert Behavior During Therapy: WFL for tasks assessed/performed Overall Cognitive Status: Within Functional Limits for tasks assessed                                        Exercises      General Comments        Pertinent Vitals/Pain Pain Assessment: Faces Faces Pain Scale: Hurts little more Pain Location: R foot Pain Descriptors / Indicators: Grimacing Pain Intervention(s): Limited activity within patient's tolerance;Monitored during session;Repositioned    Home Living  Prior Function            PT Goals (current goals can now be found in the care plan section) Acute Rehab PT Goals Patient Stated Goal: to move without pain Progress towards PT goals: Progressing toward goals    Frequency    Min 3X/week      PT Plan Current plan remains appropriate    Co-evaluation              AM-PAC PT "6 Clicks" Mobility   Outcome Measure  Help needed turning from your back to your side while in a  flat bed without using bedrails?: A Little Help needed moving from lying on your back to sitting on the side of a flat bed without using bedrails?: A Little Help needed moving to and from a bed to a chair (including a wheelchair)?: A Little Help needed standing up from a chair using your arms (e.g., wheelchair or bedside chair)?: A Little Help needed to walk in hospital room?: A Little Help needed climbing 3-5 steps with a railing? : Total 6 Click Score: 16    End of Session Equipment Utilized During Treatment: Gait belt Activity Tolerance: Patient tolerated treatment well Patient left: with call bell/phone within reach;in chair;with chair alarm set Nurse Communication: Mobility status PT Visit Diagnosis: Other abnormalities of gait and mobility (R26.89);Pain;Muscle weakness (generalized) (M62.81);Difficulty in walking, not elsewhere classified (R26.2) Pain - Right/Left: Right Pain - part of body: Ankle and joints of foot     Time: 1132-1159 PT Time Calculation (min) (ACUTE ONLY): 27 min  Charges:  $Gait Training: 23-37 mins                     Erline Levine, PTA Acute Rehabilitation Services Pager: [phone] Office: [phone]     Carolynne Edouard 09/10/2019, 1:31 PM

## 2019-09-11 LAB — BASIC METABOLIC PANEL
Anion gap: 11 (ref 5–15)
BUN: 13 mg/dL (ref 6–20)
CO2: 26 mmol/L (ref 22–32)
Calcium: 7.3 mg/dL — ABNORMAL LOW (ref 8.9–10.3)
Chloride: 93 mmol/L — ABNORMAL LOW (ref 98–111)
Creatinine, Ser: 0.92 mg/dL (ref 0.44–1.00)
GFR calc Af Amer: >60 mL/min (ref >60)
GFR calc non Af Amer: >60 mL/min (ref >60)
Glucose, Bld: 89 mg/dL (ref 70–99)
Potassium: 3.8 mmol/L (ref 3.5–5.1)
Sodium: 130 mmol/L — ABNORMAL LOW (ref 135–145)

## 2019-09-11 LAB — PROTIME-INR
INR: 1.9 — ABNORMAL HIGH (ref 0.8–1.2)
Prothrombin Time: 21.4 seconds — ABNORMAL HIGH (ref 11.4–15.2)

## 2019-09-11 LAB — DIGOXIN LEVEL: Digoxin Level: 0.6 ng/mL — ABNORMAL LOW (ref 0.8–2.0)

## 2019-09-11 MED ORDER — WARFARIN SODIUM 3 MG PO TABS
3.0000 mg | ORAL_TABLET | Freq: Once | ORAL | Status: AC
  Administered 2019-09-11: 3 mg via ORAL
  Filled 2019-09-11: qty 1

## 2019-09-11 NOTE — TOC Progression Note (Signed)
Transition of Care [facility]) - Progression Note    Patient Details  Name: Calianna Kim MRN: [id number] Date of Birth: May 06, 1966  Transition of Care [facility]) CM/SW [place], [place] Phone Number: 09/11/2019, 12:26 PM  Clinical Narrative:     Patient's spouse accepted bed offer at [facility]. SNF will start insurance authorization today.   Patient updated.  MD updated and Covid requested.  Thurmond Butts, MSW, [facility] Clinical Social Worker [phone]      Expected Discharge Plan: [place] Barriers to Discharge: SNF Pending bed offer, Barriers Unresolved (comment)(hx ETOH- no bed offers)  Expected Discharge Plan and Services Expected Discharge Plan: [place] arrangements for the past 2 months: Single Family Home                                       Social Determinants of Health (SDOH) Interventions    Readmission Risk Interventions No flowsheet data found.

## 2019-09-11 NOTE — TOC Progression Note (Signed)
Transition of Care [facility]) - Progression Note    Patient Details  Name: Briana Andrews MRN: [id number] Date of Birth: [birth year]  Transition of Care [facility]) CM/SW [place], [place] Phone Number: 09/11/2019, 4:37 PM  Clinical Narrative:     Received insurance authorization-  Covid test results pending  Thurmond Butts, MSW, [facility] Clinical Social Worker [phone]   Expected Discharge Plan: [place] Barriers to Discharge: SNF Pending bed offer, Barriers Unresolved (comment)(hx ETOH- no bed offers)  Expected Discharge Plan and Services Expected Discharge Plan: [place] arrangements for the past 2 months: Single Family Home                                       Social Determinants of Health (SDOH) Interventions    Readmission Risk Interventions No flowsheet data found.

## 2019-09-11 NOTE — Progress Notes (Signed)
  Speech Language Pathology Treatment: Cognitive-Linquistic  Patient Details Name: Briana Andrews MRN: [id number] DOB: [birth year] Today's Date: 09/11/2019 Time: 5329-9242 SLP Time Calculation (min) (ACUTE ONLY): 20 min  Assessment / Plan / Recommendation Clinical Impression  Pt seen at bedside for continued cognitive treatment. Pt more awake today, but highly distractible. Pt correct for month and year, but was uncertain of current location. Trail making task was attempted first. Pt required verbal and tactile cues and multiple repetitions of instructions, and was still unable to comprehend the task. That activity was discontinued, and pt was asked to draw a clock. Pt had significant difficulty with this task as well. She wrote 12, 3, 6, and 9 in the correct locations, but then wrote numbers 1-12 on the right hand side. Pt indicated she wanted to start over. SLP drew a circle on a clean piece of paper and returned it to pt. She required multimodal cues to attend to task and begin writing numbers. Cues required to bring her back to task. Pt said, "Oh, sorry. I was trying to remember if I ordered dinner." Pt's phone rang during this session, and pt picked up the call bell. She pressed the red nurse button, and put it up to her ear to answer it. I provided the phone for her to answer correctly. Session was terminated at this point, as pt stated "it's my sister, man. See you later."   HPI HPI: 53 year old female with history of smoking, alcohol use, she drinks more than 6 packs of beer every day since last 20 years, no chronic medical issues brought to the emergency room with confusion more than usual and falls.  Since last few months there have been noticing some changes on her including weakness on her legs and poor appetite as much as she stopped smoking and decreased her drinking.  2 weeks ago, she was taken to [facility] with a fall, skeletal survey was negative except L1 fracture with  25% height loss and discharged home on muscle relaxants.  Patient has been intermittently confused since then. In the emergency room, she was found with subacute right ACA stroke, jaundice with bilirubin of 12, bilateral multifocal pneumonia and metabolic encephalopathy. Intubated 9/28-9/29. GIB resolving      SLP Plan  Continue with current plan of care       Recommendations  24 hour supervision at DC                Oral Care Recommendations: Oral care BID Follow up Recommendations: 24 hour supervision/assistance SLP Visit Diagnosis: Attention and concentration deficit Attention and concentration deficit following: Cerebral infarction Plan: Continue with current plan of care       GO               Briana Andrews B. Briana Andrews, Briana Andrews, Briana Andrews Speech Language Pathologist Office: [phone] Pager: [phone]  Briana Andrews 09/11/2019, 3:57 PM

## 2019-09-11 NOTE — Plan of Care (Signed)
  Problem: Coping: Goal: Will verbalize positive feelings about self Outcome: Progressing   

## 2019-09-11 NOTE — TOC Progression Note (Signed)
Transition of Care [facility]) - Progression Note    Patient Details  Name: Briana Andrews MRN: [id number] Date of Birth: 1966/06/28  Transition of Care [facility]) CM/SW [place], [place] Phone Number: 09/11/2019, 10:28 AM  Clinical Narrative:     Patient has no bed offers  CSW visit with the patient to provide update on bed offers.  Patient expressed she preferred to go home but was willing to go to SNF for ST rehab. CSW informed ETOH is a possible barrier to SNF placement.  CSW was unable to reach spouse by phone.  Patient states she has two sons in the home with her during the day.   CSW called [facility] - left voice message to return call and [facility] - spoke with admission coordinator- they will call CSW back   CSW will continue to follow and assist with discharge planning.   Thurmond Butts, MSW, [facility] Clinical Social Worker [phone]    Expected Discharge Plan: [place] Barriers to Discharge: SNF Pending bed offer, Barriers Unresolved (comment)(hx ETOH- no bed offers)  Expected Discharge Plan and Services Expected Discharge Plan: [place] arrangements for the past 2 months: Single Family Home                                       Social Determinants of Health (SDOH) Interventions    Readmission Risk Interventions No flowsheet data found.

## 2019-09-11 NOTE — Progress Notes (Signed)
[facility] PROGRESS NOTE  Briana Andrews TKK:[id number] DOB: 04/16/1966 DOA: 08/15/2019 PCP: Patient, [name]  Brief summary   53 yo alcoholic woman admitted with R frontal CVA, B popliteal artery occlusions, hepatic failure, GI bleeding. She was found to have new cardiomyopathy with an LV thrombus, cirrhosis, ARF. Underwent B LE thrombectomies, was treated with heparin, stabilized. Has required hemodynamic support w dobutamine. Received PRBC and was able to undergo EGD 9/29 that showed duodenal and gastric ulcers which were treated endoscopically.He has been weaned off of dobutamine. Heparin has been converted to coumadin. She is now on oral protonix.  On 08/29/2019 the patient developed bleeding from multiple sites including her mouth, urine, and rectum.  Patient's heparin was stopped for 1 hour and then restarted but her dose was reduced.  When it was restarted the patient again had bleeding with continued decline in Hgb. It was again placed on hold on 08/31/2019. It hasnow been converted to coumadin. Transthoracic Echocardiogram repeated on 08/31/2019. Persistent appearance of large left ventricular mural thrombus. Pelvic Ultrasound to investigate complaints of vaginal bleeding while on heparin: It was unable to evaluate the patient's endometrial stripe.  The patient underwent left and right heart catheterization on 09/02/2019. Conclusions from this study were:  1. Optimized filling pressures.  2. Mild pulmonary hypertension.  3. Preserved cardiac output.  4. Extensive CAD, primarily distal/branch vessel disease. The only interventional target would be 80% proximal RCA stenosis. Could consider RCA PCI if she has exertional symptoms despite medical therapy.  Assessment/Plan:  Biventricular failure some combination ischemic event vs stress-induced (from shock state, acidemia, ABLA, ?aspiration-induced sepsis)/acute on chronic systolic CHF.   Echo with 15 to 20% ejection fraction. mild  LV dilation, LV thrombus, mild-moderate MR, severely decreased RV systolic function. LHC/RHC done 10/6 showing normal filling pressures and preserved cardiac output.There was diffuse coronary disease, especially involving distal and branch vessels, only possible interventional target was 80% proximal RCA stenosis. No intervention at this time given good flow down the vessel and unlikely to significantly improve LV function.  Initial cardiogenic shock which responded to dobutamine which has been weaned to off since 09/06/2019. The patient was successfully extubated after EGD and respiratory status is stable on room air.  - Continue Lasix 40 mg po daily.  - Continue w/ Unna boots. - Continue spironolactone to 25 mg daily.   - Continue digoxin 0.125.  - Continue Coreg 3.125 mg bid.  - Continue losartan 25 mg daily.  With SBP in 90s-100s, will not transition to [facility], but please do not hold meds unless SBP < 85.  - Not candidate for advanced therapies with ETOH abuse/liver failure.   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 As per cardiology.  PAD: Bilateral popliteal artery occlusions, suspect cardioembolic from LV thrombus. Now s/p mechanical thrombectomy by vascular. Right foot still looks ischemic but dopplerable pulses.  - Continue anticoagulation, coumadin  Hemorrhage from mouth, urine, and rectum per nursing: resolved expect reporting dark stool.  Heparin has been stopped as the patient is now on coumadin. INR 1.8. No further obvious bleeding, hemoglobin has remained stable over 8.  Transfuse if less than 7. -coumadin per pharm .  INR therapeutic at 2.5 today.  Abnormal uterine bleeding: Stopped following cessation of heparin. Complete pelvic ultrasound wasperformed and was not sufficient to evaluate the patient's endometrial stripe. Dr. Marthann Schiller  discussed the patient with Dr. Earlene Plater who is on call for gynecology. She recommends that the patient follow up with gynecology  as outpatient.  No further inpatient evaluation is necessary.   LV clot on AC: ASA 81 mg daily. The patient is now therapeutic on coumadin. -Monitor CBC.  GI Bleed: The patient presented with melena with suspicion for upper GI source, intermittent transfusion requirements. EGD demonstrated oozing 38mm pre-pyloric ulcer. This was injected with Epi. Hgb 7.5 overnight and received 1 unit PRBC's. Hemoglobin dropped to 7 again on 08/29/2019 after observation of bleeding in mouth, in urine, and in stool.The patient was transfused with 2 units PRBC's. Bleedingappears to have stopped, hemoglobin remains a stable over 8. -f/u daily CBC  Alcoholism, hyperbilirubinemia:  clinically she has cirrhosis although imaging features not c/w this; LFT pattern not c/w alcoholic hepatitis nor ischemic nor congestive.  -WIl continue lactulose, rifaximin, thiamine and folate.  Encephalopathy: resolved.  AKI: Resolved.  CVA: likely secondary to emboli from LV clot. BL femoral artery occlusion s/p BL thrombectomy: RLE does not look great but dopplerable pulses, vascular surgery aware, poor candidate for further procedures, monitor clinically. -on Coumadin  Severe protein calorie malnutrition due to alcoholism:  -Nutrition Consult.  Rt sided pleural effusion without resp distress: likely due to hepatic hydrothorax vs. distrubutive from poor oncotic pressure.  -No indication to drain.  Code Status: DNR Family Communication: d/w patient, RN (indicate person spoken with, relationship, and if by phone, the number) Disposition Plan: Patient has remained stable for several days.  Per case manager, she has a bed at [facility] however she needs insurance authorization COVID-19 test negative.  I have ordered COVID-19 today.   Consultants:  Palliative Care  PCCM  Heart Failure Team  Vascular Surgery  Cardiology  Procedures:  EGD/Epinephrine injection  Mechanical Intubation  Central Venous  Catheter  Extubation  Arteriogram of right and left lower extremities  Mechanical thrombectomy of the left popliteal and posterior tibial artery using Penumbra CAT 6 device Mechanical thrombectomy of the right popliteal and posterior tibial artery using Penumbra CAT 6 device  Antibiotics: Anti-infectives (From admission, onward)   Start     Dose/Rate Route Frequency Ordered Stop   08/28/19 2200  rifaximin (XIFAXAN) tablet 550 mg     550 mg Oral 2 times daily 08/28/19 0934     08/26/19 1000  rifaximin (XIFAXAN) tablet 550 mg  Status:  Discontinued     550 mg Per Tube 2 times daily 08/25/19 2113 08/28/19 0935   08/20/19 1800  cefTRIAXone (ROCEPHIN) 2 g in sodium chloride 0.9 % 100 mL IVPB  Status:  Discontinued     2 g 200 mL/hr over 30 Minutes Intravenous Every 24 hours 08/20/19 1600 08/23/19 0825   08/18/19 0700  levofloxacin (LEVAQUIN) tablet 750 mg  Status:  Discontinued     750 mg Oral Every 48 hours 08/17/19 1205 08/20/19 1600   08/16/19 1115  rifaximin (XIFAXAN) tablet 550 mg  Status:  Discontinued     550 mg Oral 2 times daily 08/16/19 1114 08/25/19 2113   08/16/19 0700  levofloxacin (LEVAQUIN) IVPB 750 mg  Status:  Discontinued     750 mg 100 mL/hr over 90 Minutes Intravenous Every 48 hours 08/16/19 0616 08/17/19 1205       (indicate start date, and stop date if known)  HPI/Subjective: Patient seen and examined.  She remains symptom-free.  Objective: Vitals:   09/11/19 1038 09/11/19 1041  BP:  (!) 83/73  Pulse: 91 90  Resp:    Temp:    SpO2:      Intake/Output Summary (Last 24 hours) at 09/11/2019 1104 Last data filed  at 09/10/2019 2210 Gross per 24 hour  Intake 250 ml  Output 950 ml  Net -700 ml   Filed Weights   09/08/19 0420 09/09/19 0500 09/10/19 0452  Weight: 69.8 kg 70.2 kg 70.3 kg    Exam:  General exam: Appears calm and comfortable  Respiratory system: Clear to auscultation. Respiratory effort normal. Cardiovascular system: S1 & S2 heard,  RRR. No JVD, murmurs, rubs, gallops or clicks. No pedal edema. Gastrointestinal system: Abdomen is nondistended, soft and nontender. No organomegaly or masses felt. Normal bowel sounds heard. Central nervous system: Alert and oriented. No focal neurological deficits. Extremities: Symmetric 5 x 5 power. Skin: No rashes, lesions or ulcers.  Psychiatry: Judgement and insight appear normal. Mood & affect appropriate.     Data Reviewed: Basic Metabolic Panel: Recent Labs  Lab 09/07/19 0418 09/08/19 0418 09/09/19 0238 09/10/19 0411 09/11/19 0307  NA 134* 134* 133* 134* 130*  K 3.5 4.1 3.8 3.7 3.8  CL 97* 97* 96* 97* 93*  CO2 30 29 27 28 26   GLUCOSE 74 75 69* 72 89  BUN 7 10 11 12 13   CREATININE 0.81 0.99 0.98 0.85 0.92  CALCIUM 7.3* 7.5* 7.5* 7.4* 7.3*  MG 1.4* 2.1  --   --   --    Liver Function Tests: Recent Labs  Lab 09/05/19 1010  AST 36  ALT 33  ALKPHOS 114  BILITOT 3.2*  PROT 5.4*  ALBUMIN 1.2*   No results for input(s): LIPASE, AMYLASE in the last 168 hours. Recent Labs  Lab 09/05/19 1010  AMMONIA 15   CBC: Recent Labs  Lab 09/05/19 2048  09/06/19 0305 09/06/19 0615 09/06/19 1607 09/07/19 0418 09/08/19 0418 09/09/19 0238  WBC 6.2  --  5.6  --   --  5.9 6.1 6.8  HGB 8.7*   < > 8.4* 8.4* 8.5* 8.3* 8.4* 8.5*  HCT 25.9*   < > 25.0* 25.9* 25.0* 24.8* 25.3* 25.3*  MCV 96.6  --  96.5  --   --  95.4 96.2 95.1  PLT 272  --  281  --   --  312 381 382   < > = values in this interval not displayed.   Cardiac Enzymes: No results for input(s): CKTOTAL, CKMB, CKMBINDEX, TROPONINI in the last 168 hours. BNP (last 3 results) No results for input(s): BNP in the last 8760 hours.  ProBNP (last 3 results) No results for input(s): PROBNP in the last 8760 hours.  CBG: No results for input(s): GLUCAP in the last 168 hours.  No results found for this or any previous visit (from the past 240 hour(s)).   Studies: No results found.  Scheduled Meds: . carvedilol   3.125 mg Oral BID WC  . Chlorhexidine Gluconate Cloth  6 each Topical Daily  . digoxin  0.125 mg Oral QODAY  . feeding supplement (ENSURE ENLIVE)  237 mL Oral TID BM  . folic acid  1 mg Oral Daily  . furosemide  40 mg Oral Daily  . gabapentin  200 mg Oral BID  . losartan  25 mg Oral BID  . mouth rinse  15 mL Mouth Rinse BID  . multivitamin with minerals  1 tablet Per Tube Daily  . nicotine  21 mg Transdermal Daily  . pantoprazole  40 mg Oral BID  . rifaximin  550 mg Oral BID  . rosuvastatin  5 mg Oral q1800  . spironolactone  25 mg Oral QHS  . thiamine  100 mg Oral Daily  .  warfarin  3 mg Oral ONCE-1800  . Warfarin - Pharmacist Dosing Inpatient   Does not apply q1800   Continuous Infusions: . sodium chloride 10 mL/hr at 09/02/19 5003    Principal Problem:   Cerebral embolism with cerebral infarction Active Problems:   Abnormal LFTs   Acute metabolic encephalopathy   AKI (acute kidney injury) ([place])   Fall   Lactic acidosis   Back pain   Liver failure without hepatic coma (HCC)   Multifocal pneumonia   PVD (peripheral vascular disease) (HCC)   Left ventricular apical thrombus without MI ([place])   Acute CHF (congestive heart failure) ([place])   Jaundice   Palliative care by specialist   Goals of care, counseling/discussion   Advanced care planning/counseling discussion   Acute upper GI bleed   Pressure injury of skin   GI bleed   Acute on chronic systolic CHF (congestive heart failure) ([place])   Endotracheally intubated  Time spent 25 minutes  [place]  [facility] If 7PM-7AM, please contact night-coverage at [email or web address], password [facility] 09/11/2019, 11:04 AM  LOS: 26 days

## 2019-09-11 NOTE — Progress Notes (Signed)
ANTICOAGULATION CONSULT NOTE - Follow Up Consult  Pharmacy Consult for Warfarin Indication: DVT, LV thrombus, and acute CVA  Allergies  Allergen Reactions  . Penicillins Other (See Comments)    Unsure of reaction Did it involve swelling of the face/tongue/throat, SOB, or low BP? Unknown Did it involve sudden or severe rash/hives, skin peeling, or any reaction on the inside of your mouth or nose? Unknown Did you need to seek medical attention at a hospital or doctor's office? Unknown When did it last happen?[place] If all above answers are "NO", may proceed with cephalosporin use.    Patient Measurements: Height: 5\' 5"  (165.1 cm) Weight: 154 lb 15.7 oz (70.3 kg) IBW/kg (Calculated) : 57 Heparin Dosing Weight: 75 kg  Vital Signs: Temp: 98.3 F (36.8 C) (10/15 0411) Temp Source: Oral (10/15 0411) BP: 103/58 (10/15 0411) Pulse Rate: 82 (10/15 0411)  Labs: Recent Labs    09/09/19 0238 09/09/19 0952 09/10/19 0411 09/11/19 0307  HGB 8.5*  --   --   --   HCT 25.3*  --   --   --   PLT 382  --   --   --   LABPROT  --  28.9* 25.8* 21.4*  INR  --  2.8* 2.4* 1.9*  CREATININE 0.98  --  0.85 0.92    Estimated Creatinine Clearance: 68.8 mL/min (by C-G formula based on SCr of 0.92 mg/dL).   Assessment: [name] on heparin for LV thrombus and bilateral lower extremity ischemia w/ right popliteal vein DVT now s/p angioplasty and R/L popliteal thrombectomy of the tibial artery. Heparin therapy complicated by acute CVA and recent GI bleed and need for lower goal. Plans noted for Coumadin when no further procedures   She is s/p mech thrombectomy of L popliteal 9/23. Heparin stopped 09/23 @ 1750 due to a GI bleed. Pt completed course of octreotide. Pt placed on IV PPI for treatment of ulcers. Heparin to continue due to LV thrombus per Dr. Aundra Dubin.  Heparin stopped 10/7.  INR is trended down from 2.8 to now 1.9. CBC low but stable, no overt bleeding noted on last check  10/13.  States she will refrain from ETOH after DC  - plan D/C to SNF - may eventually need increased warfarin doses but will be conservative intitially as she is sensitive to low doses.   Goal of Therapy:   INR 2-3 Monitor platelets by anticoagulation protocol: Yes   Plan: Warfarin 3 mg daily Daily PT/INR Monitor s/s bleeding  Erin Hearing PharmD., BCPS Clinical Pharmacist 09/11/2019 7:36 AM  Please check AMION for all [place] phone numbers After 10:00 PM, call [place] [phone]

## 2019-09-12 LAB — NOVEL CORONAVIRUS, NAA (HOSP ORDER, SEND-OUT TO REF LAB; TAT 18-24 HRS): SARS-CoV-2, NAA: NOT DETECTED

## 2019-09-12 LAB — PROTIME-INR
INR: 1.8 — ABNORMAL HIGH (ref 0.8–1.2)
Prothrombin Time: 20.6 seconds — ABNORMAL HIGH (ref 11.4–15.2)

## 2019-09-12 LAB — BASIC METABOLIC PANEL
Anion gap: 8 (ref 5–15)
BUN: 13 mg/dL (ref 6–20)
CO2: 27 mmol/L (ref 22–32)
Calcium: 7.4 mg/dL — ABNORMAL LOW (ref 8.9–10.3)
Chloride: 96 mmol/L — ABNORMAL LOW (ref 98–111)
Creatinine, Ser: 0.84 mg/dL (ref 0.44–1.00)
GFR calc Af Amer: >60 mL/min (ref >60)
GFR calc non Af Amer: >60 mL/min (ref >60)
Glucose, Bld: 92 mg/dL (ref 70–99)
Potassium: 4 mmol/L (ref 3.5–5.1)
Sodium: 131 mmol/L — ABNORMAL LOW (ref 135–145)

## 2019-09-12 LAB — CBC
HCT: 49.5 % — ABNORMAL HIGH (ref 36.0–46.0)
Hemoglobin: 16.2 g/dL — ABNORMAL HIGH (ref 12.0–15.0)
MCH: 31.2 pg (ref 26.0–34.0)
MCHC: 32.7 g/dL (ref 30.0–36.0)
MCV: 95.2 fL (ref 80.0–100.0)
Platelets: 174 10*3/uL (ref 150–400)
RBC: 5.2 MIL/uL — ABNORMAL HIGH (ref 3.87–5.11)
RDW: 18.5 % — ABNORMAL HIGH (ref 11.5–15.5)
WBC: 5.4 10*3/uL (ref 4.0–10.5)
nRBC: 0 % (ref 0.0–0.2)

## 2019-09-12 MED ORDER — WARFARIN SODIUM 3 MG PO TABS
3.0000 mg | ORAL_TABLET | Freq: Once | ORAL | Status: AC
  Administered 2019-09-12: 3 mg via ORAL
  Filled 2019-09-12: qty 1

## 2019-09-12 NOTE — Progress Notes (Signed)
ANTICOAGULATION CONSULT NOTE - Follow Up Consult  Pharmacy Consult for Warfarin Indication: DVT, LV thrombus, and acute CVA  Allergies  Allergen Reactions  . Penicillins Other (See Comments)    Unsure of reaction Did it involve swelling of the face/tongue/throat, SOB, or low BP? Unknown Did it involve sudden or severe rash/hives, skin peeling, or any reaction on the inside of your mouth or nose? Unknown Did you need to seek medical attention at a hospital or doctor's office? Unknown When did it last happen?[place] If all above answers are "NO", may proceed with cephalosporin use.    Patient Measurements: Height: 5\' 5"  (165.1 cm) Weight: 152 lb 1.9 oz (69 kg) IBW/kg (Calculated) : 57 Heparin Dosing Weight: 75 kg  Vital Signs: Temp: 98.2 F (36.8 C) (10/16 0846) Temp Source: Oral (10/16 0846) BP: 113/74 (10/16 0846) Pulse Rate: 77 (10/16 0846)  Labs: Recent Labs    09/10/19 0411 09/11/19 0307 09/12/19 0154  HGB  --   --  16.2*  HCT  --   --  49.5*  PLT  --   --  174  LABPROT 25.8* 21.4* 20.6*  INR 2.4* 1.9* 1.8*  CREATININE 0.85 0.92 0.84    Estimated Creatinine Clearance: 74.7 mL/min (by C-G formula based on SCr of 0.84 mg/dL).   Assessment: [name] on heparin for LV thrombus and bilateral lower extremity ischemia w/ right popliteal vein DVT now s/p angioplasty and R/L popliteal thrombectomy of the tibial artery. Heparin therapy complicated by acute CVA and recent GI bleed and need for lower goal. Plans noted for Coumadin when no further procedures   She is s/p mech thrombectomy of L popliteal 9/23. Heparin stopped 09/23 @ 1750 due to a GI bleed. Pt completed course of octreotide. Pt placed on IV PPI for treatment of ulcers. Heparin to continue due to LV thrombus per Dr. Aundra Dubin.  Heparin stopped 10/7.  INR is trended down from 2.8 to now 1.8. CBC does not look consistent or accurate, no overt bleeding noted.   States she will refrain from ETOH after DC  -  plan D/C to SNF - may eventually need increased warfarin doses but will be conservative intitially as she is sensitive to low doses.   Goal of Therapy:   INR 2-3 Monitor platelets by anticoagulation protocol: Yes   Plan: Warfarin 3 mg again today Daily PT/INR Monitor s/s bleeding  Erin Hearing PharmD., BCPS Clinical Pharmacist 09/12/2019 11:17 AM  Please check AMION for all [facility] phone numbers After 10:00 PM, call [place] [phone]

## 2019-09-12 NOTE — Progress Notes (Signed)
Occupational Therapy Treatment Patient Details Name: Briana Andrews MRN: [id number] DOB: [birth year] Today's Date: 09/12/2019    History of present illness 53 year old female with history of smoking, alcohol use, she drinks more than 6 packs of beer every day since last 20 years, no chronic medical issues brought to the emergency room with confusion more than usual and falls.  Since last few months there have been noticing some changes on her including weakness on her legs and poor appetite as much as she stopped smoking and decreased her drinking.  2 weeks ago, she was taken to [facility] with a fall, skeletal survey was negative except L1 fracture with 25% height loss and discharged home on muscle relaxants.  Patient has been intermittently confused since then. In the emergency room, she was found with subacute right ACA stroke, jaundice with bilirubin of 12, bilateral multifocal pneumonia and metabolic encephalopathy.s/p cardiac cath 10/6. Echo 10/4- LV thrombus.   OT comments  Pt progressing fairly towards acute OT goals. Bed mobility, sat EOB several minutes and completed grooming task. Pain and fatigue limiting session. D/c plan updated to venue below.    Follow Up Recommendations  SNF;Supervision/Assistance - 24 hour    Equipment Recommendations  Other (comment)(TBD next venue)    Recommendations for Other Services      Precautions / Restrictions Precautions Precautions: Fall Precaution Comments: foley cath, back precautions, R foot skin breakdown Required Braces or Orthoses: (R foot ortho shoe)       Mobility Bed Mobility Overal bed mobility: Needs Assistance Bed Mobility: Supine to Sit;Sit to Supine     Supine to sit: Min guard Sit to supine: Mod assist   General bed mobility comments: assist to powerup BLE onto bed. min guard for safety  Transfers                 General transfer comment: pt with decreased initiation and increased pai nin  seated position. Cued and gave encouragement to stand but pt ultimately not following cue to stand.    Balance Overall balance assessment: Needs assistance Sitting-balance support: Feet supported Sitting balance-Leahy Scale: Good Sitting balance - Comments: supervision for safety                                   ADL either performed or assessed with clinical judgement   ADL Overall ADL's : Needs assistance/impaired Eating/Feeding: Set up;Sitting   Grooming: Oral care;Set up;Sitting                                 General ADL Comments: Pt limited by increased LE pain in seated position. Sat EOB several minutes and completed eating and grooming tasks. Attempted to work on sit<>stand transfer with decreased initiation, increased pain in seated position, internal distraction, and fatigue limiting session     Vision       Perception     Praxis      Cognition Arousal/Alertness: Awake/alert Behavior During Therapy: Flat affect;Anxious Overall Cognitive Status: Impaired/Different from baseline Area of Impairment: Awareness;Problem solving;Safety/judgement;Memory;Attention;Orientation                   Current Attention Level: Sustained Memory: Decreased recall of precautions;Decreased short-term memory   Safety/Judgement: Decreased awareness of safety;Decreased awareness of deficits Awareness: Intellectual Problem Solving: Slow processing;Requires verbal cues;Decreased initiation General Comments: internally distracted. tangential responses.  Exercises     Shoulder Instructions       General Comments      Pertinent Vitals/ Pain       Pain Assessment: Faces Faces Pain Scale: Hurts whole lot Pain Location: R foot in seated position, 4/10 FACES in supine Pain Descriptors / Indicators: Grimacing;Sore;Guarding;Moaning Pain Intervention(s): Monitored during session;Limited activity within patient's tolerance;Repositioned  Home  Living                                          Prior Functioning/Environment              Frequency  Min 2X/week        Progress Toward Goals  OT Goals(current goals can now be found in the care plan section)  Progress towards OT goals: Progressing toward goals  Acute Rehab OT Goals Patient Stated Goal: to move without pain OT Goal Formulation: With patient Time For Goal Achievement: 09/26/19 Potential to Achieve Goals: Good ADL Goals Pt Will Perform Grooming: with min guard assist;standing Pt Will Perform Lower Body Bathing: with min guard assist;with adaptive equipment;sitting/lateral leans;sit to/from stand Pt Will Perform Lower Body Dressing: with min guard assist;sitting/lateral leans;sit to/from stand;with adaptive equipment Pt Will Transfer to Toilet: with min guard assist;stand pivot transfer;bedside commode Pt Will Perform Tub/Shower Transfer: with min assist;shower seat;Stand pivot transfer;rolling walker Additional ADL Goal #1: Pt will demonstrate anticipatory awareness while engaging in BADL/IADL task  Plan Discharge plan needs to be updated    Co-evaluation                 AM-PAC OT "6 Clicks" Daily Activity     Outcome Measure   Help from another person eating meals?: None Help from another person taking care of personal grooming?: A Little Help from another person toileting, which includes using toliet, bedpan, or urinal?: A Lot Help from another person bathing (including washing, rinsing, drying)?: A Lot Help from another person to put on and taking off regular upper body clothing?: A Little Help from another person to put on and taking off regular lower body clothing?: A Lot 6 Click Score: 16    End of Session Equipment Utilized During Treatment: (maxi move)  OT Visit Diagnosis: Unsteadiness on feet (R26.81);Muscle weakness (generalized) (M62.81);Other symptoms and signs involving cognitive function;Pain   Activity  Tolerance Patient limited by pain;Patient limited by fatigue   Patient Left in bed;with call bell/phone within reach;with bed alarm set   Nurse Communication          Time: 1043-1100 OT Time Calculation (min): 17 min  Charges: OT General Charges $OT Visit: 1 Visit OT Treatments $Self Care/Home Management : 8-22 mins  Raynald Kemp, OT Acute Rehabilitation Services Pager: [phone] Office: [phone]    Pilar Grammes 09/12/2019, 12:55 PM

## 2019-09-12 NOTE — Progress Notes (Signed)
[facility] PROGRESS NOTE  Briana Andrews LFY:[id number] DOB: [birth year] DOA: 08/15/2019 PCP: Patient, [name]  Brief summary   53 yo alcoholic woman admitted with R frontal CVA, B popliteal artery occlusions, hepatic failure, GI bleeding. She was found to have new cardiomyopathy with an LV thrombus, cirrhosis, ARF. Underwent B LE thrombectomies, was treated with heparin, stabilized. Has required hemodynamic support w dobutamine. Received PRBC and was able to undergo EGD 9/29 that showed duodenal and gastric ulcers which were treated endoscopically.He has been weaned off of dobutamine. Heparin has been converted to coumadin. She is now on oral protonix.  On 08/29/2019 the patient developed bleeding from multiple sites including her mouth, urine, and rectum.  Patient's heparin was stopped for 1 hour and then restarted but her dose was reduced.  When it was restarted the patient again had bleeding with continued decline in Hgb. It was again placed on hold on 08/31/2019. It hasnow been converted to coumadin. Transthoracic Echocardiogram repeated on 08/31/2019. Persistent appearance of large left ventricular mural thrombus. Pelvic Ultrasound to investigate complaints of vaginal bleeding while on heparin: It was unable to evaluate the patient's endometrial stripe.  The patient underwent left and right heart catheterization on 09/02/2019. Conclusions from this study were:  1. Optimized filling pressures.  2. Mild pulmonary hypertension.  3. Preserved cardiac output.  4. Extensive CAD, primarily distal/branch vessel disease. The only interventional target would be 80% proximal RCA stenosis. Could consider RCA PCI if she has exertional symptoms despite medical therapy.  Assessment/Plan:  Biventricular failure some combination ischemic event vs stress-induced (from shock state, acidemia, ABLA, ?aspiration-induced sepsis)/acute on chronic systolic CHF.   Echo with 15 to 20% ejection fraction. mild  LV dilation, LV thrombus, mild-moderate MR, severely decreased RV systolic function. LHC/RHC done 10/6 showing normal filling pressures and preserved cardiac output.There was diffuse coronary disease, especially involving distal and branch vessels, only possible interventional target was 80% proximal RCA stenosis. No intervention at this time given good flow down the vessel and unlikely to significantly improve LV function.  Initial cardiogenic shock which responded to dobutamine which has been weaned to off since 09/06/2019. The patient was successfully extubated after EGD and respiratory status is stable on room air.  - Continue Lasix 40 mg po daily.  - Continue w/ Unna boots. - Continue spironolactone to 25 mg daily.   - Continue digoxin 0.125.  - Continue Coreg 3.125 mg bid.  - Continue losartan 25 mg daily.  With SBP in 90s-100s, will not transition to [facility], but please do not hold meds unless SBP < 85.  - Not candidate for advanced therapies with ETOH abuse/liver failure.   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 As per cardiology.  PAD: Bilateral popliteal artery occlusions, suspect cardioembolic from LV thrombus. Now s/p mechanical thrombectomy by vascular. Right foot still looks ischemic but dopplerable pulses.  - Continue anticoagulation, coumadin  Hemorrhage from mouth, urine, and rectum per nursing: resolved expect reporting dark stool.  Heparin has been stopped as the patient is now on coumadin. INR 1.8. No further obvious bleeding, hemoglobin has remained stable over 8.  Transfuse if less than 7. -coumadin per pharm .  INR therapeutic at 2.5 today.  Abnormal uterine bleeding: Stopped following cessation of heparin. Complete pelvic ultrasound wasperformed and was not sufficient to evaluate the patient's endometrial stripe. Dr. Marthann Schiller  discussed the patient with Dr. Earlene Plater who is on call for gynecology. She recommends that the patient follow up with gynecology  as outpatient.  No further inpatient evaluation is necessary.   LV clot on AC: ASA 81 mg daily. The patient is now therapeutic on coumadin. -Monitor CBC.  GI Bleed: The patient presented with melena with suspicion for upper GI source, intermittent transfusion requirements. EGD demonstrated oozing 62mm pre-pyloric ulcer. This was injected with Epi. Hgb 7.5 overnight and received 1 unit PRBC's. Hemoglobin dropped to 7 again on 08/29/2019 after observation of bleeding in mouth, in urine, and in stool.The patient was transfused with 2 units PRBC's. Bleedingappears to have stopped, hemoglobin remains a stable over 8. -f/u daily CBC  Alcoholism, hyperbilirubinemia:  clinically she has cirrhosis although imaging features not c/w this; LFT pattern not c/w alcoholic hepatitis nor ischemic nor congestive.  -WIl continue lactulose, rifaximin, thiamine and folate.  Encephalopathy: resolved.  AKI: Resolved.  CVA: likely secondary to emboli from LV clot. BL femoral artery occlusion s/p BL thrombectomy: RLE does not look great but dopplerable pulses, vascular surgery aware, poor candidate for further procedures, monitor clinically. -on Coumadin  Severe protein calorie malnutrition due to alcoholism:  -Nutrition Consult.  Rt sided pleural effusion without resp distress: likely due to hepatic hydrothorax vs. distrubutive from poor oncotic pressure.  -No indication to drain.  Code Status: DNR Family Communication: d/w patient, RN (indicate person spoken with, relationship, and if by phone, the number) Disposition Plan: Patient has remained stable for several days.  Per case manager, she has a bed at [facility] and her insurance authorization was initiated on 09/11/2019 and her COVID-19 test this is still pending.    Consultants:  Palliative Care  PCCM  Heart Failure Team  Vascular Surgery  Cardiology  Procedures:  EGD/Epinephrine injection  Mechanical Intubation  Central Venous  Catheter  Extubation  Arteriogram of right and left lower extremities  Mechanical thrombectomy of the left popliteal and posterior tibial artery using Penumbra CAT 6 device Mechanical thrombectomy of the right popliteal and posterior tibial artery using Penumbra CAT 6 device  Antibiotics: Anti-infectives (From admission, onward)   Start     Dose/Rate Route Frequency Ordered Stop   08/28/19 2200  rifaximin (XIFAXAN) tablet 550 mg     550 mg Oral 2 times daily 08/28/19 0934     08/26/19 1000  rifaximin (XIFAXAN) tablet 550 mg  Status:  Discontinued     550 mg Per Tube 2 times daily 08/25/19 2113 08/28/19 0935   08/20/19 1800  cefTRIAXone (ROCEPHIN) 2 g in sodium chloride 0.9 % 100 mL IVPB  Status:  Discontinued     2 g 200 mL/hr over 30 Minutes Intravenous Every 24 hours 08/20/19 1600 08/23/19 0825   08/18/19 0700  levofloxacin (LEVAQUIN) tablet 750 mg  Status:  Discontinued     750 mg Oral Every 48 hours 08/17/19 1205 08/20/19 1600   08/16/19 1115  rifaximin (XIFAXAN) tablet 550 mg  Status:  Discontinued     550 mg Oral 2 times daily 08/16/19 1114 08/25/19 2113   08/16/19 0700  levofloxacin (LEVAQUIN) IVPB 750 mg  Status:  Discontinued     750 mg 100 mL/hr over 90 Minutes Intravenous Every 48 hours 08/16/19 0616 08/17/19 1205       (indicate start date, and stop date if known)  HPI/Subjective: Seen and examined.  No complaints.  Objective: Vitals:   09/12/19 0438 09/12/19 0846  BP: 115/65 113/74  Pulse: 92 77  Resp: 20 16  Temp: (!) 97.5 F (36.4 C) 98.2 F (36.8 C)  SpO2: 93% 96%    Intake/Output Summary (Last 24  hours) at 09/12/2019 1013 Last data filed at 09/12/2019 0443 Gross per 24 hour  Intake 260 ml  Output 2050 ml  Net -1790 ml   Filed Weights   09/09/19 0500 09/10/19 0452 09/12/19 0438  Weight: 70.2 kg 70.3 kg 69 kg    Exam:  General exam: Appears calm and comfortable  Respiratory system: Clear to auscultation. Respiratory effort  normal. Cardiovascular system: S1 & S2 heard, RRR. No JVD, murmurs, rubs, gallops or clicks. No pedal edema. Gastrointestinal system: Abdomen is nondistended, soft and nontender. No organomegaly or masses felt. Normal bowel sounds heard. Central nervous system: Alert and oriented. No focal neurological deficits. Extremities: Symmetric 5 x 5 power. Skin: No rashes, lesions or ulcers.  Psychiatry: Judgement and insight appear normal. Mood & affect appropriate.     Data Reviewed: Basic Metabolic Panel: Recent Labs  Lab 09/07/19 0418 09/08/19 0418 09/09/19 0238 09/10/19 0411 09/11/19 0307 09/12/19 0154  NA 134* 134* 133* 134* 130* 131*  K 3.5 4.1 3.8 3.7 3.8 4.0  CL 97* 97* 96* 97* 93* 96*  CO2 30 29 27 28 26 27   GLUCOSE 74 75 69* 72 89 92  BUN 7 10 11 12 13 13   CREATININE 0.81 0.99 0.98 0.85 0.92 0.84  CALCIUM 7.3* 7.5* 7.5* 7.4* 7.3* 7.4*  MG 1.4* 2.1  --   --   --   --    Liver Function Tests: No results for input(s): AST, ALT, ALKPHOS, BILITOT, PROT, ALBUMIN in the last 168 hours. No results for input(s): LIPASE, AMYLASE in the last 168 hours. No results for input(s): AMMONIA in the last 168 hours. CBC: Recent Labs  Lab 09/06/19 0305  09/06/19 1607 09/07/19 0418 09/08/19 0418 09/09/19 0238 09/12/19 0154  WBC 5.6  --   --  5.9 6.1 6.8 5.4  HGB 8.4*   < > 8.5* 8.3* 8.4* 8.5* 16.2*  HCT 25.0*   < > 25.0* 24.8* 25.3* 25.3* 49.5*  MCV 96.5  --   --  95.4 96.2 95.1 95.2  PLT 281  --   --  312 381 382 174   < > = values in this interval not displayed.   Cardiac Enzymes: No results for input(s): CKTOTAL, CKMB, CKMBINDEX, TROPONINI in the last 168 hours. BNP (last 3 results) No results for input(s): BNP in the last 8760 hours.  ProBNP (last 3 results) No results for input(s): PROBNP in the last 8760 hours.  CBG: No results for input(s): GLUCAP in the last 168 hours.  No results found for this or any previous visit (from the past 240 hour(s)).   Studies: No  results found.  Scheduled Meds: . carvedilol  3.125 mg Oral BID WC  . Chlorhexidine Gluconate Cloth  6 each Topical Daily  . digoxin  0.125 mg Oral QODAY  . feeding supplement (ENSURE ENLIVE)  237 mL Oral TID BM  . folic acid  1 mg Oral Daily  . furosemide  40 mg Oral Daily  . gabapentin  200 mg Oral BID  . losartan  25 mg Oral BID  . mouth rinse  15 mL Mouth Rinse BID  . multivitamin with minerals  1 tablet Per Tube Daily  . nicotine  21 mg Transdermal Daily  . pantoprazole  40 mg Oral BID  . rifaximin  550 mg Oral BID  . rosuvastatin  5 mg Oral q1800  . spironolactone  25 mg Oral QHS  . thiamine  100 mg Oral Daily  . Warfarin - Pharmacist  Dosing Inpatient   Does not apply q1800   Continuous Infusions: . 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

## 2019-09-12 NOTE — Plan of Care (Signed)
  Problem: Coping: Goal: Will identify appropriate support needs Outcome: Progressing   Problem: Coping: Goal: Will verbalize positive feelings about self Outcome: Progressing

## 2019-09-12 NOTE — Progress Notes (Signed)
Physical Therapy Treatment Patient Details Name: Briana Andrews MRN: [id number] DOB: 1966-07-25 Today's Date: 09/12/2019    History of Present Illness 53 year old female with history of smoking, alcohol use, she drinks more than 6 packs of beer every day since last 20 years, no chronic medical issues brought to the emergency room with confusion more than usual and falls.  Since last few months there have been noticing some changes on her including weakness on her legs and poor appetite as much as she stopped smoking and decreased her drinking.  2 weeks ago, she was taken to [facility] with a fall, skeletal survey was negative except L1 fracture with 25% height loss and discharged home on muscle relaxants.  Patient has been intermittently confused since then. In the emergency room, she was found with subacute right ACA stroke, jaundice with bilirubin of 12, bilateral multifocal pneumonia and metabolic encephalopathy.s/p cardiac cath 10/6. Echo 10/4- LV thrombus.    PT Comments    Patient seen for mobility progression. Pt limited by c/o bilat foot and back pain during session. Pt requires min A for OOB mobility. Continue to progress as tolerated with anticipated d/c to SNF for further skilled PT services.     Follow Up Recommendations  SNF     Equipment Recommendations  None recommended by PT    Recommendations for Other Services       Precautions / Restrictions Precautions Precautions: Fall Precaution Comments: foley cath, back precautions, R foot skin breakdown    Mobility  Bed Mobility Overal bed mobility: Modified Independent Bed Mobility: Supine to Sit           General bed mobility comments: use of rail  Transfers Overall transfer level: Needs assistance Equipment used: Rolling walker (2 wheeled) Transfers: Sit to/from [facility] Sit to Stand: Min assist;+2 physical assistance;From elevated surface Stand pivot transfers: Min assist        General transfer comment: assist to power up into standing and then for balance with pivoting to [facility]  Ambulation/Gait Ambulation/Gait assistance: Min assist Gait Distance (Feet): 2 Feet Assistive device: Rolling walker (2 wheeled) Gait Pattern/deviations: Step-through pattern;Decreased stride length;Trunk flexed Gait velocity: decreased   General Gait Details: cues for safe use of AD and upright posture   Stairs             Wheelchair Mobility    Modified Rankin (Stroke Patients Only)       Balance Overall balance assessment: Needs assistance Sitting-balance support: Feet supported Sitting balance-Leahy Scale: Good     Standing balance support: Bilateral upper extremity supported Standing balance-Leahy Scale: Poor                              Cognition Arousal/Alertness: Awake/alert Behavior During Therapy: WFL for tasks assessed/performed Overall Cognitive Status: Impaired/Different from baseline Area of Impairment: Problem solving;Safety/judgement;Memory;Awareness                     Memory: Decreased short-term memory   Safety/Judgement: Decreased awareness of safety;Decreased awareness of deficits   Problem Solving: Slow processing;Requires verbal cues;Decreased initiation        Exercises      General Comments        Pertinent Vitals/Pain Pain Assessment: Faces Faces Pain Scale: Hurts little more Pain Location: feet and back Pain Descriptors / Indicators: Moaning;Guarding Pain Intervention(s): Limited activity within patient's tolerance;Monitored during session;Repositioned    Home Living  Prior Function            PT Goals (current goals can now be found in the care plan section) Acute Rehab PT Goals Patient Stated Goal: to move without pain Progress towards PT goals: Progressing toward goals    Frequency    Min 3X/week      PT Plan Current plan remains appropriate     Co-evaluation              AM-PAC PT "6 Clicks" Mobility   Outcome Measure  Help needed turning from your back to your side while in a flat bed without using bedrails?: A Little Help needed moving from lying on your back to sitting on the side of a flat bed without using bedrails?: A Little Help needed moving to and from a bed to a chair (including a wheelchair)?: A Little Help needed standing up from a chair using your arms (e.g., wheelchair or bedside chair)?: A Little Help needed to walk in hospital room?: A Lot Help needed climbing 3-5 steps with a railing? : Total 6 Click Score: 15    End of Session Equipment Utilized During Treatment: Gait belt Activity Tolerance: Patient tolerated treatment well Patient left: in chair;with call bell/phone within reach Nurse Communication: Mobility status PT Visit Diagnosis: Other abnormalities of gait and mobility (R26.89);Pain;Muscle weakness (generalized) (M62.81);Difficulty in walking, not elsewhere classified (R26.2) Pain - Right/Left: Right Pain - part of body: Ankle and joints of foot     Time: 1430-1509 PT Time Calculation (min) (ACUTE ONLY): 39 min  Charges:  $Gait Training: 8-22 mins $Therapeutic Activity: 8-22 mins                     Erline Levine, PTA Acute Rehabilitation Services Pager: [phone] Office: [phone]     Carolynne Edouard 09/12/2019, 4:42 PM

## 2019-09-12 NOTE — TOC Progression Note (Signed)
Transition of Care [facility]) - Progression Note    Patient Details  Name: Neely Kammerer MRN: [id number] Date of Birth: 1965/12/01  Transition of Care [facility]) CM/SW [place], [place] Phone Number: 09/12/2019, 3:12 PM  Clinical Narrative:     Patient's covid remains pending  Thurmond Butts, MSW, [facility] Clinical Social Worker [phone]   Expected Discharge Plan: [place] Barriers to Discharge: SNF Pending bed offer, Barriers Unresolved (comment)(hx ETOH- no bed offers)  Expected Discharge Plan and Services Expected Discharge Plan: [place] arrangements for the past 2 months: Single Family Home                                       Social Determinants of Health (SDOH) Interventions    Readmission Risk Interventions No flowsheet data found.

## 2019-09-13 LAB — CBC
HCT: 25.4 % — ABNORMAL LOW (ref 36.0–46.0)
Hemoglobin: 8.4 g/dL — ABNORMAL LOW (ref 12.0–15.0)
MCH: 31.5 pg (ref 26.0–34.0)
MCHC: 33.1 g/dL (ref 30.0–36.0)
MCV: 95.1 fL (ref 80.0–100.0)
Platelets: 455 10*3/uL — ABNORMAL HIGH (ref 150–400)
RBC: 2.67 MIL/uL — ABNORMAL LOW (ref 3.87–5.11)
RDW: 18 % — ABNORMAL HIGH (ref 11.5–15.5)
WBC: 9 10*3/uL (ref 4.0–10.5)
nRBC: 0 % (ref 0.0–0.2)

## 2019-09-13 LAB — PROTIME-INR
INR: 2.1 — ABNORMAL HIGH (ref 0.8–1.2)
Prothrombin Time: 23.3 seconds — ABNORMAL HIGH (ref 11.4–15.2)

## 2019-09-13 LAB — BASIC METABOLIC PANEL
Anion gap: 10 (ref 5–15)
BUN: 15 mg/dL (ref 6–20)
CO2: 24 mmol/L (ref 22–32)
Calcium: 7.4 mg/dL — ABNORMAL LOW (ref 8.9–10.3)
Chloride: 96 mmol/L — ABNORMAL LOW (ref 98–111)
Creatinine, Ser: 0.81 mg/dL (ref 0.44–1.00)
GFR calc Af Amer: >60 mL/min (ref >60)
GFR calc non Af Amer: >60 mL/min (ref >60)
Glucose, Bld: 86 mg/dL (ref 70–99)
Potassium: 4 mmol/L (ref 3.5–5.1)
Sodium: 130 mmol/L — ABNORMAL LOW (ref 135–145)

## 2019-09-13 MED ORDER — CARVEDILOL 3.125 MG PO TABS: 3.1250 mg | ORAL_TABLET | Freq: Two times a day (BID) | ORAL | 0 refills | Status: DC

## 2019-09-13 MED ORDER — FUROSEMIDE 40 MG PO TABS: 40.0000 mg | ORAL_TABLET | Freq: Every day | ORAL | 0 refills | Status: DC

## 2019-09-13 MED ORDER — LOSARTAN POTASSIUM 50 MG PO TABS: 50.0000 mg | ORAL_TABLET | Freq: Every day | ORAL | 0 refills | Status: DC

## 2019-09-13 MED ORDER — ADULT MULTIVITAMIN W/MINERALS CH: 1.0000 | ORAL_TABLET | Freq: Every day | ORAL | 0 refills | Status: DC

## 2019-09-13 MED ORDER — WARFARIN SODIUM 2.5 MG PO TABS: 2.5000 mg | ORAL_TABLET | Freq: Every day | ORAL | 0 refills | Status: DC

## 2019-09-13 MED ORDER — SPIRONOLACTONE 25 MG PO TABS: 25.0000 mg | ORAL_TABLET | Freq: Every day | ORAL | 0 refills | Status: DC

## 2019-09-13 MED ORDER — DIGOXIN 125 MCG PO TABS: 0.1250 mg | ORAL_TABLET | ORAL | 0 refills | Status: DC

## 2019-09-13 MED ORDER — GABAPENTIN 100 MG PO CAPS: 200.0000 mg | ORAL_CAPSULE | Freq: Two times a day (BID) | ORAL | 0 refills | Status: DC

## 2019-09-13 MED ORDER — FOLIC ACID 1 MG PO TABS: 1.0000 mg | ORAL_TABLET | Freq: Every day | ORAL | 0 refills | Status: DC

## 2019-09-13 MED ORDER — RIFAXIMIN 550 MG PO TABS: 550.0000 mg | ORAL_TABLET | Freq: Two times a day (BID) | ORAL | 0 refills | Status: DC

## 2019-09-13 MED ORDER — THIAMINE HCL 100 MG PO TABS: 100.0000 mg | ORAL_TABLET | Freq: Every day | ORAL | 0 refills | Status: DC

## 2019-09-13 MED ORDER — PANTOPRAZOLE SODIUM 40 MG PO TBEC: 40.0000 mg | DELAYED_RELEASE_TABLET | Freq: Every day | ORAL | 0 refills | Status: DC

## 2019-09-13 NOTE — TOC Transition Note (Signed)
Transition of Care [facility]) - CM/SW Discharge Note   Patient Details  Name: Christmas Faraci MRN: [id number] Date of Birth: Oct 22, 1966  Transition of Care [facility]) CM/SW Contact:  Bary Castilla, LCSW Phone Number: [phone] 09/13/2019, 10:20 AM   Clinical Narrative:    Patient will DC to: [facility]? Anticipated DC date:?09/13/19 Family notified:?Son Programmer, systems by: Corey Harold   Per MD patient ready for DC to [facility] RN, patient, patient's family, and facility notified of DC. Discharge Summary sent to facility. RN given number for report [phone] room 6. DC packet on chart. Ambulance transport requested for patient.  CSW signing off.   [place], [place] [phone]     Final next level of care: Skilled Nursing Facility Barriers to Discharge: No Barriers Identified   Patient Goals and CMS Choice        Discharge Placement              Patient chooses bed at: [facility] Patient to be transferred to facility by: [facility] Name of family member notified: Azrael Patient and family notified of of transfer: 09/13/19  Discharge Plan and Services                                     Social Determinants of Health (SDOH) Interventions     Readmission Risk Interventions No flowsheet data found.

## 2019-09-13 NOTE — Progress Notes (Signed)
Discharged to [facility] report called  

## 2019-09-13 NOTE — Discharge Summary (Signed)
Physician Discharge Summary  Briana Andrews ZOX:[id number] DOB: 02/04/1966 DOA: 08/15/2019  PCP: Patient, [name]  Admit date: 08/15/2019 Discharge date: 09/13/2019  Admitted From: Home Disposition: SNF  Recommendations for Outpatient Follow-up:  1. Follow up with PCP in 1-2 weeks 2. Follow with cardiology in 2 weeks 3. Please obtain BMP/CBC in one week 4. Please follow up on the following pending results:  Home Health: None Equipment/Devices: None  Discharge Condition: Stable CODE STATUS: DNR Diet recommendation: Cardiac  Subjective: Patient seen and examined.  No complaints.  Brief/Interim Summary: 53 yo alcoholic woman admitted with R frontal CVA, B popliteal artery occlusions, hepatic failure, GI bleeding. She was found to have new cardiomyopathy with an LV thrombus, cirrhosis, ARF. Underwent B LE thrombectomies, was treated with heparin, stabilized. Has required hemodynamic support w dobutamine. Received PRBC and was able to undergo EGD 9/29 that showed duodenal and gastric ulcers which were treated endoscopically.she has been weaned off of dobutamine. Heparin has been converted to coumadin. She is now on oral protonix.  On 08/29/2019 the patient developed bleeding from multiple sites including her mouth, urine, and rectum.  Patient's heparin was stopped for 1 hour and then restarted but her dose was reduced.  When it was restarted the patient again had bleeding with continued decline in Hgb. It was again placed on hold on 08/31/2019. It hasnow been converted to coumadin. Transthoracic Echocardiogram repeated on 08/31/2019. Persistent appearance of large left ventricular mural thrombus. Pelvic Ultrasound to investigate complaints of vaginal bleeding while on heparin: It was unable to evaluate the patient's endometrial stripe.  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 Conclusions from this  study were:  1. Optimized filling pressures.  2. Mild pulmonary hypertension.  3. Preserved cardiac output.  4. Extensive CAD, primarily distal/branch vessel disease. The only interventional target would be 80% proximal RCA stenosis. Could consider RCA PCI if she has exertional symptoms despite medical therapy.  Her hemoglobin has remained stable for last several days.  She is remained alert and oriented, her hepatic encephalopathy has resolved.  She was also started on lactulose, Xifaxan.  Cardiology eventually switched her to oral medications and she happens to be on Lasix, Aldactone, Coreg and digoxin.  Her AKI resolved.  She has been on Coumadin for last several days and INR is therapeutic.  She needs to follow-up with OB/GYN for outpatient further work-up for her vaginal bleeding which has stopped for last several days.  She also knows to see cardiology and PCP as outpatient.  She has been stable medically for last several days.  She was evaluated by PT OT and they recommended SNF.  It was difficult due to her history of EtOH and finally we have arranged a bed for her.  She was tested negative for COVID once again.  She is being discharged in stable condition.  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)   Acute CHF (congestive heart failure) (HCC)   Jaundice   Palliative care by specialist   Goals of care, counseling/discussion   Advanced care planning/counseling discussion   Acute  upper GI bleed   Pressure injury of skin   GI bleed   Acute on chronic systolic CHF (congestive heart failure) (HCC)   Endotracheally intubated    Discharge  Instructions  Discharge Instructions    Ambulatory referral to Neurology   Complete by: As directed    Follow up with stroke clinic NP (Jessica Vanschaick or Darrol Angel, if both not available, consider Manson Allan, or Ahern) at [facility] in about 4 weeks. Thanks.   Discharge patient   Complete by: As directed    Discharge disposition: 03-Skilled Nursing Facility   Discharge patient date: 09/13/2019     Allergies as of 09/13/2019      Reactions   Penicillins Other (See Comments)   Unsure of reaction Did it involve swelling of the face/tongue/throat, SOB, or low BP? Unknown Did it involve sudden or severe rash/hives, skin peeling, or any reaction on the inside of your mouth or nose? Unknown Did you need to seek medical attention at a hospital or doctor's office? Unknown When did it last happen?[place] If all above answers are "NO", may proceed with cephalosporin use.      Medication List    STOP taking these medications   naproxen 500 MG tablet Commonly known as: Naprosyn   traMADol 50 MG tablet Commonly known as: Ultram     TAKE these medications   carvedilol 3.125 MG tablet Commonly known as: COREG Take 1 tablet (3.125 mg total) by mouth 2 (two) times daily with a meal.   digoxin 0.125 MG tablet Commonly known as: LANOXIN Take 1 tablet (0.125 mg total) by mouth every other day.   folic acid 1 MG tablet Commonly known as: FOLVITE Take 1 tablet (1 mg total) by mouth daily.   furosemide 40 MG tablet Commonly known as: LASIX Take 1 tablet (40 mg total) by mouth daily.   gabapentin 100 MG capsule Commonly known as: NEURONTIN Take 2 capsules (200 mg total) by mouth 2 (two) times daily.   ibuprofen 200 MG tablet Commonly known as: ADVIL Take 200 mg by mouth every 6 (six) hours as needed for mild pain.   losartan 50 MG tablet Commonly known as: Cozaar Take 1 tablet (50 mg total) by mouth daily.   multivitamin with minerals Tabs tablet Take 1 tablet by  mouth daily.   pantoprazole 40 MG tablet Commonly known as: PROTONIX Take 1 tablet (40 mg total) by mouth daily.   rifaximin 550 MG Tabs tablet Commonly known as: XIFAXAN Take 1 tablet (550 mg total) by mouth 2 (two) times daily.   spironolactone 25 MG tablet Commonly known as: ALDACTONE Take 1 tablet (25 mg total) by mouth at bedtime.   thiamine 100 MG tablet Take 1 tablet (100 mg total) by mouth daily.   warfarin 2.5 MG tablet Commonly known as: Coumadin Take 1 tablet (2.5 mg total) by mouth daily.      Follow-up Information    [facility]. Schedule an appointment as soon as possible for a visit in 4 week(s).   Specialty: Neurology Contact information: [address] [place] [address] [phone]       [facility] Follow up on 09/22/2019.   Specialty: Cardiology Why: at [address]  Contact information: [address] [id number] [place] [address] [phone]       Lars Masson, MD Follow up in 1 week(s).   Specialty: Cardiology Contact information: [address] [place] [address] [phone]          Allergies  Allergen Reactions  . Penicillins Other (See Comments)    Unsure of reaction Did it involve swelling of the face/tongue/throat, SOB, or low BP? Unknown Did it involve sudden or severe rash/hives, skin peeling, or any reaction on the inside of your mouth or nose? Unknown Did you need to seek medical attention at a hospital or doctor's office? Unknown When did it last happen?[place] If all above answers are "NO", may proceed with cephalosporin use.    Consultations: Cardiology, vascular surgery.,  GI   Procedures/Studies: Ct Abdomen Pelvis Wo Contrast  Result Date: 08/16/2019 CLINICAL DATA:  Shortness of breath for 1 week.  Jaundice. EXAM: CT ABDOMEN AND PELVIS WITHOUT CONTRAST  TECHNIQUE: Multidetector CT imaging of the abdomen and pelvis was performed following the standard protocol without IV contrast. COMPARISON:  None. FINDINGS: Lower chest: Moderate right pleural effusion. Small left pleural effusion. Patchy airspace opacities in the lung bases consistent with atelectasis, pneumonia or a combination. A component of pneumonia in the left lower lobe is suspected. Moderate heart enlargement. Hepatobiliary: No focal liver abnormality is seen. No gallstones, gallbladder wall thickening, or biliary dilatation. Pancreas: Unremarkable. No pancreatic ductal dilatation or surrounding inflammatory changes. Spleen: Normal in size without focal abnormality. Adrenals/Urinary Tract: No adrenal masses. Kidneys normal size, orientation and position. No renal masses, stones or hydronephrosis. Normal ureters. Normal bladder. Stomach/Bowel: Stomach is within normal limits. Appendix appears normal. No evidence of bowel wall thickening, distention, or inflammatory changes. Vascular/Lymphatic: Aortic atherosclerosis. No aneurysm. No enlarged lymph nodes. Reproductive: Uterus and bilateral adnexa are unremarkable. Other: No abdominal wall hernia or abnormality. No abdominopelvic ascites. Musculoskeletal: Fracture of the upper L1 vertebra with mild endplate depression. This appears recent. No other fractures. No bone lesions. IMPRESSION: 1. Moderate cardiomegaly. Moderate-sized right pleural effusion. Small left pleural effusion. 2. Patchy lung base airspace opacities most prominent in the left lower lobe. Consider pneumonia if there are consistent clinical findings. Findings could be due to atelectasis. 3. Recent appearing nondisplaced fracture the upper L1 vertebra with mild depression of the upper endplate. 4. No other acute findings within the abdomen or pelvis. No evidence of bile duct dilation. No liver mass or abnormal attenuation. Cause of jaundice not evident. 5. Aortic atherosclerosis.  Electronically Signed   By: Amie Portland M.D.   On: 08/16/2019 07:13   Ct Head Wo Contrast  Result Date: 08/16/2019 CLINICAL DATA:  Encephalopathy EXAM: CT HEAD WITHOUT CONTRAST TECHNIQUE: Contiguous axial images were obtained from the base of the skull through the vertex without intravenous contrast. COMPARISON:  None. FINDINGS: Brain: Focal hypoattenuation within the right frontal lobe, within the anterior cerebral artery distribution. No intracranial hemorrhage or extra-axial collection. No midline shift, other mass effect or hydrocephalus. Vascular: No abnormal hyperdensity of the major intracranial arteries or dural venous sinuses. No intracranial atherosclerosis. Skull: The visualized skull base, calvarium and extracranial soft tissues are normal. Sinuses/Orbits: No fluid levels or advanced mucosal thickening of the visualized paranasal sinuses. No mastoid or middle ear effusion. The orbits are normal. IMPRESSION: 1. Focal hypoattenuation within the paramedian right frontal lobe, likely indicating late acute to subacute infarct. MRI may be helpful for better temporal characterization. 2. No hemorrhage or mass effect. Electronically Signed   By: Deatra Robinson M.D.   On: 08/16/2019 06:57   Mr Angio Head Wo Contrast  Result Date: 08/16/2019 CLINICAL DATA:  Stroke follow-up EXAM: MRA HEAD WITHOUT CONTRAST TECHNIQUE:  Angiographic images of the Circle of Willis were obtained using MRA technique without intravenous contrast. COMPARISON:  Brain MRI 08/16/2019 FINDINGS: The study is markedly motion degraded. POSTERIOR CIRCULATION: --Vertebral arteries: Normal V4 segments. --Posterior inferior cerebellar arteries (PICA): Patent origins from the vertebral arteries. --Anterior inferior cerebellar arteries (AICA): Patent origins from the basilar artery. --Basilar artery: Normal. --Superior cerebellar arteries: Normal. --Posterior cerebral arteries: Normal. The left PCA is predominantly supplied by the posterior  communicating artery. ANTERIOR CIRCULATION: --Intracranial internal carotid arteries: Normal. --Anterior cerebral arteries (ACA): Normal. Both A1 segments are present. Patent anterior communicating artery (a-comm). --Middle cerebral arteries (MCA): Normal. IMPRESSION: 1. Markedly motion degraded examination. 2. No emergent large vessel occlusion or high-grade proximal stenosis. Electronically Signed   By: Deatra Robinson M.D.   On: 08/16/2019 23:37   Mr Angio Neck Wo Contrast  Result Date: 08/16/2019 CLINICAL DATA:  Stroke follow-up EXAM: MRA NECK WITHOUT CONTRAST TECHNIQUE: Multiplanar and multiecho pulse sequences of the neck were obtained without intravenous contrast. Angiographic images of the neck were obtained using time-of-flight MRA technique. CONTRAST:  None COMPARISON:  None. FINDINGS: Study is degraded by motion and the lack of intravenous contrast material. The visualized portions of the common carotid arteries are normal. The V2 segments of both vertebral arteries are patent. There is limited visualization of the internal carotid arteries. IMPRESSION: Severely motion limited study. The common carotid arteries and vertebral artery V2 segments are patent. Visualization of the internal carotid arteries is markedly limited. Consider repeat examination with contrast when the patient is better able to follow instructions. Electronically Signed   By: Deatra Robinson M.D.   On: 08/16/2019 23:49   Mr Brain Wo Contrast  Addendum Date: 08/17/2019   ADDENDUM REPORT: 08/17/2019 09:46 ADDENDUM: Not mentioned in the original report is a subcentimeter linear focus of subtle diffusion abnormality in the subcortical white matter of the left middle frontal gyrus corresponding to faint enhancement on subsequent postcontrast brain MRI and also likely reflecting a subacute infarct. Electronically Signed   By: Sebastian Ache M.D.   On: 08/17/2019 09:46   Result Date: 08/17/2019 CLINICAL DATA:  TIA. Altered mental status  and generalized weakness. EXAM: MRI HEAD WITHOUT CONTRAST TECHNIQUE: Multiplanar, multiecho pulse sequences of the brain and surrounding structures were obtained without intravenous contrast. COMPARISON:  Head CT 08/16/2019 FINDINGS: Brain: An approximately 3.5 cm wedge-shaped region of T2 hyperintensity, trace diffusion hyperintensity, and facilitated diffusion in the medial right frontal lobe involves white matter and cortex and is most consistent with a subacute ACA territory infarct. A 3 mm focus of diffusion abnormality involving right parietal white matter also likely reflects a subacute infarct. Low level diffusion abnormality involving the right caudate head may also reflect a subacute infarct. No intracranial hemorrhage, mass effect, or extra-axial fluid collection is identified. The ventricles and sulci are within normal limits for age. Scattered small foci of T2 hyperintensity in the cerebral white matter bilaterally are nonspecific but compatible with mild chronic small vessel ischemic disease. There are small chronic left cerebellar infarcts. Vascular: Major intracranial vascular flow voids are preserved. Skull and upper cervical spine: Unremarkable bone marrow signal. Sinuses/Orbits: Unremarkable orbits. Paranasal sinuses and mastoid air cells are clear. Other: None. IMPRESSION: 1. 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 2. Suspected small subacute infarcts involving the right caudate nucleus and right parietal white matter. 3. Chronic left cerebellar infarcts. Electronically Signed: By: Sebastian Ache M.D. On: 08/16/2019 10:48   Mr Brain W Contrast  Result Date: 08/16/2019  CLINICAL DATA:  53 year old female with altered mental status and generalized weakness, multiple subacute infarcts on brain MRI earlier today. EXAM: MRI HEAD WITH CONTRAST TECHNIQUE: Multiplanar, multiecho pulse sequences of the brain and surrounding structures were obtained with intravenous contrast. CONTRAST:   32mL GADAVIST GADOBUTROL 1 MMOL/ML IV SOLN COMPARISON:  Noncontrast brain MRI earlier today. FINDINGS: Mild gyriform post ischemic appearing enhancement in the anterior right superior frontal gyrus, corresponding to the most confluent area of diffusion signal abnormality earlier today (series 7, image 22). Faint linear post ischemic appearing enhancement also in the white matter of the left middle frontal gyrus, corresponding to subtle diffusion abnormality today). No other abnormal intracranial enhancement.  No dural thickening. The major dural venous sinuses are enhancing and appear to be patent. No intracranial mass effect or ventriculomegaly. IMPRESSION: 1. Gyriform post ischemic appearing enhancement in the right ACA territory, corresponding to most confluent diffusion abnormality earlier today. 2. Faint linear enhancement in the left frontal lobe white matter appears to reflect an area of late subacute ischemia. Electronically Signed   By: Genevie Ann M.D.   On: 08/16/2019 16:58   US Pelvis (transabdominal Only)  Result Date: 09/03/2019 CLINICAL DATA:  Vaginal bleeding for 2 days EXAM: TRANSABDOMINAL ULTRASOUND OF PELVIS TECHNIQUE: Transabdominal ultrasound examination of the pelvis was performed including evaluation of the uterus, ovaries, adnexal regions, and pelvic cul-de-sac. Patient refused transvaginal imaging. COMPARISON:  None FINDINGS: Uterus Uterus is not visualized likely due to decompression of urinary bladder by Foley catheter and poor acoustic window. Numerous bowel loops in pelvis. Endometrium Uterus and endometrium not visualized due to bowel gas and decompressed bladder Right ovary Not visualized on either transabdominal or endovaginal imaging, likely obscured by bowel Left ovary Not visualized on either transabdominal or endovaginal imaging, likely obscured by bowel Other findings: No free fluid. No obvious pelvic masses. Foley catheter seen within decompressed bladder. IMPRESSION:  Nondiagnostic exam due to decompressed urinary bladder related to Foley catheter and subsequent poor acoustic window. Numerous bowel loops in pelvis with shadowing. Patient refused transvaginal imaging. Electronically Signed   By: Lavonia Dana M.D.   On: 09/03/2019 12:59   Dg Chest Port 1 View  Result Date: 08/25/2019 CLINICAL DATA:  Intubation. EXAM: PORTABLE CHEST 1 VIEW COMPARISON:  08/23/2019 FINDINGS: Patient is rotated to the right. Endotracheal tube has tip 2.9 cm above the carina. Enteric tube courses into the stomach and off the film as tip is not visualized. Right IJ central venous catheter unchanged with tip over the SVC. Persistent moderate size right pleural effusion and small left pleural effusion likely with associated bibasilar atelectasis. Infection in the lung bases is possible. Stable cardiomegaly. Remainder of the exam is unchanged. IMPRESSION: Stable moderate right effusion and small left effusion likely with associated bibasilar atelectasis. Infection in the lung bases is possible. Stable cardiomegaly. Tubes and lines as described. Electronically Signed   By: Marin Olp M.D.   On: 08/25/2019 16:28   Dg Chest Port 1 View  Result Date: 08/23/2019 CLINICAL DATA:  Shortness of breath. EXAM: PORTABLE CHEST 1 VIEW COMPARISON:  Radiograph of August 22, 2019. FINDINGS: Stable cardiomegaly. Right internal jugular catheter tip is seen in expected position of the SVC. No pneumothorax is noted. Continued presence of moderate to large right pleural effusion is noted with associated atelectasis or infiltrate. Stable left basilar atelectasis or infiltrate is noted as well. Bony thorax is unremarkable. IMPRESSION: Stable right pleural effusion. Stable bibasilar atelectasis or infiltrates. Electronically Signed   By: Jeneen Rinks  Christen Butter M.D.   On: 08/23/2019 08:36   Dg Chest Port 1 View  Result Date: 08/22/2019 CLINICAL DATA:  Encounter for central line placement EXAM: PORTABLE CHEST 1 VIEW  COMPARISON:  08/20/2019 FINDINGS: RIGHT central venous line with tip in the proximal SVC. This a change in position from the distal SVC on comparison exam 2 days prior. Stable large cardiac silhouette. Moderate to large RIGHT effusion again noted. No pneumothorax. Lung bases poorly evaluated IMPRESSION: 1. RIGHT central venous line now positioned with the tip in the most proximal SVC. 2. Moderate RIGHT pleural effusion unchanged. Electronically Signed   By: Genevive Bi M.D.   On: 08/22/2019 18:23   Dg Chest Port 1 View  Result Date: 08/20/2019 CLINICAL DATA:  Reason for exam: Encounter for central line placement. EXAM: PORTABLE CHEST 1 VIEW COMPARISON:  Chest radiograph 08/16/2019 FINDINGS: Stable cardiomediastinal contours with enlarged heart size. Interval placement of a right central venous catheter with tip projecting over the cavoatrial junction. New moderate-sized right pleural effusion. Bilateral opacities in the lung bases may represent a combination of infection and or atelectasis. No pneumothorax. No acute finding in the visualized skeleton. IMPRESSION: 1. Interval placement of right central venous catheter with tip projecting over the cavoatrial junction. No pneumothorax. 2. Moderate size right pleural effusion. 3. Bibasilar opacities may represent a combination of infection and/or atelectasis. Electronically Signed   By: Emmaline Kluver M.D.   On: 08/20/2019 16:39   Dg Chest Portable 1 View  Result Date: 08/16/2019 CLINICAL DATA:  Shortness of breath EXAM: PORTABLE CHEST 1 VIEW COMPARISON:  None. FINDINGS: There are multiple areas of increased airspace opacity within both lungs, including the right upper lobe, right lung base and left lung base. There is mild cardiomegaly. No pneumothorax or sizable pleural effusion. IMPRESSION: 1. Multifocal airspace disease in both lungs, which may indicate multifocal infection. 2. Mild cardiomegaly. Electronically Signed   By: Deatra Robinson M.D.   On:  08/16/2019 03:35   Dg Foot Complete Right  Result Date: 08/16/2019 CLINICAL DATA:  Shortness of breath swelling to the right foot EXAM: RIGHT FOOT COMPLETE - 3+ VIEW COMPARISON:  None. FINDINGS: No fracture or malalignment. Moderate to large plantar calcaneal spur. IMPRESSION: No acute osseous abnormality. Electronically Signed   By: Jasmine Pang M.D.   On: 08/16/2019 03:09   Vas [place] Vanice Sarah With/wo Tbi  Result Date: 08/21/2019 LOWER EXTREMITY DOPPLER STUDY Indications: Peripheral artery disease.  Vascular Interventions: 08/19/2019 - ABDOMINAL AORTOGRAM W/LOWER EXTREMITY                         PERIPHERAL VASCULAR BALLOON ANGIOPLASTY                          08/20/2019 - ABDOMINAL AORTOGRAM W/LOWER EXTREMITY                         PERIPHERAL VASCULAR INTERVENTION. Limitations: Today's exam was limited due to patient positioning, an open wound,              involuntary patient movement and patient pain tolerance. Comparison Study: No prior studies. Performing Technologist: Olen Cordial Rvt  Examination Guidelines: A complete evaluation includes at minimum, Doppler waveform signals and systolic blood pressure reading at the level of bilateral brachial, anterior tibial, and posterior tibial arteries, when vessel segments are accessible. Bilateral testing is considered an integral part of a complete examination.  Photoelectric Plethysmograph (PPG) waveforms and toe systolic pressure readings are included as required and additional duplex testing as needed. Limited examinations for reoccurring indications may be performed as noted.  ABI Findings: +--------+------------------+-----+--------+--------+ Right   Rt Pressure (mmHg)IndexWaveformComment  +--------+------------------+-----+--------+--------+ Brachial                               PICC     +--------+------------------+-----+--------+--------+ PTA     63                0.59 biphasic         +--------+------------------+-----+--------+--------+  DP      47                0.44 biphasic         +--------+------------------+-----+--------+--------+ +--------+------------------+-----+---------+-------+ Left    Lt Pressure (mmHg)IndexWaveform Comment +--------+------------------+-----+---------+-------+ [id number]                    triphasic        +--------+------------------+-----+---------+-------+ PTA     59                0.56 biphasic         +--------+------------------+-----+---------+-------+ DP      55                0.52 biphasic         +--------+------------------+-----+---------+-------+ +-------+-----------+-----------+------------+------------+ ABI/TBIToday's ABIToday's TBIPrevious ABIPrevious TBI +-------+-----------+-----------+------------+------------+ Right  0.59                                           +-------+-----------+-----------+------------+------------+ Left   0.56                                           +-------+-----------+-----------+------------+------------+  Summary: Right: Resting right ankle-brachial index indicates moderate right lower extremity arterial disease. BP cuff was placed on the proximal calf due to wounds on the distal calf/ankle. Unable to obtain TBI due to patient movement, and pain tolerance. Left: Resting left ankle-brachial index indicates moderate left lower extremity arterial disease. Unable to obtain TBI due to patient movement, and pain tolerance.  *See table(s) above for measurements and observations.  Electronically signed by Sherald Hesshristopher Clark MD on 08/21/2019 at 1:58:43 PM.    Final    [place] Liver Doppler  Result Date: 08/18/2019 CLINICAL DATA:  Jaundice. EXAM: DUPLEX ULTRASOUND OF LIVER TECHNIQUE: Color and duplex Doppler ultrasound was performed to evaluate the hepatic in-flow and out-flow vessels. COMPARISON:  CT scan of August 16, 2019. FINDINGS: Liver: Normal parenchymal echogenicity. Normal hepatic contour without nodularity. No focal  lesion, mass or intrahepatic biliary ductal dilatation. Main Portal Vein size: 1.0 cm Portal Vein Velocities Main Prox:  17 cm/sec Main Mid: 13 cm/sec Main Dist:  12 cm/sec Right: 9 cm/sec Left: 10 cm/sec Normal hepatopetal flow is noted in the portal veins. Hepatic Vein Velocities Right:  28 cm/sec Middle:  29 cm/sec Left:  32 cm/sec Normal hepatofugal flow is noted in the hepatic veins. IVC: Present and patent with normal respiratory phasicity. Hepatic Artery Velocity:  44 cm/sec Splenic Vein Velocity:  22 cm/sec Spleen: 9.1 cm x 0.6 cm x 8.3 cm with a total volume of 140 cm^3 (411 cm^3 is upper  limit normal) Portal Vein Occlusion/Thrombus: No Splenic Vein Occlusion/Thrombus: No Ascites: None Varices: None IMPRESSION: No evidence of hepatic, portal or splenic venous thrombosis or occlusion is noted. Electronically Signed   By: Lupita Raider M.D.   On: 08/18/2019 07:28   US Pelvic Complete With Transvaginal  Result Date: 09/03/2019 CLINICAL DATA:  Abnormal vaginal bleeding EXAM: TRANSABDOMINAL AND TRANSVAGINAL ULTRASOUND OF PELVIS TECHNIQUE: Both transabdominal and transvaginal ultrasound examinations of the pelvis were performed. Transabdominal technique was performed for global imaging of the pelvis including uterus, ovaries, adnexal regions, and pelvic cul-de-sac. It was necessary to proceed with endovaginal exam following the transabdominal exam to visualize the adnexa and uterus. COMPARISON:  09/03/2019 FINDINGS: Uterus Measurements: Approximately 7.3 x 4.2 x 4.9 cm = volume: 78.8 mL. Poorly visualized due to Foley catheter and rectal tube. Endometrium Unable to be visualized or measures Right ovary Probably visualized trans abdominally. Measurements: 1.5 x 1.4 x 1.4 cm = volume: 1.6 mL. No obvious mass. Left ovary Probably visualized trans abdominally. Measurements: 2.7 x 1 x 2.6 cm = volume: 4 mL. No gross mass. Other findings No abnormal free fluid. IMPRESSION: 1. Very limited study. The uterus and  adnexa are poorly visible due to the presence of Foley catheter and what appears to be rectal tubing. The endometrial stripe could not be confidently visualized or measured. Electronically Signed   By: Jasmine Pang M.D.   On: 09/03/2019 21:19   Vas US Carotid  Result Date: 08/19/2019 Carotid Arterial Duplex Study Indications:       CVA. Risk Factors:      Current smoker. Limitations        Today's exam was limited due to the patient's inability or                    unwillingness to cooperate. Comparison Study:  no prior Performing Technologist: Jeb Levering RDMS, RVT  Examination Guidelines: A complete evaluation includes B-mode imaging, spectral Doppler, color Doppler, and power Doppler as needed of all accessible portions of each vessel. Bilateral testing is considered an integral part of a complete examination. Limited examinations for reoccurring indications may be performed as noted.  Right Carotid Findings: +----------+--------+--------+--------+------------------+--------+           PSV cm/sEDV cm/sStenosisPlaque DescriptionComments +----------+--------+--------+--------+------------------+--------+ CCA Prox  44      12                                         +----------+--------+--------+--------+------------------+--------+ CCA Distal65      16                                         +----------+--------+--------+--------+------------------+--------+ ICA Prox  33      11      1-39%   heterogenous               +----------+--------+--------+--------+------------------+--------+ ICA Distal41      15                                         +----------+--------+--------+--------+------------------+--------+ ECA       55      8                                          +----------+--------+--------+--------+------------------+--------+ +----------+--------+-------+----------------+-------------------+  PSV cm/sEDV cmsDescribe        Arm Pressure (mmHG)  +----------+--------+-------+----------------+-------------------+ Subclavian89             Multiphasic, WNL                    +----------+--------+-------+----------------+-------------------+ +---------+--------+--+--------+-+---------+ VertebralPSV cm/s33EDV cm/s7Antegrade +---------+--------+--+--------+-+---------+  Left Carotid Findings: +----------+--------+--------+--------+------------------+--------+           PSV cm/sEDV cm/sStenosisPlaque DescriptionComments +----------+--------+--------+--------+------------------+--------+ CCA Prox  77      20                                         +----------+--------+--------+--------+------------------+--------+ CCA Distal46      15                                         +----------+--------+--------+--------+------------------+--------+ ICA Prox  47      20      1-39%   heterogenous               +----------+--------+--------+--------+------------------+--------+ ICA Distal61      25                                         +----------+--------+--------+--------+------------------+--------+ ECA       60      11                                         +----------+--------+--------+--------+------------------+--------+ +----------+--------+--------+----------------+-------------------+           PSV cm/sEDV cm/sDescribe        Arm Pressure (mmHG) +----------+--------+--------+----------------+-------------------+ [id number]              Multiphasic, WNL                    +----------+--------+--------+----------------+-------------------+ +---------+--------+--+--------+-+---------+ VertebralPSV cm/s29EDV cm/s8Antegrade +---------+--------+--+--------+-+---------+  Summary: Right Carotid: Velocities in the right ICA are consistent with a 1-39% stenosis. Left Carotid: Velocities in the left ICA are consistent with a 1-39% stenosis. Vertebrals:  Bilateral vertebral arteries demonstrate  antegrade flow. Subclavians: Normal flow hemodynamics were seen in bilateral subclavian              arteries. *See table(s) above for measurements and observations.  Electronically signed by Delia Heady MD on 08/19/2019 at 8:08:58 AM.    Final    Vas [place] Lower Extremity Arterial Duplex  Result Date: 08/18/2019 LOWER EXTREMITY ARTERIAL DUPLEX STUDY Other Factors: Leg weakness and discoloration, Afib.  Current ABI: No ABI due to absent pedal flow to obtain pressure Limitations: Poor image quality due to sunlight in patient's room Comparison Study: no prior Performing Technologist: Jeb Levering RDMS, RVT  Examination Guidelines: A complete evaluation includes B-mode imaging, spectral Doppler, color Doppler, and power Doppler as needed of all accessible portions of each vessel. Bilateral testing is considered an integral part of a complete examination. Limited examinations for reoccurring indications may be performed as noted.  +-----------+--------+-----+--------+----------+------------------+ RIGHT      PSV cm/sRatioStenosisWaveform  Comments           +-----------+--------+-----+--------+----------+------------------+ CFA Prox   57  triphasic                    +-----------+--------+-----+--------+----------+------------------+ DFA        43                   triphasic                    +-----------+--------+-----+--------+----------+------------------+ SFA Prox   61                   triphasic                    +-----------+--------+-----+--------+----------+------------------+ SFA Mid    54                   triphasic                    +-----------+--------+-----+--------+----------+------------------+ SFA Distal 34                   triphasic                    +-----------+--------+-----+--------+----------+------------------+ POP Prox   25                   monophasicthrombus vs plaque  +-----------+--------+-----+--------+----------+------------------+ POP Mid                 occluded                             +-----------+--------+-----+--------+----------+------------------+ POP Distal              occluded                             +-----------+--------+-----+--------+----------+------------------+ ATA Distal 7                    monophasicseverely dampened  +-----------+--------+-----+--------+----------+------------------+ PTA Distal              occluded                             +-----------+--------+-----+--------+----------+------------------+ PERO Distal12                   monophasicseverely dampened  +-----------+--------+-----+--------+----------+------------------+  +-----------+--------+-----+--------+----------+------------------+ LEFT       PSV cm/sRatioStenosisWaveform  Comments           +-----------+--------+-----+--------+----------+------------------+ CFA Prox   64                   triphasic                    +-----------+--------+-----+--------+----------+------------------+ DFA        67                   triphasic                    +-----------+--------+-----+--------+----------+------------------+ SFA Prox   65                   triphasic                    +-----------+--------+-----+--------+----------+------------------+ SFA Mid    74  triphasic                    +-----------+--------+-----+--------+----------+------------------+ SFA Distal 40                   triphasic                    +-----------+--------+-----+--------+----------+------------------+ POP Prox   21                   biphasic                     +-----------+--------+-----+--------+----------+------------------+ POP Mid                 occluded          partially occluded +-----------+--------+-----+--------+----------+------------------+ POP Distal              occluded           thrombus vs plaque +-----------+--------+-----+--------+----------+------------------+ ATA Distal 6                    monophasicseverely dampened  +-----------+--------+-----+--------+----------+------------------+ PTA Distal 11                   monophasicseverely dampened  +-----------+--------+-----+--------+----------+------------------+ PERO Distal8                    monophasicseverely dampened  +-----------+--------+-----+--------+----------+------------------+  Arterial Summary: 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 Incidental finding Right popliteal vein DVT. 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  See table(s) above for measurements and observations. Electronically signed by Sherald Hess MD on 08/18/2019 at 4:25:28 PM.    Final    [place] Ekg Site Rite  Result Date: 08/20/2019 If Site Rite image not attached, placement could not be confirmed due to current cardiac rhythm.    Discharge Exam: Vitals:   09/13/19 0432 09/13/19 0755  BP: 105/66 117/61  Pulse: 88   Resp: 18 (!) 24  Temp: 100 F (37.8 C) 98.5 F (36.9 C)  SpO2: 92%    Vitals:   09/12/19 1937 09/12/19 2335 09/13/19 0432 09/13/19 0755  BP: 96/62 106/66 105/66 117/61  Pulse: 80 91 88   Resp: (!) 22 16 18  (!) 24  Temp: 98.4 F (36.9 C) 99.1 F (37.3 C) 100 F (37.8 C) 98.5 F (36.9 C)  TempSrc: Oral Oral Oral Oral  SpO2: 100% 98% 92%   Weight:   68.1 kg   Height:        General: Pt is alert, awake, not in acute distress Cardiovascular: RRR, S1/S2 +, no rubs, no gallops Respiratory: CTA bilaterally, no wheezing, no rhonchi Abdominal: Soft, NT, ND, bowel sounds + Extremities: no edema, no cyanosis    The results of significant diagnostics from this hospitalization (including imaging, microbiology, ancillary and laboratory) are listed below for reference.     Microbiology: Recent Results (from the past 240 hour(s))  Novel Coronavirus, NAA (Hosp order,  Send-out to Ref Lab; TAT 18-24 hrs     Status: None   Collection Time: 09/11/19  3:15 PM   Specimen: Nasopharyngeal Swab; Respiratory  Result Value Ref Range Status   SARS-CoV-2, NAA NOT DETECTED NOT DETECTED Final    Comment: (NOTE) This nucleic acid amplification test was developed and its performance characteristics determined by [employer]. Nucleic acid amplification tests include PCR and TMA. This test has not been FDA cleared or approved. This test has been authorized by  FDA under an Emergency Use Authorization (EUA). This test is only authorized for the duration of time the declaration that circumstances exist justifying the authorization of the emergency use of in vitro diagnostic tests for detection of SARS-CoV-2 virus and/or diagnosis of COVID-19 infection under section 564(b)(1) of the Act, 21 U.S.C. 161WRU-0(A) (1), unless the authorization is terminated or revoked sooner. When diagnostic testing is negative, the possibility of a false negative result should be considered in the context of a patient's recent exposures and the presence of clinical signs and symptoms consistent with COVID-19. An individual without symptoms of COVID- 19 and who is not shedding SARS-CoV-2 vi rus would expect to have a negative (not detected) result in this assay. Performed At: [facility] [address] [place], [place] [id number] Jolene Schimke MD YN:[id number]    Coronavirus Source NASOPHARYNGEAL  Final    Comment: Performed at [facility], [address], [place], [place] [address]     Labs: BNP (last 3 results) No results for input(s): BNP in the last 8760 hours. Basic Metabolic Panel: Recent Labs  Lab 09/07/19 0418 09/08/19 0418 09/09/19 0238 09/10/19 0411 09/11/19 0307 09/12/19 0154 09/13/19 0325  NA 134* 134* 133* 134* 130* 131* 130*  K 3.5 4.1 3.8 3.7 3.8 4.0 4.0  CL 97* 97* 96* 97* 93* 96* 96*  CO2 GLUCOSE 74 75 69* 72  89 92 86  BUN CREATININE 0.81 0.99 0.98 0.85 0.92 0.84 0.81  CALCIUM 7.3* 7.5* 7.5* 7.4* 7.3* 7.4* 7.4*  MG 1.4* 2.1  --   --   --   --   --    Liver Function Tests: No results for input(s): AST, ALT, ALKPHOS, BILITOT, PROT, ALBUMIN in the last 168 hours. No results for input(s): LIPASE, AMYLASE in the last 168 hours. No results for input(s): AMMONIA in the last 168 hours. CBC: Recent Labs  Lab 09/07/19 0418 09/08/19 0418 09/09/19 0238 09/12/19 0154 09/13/19 0702  WBC 5.9 6.1 6.8 5.4 9.0  HGB 8.3* 8.4* 8.5* 16.2* 8.4*  HCT 24.8* 25.3* 25.3* 49.5* 25.4*  MCV 95.4 96.2 95.1 95.2 95.1  PLT 312 381 382 174 455*   Cardiac Enzymes: No results for input(s): CKTOTAL, CKMB, CKMBINDEX, TROPONINI in the last 168 hours. BNP: Invalid input(s): POCBNP CBG: No results for input(s): GLUCAP in the last 168 hours. D-Dimer No results for input(s): DDIMER in the last 72 hours. Hgb A1c No results for input(s): HGBA1C in the last 72 hours. Lipid Profile No results for input(s): CHOL, HDL, LDLCALC, TRIG, CHOLHDL, LDLDIRECT in the last 72 hours. Thyroid function studies No results for input(s): TSH, T4TOTAL, T3FREE, THYROIDAB in the last 72 hours.  Invalid input(s): FREET3 Anemia work up No results for input(s): VITAMINB12, FOLATE, FERRITIN, TIBC, IRON, RETICCTPCT in the last 72 hours. Urinalysis    Component Value Date/Time   COLORURINE AMBER (A) 08/16/2019 0930   APPEARANCEUR TURBID (A) 08/16/2019 0930   LABSPEC 1.020 08/16/2019 0930   PHURINE 5.0 08/16/2019 0930   GLUCOSEU NEGATIVE 08/16/2019 0930   HGBUR MODERATE (A) 08/16/2019 0930   BILIRUBINUR MODERATE (A) 08/16/2019 0930   KETONESUR NEGATIVE 08/16/2019 0930   PROTEINUR 30 (A) 08/16/2019 0930   NITRITE NEGATIVE 08/16/2019 0930   LEUKOCYTESUR SMALL (A) 08/16/2019 0930   Sepsis Labs Invalid input(s): PROCALCITONIN,  WBC,  LACTICIDVEN Microbiology Recent Results (from the past 240 hour(s))  Novel  Coronavirus, NAA [facility]  order, Send-out to Ref Lab; TAT 18-24 hrs     Status: None   Collection Time: 09/11/19  3:15 PM   Specimen: Nasopharyngeal Swab; Respiratory  Result Value Ref Range Status   SARS-CoV-2, NAA NOT DETECTED NOT DETECTED Final    Comment: (NOTE) This nucleic acid amplification test was developed and its performance characteristics determined by [employer]. Nucleic acid amplification tests include PCR and TMA. This test has not been FDA cleared or approved. This test has been authorized by FDA under an Emergency Use Authorization (EUA). This test is only authorized for the duration of time the declaration that circumstances exist justifying the authorization of the emergency use of in vitro diagnostic tests for detection of SARS-CoV-2 virus and/or diagnosis of COVID-19 infection under section 564(b)(1) of the Act, 21 U.S.C. 098JXB-1(Y) (1), unless the authorization is terminated or revoked sooner. When diagnostic testing is negative, the possibility of a false negative result should be considered in the context of a patient's recent exposures and the presence of clinical signs and symptoms consistent with COVID-19. An individual without symptoms of COVID- 19 and who is not shedding SARS-CoV-2 vi rus would expect to have a negative (not detected) result in this assay. Performed At: [facility] [address] [place], [place] [id number] Jolene Schimke MD YQ:[id number]    Coronavirus Source NASOPHARYNGEAL  Final    Comment: Performed at [facility], [address], [place], [place] [address]     Time coordinating discharge: Over 30 minutes  SIGNED:   Hughie Closs, MD  [facility] 09/13/2019, 11:08 AM  If 7PM-7AM, please contact night-coverage [email or web address] Password [email or web address]

## 2019-09-15 NOTE — Progress Notes (Deleted)
HISTORY AND PHYSICAL     CC:  follow up Requesting Provider:  No ref. provider found  HPI: Briana Andrews is a 53 y.o. (Jun 23, 1966) female who presents after having BLE ischemia and undergoing aortogram with mechanical thrombectomy of the left popliteal and PTA usuing Penumbra CAT 6 device, intra arterial nitroglycerin on 08/20/2019 by Dr. Chestine Spore and the same on the right by Dr. Myra Gianotti on 08/19/2019.  She presents today for follow up.    She had a complicated hospital hx with right frontal CVA, bilateral popliteal occlusion, hepatic failure, GIB and new cardiomyopathy with an LV thrombus, cirrhosis and ARF.  She was also found to have extensive CAD and non-ischemic cardiomyopathy likely due to [facility].   She was discharged on Coumadin for acute RLE DVT and LV thrombus that embolized to legs and brain.   ABI's on 9/24 were: Right:  0.59 Left:  0.56 Biphasic waveforms bilaterally   The pt is not on a statin for cholesterol management.  The pt is not on a daily aspirin.   Other AC:  Coumadin The pt is on BB, ARB for hypertension.   The pt is not diabetic.   Tobacco hx:  ***  Past Medical History:  Diagnosis Date  . Alcohol abuse   . Tobacco abuse     Past Surgical History:  Procedure Laterality Date  . ABDOMINAL AORTOGRAM W/LOWER EXTREMITY N/A 08/19/2019   Procedure: ABDOMINAL AORTOGRAM W/LOWER EXTREMITY;  Surgeon: Nada Libman, MD;  Location: [facility] INVASIVE CV LAB;  Service: Cardiovascular;  Laterality: N/A;  . ABDOMINAL AORTOGRAM W/LOWER EXTREMITY Bilateral 08/20/2019   Procedure: ABDOMINAL AORTOGRAM W/LOWER EXTREMITY;  Surgeon: Cephus Shelling, MD;  Location: [facility] INVAS[facility] CV [facility];  Service: Cardiovascular;  Laterality: Bilateral;  . ESOPHAGOGASTRODUODENOSCOPY N/A 08/26/2019   Procedure: ESOPHAGOGASTRODUODENOSCOPY (EGD);  Surgeon: Charlott Rakes, MD;  Location: [facility] ENDOSCOPY;  Service: Endoscopy;  Laterality: N/A;  . HOT HEMOSTASIS N/A 08/26/2019   Procedure: HOT HEMOSTASIS (ARGON  PLASMA COAGULATION/BICAP);  Surgeon: Charlott Rakes, MD;  Location: [facility] ENDOSCOPY;  Service: Endoscopy;  Laterality: N/A;  . PERIPHERAL VASCULAR BALLOON ANGIOPLASTY  08/19/2019   Procedure: PERIPHERAL VASCULAR BALLOON ANGIOPLASTY;  Surgeon: Nada Libman, MD;  Location: [facility];  Service: Cardiovascular;;  Right femoral popliteal and PT   . PERIPHERAL VASCULAR INTERVENTION Left 08/20/2019   Procedure: PERIPHERAL VASCULAR INTERVENTION;  Surgeon: Cephus Shelling, MD;  Location: [facility] INVASIVE CV LAB;  Service: Cardiovascular;  Laterality: Left;  . RIGHT/LEFT HEART CATH AND CORONARY ANGIOGRAPHY N/A 09/02/2019   Procedure: RIGHT/LEFT HEART CATH AND CORONARY ANGIOGRAPHY;  Surgeon: Laurey Morale, MD;  Location: [facility] INVASIVE CV LAB;  Service: Cardiovascular;  Laterality: N/A;  . SCLEROTHERAPY  08/26/2019   Procedure: SCLEROTHERAPY;  Surgeon: Charlott Rakes, MD;  Location: [facility] ENDOSCOPY;  Service: Endoscopy;;    Social History   Socioeconomic History  . Marital status: Married    Spouse name: Not on file  . Number of children: Not on file  . Years of education: Not on file  . Highest education level: Not on file  Occupational History  . Not on file  Social Needs  . Financial resource strain: Not on file  . Food insecurity    Worry: Not on file    Inability: Not on file  . Transportation needs    Medical: Not on file    Non-medical: Not on file  Tobacco Use  . Smoking status: Current Every Day Smoker  . Smokeless tobacco: Never Used  Substance and  Sexual Activity  . Alcohol use: Yes  . Drug use: Yes    Types: Marijuana  . Sexual activity: Not on file  Lifestyle  . Physical activity    Days per week: Not on file    Minutes per session: Not on file  . Stress: Not on file  Relationships  . Social Musician on phone: Not on file    Gets together: Not on file    Attends religious service: Not on file    Active member of club or organization: Not on file     Attends meetings of clubs or organizations: Not on file    Relationship status: Not on file  . Intimate partner violence    Fear of current or ex partner: Not on file    Emotionally abused: Not on file    Physically abused: Not on file    Forced sexual activity: Not on file  Other Topics Concern  . Not on file  Social History Narrative  . Not on file    Family History  Problem Relation Age of Onset  . Heart disease Mother     Current Outpatient Medications  Medication Sig Dispense Refill  . carvedilol (COREG) 3.125 MG tablet Take 1 tablet (3.125 mg total) by mouth 2 (two) times daily with a meal. 60 tablet 0  . digoxin (LANOXIN) 0.125 MG tablet Take 1 tablet (0.125 mg total) by mouth every other day. 15 tablet 0  . folic acid (FOLVITE) 1 MG tablet Take 1 tablet (1 mg total) by mouth daily. 30 tablet 0  . furosemide (LASIX) 40 MG tablet Take 1 tablet (40 mg total) by mouth daily. 30 tablet 0  . gabapentin (NEURONTIN) 100 MG capsule Take 2 capsules (200 mg total) by mouth 2 (two) times daily. 120 capsule 0  . ibuprofen (ADVIL) 200 MG tablet Take 200 mg by mouth every 6 (six) hours as needed for mild pain.    Marland Kitchen losartan (COZAAR) 50 MG tablet Take 1 tablet (50 mg total) by mouth daily. 30 tablet 0  . Multiple Vitamin (MULTIVITAMIN WITH MINERALS) TABS tablet Take 1 tablet by mouth daily. 30 tablet 0  . pantoprazole (PROTONIX) 40 MG tablet Take 1 tablet (40 mg total) by mouth daily. 30 tablet 0  . rifaximin (XIFAXAN) 550 MG TABS tablet Take 1 tablet (550 mg total) by mouth 2 (two) times daily. 60 tablet 0  . spironolactone (ALDACTONE) 25 MG tablet Take 1 tablet (25 mg total) by mouth at bedtime. 30 tablet 0  . thiamine 100 MG tablet Take 1 tablet (100 mg total) by mouth daily. 30 tablet 0  . warfarin (COUMADIN) 2.5 MG tablet Take 1 tablet (2.5 mg total) by mouth daily. 30 tablet 0   No current facility-administered medications for this visit.     Allergies  Allergen Reactions  .  Penicillins Other (See Comments)    Unsure of reaction Did it involve swelling of the face/tongue/throat, SOB, or low BP? Unknown Did it involve sudden or severe rash/hives, skin peeling, or any reaction on the inside of your mouth or nose? Unknown Did you need to seek medical attention at a hospital or doctor's office? Unknown When did it last happen?[place] If all above answers are "NO", may proceed with cephalosporin use.     REVIEW OF SYSTEMS:  *** [X]  denotes positive finding, [ ]  denotes negative finding Cardiac  Comments:  Chest pain or chest pressure:    Shortness of breath upon  exertion:    Short of breath when lying flat:    Irregular heart rhythm:        Vascular    Pain in calf, thigh, or hip brought on by ambulation:    Pain in feet at night that wakes you up from your sleep:     Blood clot in your veins:    Leg swelling:         Pulmonary    Oxygen at home:    Productive cough:     Wheezing:         Neurologic    Sudden weakness in arms or legs:     Sudden numbness in arms or legs:     Sudden onset of difficulty speaking or slurred speech:    Temporary loss of vision in one eye:     Problems with dizziness:         Gastrointestinal    Blood in stool:     Vomited blood:         Genitourinary    Burning when urinating:     Blood in urine:        Psychiatric    Major depression:         Hematologic    Bleeding problems:    Problems with blood clotting too easily:        Skin    Rashes or ulcers:        Constitutional    Fever or chills:      PHYSICAL EXAMINATION:  ***  General:  WDWN in NAD; vital signs documented above Gait: Not observed HENT: WNL, normocephalic Pulmonary: normal non-labored breathing , without Rales, rhonchi,  wheezing Cardiac: {Desc; regular/irreg:14544} HR, without  Murmurs {With/Without:20273} carotid bruit*** Abdomen: soft, NT, no masses Skin: {With/Without:20273} rashes Vascular Exam/Pulses:  Right Left   Radial {Exam; arterial pulse strength 0-4:30167} {Exam; arterial pulse strength 0-4:30167}  Ulnar {Exam; arterial pulse strength 0-4:30167} {Exam; arterial pulse strength 0-4:30167}  Femoral {Exam; arterial pulse strength 0-4:30167} {Exam; arterial pulse strength 0-4:30167}  Popliteal {Exam; arterial pulse strength 0-4:30167} {Exam; arterial pulse strength 0-4:30167}  DP {Exam; arterial pulse strength 0-4:30167} {Exam; arterial pulse strength 0-4:30167}  PT {Exam; arterial pulse strength 0-4:30167} {Exam; arterial pulse strength 0-4:30167}   Extremities: {With/Without:20273} ischemic changes, {With/Without:20273} Gangrene , {With/Without:20273} cellulitis; {With/Without:20273} open wounds;  Musculoskeletal: no muscle wasting or atrophy  Neurologic: A&O X 3;  No focal weakness or paresthesias are detected Psychiatric:  The pt has {Desc; normal/abnormal:11317::"Normal"} affect.   Non-Invasive Vascular Imaging:   ABI's 08/21/2019: Right:  0.59 (B) Left:  0.56 (B)    ASSESSMENT/PLAN:: 53 y.o. female here for follow up for aortogram with mechanical thrombectomy of the left popliteal and PTA usuing Penumbra CAT 6 device, intra arterial nitroglycerin on 08/20/2019 by Dr. Carlis Abbott and the same on the right by Dr. Trula Slade on 08/19/2019.  She presents today for follow up.      -***   Leontine Locket, PA-C [facility] [phone]  Clinic MD:   Carlis Abbott

## 2019-09-16 ENCOUNTER — Ambulatory Visit: Payer: 59

## 2019-09-16 ENCOUNTER — Other Ambulatory Visit: Payer: Self-pay

## 2019-09-21 ENCOUNTER — Other Ambulatory Visit: Payer: Self-pay

## 2019-09-21 ENCOUNTER — Emergency Department: Payer: 59

## 2019-09-21 ENCOUNTER — Emergency Department
Admission: EM | Admit: 2019-09-21 | Discharge: 2019-09-21 | Disposition: A | Discharge status: Home / Self Care | Payer: 59 | Attending: Emergency Medicine | Admitting: Emergency Medicine

## 2019-09-21 DIAGNOSIS — F17200 Nicotine dependence, unspecified, uncomplicated: Secondary | ICD-10-CM | POA: Insufficient documentation

## 2019-09-21 DIAGNOSIS — S0101XA Laceration without foreign body of scalp, initial encounter: Secondary | ICD-10-CM

## 2019-09-21 DIAGNOSIS — S0990XA Unspecified injury of head, initial encounter: Secondary | ICD-10-CM

## 2019-09-21 DIAGNOSIS — Y92122 Bedroom in nursing home as the place of occurrence of the external cause: Secondary | ICD-10-CM | POA: Insufficient documentation

## 2019-09-21 DIAGNOSIS — Y9389 Activity, other specified: Secondary | ICD-10-CM | POA: Diagnosis not present

## 2019-09-21 DIAGNOSIS — I5022 Chronic systolic (congestive) heart failure: Secondary | ICD-10-CM | POA: Insufficient documentation

## 2019-09-21 DIAGNOSIS — W1839XA Other fall on same level, initial encounter: Secondary | ICD-10-CM | POA: Diagnosis not present

## 2019-09-21 DIAGNOSIS — Y999 Unspecified external cause status: Secondary | ICD-10-CM | POA: Diagnosis not present

## 2019-09-21 NOTE — ED Notes (Signed)
Dressing applied to head laceration.  

## 2019-09-21 NOTE — ED Provider Notes (Signed)
[facility] Emergency Department Provider Note   ____________________________________________    I have reviewed the triage vital signs and the nursing notes.   HISTORY  Chief Complaint Fall and Laceration     HPI Briana Andrews is a 53 y.o. female who presents after a fall with a head injury.  Patient reports she was packing her closet, lost her balance with her walker and fell and struck the back of her head on the floor.  She denies LOC.  No nausea or vomiting.  No neuro deficits.  Denies neck pain chest pain back pain abdominal pain.  No extremity injuries.  She is on Coumadin.   Past Medical History:  Diagnosis Date  . Alcohol abuse   . Tobacco abuse     Patient Active Problem List   Diagnosis Date Noted  . Endotracheally intubated   . Acute on chronic systolic CHF (congestive heart failure) (HCC)   . GI bleed 08/26/2019  . Pressure injury of skin 08/23/2019  . Acute upper GI bleed   . Shock circulatory (HCC)   . Palliative care by specialist   . Goals of care, counseling/discussion   . Advanced care planning/counseling discussion   . Jaundice   . Cerebral embolism with cerebral infarction 08/17/2019  . PVD (peripheral vascular disease) (HCC) 08/17/2019  . 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 Acute CHF (congestive heart failure) (HCC) 08/17/2019  . Abnormal LFTs 08/16/2019  . Acute metabolic encephalopathy 08/16/2019  . SOB (shortness of breath) 08/16/2019  . AKI (acute kidney injury) (HCC) 08/16/2019  . Fall 08/16/2019  . Lactic acidosis 08/16/2019  . Back pain 08/16/2019  . Liver failure without hepatic coma (HCC) 08/16/2019  . Multifocal pneumonia 08/16/2019  . Alcohol abuse   . Tobacco abuse   . Shortness of breath     Past Surgical History:  Procedure Laterality Date  . ABDOMINAL AORTOGRAM W/LOWER EXTREMITY N/A 08/19/2019   Procedure: ABDOMINAL AORTOGRAM W/LOWER EXTREMITY;  Surgeon: Nada Libman, MD;  Location: [facility] INVASIVE CV LAB;  Service: Cardiovascular;  Laterality: N/A;  . ABDOMINAL AORTOGRAM W/LOWER EXTREMITY Bilateral 08/20/2019   Procedure: ABDOMINAL AORTOGRAM W/LOWER EXTREMITY;  Surgeon: Cephus Shelling, MD;  Location: [facility] INVASIVE CV LAB;  Service: Cardiovascular;  Laterality: Bilateral;  . ESOPHAGOGASTRODUODENOSCOPY N/A 08/26/2019   Procedure: ESOPHAGOGASTRODUODENOSCOPY (EGD);  Surgeon: Charlott Rakes, MD;  Location: [facility] ENDOSCOPY;  Service: Endoscopy;  Laterality: N/A;  . HOT HEMOSTASIS N/A 08/26/2019   Procedure: HOT HEMOSTASIS (ARGON PLASMA COAGULATION/BICAP);  Surgeon: Charlott Rakes, MD;  Location: [facility] ENDOSCOPY;  Service: Endoscopy;  Laterality: N/A;  . PERIPHERAL VASCULAR BALLOON ANGIOPLASTY  08/19/2019   Procedure: PERIPHERAL VASCULAR BALLOON ANGIOPLASTY;  Surgeon: Nada Libman, MD;  Location: [facility] INVASIVE CV LAB;  Service: Cardiovascular;;  Right femoral popliteal and PT   . PERIPHERAL VASCULAR INTERVENTION Left 08/20/2019   Procedure: PERIPHERAL VASCULAR INTERVENTION;  Surgeon: Cephus Shelling, MD;  Location: [facility] INVASIVE CV LAB;  Service: Cardiovascular;  Laterality: Left;  . RIGHT/LEFT HEART CATH AND CORONARY ANGIOGRAPHY N/A 09/02/2019   Procedure: RIGHT/LEFT HEART CATH AND CORONARY ANGIOGRAPHY;  Surgeon: Laurey Morale, MD;  Location: [facility] INVASIVE CV LAB;  Service: Cardiovascular;  Laterality: N/A;  . SCLEROTHERAPY  08/26/2019   Procedure: SCLEROTHERAPY;  Surgeon: Charlott Rakes, MD;  Location: [facility] ENDOSCOPY;  Service: Endoscopy;;    Prior to Admission medications   Medication Sig Start Date End Date Taking? Authorizing Provider  carvedilol (COREG) 3.125 MG tablet Take 1  tablet (3.125 mg total) by mouth 2 (two) times daily with a meal. 09/13/19 10/13/19  Darliss Cheney, MD  digoxin (LANOXIN) 0.125 MG tablet Take 1 tablet (0.125 mg total) by mouth every other day. 09/13/19 10/13/19  Darliss Cheney, MD  folic acid (FOLVITE) 1 MG tablet Take 1 tablet  (1 mg total) by mouth daily. 09/13/19 10/13/19  Darliss Cheney, MD  furosemide (LASIX) 40 MG tablet Take 1 tablet (40 mg total) by mouth daily. 09/13/19 10/13/19  Darliss Cheney, MD  gabapentin (NEURONTIN) 100 MG capsule Take 2 capsules (200 mg total) by mouth 2 (two) times daily. 09/13/19 10/13/19  Darliss Cheney, MD  ibuprofen (ADVIL) 200 MG tablet Take 200 mg by mouth every 6 (six) hours as needed for mild pain.    [provider]  losartan (COZAAR) 50 MG tablet Take 1 tablet (50 mg total) by mouth daily. 09/13/19 10/13/19  Darliss Cheney, MD  Multiple Vitamin (MULTIVITAMIN WITH MINERALS) TABS tablet Take 1 tablet by mouth daily. 09/13/19 10/13/19  Darliss Cheney, MD  pantoprazole (PROTONIX) 40 MG tablet Take 1 tablet (40 mg total) by mouth daily. 09/13/19 10/13/19  Darliss Cheney, MD  rifaximin (XIFAXAN) 550 MG TABS tablet Take 1 tablet (550 mg total) by mouth 2 (two) times daily. 09/13/19 10/13/19  Darliss Cheney, MD  spironolactone (ALDACTONE) 25 MG tablet Take 1 tablet (25 mg total) by mouth at bedtime. 09/13/19 10/13/19  Darliss Cheney, MD  thiamine 100 MG tablet Take 1 tablet (100 mg total) by mouth daily. 09/13/19 10/13/19  Darliss Cheney, MD  warfarin (COUMADIN) 2.5 MG tablet Take 1 tablet (2.5 mg total) by mouth daily. 09/13/19 10/13/19  Darliss Cheney, MD     Allergies Penicillins  Family History  Problem Relation Age of Onset  . Heart disease Mother     Social History Social History   Tobacco Use  . Smoking status: Current Every Day Smoker  . Smokeless tobacco: Never Used  Substance Use Topics  . Alcohol use: Yes  . Drug use: Yes    Types: Marijuana    Review of Systems  Constitutional: No dizziness Eyes: No visual changes.  ENT: No neck pain Cardiovascular: Denies chest wall pain Respiratory: Denies shortness of breath. Gastrointestinal: No abdominal pain.  No nausea, no vomiting.   Genitourinary: No groin injury Musculoskeletal: Negative for back pain. Skin:  Laceration to the scalp Neurological: Negative for  weakness   ____________________________________________   PHYSICAL EXAM:  VITAL SIGNS: ED Triage Vitals  Enc Vitals Group     BP 09/21/19 1706 101/65     Pulse Rate 09/21/19 1706 83     Resp 09/21/19 1706 (!) 25     Temp 09/21/19 1706 98 F (36.7 C)     Temp Source 09/21/19 1706 Oral     SpO2 09/21/19 1706 100 %     Weight 09/21/19 1708 68 kg (149 lb 14.6 oz)     Height 09/21/19 1708 1.651 m (5\' 5" )     Head Circumference --      Peak Flow --      Pain Score 09/21/19 1707 8     Pain Loc --      Pain Edu? --      Excl. in [place]? --     Constitutional: Alert and oriented. Eyes: Orbits are normal, no swelling or bruising Head: Approximately 1 cm vertical laceration to the crown of the scalp, no active bleeding Nose: No swelling or epistaxis Mouth/Throat: Mucous membranes are moist.   Neck:  Painless ROM, no pain with axial load, no vertebral tenderness palpation Cardiovascular: Normal rate, regular rhythm. Peri [place] peripheral circulation.  No chest wall tenderness palpation Respiratory: Normal respiratory effort.  No retractions. Gastrointestinal: Soft and nontender. No distention.    Musculoskeletal:   Warm and well perfused, no extremity injuries Neurologic:  Normal speech and language. No gross focal neurologic deficits are appreciated.  Skin:  Skin is warm, dry, see above Psychiatric: Mood and affect are normal. Speech and behavior are normal.  ____________________________________________   LABS (all labs ordered are listed, but only abnormal results are displayed)  Labs Reviewed - No data to display ____________________________________________  EKG  ED ECG REPORT I, Jene Every, the attending physician, personally viewed and interpreted this ECG.  Date: 09/21/2019  Rhythm: normal sinus rhythm QRS Axis: normal Intervals: normal ST/T Wave abnormalities: normal Narrative Interpretation: no evidence of  acute ischemia  ____________________________________________  RADIOLOGY  CT head ____________________________________________   PROCEDURES  Procedure(s) performed: yes     .Marland KitchenLaceration Repair  Date/Time: 09/21/2019 8:02 PM Performed by: Jene Every, MD Authorized by: Jene Every, MD   Consent:    Consent obtained:  Verbal   Consent given by:  Patient   Risks discussed:  Pain, retained foreign body, poor wound healing and infection Anesthesia (see MAR for exact dosages):    Anesthesia method:  None Laceration details:    Location:  Scalp   Scalp location:  Crown   Length (cm):  1 Repair type:    Repair type:  Simple Exploration:    Hemostasis achieved with:  Direct pressure   Contaminated: no   Treatment:    Area cleansed with:  Saline   Amount of cleaning:  Standard   Irrigation solution:  Sterile saline   Visualized foreign bodies/material removed: no   Skin repair:    Repair method:  Staples   Number of staples:  1 Approximation:    Approximation:  Close Post-procedure details:    Dressing:  Sterile dressing   Patient tolerance of procedure:  Tolerated well, no immediate complications     Critical Care performed: No ____________________________________________   INITIAL IMPRESSION / ASSESSMENT AND PLAN / ED COURSE  Pertinent labs & imaging results that were available during my care of the patient were reviewed by me and considered in my medical decision making (see chart for details).  Patient with mechanical fall, head injury as above, no evidence of other injuries on exam.  CT head ordered given that she is on Coumadin.  Laceration repaired with staple, removal in 5 days.  CT scan unremarkable     ____________________________________________   FINAL CLINICAL IMPRESSION(S) / ED DIAGNOSES  Final diagnoses:  Injury of head, initial encounter  Scalp laceration, initial encounter        Note:  This document was prepared using  Dragon voice recognition software and may include unintentional dictation errors.   Jene Every, MD 09/21/19 2041

## 2019-09-21 NOTE — ED Notes (Signed)
EMS to transport pt to [facility]

## 2019-09-21 NOTE — ED Triage Notes (Signed)
Pt arrives via EMS from [facility]. Pt attempted to pack belongings and leave facility and fell in the process. Pt states she was reaching in the closet and fell. Laceration to the back of head on right side. No active bleeding.  VSS

## 2019-09-21 NOTE — ED Notes (Signed)
2nd attempt to call [facility]

## 2019-09-21 NOTE — ED Notes (Signed)
Pt placed on bedpan. 500 cc urine output. Pt brief dry and replaced. CT notified pt is ready for CT after using bedpan.

## 2019-09-22 ENCOUNTER — Encounter (HOSPITAL_COMMUNITY): Payer: 59

## 2019-09-29 ENCOUNTER — Encounter (HOSPITAL_COMMUNITY): Payer: Self-pay

## 2019-09-29 ENCOUNTER — Ambulatory Visit (HOSPITAL_COMMUNITY)
Admission: RE | Admit: 2019-09-29 | Discharge: 2019-09-29 | Disposition: A | Discharge status: Home / Self Care | Payer: 59 | Source: Ambulatory Visit | Attending: Adult Health | Admitting: Adult Health

## 2019-09-29 ENCOUNTER — Other Ambulatory Visit: Payer: Self-pay

## 2019-09-29 VITALS — BP 108/59 | HR 109 | Wt 148.3 lb

## 2019-09-29 DIAGNOSIS — Z79899 Other long term (current) drug therapy: Secondary | ICD-10-CM | POA: Insufficient documentation

## 2019-09-29 DIAGNOSIS — I240 Acute coronary thrombosis not resulting in myocardial infarction: Secondary | ICD-10-CM | POA: Diagnosis not present

## 2019-09-29 DIAGNOSIS — K7460 Unspecified cirrhosis of liver: Secondary | ICD-10-CM | POA: Diagnosis not present

## 2019-09-29 DIAGNOSIS — F17200 Nicotine dependence, unspecified, uncomplicated: Secondary | ICD-10-CM | POA: Insufficient documentation

## 2019-09-29 DIAGNOSIS — I2510 Atherosclerotic heart disease of native coronary artery without angina pectoris: Secondary | ICD-10-CM | POA: Diagnosis not present

## 2019-09-29 DIAGNOSIS — R5381 Other malaise: Secondary | ICD-10-CM

## 2019-09-29 DIAGNOSIS — I5031 Acute diastolic (congestive) heart failure: Secondary | ICD-10-CM | POA: Diagnosis not present

## 2019-09-29 DIAGNOSIS — Z8249 Family history of ischemic heart disease and other diseases of the circulatory system: Secondary | ICD-10-CM | POA: Diagnosis not present

## 2019-09-29 DIAGNOSIS — K7010 Alcoholic hepatitis without ascites: Secondary | ICD-10-CM | POA: Diagnosis not present

## 2019-09-29 DIAGNOSIS — Z8719 Personal history of other diseases of the digestive system: Secondary | ICD-10-CM

## 2019-09-29 DIAGNOSIS — Z8673 Personal history of transient ischemic attack (TIA), and cerebral infarction without residual deficits: Secondary | ICD-10-CM | POA: Insufficient documentation

## 2019-09-29 DIAGNOSIS — Z7901 Long term (current) use of anticoagulants: Secondary | ICD-10-CM | POA: Insufficient documentation

## 2019-09-29 DIAGNOSIS — I5022 Chronic systolic (congestive) heart failure: Secondary | ICD-10-CM

## 2019-09-29 DIAGNOSIS — R410 Disorientation, unspecified: Secondary | ICD-10-CM | POA: Diagnosis not present

## 2019-09-29 DIAGNOSIS — K7040 Alcoholic hepatic failure without coma: Secondary | ICD-10-CM | POA: Diagnosis not present

## 2019-09-29 DIAGNOSIS — F1011 Alcohol abuse, in remission: Secondary | ICD-10-CM | POA: Diagnosis not present

## 2019-09-29 LAB — CBC
HCT: 31.4 % — ABNORMAL LOW (ref 36.0–46.0)
Hemoglobin: 10.2 g/dL — ABNORMAL LOW (ref 12.0–15.0)
MCH: 31.8 pg (ref 26.0–34.0)
MCHC: 32.5 g/dL (ref 30.0–36.0)
MCV: 97.8 fL (ref 80.0–100.0)
Platelets: 680 10*3/uL — ABNORMAL HIGH (ref 150–400)
RBC: 3.21 MIL/uL — ABNORMAL LOW (ref 3.87–5.11)
RDW: 16.4 % — ABNORMAL HIGH (ref 11.5–15.5)
WBC: 8.3 10*3/uL (ref 4.0–10.5)
nRBC: 0 % (ref 0.0–0.2)

## 2019-09-29 LAB — BASIC METABOLIC PANEL
Anion gap: 11 (ref 5–15)
BUN: 13 mg/dL (ref 6–20)
CO2: 24 mmol/L (ref 22–32)
Calcium: 8.8 mg/dL — ABNORMAL LOW (ref 8.9–10.3)
Chloride: 94 mmol/L — ABNORMAL LOW (ref 98–111)
Creatinine, Ser: 1 mg/dL (ref 0.44–1.00)
GFR calc Af Amer: >60 mL/min (ref >60)
GFR calc non Af Amer: >60 mL/min (ref >60)
Glucose, Bld: 136 mg/dL — ABNORMAL HIGH (ref 70–99)
Potassium: 3.5 mmol/L (ref 3.5–5.1)
Sodium: 129 mmol/L — ABNORMAL LOW (ref 135–145)

## 2019-09-29 LAB — PROTIME-INR
INR: 2 — ABNORMAL HIGH (ref 0.8–1.2)
Prothrombin Time: 22.8 seconds — ABNORMAL HIGH (ref 11.4–15.2)

## 2019-09-29 MED ORDER — FOLIC ACID 1 MG PO TABS: 1.0000 mg | ORAL_TABLET | Freq: Every day | ORAL | 5 refills | Status: AC

## 2019-09-29 MED ORDER — ROSUVASTATIN CALCIUM 5 MG PO TABS: 5.0000 mg | ORAL_TABLET | Freq: Every day | ORAL | 3 refills | Status: DC

## 2019-09-29 MED ORDER — WARFARIN SODIUM 2.5 MG PO TABS: 2.5000 mg | ORAL_TABLET | Freq: Every day | ORAL | 5 refills | Status: DC

## 2019-09-29 MED ORDER — FUROSEMIDE 20 MG PO TABS: 20.0000 mg | ORAL_TABLET | Freq: Every day | ORAL | 6 refills | Status: DC

## 2019-09-29 MED ORDER — PANTOPRAZOLE SODIUM 40 MG PO TBEC: 40.0000 mg | DELAYED_RELEASE_TABLET | Freq: Every day | ORAL | 5 refills | Status: DC

## 2019-09-29 MED ORDER — DIGOXIN 125 MCG PO TABS: 0.1250 mg | ORAL_TABLET | ORAL | 5 refills | Status: DC

## 2019-09-29 NOTE — Progress Notes (Signed)
PCP: None  Primary Cardiologist: Dr Shirlee Latch Vascular : Dr Darrick Penna   HPI: Briana Andrews is a 53 yo alcoholic woman with a history of R frontal CVA, B popliteal artery occlusions, hepatic failure, GI bleeding, cirrhosis, ETOH abuse, and chronic systolic heart failure.  Admitted 08/15/19 with increased sob and confusion. Treated for PNA and MRI brain showed R ACA stroke. Hospital course complicated new onset acute systolic heart failure likely in the setting of ETOH abuse. Hospital course complicated by LV thrombus, cirrhosis, ARF, lower extremity thrombus, and shock.  Received PRBC and was able to undergo EGD 9/29 that showed duodenal and gastric ulcers which were treated endoscopically. Continued on PPI. Heparin has been converted to coumadin. R foot wound was to be followed by Vascular in the community  Discharged to SNF. On 09/21/19 she presented to the [facility] after a fall that resulted in head laceration. CT scan was unremarkable. She left SNF on 09/27/19 because she was not taken to any of her follow up appointments.  She asked for a copy of her medications however she said they did not give it to her.      Today she returns for HF follow up.Overall feeling fine. Denies SOB/PND/Orthopnea. Appetite ok. No fever or chills. Unable to stand to weigh. Taking a few steps at home with her walker. R foot dressing completed by her husband. Unable to weigh at home. Taking all medications. She has not been smoking or drinking alcohol.   1. Systolic Heart Failure  9/20 ECHO EF 15-20% Fixed thrombus  LHC 09/02/19 LAD 40%, Left Circumflex 50% , RCA 80% Proximal RCA 60% mid PDA stenosis RHC 10/6/320 RA 6 CO 7 CI 3.7 PVR 3.7  2. GI Bleed 3. R frontal CVA 4. Bilateral Popliteal Occlusions  S/P Thrombectomy  5. Hepatic Failure  6. ETOH Abuse  7. Liver Failure 8. LV thrombus    ROS: All systems negative except as listed in HPI, PMH and Problem List.  SH:  Social History   Socioeconomic History  . Marital  status: Married    Spouse name: Not on file  . Number of children: Not on file  . Years of education: Not on file  . Highest education level: Not on file  Occupational History  . Not on file  Social Needs  . Financial resource strain: Not on file  . Food insecurity    Worry: Not on file    Inability: Not on file  . Transportation needs    Medical: Not on file    Non-medical: Not on file  Tobacco Use  . Smoking status: Current Every Day Smoker  . Smokeless tobacco: Never Used  Substance and Sexual Activity  . Alcohol use: Yes  . Drug use: Yes    Types: Marijuana  . Sexual activity: Not on file  Lifestyle  . Physical activity    Days per week: Not on file    Minutes per session: Not on file  . Stress: Not on file  Relationships  . Social Musician on phone: Not on file    Gets together: Not on file    Attends religious service: Not on file    Active member of club or organization: Not on file    Attends meetings of clubs or organizations: Not on file    Relationship status: Not on file  . Intimate partner violence    Fear of current or ex partner: Not on file    Emotionally abused: Not  on file    Physically abused: Not on file    Forced sexual activity: Not on file  Other Topics Concern  . Not on file  Social History Narrative  . Not on file    FH:  Family History  Problem Relation Age of Onset  . Heart disease Mother     Past Medical History:  Diagnosis Date  . Alcohol abuse   . Tobacco abuse     Current Outpatient Medications  Medication Sig Dispense Refill  . carvedilol (COREG) 3.125 MG tablet Take 1 tablet (3.125 mg total) by mouth 2 (two) times daily with a meal. (Patient not taking: Reported on 09/29/2019) 60 tablet 0  . digoxin (LANOXIN) 0.125 MG tablet Take 1 tablet (0.125 mg total) by mouth every other day. (Patient not taking: Reported on 09/29/2019) 15 tablet 0  . folic acid (FOLVITE) 1 MG tablet Take 1 tablet (1 mg total) by mouth  daily. (Patient not taking: Reported on 09/29/2019) 30 tablet 0  . furosemide (LASIX) 40 MG tablet Take 1 tablet (40 mg total) by mouth daily. (Patient not taking: Reported on 09/29/2019) 30 tablet 0  . gabapentin (NEURONTIN) 100 MG capsule Take 2 capsules (200 mg total) by mouth 2 (two) times daily. (Patient not taking: Reported on 09/29/2019) 120 capsule 0  . ibuprofen (ADVIL) 200 MG tablet Take 200 mg by mouth every 6 (six) hours as needed for mild pain.    Marland Kitchen losartan (COZAAR) 50 MG tablet Take 1 tablet (50 mg total) by mouth daily. (Patient not taking: Reported on 09/29/2019) 30 tablet 0  . Multiple Vitamin (MULTIVITAMIN WITH MINERALS) TABS tablet Take 1 tablet by mouth daily. (Patient not taking: Reported on 09/29/2019) 30 tablet 0  . pantoprazole (PROTONIX) 40 MG tablet Take 1 tablet (40 mg total) by mouth daily. (Patient not taking: Reported on 09/29/2019) 30 tablet 0  . rifaximin (XIFAXAN) 550 MG TABS tablet Take 1 tablet (550 mg total) by mouth 2 (two) times daily. (Patient not taking: Reported on 09/29/2019) 60 tablet 0  . spironolactone (ALDACTONE) 25 MG tablet Take 1 tablet (25 mg total) by mouth at bedtime. (Patient not taking: Reported on 09/29/2019) 30 tablet 0  . thiamine 100 MG tablet Take 1 tablet (100 mg total) by mouth daily. (Patient not taking: Reported on 09/29/2019) 30 tablet 0  . warfarin (COUMADIN) 2.5 MG tablet Take 1 tablet (2.5 mg total) by mouth daily. (Patient not taking: Reported on 09/29/2019) 30 tablet 0   No current facility-administered medications for this encounter.     Vitals:   09/29/19 1416  BP: (!) 108/59  Pulse: (!) 109  SpO2: 100%  Weight: 67.3 kg (148 lb 4.8 oz)   Wt Readings from Last 3 Encounters:  09/29/19 67.3 kg (148 lb 4.8 oz)  09/21/19 68 kg (149 lb 14.6 oz)  09/13/19 68.1 kg (150 lb 2.1 oz)    PHYSICAL EXAM: General:  No resp difficulty. Arrived in a wheel chair HEENT: normal Neck: supple. JVP flat. Carotids 2+ bilaterally; no bruits. No  lymphadenopathy or thryomegaly appreciated. Cor: PMI normal. Regular rate & rhythm. No rubs, gallops or murmurs. Lungs: clear Abdomen: soft, nontender, nondistended. No hepatosplenomegaly. No bruits or masses. Good bowel sounds. Extremities: no cyanosis, clubbing, rash, edema Neuro: alert & orientedx3, cranial nerves grossly intact. Moves all 4 extremities w/o difficulty. Affect pleasant. Skin: Warm, R Foot Dorsum large eschar with some purulent exudate noted. + odor. 1 Staple on the back of her head.  ECG: NSR 99 bpm    ASSESSMENT & PLAN: 1. Chronic systolic CHF Recent cardiogenic shock: Echo 07/2019 with EF 15-20%, mild LV dilation, LV thrombus, mild-moderate MR, severely decreased RV systolic function.  LHC/RHC done 10/6 showing normal filling pressures and preserved cardiac output.  There was diffuse coronary disease, especially involving distal and branch vessels, only possible interventional target was 80% proximal RCA stenosis.  No intervention at this time given good flow down the vessel and unlikely to significantly improve LV function.  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   NYHA II.  Volume status stable. Continue Lasix 20 mg po daily.  - Continue digoxin 0.125.   - Not candidate for advanced therapies with ETOH abuse/liver failure.  2. Liver failure: ETOH hepatitis. Markedly elevated bilirubin initially with jaundice. However liver imaging with [place] and CT has NOT shown cirrhosis or portal/hepatic vein thrombosis. Question has arisen whether this might possibly be due primarily to RV failure. Time course seems quite acute for RV failure. Needs follow up with GI.    -restart protonix 40 mg daily.  3.  LV thrombus: Possible cause of embolic event to popliteal arteries as well as subacute CVA.  -Restart coumadin 2.5 mg daily.  Referred to Coumadin Clinic in [place].  6. PAD: Bilateral popliteal artery  occlusions, suspect cardioembolic from LV thrombus. Now s/p mechanical thrombectomy by vascular. R Foot Dorsum with thick black eschar. + Odor + purulent exudate.  - Continue anticoagulation.  7. 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  - Continue anticoagulation.  8. Right popliteal vein DVT -Restart coumadin today. .  9. H/O GI bleeding: Upper GI bleeding, EGD with bleeding duodenal and gastric ulcer on 9/29 that were treated, no varices. She had 1 unit PRBCs on 9/30 and 1 unit on 10/2.  - Continue Protonix.  -Check CBC  10. H/O ETOH abuse: Per husband, at least a 6 pack daily and usually more prior to admission. Plans to quit.  11. Deconditioning:  Referred to HHPT  12. CAD: Extensive distal and branch vessel coronary disease on cath 10/6.  Only possible interventional target was 80% proximal RCA stenosis.  However, there is good flow down the RCA and I do not think intervention on this lesion would appreciably improve her LV function.   -No chest pain.  -Restart Crestor 5 mg daily.   Today I am restarting crestor, coumadin, digoxin, protonix, and folic acid.  - We called [facility] for follow up. I am concerned about her R foot. Anticipate full thickness skin loss. She understands down the round she may need amputation.\   - Referred to Coumadin Clinic in [place] - Referred to Dr Darrick [facility] in [place]  - Referred to HHRN/HHPT.   Check CBC, BMET,INR today.   Follow up in 2 weeks.  Greater than 50% of the (total minutes 60) visit spent in counseling/coordination of care regarding the above.   Briana Hoheisel NP-C  2:36 PM

## 2019-09-29 NOTE — Patient Instructions (Addendum)
START Digoxin 0.125mg  (1 tab) daily  START Lasix (Furosemide) 20mg  (1 tab) daily  START Protonix 40mg  (1 tab) daily  START Folic Acid 1mg  (1 tab) daily  START Warfarin (Coumadin) 2.5mg  (1 tab) at night  START Crestor 5mg  (1 tab) at night  You have an appointment with the Coumadin Clinic in [place] on November 4th, 2020 at 8:15a  You have an appointment with the Vascular Vein Specialist November 4th, 2020 at 1:15pm  You have been referred to the Internal Medicine clinic for follow.  You will receive a call to schedule this appointment.    You have been referred to [facility] for Nursing services as well as physical therapy.  You will get a call to schedule this appointment.   Labs today We will only contact you if something comes back abnormal or we need to make some changes. Otherwise no news is good news!  Your physician recommends that you schedule a follow-up appointment in: 2 weeks with Nurse Practitioner  At the [facility], you and your health needs are our priority. As part of our continuing mission to provide you with exceptional heart care, we have created designated Provider Care Teams. These Care Teams include your primary Cardiologist (physician) and Advanced Practice Providers (APPs- Physician Assistants and Nurse Practitioners) who all work together to provide you with the care you need, when you need it.   You may see any of the following providers on your designated Care Team at your next follow up: Marland Kitchen Dr Glori Bickers . Dr Loralie Champagne . Darrick Grinder, NP . Lyda Jester, PA   Please be sure to bring in all your medications bottles to every appointment.

## 2019-09-30 ENCOUNTER — Telehealth (HOSPITAL_COMMUNITY): Payer: Self-pay

## 2019-09-30 NOTE — Telephone Encounter (Signed)
Pt referred to [facility] for PT and nursing as [facility] could not provide services to patient at this time.  All supporting documents faxed to [facility] [place] at [phone].  They will review referral and let us know if they cannot service patient.

## 2019-10-01 ENCOUNTER — Other Ambulatory Visit: Payer: Self-pay

## 2019-10-01 ENCOUNTER — Ambulatory Visit (INDEPENDENT_AMBULATORY_CARE_PROVIDER_SITE_OTHER)
Admission: RE | Admit: 2019-10-01 | Discharge: 2019-10-01 | Disposition: A | Discharge status: Home / Self Care | Payer: 59 | Source: Ambulatory Visit | Attending: Family | Admitting: Family

## 2019-10-01 ENCOUNTER — Other Ambulatory Visit (HOSPITAL_COMMUNITY): Payer: Self-pay | Admitting: Family

## 2019-10-01 ENCOUNTER — Ambulatory Visit (HOSPITAL_COMMUNITY)
Admission: RE | Admit: 2019-10-01 | Discharge: 2019-10-01 | Disposition: A | Discharge status: Home / Self Care | Payer: 59 | Source: Ambulatory Visit | Attending: Family | Admitting: Family

## 2019-10-01 ENCOUNTER — Ambulatory Visit (INDEPENDENT_AMBULATORY_CARE_PROVIDER_SITE_OTHER): Payer: 59

## 2019-10-01 ENCOUNTER — Ambulatory Visit (INDEPENDENT_AMBULATORY_CARE_PROVIDER_SITE_OTHER): Payer: Self-pay | Admitting: Family

## 2019-10-01 ENCOUNTER — Encounter: Payer: Self-pay | Admitting: Family

## 2019-10-01 VITALS — BP 94/72 | HR 80 | Temp 97.9°F | Resp 12 | Ht 65.0 in | Wt 148.0 lb

## 2019-10-01 DIAGNOSIS — Z87891 Personal history of nicotine dependence: Secondary | ICD-10-CM

## 2019-10-01 DIAGNOSIS — I240 Acute coronary thrombosis not resulting in myocardial infarction: Secondary | ICD-10-CM | POA: Diagnosis not present

## 2019-10-01 DIAGNOSIS — L97919 Non-pressure chronic ulcer of unspecified part of right lower leg with unspecified severity: Secondary | ICD-10-CM | POA: Diagnosis present

## 2019-10-01 DIAGNOSIS — Z5181 Encounter for therapeutic drug level monitoring: Secondary | ICD-10-CM | POA: Diagnosis not present

## 2019-10-01 DIAGNOSIS — L97519 Non-pressure chronic ulcer of other part of right foot with unspecified severity: Secondary | ICD-10-CM

## 2019-10-01 LAB — POCT INR: INR: 2 (ref 2.0–3.0)

## 2019-10-01 NOTE — Progress Notes (Signed)
[facility] [place]   CC: Post op visit from September interventions, peripheral artery occlusive disease  History of Present Illness Briana Andrews is a 53 y.o. female who is s/p mechanical thrombectomy of the left popliteal and posterior tibial artery using the Penumbra CAT 6 device on 08-20-19 by Dr. 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  Findings:  Aortogram was deferred since it was performed yesterday by Dr. Myra Gianotti.  Ultimately crossed the aortic bifurcation and selected the left SFA and performed left lower extremity arteriogram that again confirmed left popliteal artery occlusion.  Ultimately placed a destination sheath over the aortic bifurcation into the left SFA.  The left popliteal and posterior tibial artery was treated with mechanical thrombectomy using a CAT 6 penumbra device.  Nitroglycerin was given intra-arterial throughout the case to prevent spasm.  Patient now has inline flow down popliteal artery into the anterior tibial, peroneal, and posterior tibial artery.  I suspect this was embolic as I did not see an underlying lesion that needed to be treated otherwise.  She has very small arteries.  She now has brisk dorsalis pedis and posterior tibial signals in the left foot.  She has cirrhosis with new heart failure and evidence of left ventricular thrombus.  She was seen over the previous weekend by Dr. Darrick Penna with evidence of bilateral popliteal artery occlusions and lower extremity ischemia.  She was felt to be a poor open surgical candidate. She is also s/p mechanical thrombectomy of the right superficial femoral, popliteal, and posterior tibial artery using the penumbra CAT 6 device, and angioplasty of the right superficial femoral-popliteal and posterior tibial artery on 08-19-19 by Dr. Myra Gianotti for bilateral lower extremity ischemia.  Findings:              Aortogram: No significant renal artery stenosis was  identified.  The infrarenal abdominal aorta is widely patent.  Bilateral common and external iliac arteries widely patent.             Right Lower Extremity: Right common femoral profundofemoral and superficial femoral artery are widely patent but small in caliber.  There is occlusion of popliteal artery with reconstitution of a diminutive posterior tibial artery.             Left Lower Extremity: The left common femoral profundofemoral superficial femoral artery widely patent.  Popliteal artery is occluded.  It appears to be two-vessel runoff with the dominant being the posterior tibial  Unclear if her visit today is a routine post op follow up or worsening right foot issue.  Unclear from pt and husband if the large dry eschar on her right foot is worse, better, or the same as pre September procedures.  She and husband state that she is not happy with the rehab facility at [facility] care. Pt states she got ready for her follow up here, and the staff left her there.  Pt states dressing changes on her right foot were done every other day at the rehab center. She states that PT and OT were minimal.  She states that she checked herself out from the rehab facility 4 days ago. Her son and husband are helping her. Husband states HH was referred 2 days ago, states have not seen HH yet or been in contact.  Pt states that for right foot wound care "my son is pouring alcohol on it" then Vaseline gauze dressing.   Her past medical history includes ETOH abuse, states she quit  that in August 2020.     Diabetic: No Tobacco use: former smoker  (quit August 2020)  Pt meds include: Statin :Yes Betablocker: No ASA: No Other anticoagulants/antiplatelets: warfarin   Past Medical History:  Diagnosis Date  . Alcohol abuse   . Tobacco abuse     Social History Social History   Tobacco Use  . Smoking status: Current Every Day Smoker  . Smokeless tobacco: Never Used  Substance Use Topics  . Alcohol  use: Yes  . Drug use: Yes    Types: Marijuana    Family History Family History  Problem Relation Age of Onset  . Heart disease Mother     Past Surgical History:  Procedure Laterality Date  . ABDOMINAL AORTOGRAM W/LOWER EXTREMITY N/A 08/19/2019   Procedure: ABDOMINAL AORTOGRAM W/LOWER EXTREMITY;  Surgeon: Nada LibmanBrabham, Vance W, MD;  Location: [facility];  Service: Cardiovascular;  Laterality: N/A;  . ABDOMINAL AORTOGRAM W/LOWER EXTREMITY Bilateral 08/20/2019   Procedure: ABDOMINAL AORTOGRAM W/LOWER EXTREMITY;  Surgeon: Cephus Shellinglark, Christopher J, MD;  Location: [facility];  Service: Cardiovascular;  Laterality: Bilateral;  . ESOPHAGOGASTRODUODENOSCOPY N/A 08/26/2019   Procedure: ESOPHAGOGASTRODUODENOSCOPY (EGD);  Surgeon: Charlott RakesSchooler, Vincent, MD;  [facility];  Service: Endoscopy;  Laterality: N/A;  . HOT HEMOSTASIS N/A 08/26/2019   Procedure: HOT HEMOSTASIS (ARGON PLASMA COAGULATION/BICAP);  Surgeon: Charlott RakesSchooler, Vincent, MD;  [facility];  Service: Endoscopy;  Laterality: N/A;  . PERIPHERAL VASCULAR BALLOON ANGIOPLASTY  08/19/2019   Procedure: PERIPHERAL VASCULAR BALLOON ANGIOPLASTY;  Surgeon: Nada LibmanBrabham, Vance W, MD;  Location: [facility];  Service: Cardiovascular;;  Right femoral popliteal and PT   . PERIPHERAL VASCULAR INTERVENTION Left 08/20/2019   Procedure: PERIPHERAL VASCULAR INTERVENTION;  Surgeon: Cephus Shellinglark, Christopher J, MD;  [facility];  Service: Cardiovascular;  Laterality: Left;  . RIGHT/LEFT HEART CATH AND CORONARY ANGIOGRAPHY N/A 09/02/2019   Procedure: RIGHT/LEFT HEART CATH AND CORONARY ANGIOGRAPHY;  Surgeon: Laurey MoraleMcLean, Dalton S, MD;  [facility];  Service: Cardiovascular;  Laterality: N/A;  . SCLEROTHERAPY  08/26/2019   Procedure: SCLEROTHERAPY;  Surgeon: Charlott RakesSchooler, Vincent, MD;  [facility];  Service: Endoscopy;;    Allergies  Allergen Reactions  . Penicillins Other (See Comments)    Unsure of reaction Did  it involve swelling of the face/tongue/throat, SOB, or low BP? Unknown Did it involve sudden or severe rash/hives, skin peeling, or any reaction on the inside of your mouth or nose? Unknown Did you need to seek medical attention at a hospital or doctor's office? Unknown When did it last happen?[place] If all above answers are "NO", may proceed with cephalosporin use.    Current Outpatient Medications  Medication Sig Dispense Refill  . digoxin (LANOXIN) 0.125 MG tablet Take 1 tablet (0.125 mg total) by mouth every other day. 30 tablet 5  . folic acid (FOLVITE) 1 MG tablet Take 1 tablet (1 mg total) by mouth daily. 30 tablet 5  . furosemide (LASIX) 20 MG tablet Take 1 tablet (20 mg total) by mouth daily. 30 tablet 6  . pantoprazole (PROTONIX) 40 MG tablet Take 1 tablet (40 mg total) by mouth daily. 30 tablet 5  . rosuvastatin (CRESTOR) 5 MG tablet Take 1 tablet (5 mg total) by mouth daily at 6 PM. 90 tablet 3  . warfarin (COUMADIN) 2.5 MG tablet Take 1 tablet (2.5 mg total) by mouth daily at 6 PM. 30 tablet 5   No current facility-administered medications for this visit.     ROS:  See HPI for pertinent positives and negatives.   Physical Examination  Vitals:   10/01/19 1241  BP: 94/72  Pulse: 80  Resp: 12  Temp: 97.9 F (36.6 C)  TempSrc: Temporal  SpO2: 97%  Weight: 148 lb (67.1 kg)  Height: 5\' 5"  (1.651 m)   Body mass index is 24.63 kg/m.  General: A&O x 3, WDWN, female accompanied by her husband. Gait: seated in w/c HEENT: No gross abnormalities.  Pulmonary: Respirations are non labored Cardiac: regular rhythm  Radial pulses are 2+ palpable bilaterally   Adominal aortic pulse is not palpable                         VASCULAR EXAM: Extremities with ischemic changes at right foot, without Gangrene; with open wounds at right foot. See photos below       Brisk right PT multiphasic Doppler signal                                                                                                             LE Pulses Right Left       FEMORAL  2+ palpable  2+ palpable        POPLITEAL  not palpable   not palpable       POSTERIOR TIBIAL  faintly palpable, +brisk multiphasic Doppler Signal   no wounds        DORSALIS PEDIS      ANTERIOR TIBIAL  large eschar  No wounds    Abdomen: soft, NT, no palpable masses. Skin: see Extremities Musculoskeletal: no muscle wasting or atrophy.  Neurologic: A&O X 3; appropriate affect, Sensation is normal; MOTOR FUNCTION:  moving all extremities equally, motor strength 5/5 throughout. Speech is fluent/normal. CN 2-12 intact. Psychiatric: Thought content is normal, mood appropriate for clinical situation. Loquacious.      ASSESSMENT: Briana CoeMichele Foye is a 53 y.o. female who presents with large eschar wound of right foot, no pain, no signs of infection.   She is s/p mechanical thrombectomy of the left popliteal and posterior tibial artery using the Penumbra CAT 6 device on 08-20-19 by Dr. 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  She is also s/p mechanical thrombectomy of the right superficial femoral, popliteal, and posterior tibial artery using the penumbra CAT 6 device, and angioplasty of the right superficial femoral-popliteal and posterior tibial artery on 08-19-19 by Dr. Myra GianottiBrabham for bilateral lower extremity ischemia.    Right ABI today is normal with bi and triphasic waveforms. Right LE arterial duplex today shows no stenosis with triphasic waveforms.   She is PCN allergic, she takes warfarin which has adverse reaction with doxycycline and Bactrim; will therefore defer antibiotic for prophylaxis of infection.  The right foot wound does not appear infected, she has no fever or chills.    Refer to Dr. Lajoyce Cornersuda for possible debridement right foot eschar, in attempt to salvage right lower extremity.    DATA  Right LE Arterial Duplex (10-01-19): RIGHT  PSV  cm/sRatioStenosisWaveform Comments +-----------+--------+-----+--------+---------+--------+ CFA Prox   144                  triphasic         +-----------+--------+-----+--------+---------+--------+ DFA        59                   triphasic         +-----------+--------+-----+--------+---------+--------+ SFA Prox   104                  triphasic         +-----------+--------+-----+--------+---------+--------+ SFA Mid    131                  triphasic         +-----------+--------+-----+--------+---------+--------+ SFA Distal 133                  triphasic         +-----------+--------+-----+--------+---------+--------+ POP Mid    206                  triphasic         +-----------+--------+-----+--------+---------+--------+ ATA Distal 77                   triphasic         +-----------+--------+-----+--------+---------+--------+ PTA Distal 77                   triphasic         +-----------+--------+-----+--------+---------+--------+ PERO Distal36                   biphasic          +-----------+--------+-----+--------+---------+--------+  Enlarged lymph node right groin. Summary: Right: Normal examination. No evidence of significant arterial occlusive disease.   ABI Findings (10-01-19): +---------+------------------+-----+---------+----------------------------+ Right    Rt Pressure (mmHg)IndexWaveform Comment                      +---------+------------------+-----+---------+----------------------------+ Brachial 117                                                          +---------+------------------+-----+---------+----------------------------+ PTA      127               1.09 biphasic                              +---------+------------------+-----+---------+----------------------------+ DP       126               1.08 triphasic                              +---------+------------------+-----+---------+----------------------------+ Great Toe                                Not obtained due to movement +---------+------------------+-----+---------+----------------------------+  +---------+------------------+-----+---------+-------+ Left     Lt Pressure (mmHg)IndexWaveform Comment +---------+------------------+-----+---------+-------+ Brachial 116                                     +---------+------------------+-----+---------+-------+  PTA      92                0.79 triphasic        +---------+------------------+-----+---------+-------+ DP       103               0.88 biphasic         +---------+------------------+-----+---------+-------+ Great Toe100               0.85 Normal           +---------+------------------+-----+---------+-------+  +-------+-----------+-----------+------------+------------+ ABI/TBIToday's ABIToday's TBIPrevious ABIPrevious TBI +-------+-----------+-----------+------------+------------+ Right  1.09                                           +-------+-----------+-----------+------------+------------+ Left   0.88       0.85                                +-------+-----------+-----------+------------+------------+  Summary: Right: Resting right ankle-brachial index is within normal range. No evidence of significant right lower extremity arterial disease. Unable to obtain toe-brachial index due to movement.  Left: Resting left ankle-brachial index indicates mild left lower extremity arterial disease. The left toe-brachial index is normal. Technically difficult due to movement.   PLAN:  Based on the patient's vascular studies and examination, and after discussing with Dr. Edilia Bo, will refer to Dr. Lajoyce Corners ASAP to evaluate for any interventions to salvage right foot and leg..  Pt will return to clinic in 3 months with ABI's, see Dr. Myra Gianotti or Dr. Chestine Spore.   I  discussed in depth with the patient the nature of atherosclerosis, and emphasized the importance of maximal medical management including strict control of blood pressure, blood glucose, and lipid levels, obtaining regular exercise, and continued cessation of smoking.  The patient is aware that without maximal medical management the underlying atherosclerotic disease process will progress, limiting the benefit of any interventions.  The patient was given information about PAD including signs, symptoms, treatment, what symptoms should prompt the patient to seek immediate medical care, and risk reduction measures to take.  Charisse March, RN, MSN, FNP-C [facility] [place] Phone: [phone]  Clinic MD: Veda Canning  10/01/19 12:49 PM

## 2019-10-01 NOTE — Telephone Encounter (Signed)
Received message from [facility] that car team that covers patients area is full and will not be able to service patient as this time.  All documentation faxed to [facility] [phone] for review for nursing and PT services

## 2019-10-01 NOTE — Patient Instructions (Signed)
Please continue current dosage of 2.5 mg every day. Recheck INR next week.  You have an appt w/ Dr. Garen Lah 11/9 @ 8:40 - I will check your INR at that time.

## 2019-10-01 NOTE — Patient Instructions (Addendum)
Peripheral Vascular Disease  Peripheral vascular disease (PVD) is a disease of the blood vessels that are not part of your heart and brain. A simple term for PVD is poor circulation. In most cases, PVD narrows the blood vessels that carry blood from your heart to the rest of your body. This can reduce the supply of blood to your arms, legs, and internal organs, like your stomach or kidneys. However, PVD most often affects a person's lower legs and feet. Without treatment, PVD tends to get worse. PVD can also lead to acute ischemic limb. This is when an arm or leg suddenly cannot get enough blood. This is a medical emergency. Follow these instructions at home: Lifestyle  Do not use any products that contain nicotine or tobacco, such as cigarettes and e-cigarettes. If you need help quitting, ask your doctor.  Lose weight if you are overweight. Or, stay at a healthy weight as told by your doctor.  Eat a diet that is low in fat and cholesterol. If you need help, ask your doctor.  Exercise regularly. Ask your doctor for activities that are right for you. General instructions  Take over-the-counter and prescription medicines only as told by your doctor.  Take good care of your feet: ? Wear comfortable shoes that fit well. ? Check your feet often for any cuts or sores.  Keep all follow-up visits as told by your doctor This is important. Contact a doctor if:  You have cramps in your legs when you walk.  You have leg pain when you are at rest.  You have coldness in a leg or foot.  Your skin changes.  You are unable to get or have an erection (erectile dysfunction).  You have cuts or sores on your feet that do not heal. Get help right away if:  Your arm or leg turns cold, numb, and blue.  Your arms or legs become red, warm, swollen, painful, or numb.  You have chest pain.  You have trouble breathing.  You suddenly have weakness in your face, arm, or leg.  You become very  confused or you cannot speak.  You suddenly have a very bad headache.  You suddenly cannot see. Summary  Peripheral vascular disease (PVD) is a disease of the blood vessels.  A simple term for PVD is poor circulation. Without treatment, PVD tends to get worse.  Treatment may include exercise, low fat and low cholesterol diet, and quitting smoking. This information is not intended to replace advice given to you by your health care provider. Make sure you discuss any questions you have with your health care provider. Document Released: 02/07/2010 Document Revised: 10/26/2017 Document Reviewed: 12/21/2016 Elsevier Patient Education  2020 [employer] Inc[employer]  

## 2019-10-02 ENCOUNTER — Ambulatory Visit: Payer: 59 | Admitting: Orthopedic Surgery

## 2019-10-02 ENCOUNTER — Other Ambulatory Visit: Payer: Self-pay

## 2019-10-02 DIAGNOSIS — L97519 Non-pressure chronic ulcer of other part of right foot with unspecified severity: Secondary | ICD-10-CM

## 2019-10-02 NOTE — Telephone Encounter (Signed)
Received community message from [facility], they are unable to accept patient at this time.  Contacted [facility] and patient is out of network and would have to pay out of pocket.  Contacted Tiffany of [facility], information given.  She will return call to office with determination of whether or not patient can be accepted

## 2019-10-03 ENCOUNTER — Encounter: Payer: Self-pay | Admitting: Family

## 2019-10-03 ENCOUNTER — Ambulatory Visit: Payer: 59

## 2019-10-03 ENCOUNTER — Ambulatory Visit (INDEPENDENT_AMBULATORY_CARE_PROVIDER_SITE_OTHER): Payer: 59 | Admitting: Family

## 2019-10-03 VITALS — Ht 65.0 in | Wt 148.0 lb

## 2019-10-03 DIAGNOSIS — L98499 Non-pressure chronic ulcer of skin of other sites with unspecified severity: Secondary | ICD-10-CM | POA: Diagnosis not present

## 2019-10-03 DIAGNOSIS — I739 Peripheral vascular disease, unspecified: Secondary | ICD-10-CM | POA: Diagnosis not present

## 2019-10-03 NOTE — Progress Notes (Signed)
Office Visit Note   Patient: Briana Andrews           Date of Birth: [birth year]           MRN: [id number] Visit Date: 10/03/2019              Requested by: Nada Libman, MD [address] [place],  [place] [address] PCP: Patient, [name]  Chief Complaint  Patient presents with  . Right Foot - Open Wound, Pain      HPI: The patient is a 53 year old woman who is seen today for initial evaluation of a dorsal foot ulcer on the right.  She has an extensive past medical history including ETOH abuse, cirrhosis, new heart failure, left ventricular thrombus, bilateral popliteal artery occlusions, critical lower limb ischemia.   She is also s/p mechanical thrombectomy of the right superficial femoral, popliteal, and posterior tibial artery using the penumbra CAT 6 device, and angioplasty of the right superficial femoral-popliteal and posterior tibial artery on 08-19-19 by Dr. Myra Gianotti for bilateral lower extremity ischemia.   She most recently was seen by vascular surgery it was felt that she should see Dr. Lajoyce Corners for evaluation for possible I and D.   As noted by the vascular nurse practitioner the history is spotty patient and her spouse are unable to relate how this began how long its been going whether it is improving or worsening apparently they had been pouring alcohol over the wound and then dressing it with Vaseline.  The patient was in a rehab center briefly and is now home.  Assessment & Plan: Visit Diagnoses:  1. PVD (peripheral vascular disease) (HCC)   2. Ischemic ulcer, unspecified ulcer stage (HCC)     Plan: Feel that the patient would benefit from irrigation and debridement in the OR we will discussed with Dr. Lajoyce Corners patient would like to follow-up with him face-to-face next week.  We will apply a dry dressing today she will follow-up in the office next week discussed proper wound care in the interim.  Follow-Up Instructions: Return in about 4 days (around 10/07/2019) for  afternoon -duda.   Ortho Exam  Patient is alert, oriented, no adenopathy, well-dressed, normal affect, normal respiratory effort. On examination of the right lower extremity the patient has a large is ischemic ulcer covered with eschar to the dorsum of her foot this extends to the second toe as well as the great toe some of the eschar was debrided there is underlying epithelialization.  The wound edges appear to be healing.  There is no surrounding maceration no active drainage no surrounding erythema no sign of infection  Imaging: No results found. No images are attached to the encounter.  Labs: Lab Results  Component Value Date   HGBA1C 5.8 (H) 08/17/2019   ESRSEDRATE 0 08/16/2019   REPTSTATUS 08/19/2019 FINAL 08/18/2019   CULT  08/18/2019    NO GROWTH Performed at [facility], [address], [place], [place] [address]      Lab Results  Component Value Date   ALBUMIN 1.2 (L) 09/05/2019   ALBUMIN 1.3 (L) 09/03/2019   ALBUMIN 1.3 (L) 09/02/2019    Lab Results  Component Value Date   MG 2.1 09/08/2019   MG 1.4 (L) 09/07/2019   MG 1.9 09/02/2019   No results found for: VD25OH  No results found for: PREALBUMIN CBC EXTENDED Latest Ref Rng & Units 09/29/2019 09/13/2019 09/12/2019  WBC 4.0 - 10.5 K/uL 8.3 9.0 5.4  RBC 3.87 -  5.11 MIL/uL 3.21(L) 2.67(L) 5.20(H)  HGB 12.0 - 15.0 g/dL 10.2(L) 8.4(L) 16.2(H)  HCT 36.0 - 46.0 % 31.4(L) 25.4(L) 49.5(H)  PLT 150 - 400 K/uL 680(H) 455(H) 174  NEUTROABS 1.7 - 7.7 K/uL - - -  LYMPHSABS 0.7 - 4.0 K/uL - - -     Body mass index is 24.63 kg/m.  Orders:  No orders of the defined types were placed in this encounter.  No orders of the defined types were placed in this encounter.    Procedures: No procedures performed  Clinical Data: No additional findings.  ROS:  All other systems negative, except as noted in the HPI. Review of Systems  Objective: Vital Signs: Ht 5\' 5"  (1.651 m)   Wt 148 lb (67.1 kg)   BMI  24.63 kg/m   Specialty Comments:  No specialty comments available.  PMFS History: Patient Active Problem List   Diagnosis Date Noted  . Endotracheally intubated   . Acute on chronic systolic CHF (congestive heart failure) (HCC)   . GI bleed 08/26/2019  . Pressure injury of skin 08/23/2019  . Acute upper GI bleed   . Shock circulatory (HCC)   . Palliative care by specialist   . Goals of care, counseling/discussion   . Advanced care planning/counseling discussion   . Jaundice   . Cerebral embolism with cerebral infarction 08/17/2019  . PVD (peripheral vascular disease) (HCC) 08/17/2019  . 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 Acute CHF (congestive heart failure) (HCC) 08/17/2019  . Abnormal LFTs 08/16/2019  . Acute metabolic encephalopathy 08/16/2019  . SOB (shortness of breath) 08/16/2019  . AKI (acute kidney injury) (HCC) 08/16/2019  . Fall 08/16/2019  . Lactic acidosis 08/16/2019  . Back pain 08/16/2019  . Liver failure without hepatic coma (HCC) 08/16/2019  . Multifocal pneumonia 08/16/2019  . Alcohol abuse   . Tobacco abuse   . Shortness of breath    Past Medical History:  Diagnosis Date  . Alcohol abuse   . Tobacco abuse     Family History  Problem Relation Age of Onset  . Heart disease Mother     Past Surgical History:  Procedure Laterality Date  . ABDOMINAL AORTOGRAM W/LOWER EXTREMITY N/A 08/19/2019   Procedure: ABDOMINAL AORTOGRAM W/LOWER EXTREMITY;  Surgeon: 08/21/2019, MD;  Location: [facility];  Service: Cardiovascular;  Laterality: N/A;  . ABDOMINAL AORTOGRAM W/LOWER EXTREMITY Bilateral 08/20/2019   Procedure: ABDOMINAL AORTOGRAM W/LOWER EXTREMITY;  Surgeon: 08/22/2019, MD;  Location: [facility];  Service: Cardiovascular;  Laterality: Bilateral;  . ESOPHAGOGASTRODUODENOSCOPY N/A 08/26/2019   Procedure: ESOPHAGOGASTRODUODENOSCOPY (EGD);  Surgeon: 08/28/2019, MD;  Location: [facility] ENDOSCOPY;   Service: Endoscopy;  Laterality: N/A;  . HOT HEMOSTASIS N/A 08/26/2019   Procedure: HOT HEMOSTASIS (ARGON PLASMA COAGULATION/BICAP);  Surgeon: 08/28/2019, MD;  Location: [facility] ENDOSCOPY;  Service: Endoscopy;  Laterality: N/A;  . PERIPHERAL VASCULAR BALLOON ANGIOPLASTY  08/19/2019   Procedure: PERIPHERAL VASCULAR BALLOON ANGIOPLASTY;  Surgeon: 08/21/2019, MD;  Location: [facility];  Service: Cardiovascular;;  Right femoral popliteal and PT   . PERIPHERAL VASCULAR INTERVENTION Left 08/20/2019   Procedure: PERIPHERAL VASCULAR INTERVENTION;  Surgeon: 08/22/2019, MD;  Location: [facility] INVASIVE CV LAB;  Service: Cardiovascular;  Laterality: Left;  . RIGHT/LEFT HEART CATH AND CORONARY ANGIOGRAPHY N/A 09/02/2019   Procedure: RIGHT/LEFT HEART CATH AND CORONARY ANGIOGRAPHY;  Surgeon: 11/02/2019, MD;  Location[facility] INVASIVE CV LAB;  Service: Cardiovascular;  Laterality:  N/A;  . SCLEROTHERAPY  08/26/2019   Procedure: SCLEROTHERAPY;  Surgeon: Wilford Corner, MD;  Location: [facility] ENDOSCOPY;  Service: Endoscopy;;   Social History   Occupational History  . Not on file  Tobacco Use  . Smoking status: Current Every Day Smoker  . Smokeless tobacco: Never Used  Substance and Sexual Activity  . Alcohol use: Yes  . Drug use: Yes    Types: Marijuana  . Sexual activity: Not on file

## 2019-10-06 ENCOUNTER — Ambulatory Visit (INDEPENDENT_AMBULATORY_CARE_PROVIDER_SITE_OTHER): Payer: 59

## 2019-10-06 ENCOUNTER — Ambulatory Visit (INDEPENDENT_AMBULATORY_CARE_PROVIDER_SITE_OTHER): Payer: 59 | Admitting: Cardiology

## 2019-10-06 ENCOUNTER — Other Ambulatory Visit: Payer: Self-pay

## 2019-10-06 ENCOUNTER — Encounter: Payer: Self-pay | Admitting: Cardiology

## 2019-10-06 VITALS — BP 100/70 | HR 107 | Ht 65.0 in | Wt 148.0 lb

## 2019-10-06 DIAGNOSIS — I5020 Unspecified systolic (congestive) heart failure: Secondary | ICD-10-CM | POA: Diagnosis not present

## 2019-10-06 DIAGNOSIS — I240 Acute coronary thrombosis not resulting in myocardial infarction: Secondary | ICD-10-CM | POA: Diagnosis not present

## 2019-10-06 DIAGNOSIS — Z5181 Encounter for therapeutic drug level monitoring: Secondary | ICD-10-CM | POA: Diagnosis not present

## 2019-10-06 LAB — POCT INR: INR: 1.9 — AB (ref 2.0–3.0)

## 2019-10-06 MED ORDER — METOPROLOL SUCCINATE ER 25 MG PO TB24: 25.0000 mg | ORAL_TABLET | Freq: Every day | ORAL | 6 refills | Status: DC

## 2019-10-06 NOTE — Patient Instructions (Signed)
Medication Instructions:  Your physician has recommended you make the following change in your medication:  1- START Toprol XL 25 mg by mouth once a day.  *If you need a refill on your cardiac medications before your next appointment, please call your pharmacy*  Lab Work: none If you have labs (blood work) drawn today and your tests are completely normal, you will receive your results only by: Marland Kitchen MyChart Message (if you have MyChart) OR . A paper copy in the mail If you have any lab test that is abnormal or we need to change your treatment, we will call you to review the results.  Testing/Procedures: none  Follow-Up: At [facility], you and your health needs are our priority.  As part of our continuing mission to provide you with exceptional heart care, we have created designated Provider Care Teams.  These Care Teams include your primary Cardiologist (physician) and Advanced Practice Providers (APPs -  Physician Assistants and Nurse Practitioners) who all work together to provide you with the care you need, when you need it.  Your next appointment:   1 month.  The format for your next appointment:   In Person  Provider:    You may see Kate Sable, MD or one of the following Advanced Practice Providers on your designated Care Team:    Murray Hodgkins, NP  Christell Faith, PA-C  Marrianne Mood, PA-C

## 2019-10-06 NOTE — Patient Instructions (Signed)
Please START NEW DOSAGE of T tablet (2.5 mg) every day EXCEPT 1.5 TABLETS ON MONDAYS & FRIDAYS.  Recheck INR in 2 weeks.

## 2019-10-06 NOTE — Progress Notes (Signed)
Cardiology Office Note:    Date:  10/06/2019   ID:  Briana Andrews, DOB [birth year], MRN [id number]  PCP:  Patient, [name]  Cardiologist:  Debbe Odea, MD  Electrophysiologist:  None   Referring MD: No ref. provider found   Chief Complaint  Patient presents with  . New Patient (Initial Visit)    Hospital F/U-CHF; Meds reviewed verbally with patient.    History of Present Illness:    Briana Andrews is a 53 y.o. female with a hx of alcohol abuse x 20years, alcohol hepatitis, DVT, gastric/duodenal ulcers, tobacco use x 20years, CVA, HFrEF EF 35-40%, LV thrombus who presents to establish care and as a hospital follow-up due to history of CHF.  Patient was admitted to the hospital about 6 weeks ago for worsening dyspnea and confusion.  Evaluation revealed a right frontal infarct, echocardiogram9/19/2020 showed severely reduced EF of 15 to 20% and an LV thrombus of about 3 x 1.4 cm.  Was placed on anticoagulation.  Follow-up echocardiogram on 08/31/2019 showed improvement in the ejection fraction , 35 to 40% and slight improvement in the LV thrombus.  Left heart cath showed LAD 40%, left circumflex 50% distal, RCA 80% stenosis proximally, no intervention was performed because it was not deemed causing her cardiomyopathy.  Etiology was likely alcohol.  During hospitalization, PAD/bilateral popliteal artery occlusions was noted and suspected from cardioembolic event from LV thrombus.  Patient had a mechanical thrombectomy by vascular surgery.  Since discharge, patient states feeling a whole lot better.  She quit drinking and smoking.  Past Medical History:  Diagnosis Date  . Alcohol abuse   . CHF (congestive heart failure) (HCC)    last EF 35-40%. etoh induced  . Coronary artery disease    80%RCA, 50%LCX, 40%LAD  . LV (left ventricular) mural thrombus   . Tobacco abuse     Past Surgical History:  Procedure Laterality Date  . ABDOMINAL AORTOGRAM W/LOWER EXTREMITY N/A 08/19/2019    Procedure: ABDOMINAL AORTOGRAM W/LOWER EXTREMITY;  Surgeon: Nada Libman, MD;  Location: [facility];  Service: Cardiovascular;  Laterality: N/A;  . ABDOMINAL AORTOGRAM W/LOWER EXTREMITY Bilateral 08/20/2019   Procedure: ABDOMINAL AORTOGRAM W/LOWER EXTREMITY;  Surgeon: Cephus Shelling, MD;  Location: [facility];  Service: Cardiovascular;  Laterality: Bilateral;  . ESOPHAGOGASTRODUODENOSCOPY N/A 08/26/2019   Procedure: ESOPHAGOGASTRODUODENOSCOPY (EGD);  Surgeon: Charlott Rakes, MD;  Location: [facility] ENDOSCOPY;  Service: Endoscopy;  Laterality: N/A;  . HOT HEMOSTASIS N/A 08/26/2019   Procedure: HOT HEMOSTASIS (ARGON PLASMA COAGULATION/BICAP);  Surgeon: Charlott Rakes, MD;  Location: [facility] ENDOSCOPY;  Service: Endoscopy;  Laterality: N/A;  . PERIPHERAL VASCULAR BALLOON ANGIOPLASTY  08/19/2019   Procedure: PERIPHERAL VASCULAR BALLOON ANGIOPLASTY;  Surgeon: Nada Libman, MD;  Location: [facility];  Service: Cardiovascular;;  Right femoral popliteal and PT   . PERIPHERAL VASCULAR INTERVENTION Left 08/20/2019   Procedure: PERIPHERAL VASCULAR INTERVENTION;  Surgeon: Cephus Shelling, MD;  Location[facility] INVASIVE CV LAB;  Service: Cardiovascular;  Laterality: Left;  . RIGHT/LEFT HEART CATH AND CORONARY ANGIOGRAPHY N/A 09/02/2019   Procedure: RIGHT/LEFT HEART CATH AND CORONARY ANGIOGRAPHY;  Surgeon: Laurey Morale, MD;  Location: [facility] INVASIVE CV LAB;  Service: Cardiovascular;  Laterality: N/A;  . SCLEROTHERAPY  08/26/2019   Procedure: SCLEROTHERAPY;  Surgeon: Charlott Rakes, MD;  Location: [facility] ENDOSCOPY;  Service: Endoscopy;;    Current Medications: Current Meds  Medication Sig  . digoxin (LANOXIN) 0.125 MG tablet Take 1 tablet (0.125 mg total) by mouth every  other day.  . folic acid (FOLVITE) 1 MG tablet Take 1 tablet (1 mg total) by mouth daily.  . furosemide (LASIX) 20 MG tablet Take 1 tablet (20 mg total) by mouth daily.  . pantoprazole (PROTONIX) 40 MG tablet  Take 1 tablet (40 mg total) by mouth daily.  . rosuvastatin (CRESTOR) 5 MG tablet Take 1 tablet (5 mg total) by mouth daily at 6 PM.  . warfarin (COUMADIN) 2.5 MG tablet Take 1 tablet (2.5 mg total) by mouth daily at 6 PM.     Allergies:   Penicillins   Social History   Socioeconomic History  . Marital status: Married    Spouse name: Not on file  . Number of children: Not on file  . Years of education: Not on file  . Highest education level: Not on file  Occupational History  . Not on file  Social Needs  . Financial resource strain: Not on file  . Food insecurity    Worry: Not on file    Inability: Not on file  . Transportation needs    Medical: Not on file    Non-medical: Not on file  Tobacco Use  . Smoking status: Current Every Day Smoker  . Smokeless tobacco: Never Used  Substance and Sexual Activity  . Alcohol use: Yes  . Drug use: Yes    Types: Marijuana  . Sexual activity: Not on file  Lifestyle  . Physical activity    Days per week: Not on file    Minutes per session: Not on file  . Stress: Not on file  Relationships  . Social Musician on phone: Not on file    Gets together: Not on file    Attends religious service: Not on file    Active member of club or organization: Not on file    Attends meetings of clubs or organizations: Not on file    Relationship status: Not on file  Other Topics Concern  . Not on file  Social History Narrative  . Not on file     Family History: The patient's family history includes Heart disease in her mother.  ROS:   Please see the history of present illness.     All other systems reviewed and are negative.  EKGs/Labs/Other Studies Reviewed:    The following studies were reviewed today: LHC  Left Main  Short, no significant disease.  Left Anterior Descending  40% mid LAD stenosis after D2, diffuse moderate luminal irregularities distal LAD.  Ramus Intermedius  Small caliber vessel, appears to be  diffusely severely diseased then subtotally occluded in the mid-vessel.  Left Circumflex  50% distal LCx stenosis. Diffuse moderate disease distal OM branches.  Right Coronary Artery  80% proximal RCA stenosis. 60% mid PDA stenosis.  Intervention   EKG:  EKG is  ordered today.  The ekg ordered today demonstrates sinus tachycardia, heart rate 107, inferolateral ST-T changes.  Recent Labs: 09/05/2019: ALT 33 09/08/2019: Magnesium 2.1 09/29/2019: BUN 13; Creatinine, Ser 1.00; Hemoglobin 10.2; Platelets 680; Potassium 3.5; Sodium 129  Recent Lipid Panel    Component Value Date/Time   CHOL 93 08/17/2019 0439   TRIG 165 (H) 08/26/2019 0730   HDL <10 (L) 08/17/2019 0439   CHOLHDL NOT CALCULATED 08/17/2019 0439   VLDL 35 08/17/2019 0439   LDLCALC NOT CALCULATED 08/17/2019 0439    Physical Exam:    VS:  BP 100/70 (BP Location: Left Arm, Patient Position: Sitting, Cuff Size: Normal)  Pulse (!) 107   Ht 5\' 5"  (1.651 m)   Wt 148 lb (67.1 kg) Comment: Estimated by patient-unable to get on scale  SpO2 99%   BMI 24.63 kg/m     Wt Readings from Last 3 Encounters:  10/06/19 148 lb (67.1 kg)  10/03/19 148 lb (67.1 kg)  10/01/19 148 lb (67.1 kg)     GEN:  Well nourished, well developed in no acute distress HEENT: Normal NECK: No JVD; No carotid bruits LYMPHATICS: No lymphadenopathy CARDIAC: RRR, no murmurs, rubs, gallops RESPIRATORY:  Clear to auscultation without rales, wheezing or rhonchi  ABDOMEN: Soft, non-tender, non-distended MUSCULOSKELETAL: Right foot in boot, no edema on left foot. 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  Patient appears euvolemic.  Blood pressure on the low normal side.  EKG shows sinus tachycardia. 1. Heart failure with reduced ejection fraction (HCC)   2. Left  ventricular apical thrombus without MI (HCC)    PLAN:    In order of problems listed above:  1. Start Toprol-XL 25 mg daily.  Continue Lasix 20 mg daily.  If her blood pressure tolerates, we will plan to start low-dose Entresto at next visit.  Patient advised to get a blood pressure cuff and check blood pressures daily. 2. Continue warfarin for LV thrombus.  Plan to repeat echocardiogram in about 3 months unless acute symptoms present.   Medication Adjustments/Labs and Tests Ordered: Current medicines are reviewed at length with the patient today.  Concerns regarding medicines are outlined above.  Orders Placed This Encounter  Procedures  . EKG 12-Lead   Meds ordered this encounter  Medications  . metoprolol succinate (TOPROL-XL) 25 MG 24 hr tablet    Sig: Take 1 tablet (25 mg total) by mouth daily.    Dispense:  30 tablet    Refill:  6    Patient Instructions  Medication Instructions:  Your physician has recommended you make the following change in your medication:  1- START Toprol XL 25 mg by mouth once a day.  *If you need a refill on your cardiac medications before your next appointment, please call your pharmacy*  Lab Work: none If you have labs (blood work) drawn today and your tests are completely normal, you will receive your results only by: Marland Kitchen. MyChart Message (if you have MyChart) OR . A paper copy in the mail If you have any lab test that is abnormal or we need to change your treatment, we will call you to review the results.  Testing/Procedures: none  Follow-Up: At [facility], you and your health needs are our priority.  As part of our continuing mission to provide you with exceptional heart care, we have created designated Provider Care Teams.  These Care Teams include your primary Cardiologist (physician) and Advanced Practice Providers (APPs -  Physician Assistants and Nurse Practitioners) who all work together to provide you with the care you need, when  you need it.  Your next appointment:   1 month.  The format for your next appointment:   In Person  Provider:    You may see Debbe OdeaBrian Agbor-Etang, MD or one of the following Advanced Practice Providers on your designated Care Team:    Nicolasa Duckinghristopher Berge, NP  Eula Listenyan Dunn, PA-C  Marisue IvanJacquelyn Visser, PA-C      Signed, Debbe OdeaBrian Agbor-Etang,  MD  10/06/2019 10:42 AM    [facility]

## 2019-10-07 ENCOUNTER — Ambulatory Visit (INDEPENDENT_AMBULATORY_CARE_PROVIDER_SITE_OTHER): Payer: 59 | Admitting: Orthopedic Surgery

## 2019-10-07 ENCOUNTER — Encounter: Payer: Self-pay | Admitting: Orthopedic Surgery

## 2019-10-07 VITALS — Ht 65.0 in | Wt 148.0 lb

## 2019-10-07 DIAGNOSIS — I96 Gangrene, not elsewhere classified: Secondary | ICD-10-CM | POA: Diagnosis not present

## 2019-10-07 NOTE — Progress Notes (Signed)
Office Visit Note   Patient: Briana Andrews           Date of Birth: [birth year]           MRN: [id number] Visit Date: 10/07/2019              Requested by: No referring provider defined for this encounter. PCP: Patient, [name]  Chief Complaint  Patient presents with  . Right Foot - Pain, Wound Check, Follow-up      HPI: Patient is a 53 year old woman who was seen for a large necrotic wound on the dorsal aspect of the right foot she states she has some drainage and odor.  Patient states that her pain is 2-3 over 10.  Patient states that this ulcer started in September.  Patient states that she had multiple medical issues she had a stroke she had renal failure with multisystem failure.  Patient does not know if she fell on her foot had blunt trauma or if her foot was injected.  Patient states she is recently stopped smoking and stop drinking.   Assessment & Plan: Visit Diagnoses:  1. Gangrene of right foot (HCC)     Plan: Discussed with the patient the best treatment for foot salvage intervention would be surgical debridement placement of an installation wound VAC followed by repeat surgery with application of split-thickness skin graft and a cleanse choice wound VAC sponge.  Patient states she would like to proceed with surgery.  Follow-Up Instructions: Return in about 2 weeks (around 10/21/2019).   Ortho Exam  Patient is alert, oriented, no adenopathy, well-dressed, normal affect, normal respiratory effort. Examination patient has a black full-thickness eschar that covers the entire dorsum of her foot it is 15 cm x 10 cm.  Using the Doppler she has a triphasic dorsalis pedis and posterior tibial pulse.  There are no plantar ulcers.  Imaging: No results found.   Labs: Lab Results  Component Value Date   HGBA1C 5.8 (H) 08/17/2019   ESRSEDRATE 0 08/16/2019   REPTSTATUS 08/19/2019 FINAL 08/18/2019   CULT  08/18/2019    NO GROWTH Performed at [facility] Lab,  [address] [place], [place] [address]      Lab Results  Component Value Date   ALBUMIN 1.2 (L) 09/05/2019   ALBUMIN 1.3 (L) 09/03/2019   ALBUMIN 1.3 (L) 09/02/2019    Lab Results  Component Value Date   MG 2.1 09/08/2019   MG 1.4 (L) 09/07/2019   MG 1.9 09/02/2019   No results found for: VD25OH  No results found for: PREALBUMIN CBC EXTENDED Latest Ref Rng & Units 09/29/2019 09/13/2019 09/12/2019  WBC 4.0 - 10.5 K/uL 8.3 9.0 5.4  RBC 3.87 - 5.11 MIL/uL 3.21(L) 2.67(L) 5.20(H)  HGB 12.0 - 15.0 g/dL 10.2(L) 8.4(L) 16.2(H)  HCT 36.0 - 46.0 % 31.4(L) 25.4(L) 49.5(H)  PLT 150 - 400 K/uL 680(H) 455(H) 174  NEUTROABS 1.7 - 7.7 K/uL - - -  LYMPHSABS 0.7 - 4.0 K/uL - - -     Body mass index is 24.63 kg/m.  Orders:  No orders of the defined types were placed in this encounter.  No orders of the defined types were placed in this encounter.    Procedures: No procedures performed  Clinical Data: No additional findings.  ROS:  All other systems negative, except as noted in the HPI. Review of Systems  Objective: Vital Signs: Ht 5\' 5"  (1.651 m)   Wt 148 lb (67.1 kg)  BMI 24.63 kg/m   Specialty Comments:  No specialty comments available.  PMFS History: Patient Active Problem List   Diagnosis Date Noted  . Endotracheally intubated   . Acute on chronic systolic CHF (congestive heart failure) ([place])   . GI bleed 08/26/2019  . Pressure injury of skin 08/23/2019  . Acute upper GI bleed   . Shock circulatory ([place])   . Palliative care by specialist   . Goals of care, counseling/discussion   . Advanced care planning/counseling discussion   . Jaundice   . Cerebral embolism with cerebral infarction 08/17/2019  . PVD (peripheral vascular disease) ([place]) 08/17/2019  . Left ventricular apical thrombus without MI ([place]) 08/17/2019  . Acute CHF (congestive heart failure) ([place]) 08/17/2019  . Abnormal LFTs 08/16/2019  . Acute metabolic encephalopathy 56/38/7564  . SOB  (shortness of breath) 08/16/2019  . AKI (acute kidney injury) ([place]) 08/16/2019  . Fall 08/16/2019  . Lactic acidosis 08/16/2019  . Back pain 08/16/2019  . Liver failure without hepatic coma ([place]) 08/16/2019  . Multifocal pneumonia 08/16/2019  . Alcohol abuse   . Tobacco abuse   . Shortness of breath    Past Medical History:  Diagnosis Date  . Alcohol abuse   . CHF (congestive heart failure) ([place])    last EF 35-40%. etoh induced  . Coronary artery disease    80%RCA, 50%LCX, 40%LAD  . LV (left ventricular) mural thrombus   . Tobacco abuse     Family History  Problem Relation Age of Onset  . Heart disease Mother     Past Surgical History:  Procedure Laterality Date  . ABDOMINAL AORTOGRAM W/LOWER EXTREMITY N/A 08/19/2019   Procedure: ABDOMINAL AORTOGRAM W/LOWER EXTREMITY;  Surgeon: Serafina Mitchell, MD;  Location: [place] [facility];  Service: Cardiovascular;  Laterality: N/A;  . ABDOMINAL AORTOGRAM W/LOWER EXTREMITY Bilateral 08/20/2019   Procedure: ABDOMINAL AORTOGRAM W/LOWER EXTREMITY;  Surgeon: Marty Heck, MD;  Location: [place] [facility];  Service: Cardiovascular;  Laterality: Bilateral;  . ESOPHAGOGASTRODUODENOSCOPY N/A 08/26/2019   Procedure: ESOPHAGOGASTRODUODENOSCOPY (EGD);  Surgeon: Wilford Corner, MD;  Location: [place];  Service: Endoscopy;  Laterality: N/A;  . HOT HEMOSTASIS N/A 08/26/2019   Procedure: HOT HEMOSTASIS (ARGON PLASMA COAGULATION/BICAP);  Surgeon: Wilford Corner, MD;  Location: [place];  Service: Endoscopy;  Laterality: N/A;  . PERIPHERAL VASCULAR BALLOON ANGIOPLASTY  08/19/2019   Procedure: PERIPHERAL VASCULAR BALLOON ANGIOPLASTY;  Surgeon: Serafina Mitchell, MD;  Location: [place] [facility];  Service: Cardiovascular;;  Right femoral popliteal and PT   . PERIPHERAL VASCULAR INTERVENTION Left 08/20/2019   Procedure: PERIPHERAL VASCULAR INTERVENTION;  Surgeon: Marty Heck, MD;  Location: [place] [facility];  Service:  Cardiovascular;  Laterality: Left;  . RIGHT/LEFT HEART CATH AND CORONARY ANGIOGRAPHY N/A 09/02/2019   Procedure: RIGHT/LEFT HEART CATH AND CORONARY ANGIOGRAPHY;  Surgeon: Larey Dresser, MD;  Location: [place] [facility];  Service: Cardiovascular;  Laterality: N/A;  . SCLEROTHERAPY  08/26/2019   Procedure: SCLEROTHERAPY;  Surgeon: Wilford Corner, MD;  Location[facility] ENDOSCOPY;  Service: Endoscopy;;   Social History   Occupational History  . Not on file  Tobacco Use  . Smoking status: Current Every Day Smoker  . Smokeless tobacco: Never Used  Substance and Sexual Activity  . Alcohol use: Yes  . Drug use: Yes    Types: Marijuana  . Sexual activity: Not on file

## 2019-10-09 ENCOUNTER — Other Ambulatory Visit: Payer: Self-pay

## 2019-10-09 ENCOUNTER — Encounter (HOSPITAL_COMMUNITY): Payer: Self-pay | Admitting: Vascular Surgery

## 2019-10-09 ENCOUNTER — Other Ambulatory Visit: Payer: Self-pay | Admitting: Physician Assistant

## 2019-10-09 ENCOUNTER — Encounter (HOSPITAL_COMMUNITY): Payer: Self-pay | Admitting: *Deleted

## 2019-10-09 NOTE — Progress Notes (Signed)
Patient returned call stating she was unable to go get COVID test today. Informed patient to arrive at 7am tomorrow morning prior to surgery. Patient stated that she may not be able to arrive until 7:30am, but she would try to get here earlier.

## 2019-10-09 NOTE — Progress Notes (Signed)
SDW call completed  PCP -  Cardiologist - Aaron Edelman Agbor-Etang  Chest x-ray - n/a EKG - 10/06/19 Stress Test - denies  ECHO - 10/01/19 Cardiac Cath - 2020   Blood Thinner Instructions: Coumadin - Patient instructed to contact Dr Gavin Potters office, patient instructed to continue it if they do not hear from Dr Gavin Potters office  [place] TEST- Patient was unable to get covid test before surgery tomorrow, patient instructed to arrive at 7    Anesthesia review: yes  Patient denies shortness of breath, fever, cough and chest pain at PAT appointment   All instructions explained to the patient, with a verbal understanding of the material. Patient agrees to go over the instructions while at home for a better understanding. Patient also instructed to self quarantine after being tested for COVID-19. The opportunity to ask questions was provided.

## 2019-10-09 NOTE — Progress Notes (Signed)
Anesthesia Chart Review: SAME DAY WORK-UP    Patient is 52 year old female for right foot debridement by Dr. Aldean Baker, MD on 10/10/2019.  Anesthesia type: Choice.  Preop diagnosis: Gangrene right foot.  DISCUSSION: History includes former smoker (quit 06/28/19), alcohol abuse,  CVA (likely from LV thrombus--subacute right frontal lobe infarct and suspected small subacute infarcts involving right caudate nucleus and parietal white matter and chronic left cerebellar infarct 08/16/19 MRI), PAD (BLE ischemia, s/p mechanical thrombectomy of right SFA, popliteal, and PT arteries and angioplasty of right SFA and right PTA 08/19/19; s/p mechanical thrombectomy of left popliteal and PTA 08/20/19), CAD (consider PCI RCA if fails medical therapy 08/2019), chronic systolic CHF, cardiomyopathy (likely alcohol induced), GI bleed (duodenal ulcer, s/p injection/bipola cautery 08/26/19), DVT (right popliteal vein 08/17/19).   Prolonged hospitalization 08/15/19-09/13/19 (discharged to SNF until 09/27/19). Presented with altered mental status for ~ 1 week, history of ETOH abuse. Diagnosed with subacute right frontal CVA (llikely from LV thrombus), and found to have cardiomyopathy (EF 15-20% 08/16/19, 35-40% 08/31/19) with LV thrombus, popliteal artery occlusion, right popliteal vein DVT, cirrhosis with hepatic failure, and GI bleed. She underwent BLE thrombectomies 08/19/19 and 08/20/19 and treated with Heparin. She developed bleeding issues and required intubation 08/25/19. EGD 08/26/19 showed duodenal and gastric ulcers treated with injections and bipolar cautery (duodenal ulcer). Cardiac cath showed extensive CAD, with only potential target 80% proximal RCA (per 09/10/19 note by Marca Ancona, MD, "there is good flow down the RCA and I do not think intervention on this lesion would appreciably improve her LV function. She has not had chest pain.  Would avoid adding antiplatelet therapy with recent GI bleeding.Marland KitchenMarland KitchenIn future, once she  has recovered from GI bleeding, could intervene on RCA if she has exertional chest pain that does not respond to medical management."). Cardiomyopathy favored to be alcohol related rather than ischemic.  Converted to warfarin at discharge.   Last evaluated by cardiologist Debbe Odea, MD on 10/06/19 for hospital follow-up and to establish care. Started on Toprol, Lasix continued. Consider adding Entresto at next visit if BP allows. Continue warfarin for LV thrombus with plans for repeat echo in 3 months.   Seen by Dr. Lajoyce Corners 10/07/19. Significant right foot gangrene, "best treatment for foot salvage intervention would be surgical debridement placement of an installation wound VAC followed by repeat surgery with application of split-thickness skin graft and a cleanse choice wound VAC sponge.  Patient states she would like to proceed with surgery."  Currently patient remains on warfarin.    Patient for COVID-19 test on 10/10/19 at 7:00 AM (she could not go on 10/09/19)  Updated anesthesiologist Adonis Huguenin, MD regarding patient's co-moribidities and recent complex hospital course. Anesthesia team to evaluate on the day of surgery.   PROVIDERS: Patient, [name]. Referred to Duncan Dull, MD with [facility], but has not been scheduled yet.  Debbe Odea, MD is cardiologist. Established 10/07/19. Marca Ancona, MD is HF cardiologist. Last visit 09/29/19 with Tonye Becket, NP. - Vascular surgeon is with [facility] in [place] - She is scheduled for neurology hospital follow-up on 11/05/19 with Ihor Austin, NP [facility])  LABS: Lab results as of 09/29/19 include: Lab Results  Component Value Date   WBC 8.3 09/29/2019   HGB 10.2 (L) 09/29/2019   HCT 31.4 (L) 09/29/2019   PLT 680 (H) 09/29/2019   GLUCOSE 136 (H) 09/29/2019   ALT 33 09/05/2019   AST 36  09/05/2019   NA 129 (L) 09/29/2019   K 3.5 09/29/2019   CL 94 (L)  09/29/2019   CREATININE 1.00 09/29/2019   BUN 13 09/29/2019   CO2 24 09/29/2019   INR 1.9 (A) 10/06/2019   HGBA1C 5.8 (H) 08/17/2019   INR 1.9 on 10/06/19.    IMAGES: 1V PCXR 08/25/19: FINDINGS: - Patient is rotated to the right. Endotracheal tube has tip 2.9 cm above the carina. Enteric tube courses into the stomach and off the film as tip is not visualized. Right IJ central venous catheter unchanged with tip over the SVC. - Persistent moderate size right pleural effusion and small left pleural effusion likely with associated bibasilar atelectasis. Infection in the lung bases is possible. Stable cardiomegaly. Remainder of the exam is unchanged. IMPRESSION: - Stable moderate right effusion and small left effusion likely with associated bibasilar atelectasis. Infection in the lung bases is possible. - Stable cardiomegaly. - Tubes and lines as described.   MRA head/neck 08/16/19: IMPRESSION: HEAD: 1. Markedly motion degraded examination. 2. No emergent large vessel occlusion or high-grade proximal stenosis. NECK: Severely motion limited study. The common carotid arteries and vertebral artery V2 segments are patent. Visualization of the internal carotid arteries is markedly limited. Consider repeat examination with contrast when the patient is better able to follow instructions.  CT L-spine 08/04/19: IMPRESSION: 1. Acute, comminuted L1 body fracture with mild noncompressive retropulsion. 2. Advanced lower lumbar degenerative disease with L4-5 anterolisthesis and moderate spinal/right foraminal stenosis. 3. Partial coverage of small pleural effusions.  EKG: 10/06/19 ([facility]): Sinus tachycardia at 107 bpm.  ST and T wave abnormality, consider inferiolateral ischemia.   CV: Cardiac cath 09/02/19:  1. Optimized filling pressures.  2. Mild pulmonary hypertension.  3. Preserved cardiac output.  4. Extensive CAD, primarily distal/branch vessel disease.  The only  interventional target would be 80% proximal RCA stenosis.   I suspect that she primarily has a nonischemic cardiomyopathy, likely from ETOH abuse.  However, extensive distal/branch vessel coronary disease may contribute.  Once she has recovered from the current episode and no further bleeding, could consider RCA PCI if she has exertional symptoms despite medical therapy.     Echo 08/31/19: IMPRESSIONS  1. Left ventricular ejection fraction, by visual estimation, is 35 to 40%. The left ventricle has moderately decreased function. Normal left ventricular size. Left ventricular septal wall thickness was moderately increased. Moderately increased left  ventricular posterior wall thickness. There is moderately increased left ventricular hypertrophy.  2. Large, fixed thrombus on the apical wall of the left ventricle.  3. Left ventricular diastolic Doppler parameters are consistent with pseudonormalization pattern of LV diastolic filling.  4. LV thrombus slightly smaller than previously noted 07/2019. Systolic function has improved.  5. Global right ventricle has normal systolic function.The right ventricular size is normal. No increase in right ventricular wall thickness.  6. Left atrial size was mildly dilated.  7. Right atrial size was normal.  8. The mitral valve is normal in structure. Trace mitral valve regurgitation. No evidence of mitral stenosis.  9. The tricuspid valve is normal in structure. Tricuspid valve regurgitation is trivial. 10. The aortic valve is tricuspid Aortic valve regurgitation was not visualized by color flow Doppler. Structurally normal aortic valve, with no evidence of sclerosis or stenosis. 11. Aortic valve leaflets mildly thickened and calcified. 12. The pulmonic valve was normal in structure. Pulmonic valve regurgitation is not visualized by color flow Doppler. 13. Normal pulmonary artery systolic pressure. 14. The inferior vena cava is  normal in size with greater than  50% respiratory variability, suggesting right atrial pressure of 3 mmHg. (Comparison 08/16/19: LVEF 15-20%, diffuse hypokinesis, large fixed thrombus on the apical septal wall of the LV measuring 3.1 x 1.4 cm, average left ventricular global longitudinal strain is -4.0 %mild-moderate MR, possibly functionally bicuspid AV with trivial AR, no AS, global RV has severely reduced systolic function.)    Carotid US 08/17/19: Summary: Right Carotid: Velocities in the right ICA are consistent with a 1-39% stenosis. Left Carotid: Velocities in the left ICA are consistent with a 1-39% stenosis. Vertebrals:  Bilateral vertebral arteries demonstrate antegrade flow. Subclavians: Normal flow hemodynamics were seen in bilateral subclavian              arteries.   Past Medical History:  Diagnosis Date  . Alcohol abuse   . CHF (congestive heart failure) (HCC)    last EF 35-40%. etoh induced  . Coronary artery disease    80%RCA, 50%LCX, 40%LAD  . DVT (deep venous thrombosis) (HCC)    right popliteal vein (08/17/19)  . GERD (gastroesophageal reflux disease)   . LV (left ventricular) mural thrombus   . Stroke (HCC)   . Tobacco abuse     Past Surgical History:  Procedure Laterality Date  . ABDOMINAL AORTOGRAM W/LOWER EXTREMITY N/A 08/19/2019   Procedure: ABDOMINAL AORTOGRAM W/LOWER EXTREMITY;  Surgeon: Nada Libman, MD;  Location: [facility];  Service: Cardiovascular;  Laterality: N/A;  . ABDOMINAL AORTOGRAM W/LOWER EXTREMITY Bilateral 08/20/2019   Procedure: ABDOMINAL AORTOGRAM W/LOWER EXTREMITY;  Surgeon: Cephus Shelling, MD;  Location: [facility];  Service: Cardiovascular;  Laterality: Bilateral;  . ESOPHAGOGASTRODUODENOSCOPY N/A 08/26/2019   Procedure: ESOPHAGOGASTRODUODENOSCOPY (EGD);  Surgeon: Charlott Rakes, MD;  Location: [facility] ENDOSCOPY;  Service: Endoscopy;  Laterality: N/A;  . HOT HEMOSTASIS N/A 08/26/2019   Procedure: HOT HEMOSTASIS (ARGON PLASMA COAGULATION/BICAP);   Surgeon: Charlott Rakes, MD;  Location: [facility] ENDOSCOPY;  Service: Endoscopy;  Laterality: N/A;  . PERIPHERAL VASCULAR BALLOON ANGIOPLASTY  08/19/2019   Procedure: PERIPHERAL VASCULAR BALLOON ANGIOPLASTY;  Surgeon: Nada Libman, MD;  Location: [facility];  Service: Cardiovascular;;  Right femoral popliteal and PT   . PERIPHERAL VASCULAR INTERVENTION Left 08/20/2019   Procedure: PERIPHERAL VASCULAR INTERVENTION;  Surgeon: Cephus Shelling, MD;  Location: [facility] INVAS[facility] LAB;  Service: Cardiovascular;  Laterality: Left;  . RIGHT/LEFT HEART CATH AND CORONARY ANGIOGRAPHY N/A 09/02/2019   Procedure: RIGHT/LEFT HEART CATH AND CORONARY ANGIOGRAPHY;  Surgeon: Laurey Morale, MD;  Location: [facility] INVASIVE CV LAB;  Service: Cardiovascular;  Laterality: N/A;  . SCLEROTHERAPY  08/26/2019   Procedure: SCLEROTHERAPY;  Surgeon: Charlott Rakes, MD;  Location: [facility] ENDOSCOPY;  Service: Endoscopy;;    MEDICATIONS: No current facility-administered medications for this encounter.    . digoxin (LANOXIN) 0.125 MG tablet  . folic acid (FOLVITE) 1 MG tablet  . furosemide (LASIX) 20 MG tablet  . ibuprofen (ADVIL) 200 MG tablet  . metoprolol succinate (TOPROL-XL) 25 MG 24 hr tablet  . pantoprazole (PROTONIX) 40 MG tablet  . rosuvastatin (CRESTOR) 5 MG tablet  . warfarin (COUMADIN) 2.5 MG tablet     Shonna Chock, PA-C Surgical Short Stay/Anesthesiology [facility] Phone [phone] [facility] Phone [phone] 10/09/2019 4:48 PM

## 2019-10-09 NOTE — Anesthesia Preprocedure Evaluation (Deleted)
Anesthesia Evaluation    Airway        Dental   Pulmonary former smoker,           Cardiovascular      Neuro/Psych    GI/Hepatic   Endo/Other    Renal/GU      Musculoskeletal   Abdominal   Peds  Hematology   Anesthesia Other Findings   Reproductive/Obstetrics                             Anesthesia Physical Anesthesia Plan  ASA:   Anesthesia Plan:    Post-op Pain Management:    Induction:   PONV Risk Score and Plan:   Airway Management Planned:   Additional Equipment:   Intra-op Plan:   Post-operative Plan:   Informed Consent:   Plan Discussed with:   Anesthesia Plan Comments: (See PAT note written 10/09/2019 by Myra Gianotti, PA-C. SAME DAY WORK-UP. Recent month long hospitalization. On warfarin (CVA, LV thrombus, DVT).    )        Anesthesia Quick Evaluation

## 2019-10-10 ENCOUNTER — Encounter (HOSPITAL_COMMUNITY)
Admission: RE | Disposition: A | Discharge status: Home / Self Care | Payer: Self-pay | Source: Home / Self Care | Attending: Orthopedic Surgery

## 2019-10-10 ENCOUNTER — Ambulatory Visit (HOSPITAL_COMMUNITY)
Admission: RE | Admit: 2019-10-10 | Discharge: 2019-10-10 | Disposition: A | Discharge status: Home / Self Care | Payer: 59 | Attending: Orthopedic Surgery | Admitting: Orthopedic Surgery

## 2019-10-10 HISTORY — DX: Cerebral infarction, unspecified: I63.9

## 2019-10-10 HISTORY — DX: Gastro-esophageal reflux disease without esophagitis: K21.9

## 2019-10-10 HISTORY — DX: Acute embolism and thrombosis of unspecified deep veins of unspecified lower extremity: I82.409

## 2019-10-10 SURGERY — IRRIGATION AND DEBRIDEMENT EXTREMITY
Anesthesia: Choice | Laterality: Right

## 2019-10-10 NOTE — H&P (Signed)
Briana Andrews is an 53 y.o. female.   Chief Complaint: Gangrene R Foot HPI: Patient is a 53 year old woman who was seen for a large necrotic wound on the dorsal aspect of the right foot she states she has some drainage and odor.  Patient states that her pain is 2-3 over 10.  Patient states that this ulcer started in September.  Patient states that she had multiple medical issues she had a stroke she had renal failure with multisystem failure.  Patient does not know if she fell on her foot had blunt trauma or if her foot was injected.  Patient states she is recently stopped smoking and stop drinking. . Past Medical History:  Diagnosis Date  . Alcohol abuse   . CHF (congestive heart failure) ([place])    last EF 35-40%. etoh induced  . Coronary artery disease    80%RCA, 50%LCX, 40%LAD  . DVT (deep venous thrombosis) ([facility])    right popliteal vein (08/17/19)  . GERD (gastroesophageal reflux disease)   . LV (left ventricular) mural thrombus   . Stroke (Briana Andrews)   . Tobacco abuse     Past Surgical History:  Procedure Laterality Date  . ABDOMINAL AORTOGRAM W/LOWER EXTREMITY N/A 08/19/2019   Procedure: ABDOMINAL AORTOGRAM W/LOWER EXTREMITY;  Surgeon: Serafina Mitchell, MD;  Location: [facility];  Service: Cardiovascular;  Laterality: N/A;  . ABDOMINAL AORTOGRAM W/LOWER EXTREMITY Bilateral 08/20/2019   Procedure: ABDOMINAL AORTOGRAM W/LOWER EXTREMITY;  Surgeon: Marty Heck, MD;  Location: [facility];  Service: Cardiovascular;  Laterality: Bilateral;  . ESOPHAGOGASTRODUODENOSCOPY N/A 08/26/2019   Procedure: ESOPHAGOGASTRODUODENOSCOPY (EGD);  Surgeon: Wilford Corner, MD;  Location: [facility];  Service: Endoscopy;  Laterality: N/A;  . HOT HEMOSTASIS N/A 08/26/2019   Procedure: HOT HEMOSTASIS (ARGON PLASMA COAGULATION/BICAP);  Surgeon: Wilford Corner, MD;  Location: [facility];  Service: Endoscopy;  Laterality: N/A;  . PERIPHERAL VASCULAR BALLOON ANGIOPLASTY  08/19/2019   Procedure: PERIPHERAL VASCULAR BALLOON ANGIOPLASTY;  Surgeon: Serafina Mitchell, MD;  Location: [facility];  Service: Cardiovascular;;  Right femoral popliteal and PT   . PERIPHERAL VASCULAR INTERVENTION Left 08/20/2019   Procedure: PERIPHERAL VASCULAR INTERVENTION;  Surgeon: Marty Heck, MD;  Location: [facility];  Service: Cardiovascular;  Laterality: Left;  . RIGHT/LEFT HEART CATH AND CORONARY ANGIOGRAPHY N/A 09/02/2019   Procedure: RIGHT/LEFT HEART CATH AND CORONARY ANGIOGRAPHY;  Surgeon: Larey Dresser, MD;  Location: [facility];  Service: Cardiovascular;  Laterality: N/A;  . SCLEROTHERAPY  08/26/2019   Procedure: SCLEROTHERAPY;  Surgeon: Wilford Corner, MD;  Location: [facility];  Service: Endoscopy;;    Family History  Problem Relation Age of Onset  . Heart disease Mother    Social History:  reports that she quit smoking about 3 months ago. She has never used smokeless tobacco. She reports previous alcohol use. She reports previous drug use. Drug: Marijuana.  Allergies:  Allergies  Allergen Reactions  . Penicillins Other (See Comments)    Unsure of reaction Did it involve swelling of the face/tongue/throat, SOB, or low BP? Unknown Did it involve sudden or severe rash/hives, skin peeling, or any reaction on the inside of your mouth or nose? Unknown Did you need to seek medical attention at a hospital or doctor's office? Unknown When did it last happen?[place] If all above answers are "NO", may proceed with cephalosporin use.    No medications prior to admission.    No results found for this or any previous visit (from the  past 48 hour(s)). No results found.  Review of Systems  All other systems reviewed and are negative.   There were no vitals taken for this visit. Physical Exam  Patient is alert, oriented, no adenopathy, well-dressed, normal affect, normal respiratory effort. Examination patient has a black full-thickness  eschar that covers the entire dorsum of her foot it is 15 cm x 10 cm.  Using the Doppler she has a triphasic dorsalis pedis and posterior tibial pulse.  There are no plantar ulcers. Assessment/Plan 1. Gangrene of right foot (HCC)     Plan: Discussed with the patient the best treatment for foot salvage intervention would be surgical debridement placement of an installation wound VAC followed by repeat surgery with application of split-thickness skin graft and a cleanse choice wound VAC sponge.  Patient states she would like to proceed with surgery.   [place] Cavin Longman, PA 10/10/2019, 7:09 AM

## 2019-10-10 NOTE — Progress Notes (Signed)
Patient was unable to complete Covid testing due to transportation and rapid testing is currently unavailable.  Dr. Sharol Given stated that case needs to be rescheduled and patient will have to go to drive thru testing.   Patient aware and instructed that she will have to go to covid testing prior to rescheduled surgery.

## 2019-10-13 ENCOUNTER — Other Ambulatory Visit: Payer: Self-pay | Admitting: Physician Assistant

## 2019-10-14 ENCOUNTER — Ambulatory Visit (HOSPITAL_COMMUNITY)
Admission: RE | Admit: 2019-10-14 | Discharge: 2019-10-14 | Disposition: A | Discharge status: Home / Self Care | Payer: 59 | Source: Ambulatory Visit | Attending: Adult Health | Admitting: Adult Health

## 2019-10-14 ENCOUNTER — Encounter (HOSPITAL_COMMUNITY): Payer: Self-pay | Admitting: *Deleted

## 2019-10-14 ENCOUNTER — Other Ambulatory Visit: Payer: Self-pay

## 2019-10-14 ENCOUNTER — Encounter (HOSPITAL_COMMUNITY): Payer: Self-pay

## 2019-10-14 ENCOUNTER — Other Ambulatory Visit (HOSPITAL_COMMUNITY)
Admission: RE | Admit: 2019-10-14 | Discharge: 2019-10-14 | Disposition: A | Discharge status: Home / Self Care | Payer: 59 | Source: Ambulatory Visit | Attending: Orthopedic Surgery | Admitting: Orthopedic Surgery

## 2019-10-14 VITALS — BP 113/65 | HR 98 | Wt 138.0 lb

## 2019-10-14 DIAGNOSIS — K7040 Alcoholic hepatic failure without coma: Secondary | ICD-10-CM | POA: Insufficient documentation

## 2019-10-14 DIAGNOSIS — Z8711 Personal history of peptic ulcer disease: Secondary | ICD-10-CM | POA: Diagnosis not present

## 2019-10-14 DIAGNOSIS — K219 Gastro-esophageal reflux disease without esophagitis: Secondary | ICD-10-CM | POA: Insufficient documentation

## 2019-10-14 DIAGNOSIS — R5381 Other malaise: Secondary | ICD-10-CM

## 2019-10-14 DIAGNOSIS — I5022 Chronic systolic (congestive) heart failure: Secondary | ICD-10-CM | POA: Insufficient documentation

## 2019-10-14 DIAGNOSIS — Z7901 Long term (current) use of anticoagulants: Secondary | ICD-10-CM | POA: Diagnosis not present

## 2019-10-14 DIAGNOSIS — Z87891 Personal history of nicotine dependence: Secondary | ICD-10-CM | POA: Insufficient documentation

## 2019-10-14 DIAGNOSIS — I739 Peripheral vascular disease, unspecified: Secondary | ICD-10-CM

## 2019-10-14 DIAGNOSIS — Z86718 Personal history of other venous thrombosis and embolism: Secondary | ICD-10-CM | POA: Diagnosis not present

## 2019-10-14 DIAGNOSIS — Z20828 Contact with and (suspected) exposure to other viral communicable diseases: Secondary | ICD-10-CM | POA: Insufficient documentation

## 2019-10-14 DIAGNOSIS — I2510 Atherosclerotic heart disease of native coronary artery without angina pectoris: Secondary | ICD-10-CM | POA: Insufficient documentation

## 2019-10-14 DIAGNOSIS — Z8249 Family history of ischemic heart disease and other diseases of the circulatory system: Secondary | ICD-10-CM | POA: Insufficient documentation

## 2019-10-14 DIAGNOSIS — I240 Acute coronary thrombosis not resulting in myocardial infarction: Secondary | ICD-10-CM | POA: Diagnosis not present

## 2019-10-14 DIAGNOSIS — K7010 Alcoholic hepatitis without ascites: Secondary | ICD-10-CM | POA: Diagnosis not present

## 2019-10-14 DIAGNOSIS — Z8673 Personal history of transient ischemic attack (TIA), and cerebral infarction without residual deficits: Secondary | ICD-10-CM | POA: Diagnosis not present

## 2019-10-14 DIAGNOSIS — Z79899 Other long term (current) drug therapy: Secondary | ICD-10-CM | POA: Diagnosis not present

## 2019-10-14 DIAGNOSIS — F1011 Alcohol abuse, in remission: Secondary | ICD-10-CM | POA: Diagnosis not present

## 2019-10-14 DIAGNOSIS — Z01812 Encounter for preprocedural laboratory examination: Secondary | ICD-10-CM | POA: Diagnosis present

## 2019-10-14 LAB — SARS CORONAVIRUS 2 (TAT 6-24 HRS): SARS Coronavirus 2: NEGATIVE

## 2019-10-14 NOTE — Anesthesia Preprocedure Evaluation (Addendum)
Anesthesia Evaluation  Patient identified by MRN, date of birth, ID band Patient awake    Reviewed: Allergy & Precautions, H&P , NPO status , Patient's Chart, lab work & pertinent test results  Airway Mallampati: I  TM Distance: >3 FB Neck ROM: Full    Dental no notable dental hx. (+) Teeth Intact, Dental Advisory Given   Pulmonary neg pulmonary ROS, former smoker,    Pulmonary exam normal breath sounds clear to auscultation       Cardiovascular Exercise Tolerance: Good + CAD, + Peripheral Vascular Disease and +CHF  negative cardio ROS Normal cardiovascular exam Rhythm:Regular Rate:Normal  Cardiac cath 09/02/19: 1. Optimized filling pressures.  2. Mild pulmonary hypertension.  3. Preserved cardiac output.  4. Extensive CAD, primarily distal/branch vessel disease. The only interventional target would be 80% proximal RCA stenosis.   Suspect primarily has a nonischemic cardiomyopathy, likely from ETOH abuse. However, extensive distal/branch vessel coronary disease may contribute. Once recovered from the current episode and no further bleeding, could consider RCA PCI if she has exertional symptoms despite medical therapy.  Echo 08/31/19: IMPRESSIONS 1. Left ventricular ejection fraction, by visual estimation, is 35 to 40%. The left ventricle has moderately decreased function. Normal left ventricular size. Left ventricular septal wall thickness was moderately increased. Moderately increased left  ventricular posterior wall thickness. There is moderately increased left ventricular hypertrophy. 2. Large, fixed thrombus on the apical wall of the left ventricle. 3. Left ventricular diastolic Doppler parameters are consistent with pseudonormalization pattern of LV diastolic filling. 4. LV thrombus slightly smaller than previously noted 07/2019. Systolic function has improved.  (Comparison 08/16/19: LVEF 15-20%, diffuse hypokinesis,  large fixed thrombus on the apical septal wall of the LV measuring 3.1 x 1.4 cm,average left ventricular global longitudinal strain is -4.0 %mild-moderate MR, possibly functionally bicuspid AV with trivial AR, no AS, global RV has severely reduced systolic function.)     Neuro/Psych PSYCHIATRIC DISORDERS Depression Carotid US 08/17/19: Summary: Right Carotid: Velocities in the right ICA are consistent with a 1-39% stenosis. Left Carotid: Velocities in the left ICA are consistent with a 1-39% stenosis. Vertebrals: Bilateral vertebral arteries demonstrate antegrade flow. Subclavians: Normal flow hemodynamics were seen in bilateral subclavian arteries. CVA negative neurological ROS  negative psych ROS   GI/Hepatic negative GI ROS, Neg liver ROS, GERD  Medicated,(+) Cirrhosis     substance abuse  alcohol use, Hepatitis -  Endo/Other  negative endocrine ROS  Renal/GU Renal diseasenegative Renal ROS  negative genitourinary   Musculoskeletal negative musculoskeletal ROS (+)   Abdominal   Peds negative pediatric ROS (+)  Hematology negative hematology ROS (+)   Anesthesia Other Findings   Reproductive/Obstetrics negative OB ROS                           Anesthesia Physical Anesthesia Plan  ASA: IV  Anesthesia Plan: General   Post-op Pain Management:    Induction: Intravenous  PONV Risk Score and Plan: 3 and Ondansetron  Airway Management Planned: Oral ETT and LMA  Additional Equipment:   Intra-op Plan:   Post-operative Plan: Extubation in OR  Informed Consent: I have reviewed the patients History and Physical, chart, labs and discussed the procedure including the risks, benefits and alternatives for the proposed anesthesia with the patient or authorized representative who has indicated his/her understanding and acceptance.       Plan Discussed with: CRNA, Surgeon and Anesthesiologist  Anesthesia Plan Comments: (See PAT  note written by  Myra Gianotti, PA-C. SAME DAY WORK-UP. Recent month long hospitalization. On warfarin (CVA, LV thrombus, DVT). Recent cardiology follow-up (primary and HF, [facility]).)      Anesthesia Quick Evaluation

## 2019-10-14 NOTE — Progress Notes (Signed)
PCP: None  Primary Cardiologist: Dr Shirlee Latch Vascular : Dr Darrick Penna  Ortho: Dr Lajoyce Corners  [facility]   HPI: [name] is a 53 yo alcoholic woman with a history of R frontal CVA, B popliteal artery occlusions, hepatic failure, GI bleeding, cirrhosis, ETOH abuse, and chronic systolic heart failure.  Admitted 08/15/19 with increased sob and confusion. Treated for PNA and MRI brain showed R ACA stroke. Hospital course complicated new onset acute systolic heart failure likely in the setting of ETOH abuse. Hospital course complicated by LV thrombus, cirrhosis, ARF, lower extremity thrombus, and shock.  Received PRBC and was able to undergo EGD 9/29 that showed duodenal and gastric ulcers which were treated endoscopically. Continued on PPI. Heparin has been converted to coumadin. R foot wound was to be followed by Vascular in the community  Discharged to SNF. On 09/21/19 she presented to the [facility] after a fall that resulted in head laceration. CT scan was unremarkable. She left SNF on 09/27/19 because she was not taken to any of her follow up appointments.  She asked for a copy of her medications however she said they did not give it to her.      She has had follow up with [facility]/Ortho for R foot wound and will has debridement 10/14/19.   Today she returns for HF follow up. Overall feeling fine. Denies SOB/PND/Orthopnea. Appetite ok. No fever or chills. Unable to weight at home due to balance issues. No bleeding issues. No longer drinking alcohol. No chest pain. Taking all medications.   1. Systolic Heart Failure  9/20 ECHO EF 15-20% Fixed thrombus  LHC 09/02/19 LAD 40%, Left Circumflex 50% , RCA 80% Proximal RCA 60% mid PDA stenosis RHC 10/6/320 RA 6 CO 7 CI 3.7 PVR 3.7  2. GI Bleed 3. R frontal CVA 4. Bilateral Popliteal Occlusions  S/P Thrombectomy  5. Hepatic Failure  6. ETOH Abuse  7. Liver Failure 8. LV thrombus    ROS: All systems negative except as listed in HPI, PMH and Problem List.  SH:  Social History   Socioeconomic History  . Marital status: Married    Spouse name: Not on file  . Number of children: Not on file  . Years of education: Not on file  . Highest education level: Not on file  Occupational History  . Not on file  Social Needs  . Financial resource strain: Not on file  . Food insecurity    Worry: Not on file    Inability: Not on file  . Transportation needs    Medical: Not on file    Non-medical: Not on file  Tobacco Use  . Smoking status: Former Smoker    Quit date: 06/2019    Years since quitting: 0.2  . Smokeless tobacco: Never Used  Substance and Sexual Activity  . Alcohol use: Not Currently    Comment: alcoholism   . Drug use: Not Currently    Types: Marijuana  . Sexual activity: Not on file  Lifestyle  . Physical activity    Days per week: Not on file    Minutes per session: Not on file  . Stress: Not on file  Relationships  . Social Musician on phone: Not on file    Gets together: Not on file    Attends religious service: Not on file    Active member of club or organization: Not on file    Attends meetings of clubs or organizations: Not on file  Relationship status: Not on file  . Intimate partner violence    Fear of current or ex partner: Not on file    Emotionally abused: Not on file    Physically abused: Not on file    Forced sexual activity: Not on file  Other Topics Concern  . Not on file  Social History Narrative  . Not on file    FH:  Family History  Problem Relation Age of Onset  . Heart disease Mother     Past Medical History:  Diagnosis Date  . Alcohol abuse   . CHF (congestive heart failure) ([place])    last EF 35-40%. etoh induced  . Coronary artery disease    80%RCA, 50%LCX, 40%LAD  . DVT (deep venous thrombosis) ([facility])    right popliteal vein (08/17/19)  . GERD (gastroesophageal reflux disease)   . LV (left ventricular) mural thrombus   . Stroke ([place])   . Tobacco abuse      Current Outpatient Medications  Medication Sig Dispense Refill  . digoxin (LANOXIN) 0.125 MG tablet Take 1 tablet (0.125 mg total) by mouth every other day. (Patient taking differently: Take 0.125 mg by mouth every other day. In the evening) 30 tablet 5  . folic acid (FOLVITE) 1 MG tablet Take 1 tablet (1 mg total) by mouth daily. (Patient taking differently: Take 1 mg by mouth every evening. ) 30 tablet 5  . furosemide (LASIX) 20 MG tablet Take 1 tablet (20 mg total) by mouth daily. (Patient taking differently: Take 20 mg by mouth every evening. ) 30 tablet 6  . ibuprofen (ADVIL) 200 MG tablet Take 200 mg by mouth every 6 (six) hours as needed for headache or moderate pain.    . metoprolol succinate (TOPROL-XL) 25 MG 24 hr tablet Take 1 tablet (25 mg total) by mouth daily. (Patient taking differently: Take 25 mg by mouth every evening. ) 30 tablet 6  . pantoprazole (PROTONIX) 40 MG tablet Take 1 tablet (40 mg total) by mouth daily. (Patient taking differently: Take 40 mg by mouth every evening. ) 30 tablet 5  . rosuvastatin (CRESTOR) 5 MG tablet Take 1 tablet (5 mg total) by mouth daily at 6 PM. (Patient taking differently: Take 5 mg by mouth every evening. ) 90 tablet 3  . warfarin (COUMADIN) 2.5 MG tablet Take 1 tablet (2.5 mg total) by mouth daily at 6 PM. (Patient taking differently: Take 2.5 mg by mouth daily at 6 PM. Take 3.75 on Monday and Friday Take 2.5 mg all the the other days) 30 tablet 5   No current facility-administered medications for this encounter.     Vitals:   10/14/19 1139  BP: 113/65  Pulse: 98  SpO2: 100%  Weight: 62.6 kg (138 lb)   Wt Readings from Last 3 Encounters:  10/14/19 62.6 kg (138 lb)  10/07/19 67.1 kg (148 lb)  10/06/19 67.1 kg (148 lb)    PHYSICAL EXAM: General:   No resp difficulty. Arrived in wheel chair.  HEENT: normal Neck: supple. no JVD. Carotids 2+ bilat; no bruits. No lymphadenopathy or thryomegaly appreciated. Cor: PMI nondisplaced.  Regular rate & rhythm. No rubs, gallops or murmurs. Lungs: clear Abdomen: soft, nontender, nondistended. No hepatosplenomegaly. No bruits or masses. Good bowel sounds. Extremities: no cyanosis, clubbing, rash, edema. RLE dressing.  Neuro: alert & orientedx3, cranial nerves grossly intact. moves all 4 extremities w/o difficulty. Affect pleasant  ASSESSMENT & PLAN: 1. Chronic systolic CHF Recent cardiogenic shock: Echo 07/2019 with EF 15-20%,  mild LV dilation, LV thrombus, mild-moderate MR, severely decreased RV systolic function.  LHC/RHC done 10/6 showing normal filling pressures and preserved cardiac output.  There was diffuse coronary disease, especially involving distal and branch vessels, only possible interventional target was 80% proximal RCA stenosis.  No intervention at this time given good flow down the vessel and unlikely to significantly improve LV function.  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   NYHA class difficult to assess due to RLE wound. Suspect NYHA II.    Volume status appears low. Weight down 10 pounds. Hold lasix and can restart after surgery.  - Continue digoxin 0.125.  -  Continue Toprol XL 25 mg at bed time.  - Not candidate for advanced therapies with ETOH abuse/liver failure.  2. Liver failure: ETOH hepatitis. Markedly elevated bilirubin initially with jaundice. However liver imaging with [place] and CT has NOT shown cirrhosis or portal/hepatic vein thrombosis. Question has arisen whether this might possibly be due primarily to RV failure. Time course seems quite acute for RV failure. Needs follow up with GI.    -Continue protonix 40 mg daily.  3.  LV thrombus: Possible cause of embolic event to popliteal arteries as well as subacute CVA.  -Hold coumadin tonight for debridement in the morning.    -[facility] in [place].  6. PAD: Bilateral popliteal artery occlusions, suspect  cardioembolic from LV thrombus. Now s/p mechanical thrombectomy by vascular. R Foot Dorsum with thick black eschar. Followed [facility]/Ortho. Plan for debridement tomorrow.  - Off anticoagulation.  7. 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  - Continue anticoagulation.  8. Right popliteal vein DVT -Restart coumadin today. .  9. H/O GI bleeding: Upper GI bleeding, EGD with bleeding duodenal and gastric ulcer on 9/29 that were treated, no varices. She had 1 unit PRBCs on 9/30 and 1 unit on 10/2.  - Continue Protonix.  10. H/O ETOH abuse: Per husband, at least a 6 pack daily and usually more prior to admission.  11. Deconditioning:  Referred to HHPT  12. CAD: Extensive distal and branch vessel coronary disease on cath 10/6.  Only possible interventional target was 80% proximal RCA stenosis.  However, there is good flow down the RCA and I do not think intervention on this lesion would appreciably improve her LV function.   -No chest pain.  -Continue  Crestor 5 mg daily.  Hold coumadin for surgery and can restart once ortho clears.    Follow up with Dr Shirlee Latch in 6 weeks. - HF Team will consult once she is admitted.   Amy Clegg NP-C  11:40 AM

## 2019-10-14 NOTE — Progress Notes (Signed)
Anesthesia Chart Review:  Case: [id number] Date/Time: 10/15/19 5790   Procedure: RIGHT FOOT DEBRIDEMENT (Right )   Anesthesia type: Choice   Pre-op diagnosis: Gangrene Right Foot   [facility] / [facility]   Surgeon: Nadara Mustard, MD      DISCUSSION: Patient is a 53 year old female scheduled for the above procedure. (See my note signed 10/09/19.) Surgery was initially scheduled for 10/10/19, but surgery was rescheduled due to patient not being able to get a preoperative COVID-19 test due to transportation issues. She did have a COVID-19 test on 10/14/19, but results are still in process.  History includes former smoker (quit 06/28/19), alcohol abuse,  CVA (likely from LV thrombus--subacute right frontal lobe infarct and suspected small subacute infarcts involving right caudate nucleus and parietal white matter and chronic left cerebellar infarct 08/16/19 MRI), PAD (BLE ischemia, s/p mechanical thrombectomy of right SFA, popliteal, and PT arteries and angioplasty of right SFA and right PTA 08/19/19; s/p mechanical thrombectomy of left popliteal and PTA 08/20/19), CAD (consider PCI RCA if fails medical therapy 08/2019), chronic systolic CHF, cardiomyopathy (likely alcohol induced, although CAD may play a role), GI bleed (duodenal ulcer, s/p injection/bipola cautery 08/26/19), DVT (right popliteal vein 08/17/19).   Prolonged hospitalization 08/15/19-09/13/19 (discharged to SNF until 09/27/19). Presented with altered mental status for ~ 1 week, history of ETOH abuse. Diagnosed with subacute right frontal CVA (llikely from LV thrombus), and found to have cardiomyopathy (EF 15-20% 08/16/19, 35-40% 08/31/19) with LV thrombus, popliteal artery occlusion, right popliteal vein DVT, cirrhosis with hepatic failure, and GI bleed. She underwent BLE thrombectomies 08/19/19 and 08/20/19 and treated with Heparin. She developed bleeding issues and required intubation 08/25/19. EGD 08/26/19 showed duodenal and gastric ulcers  treated with injections and bipolar cautery (duodenal ulcer). Cardiac cath showed extensive CAD, with only potential target 80% proximal RCA (per 09/10/19 note by Marca Ancona, MD, "there is good flow down the RCA and I do not think intervention on this lesion would appreciably improve her LV function. She has not had chest pain. Would avoid adding antiplatelet therapy with recent GI bleeding.Marland KitchenMarland KitchenIn future, once she has recovered from GI bleeding, could intervene on RCA if she has exertional chest pain that does not respond to medical management."). Cardiomyopathy favored to be alcohol related rather than ischemic.  Converted to warfarin at discharge.   Established with cardiologist Debbe Odea, MD on 10/06/19 for hospital follow-up. Plan for repeat echo in 3 months. Seen at [facility] by Tonye Becket, NP on 10/14/19. She is aware of surgery plans. Volume status appeared low, so Lasix held until after surgery. Continue digoxin and Toprol. Not a candidate for advanced therapies with ETOH abuse/liver failure. Liver failure initially thought likely ETOH hepatitis; however, imaging did not show cirrhosis or protal/hepatic vein thrombosis, so question if due primarily to RV failure. There is mention of holding warfarin tonight and restarting post-operatively once cleared by Ortho. Notes states, "HF Team will consult once she is admitted."    Anesthesia team to evaluate on the day of surgery.     PROVIDERS: Patient, [name]. Referred to Duncan Dull, MD with [facility], but has not been scheduled yet.  Debbe Odea, MD is cardiologist. Established 10/07/19. Marca Ancona, MD is HF cardiologist. Last visit 09/1719 with Tonye Becket, NP. - Vascular surgeon is with [facility] in [place] - She is scheduled for neurology hospital follow-up on 11/05/19 with Ihor Austin, NP (  [facility])    LABS: Currently last lab results  include: Lab Results  Component Value Date   WBC 8.3 09/29/2019   HGB 10.2 (L) 09/29/2019   HCT 31.4 (L) 09/29/2019   PLT 680 (H) 09/29/2019   GLUCOSE 136 (H) 09/29/2019   ALT 33 09/05/2019   AST 36 09/05/2019   NA 129 (L) 09/29/2019   K 3.5 09/29/2019   CL 94 (L) 09/29/2019   CREATININE 1.00 09/29/2019   BUN 13 09/29/2019   CO2 24 09/29/2019   INR 1.9 (A) 10/06/2019   HGBA1C 5.8 (H) 08/17/2019     IMAGES: 1V PCXR 08/25/19: FINDINGS: - Patient is rotated to the right. Endotracheal tube has tip 2.9 cm above the carina. Enteric tube courses into the stomach and off the film as tip is not visualized. Right IJ central venous catheter unchanged with tip over the SVC. - Persistent moderate size right pleural effusion and small left pleural effusion likely with associated bibasilar atelectasis. Infection in the lung bases is possible. Stable cardiomegaly. Remainder of the exam is unchanged. IMPRESSION: - Stable moderate right effusion and small left effusion likely with associated bibasilar atelectasis. Infection in the lung bases is possible. - Stable cardiomegaly. - Tubes and lines as described.  Liver US 08/17/19: IMPRESSION: No evidence of hepatic, portal or splenic venous thrombosis or occlusion is noted.  MRA head/neck 08/16/19: IMPRESSION: HEAD: 1. Markedly motion degraded examination. 2. No emergent large vessel occlusion or high-grade proximal stenosis. NECK: Severely motion limited study. The common carotid arteries and vertebral artery V2 segments are patent. Visualization of the internal carotid arteries is markedly limited. Consider repeat examination with contrast when the patient is better able to follow instructions.  CT L-spine 08/04/19: IMPRESSION: 1. Acute, comminuted L1 body fracture with mild noncompressive retropulsion. 2. Advanced lower lumbar degenerative disease with L4-5 anterolisthesis and moderate spinal/right foraminal stenosis. 3.  Partial coverage of small pleural effusions.    EKG: 10/06/19 ([facility]): Sinus tachycardia at 107 bpm.  ST and T wave abnormality, consider inferiolateral ischemia.    CV: Cardiac cath 09/02/19: 1. Optimized filling pressures.  2. Mild pulmonary hypertension.  3. Preserved cardiac output.  4. Extensive CAD, primarily distal/branch vessel disease. The only interventional target would be 80% proximal RCA stenosis.   I suspect that she primarily has a nonischemic cardiomyopathy, likely from ETOH abuse. However, extensive distal/branch vessel coronary disease may contribute. Once she has recovered from the current episode and no further bleeding, could consider RCA PCI if she has exertional symptoms despite medical therapy.     Echo 08/31/19: IMPRESSIONS 1. Left ventricular ejection fraction, by visual estimation, is 35 to 40%. The left ventricle has moderately decreased function. Normal left ventricular size. Left ventricular septal wall thickness was moderately increased. Moderately increased left  ventricular posterior wall thickness. There is moderately increased left ventricular hypertrophy. 2. Large, fixed thrombus on the apical wall of the left ventricle. 3. Left ventricular diastolic Doppler parameters are consistent with pseudonormalization pattern of LV diastolic filling. 4. LV thrombus slightly smaller than previously noted 07/2019. Systolic function has improved. 5. Global right ventricle has normal systolic function.The right ventricular size is normal. No increase in right ventricular wall thickness. 6. Left atrial size was mildly dilated. 7. Right atrial size was normal. 8. The mitral valve is normal in structure. Trace mitral valve regurgitation. No evidence of mitral stenosis. 9. The tricuspid valve is normal in structure. Tricuspid valve regurgitation is trivial. 10. The aortic valve is tricuspid Aortic  valve regurgitation was not visualized by color  flow Doppler. Structurally normal aortic valve, with no evidence of sclerosis or stenosis. 11. Aortic valve leaflets mildly thickened and calcified. 12. The pulmonic valve was normal in structure. Pulmonic valve regurgitation is not visualized by color flow Doppler. 13. Normal pulmonary artery systolic pressure. 14. The inferior vena cava is normal in size with greater than 50% respiratory variability, suggesting right atrial pressure of 3 mmHg. (Comparison 08/16/19: LVEF 15-20%, diffuse hypokinesis, large fixed thrombus on the apical septal wall of the LV measuring 3.1 x 1.4 cm, average left ventricular global longitudinal strain is -4.0 %mild-moderate MR, possibly functionally bicuspid AV with trivial AR, no AS, global RV has severely reduced systolic function.)    Carotid US 08/17/19: Summary: Right Carotid: Velocities in the right ICA are consistent with a 1-39% stenosis. Left Carotid: Velocities in the left ICA are consistent with a 1-39% stenosis. Vertebrals: Bilateral vertebral arteries demonstrate antegrade flow. Subclavians: Normal flow hemodynamics were seen in bilateral subclavian arteries.   Past Medical History:  Diagnosis Date  . Alcohol abuse   . CHF (congestive heart failure) (HCC)    last EF 35-40%. etoh induced  . Coronary artery disease    80%RCA, 50%LCX, 40%LAD  . DVT (deep venous thrombosis) (HCC)    right popliteal vein (08/17/19)  . GERD (gastroesophageal reflux disease)   . LV (left ventricular) mural thrombus   . Stroke (HCC)   . Tobacco abuse     Past Surgical History:  Procedure Laterality Date  . ABDOMINAL AORTOGRAM W/LOWER EXTREMITY N/A 08/19/2019   Procedure: ABDOMINAL AORTOGRAM W/LOWER EXTREMITY;  Surgeon: Nada Libman, MD;  [facility];  Service: Cardiovascular;  Laterality: N/A;  . ABDOMINAL AORTOGRAM W/LOWER EXTREMITY Bilateral 08/20/2019   Procedure: ABDOMINAL AORTOGRAM W/LOWER EXTREMITY;  Surgeon: Cephus Shelling, MD;  [facility];  Service: Cardiovascular;  Laterality: Bilateral;  . ESOPHAGOGASTRODUODENOSCOPY N/A 08/26/2019   Procedure: ESOPHAGOGASTRODUODENOSCOPY (EGD);  Surgeon: Charlott Rakes, MD;  Location: [facility] ENDOSCOPY;  Service: Endoscopy;  Laterality: N/A;  . HOT HEMOSTASIS N/A 08/26/2019   Procedure: HOT HEMOSTASIS (ARGON PLASMA COAGULATION/BICAP);  Surgeon: Charlott Rakes, MD;  Location: [facility] ENDOSCOPY;  Service: Endoscopy;  Laterality: N/A;  . PERIPHERAL VASCULAR BALLOON ANGIOPLASTY  08/19/2019   Procedure: PERIPHERAL VASCULAR BALLOON ANGIOPLASTY;  Surgeon: Nada Libman, MD;  [facility];  Service: Cardiovascular;;  Right femoral popliteal and PT   . PERIPHERAL VASCULAR INTERVENTION Left 08/20/2019   Procedure: PERIPHERAL VASCULAR INTERVENTION;  Surgeon: Cephus Shelling, MD;  Location: [facility] INVASIVE CV LAB;  Service: Cardiovascular;  Laterality: Left;  . RIGHT/LEFT HEART CATH AND CORONARY ANGIOGRAPHY N/A 09/02/2019   Procedure: RIGHT/LEFT HEART CATH AND CORONARY ANGIOGRAPHY;  Surgeon: Laurey Morale, MD;  Location: [facility] INVASIVE CV LAB;  Service: Cardiovascular;  Laterality: N/A;  . SCLEROTHERAPY  08/26/2019   Procedure: SCLEROTHERAPY;  Surgeon: Charlott Rakes, MD;  Location: [facility] ENDOSCOPY;  Service: Endoscopy;;    MEDICATIONS: No current facility-administered medications for this encounter.    . digoxin (LANOXIN) 0.125 MG tablet  . folic acid (FOLVITE) 1 MG tablet  . furosemide (LASIX) 20 MG tablet  . ibuprofen (ADVIL) 200 MG tablet  . metoprolol succinate (TOPROL-XL) 25 MG 24 hr tablet  . pantoprazole (PROTONIX) 40 MG tablet  . rosuvastatin (CRESTOR) 5 MG tablet  . warfarin (COUMADIN) 2.5 MG tablet    Shonna Chock, PA-C Surgical Short Stay/Anesthesiology [facility] Phone [phone] [facility] Phone [phone] 10/14/2019 12:40  PM       

## 2019-10-14 NOTE — Patient Instructions (Signed)
Hold coumadin tonight for surgery.  Hold lasix in the AM.  Follow up in 6 weeks.

## 2019-10-15 ENCOUNTER — Encounter (HOSPITAL_COMMUNITY): Payer: Self-pay

## 2019-10-15 ENCOUNTER — Ambulatory Visit (HOSPITAL_COMMUNITY): Payer: 59 | Admitting: Vascular Surgery

## 2019-10-15 ENCOUNTER — Observation Stay (HOSPITAL_COMMUNITY)
Admission: RE | Admit: 2019-10-15 | Discharge: 2019-10-16 | Disposition: A | Discharge status: Home / Self Care | Payer: 59 | Attending: Orthopedic Surgery | Admitting: Orthopedic Surgery

## 2019-10-15 ENCOUNTER — Encounter (HOSPITAL_COMMUNITY)
Admission: RE | Disposition: A | Discharge status: Home / Self Care | Payer: Self-pay | Source: Home / Self Care | Attending: Orthopedic Surgery

## 2019-10-15 ENCOUNTER — Other Ambulatory Visit: Payer: Self-pay

## 2019-10-15 DIAGNOSIS — E785 Hyperlipidemia, unspecified: Secondary | ICD-10-CM | POA: Insufficient documentation

## 2019-10-15 DIAGNOSIS — Z7901 Long term (current) use of anticoagulants: Secondary | ICD-10-CM | POA: Diagnosis not present

## 2019-10-15 DIAGNOSIS — Z87891 Personal history of nicotine dependence: Secondary | ICD-10-CM | POA: Insufficient documentation

## 2019-10-15 DIAGNOSIS — I2510 Atherosclerotic heart disease of native coronary artery without angina pectoris: Secondary | ICD-10-CM | POA: Diagnosis not present

## 2019-10-15 DIAGNOSIS — Z86718 Personal history of other venous thrombosis and embolism: Secondary | ICD-10-CM | POA: Insufficient documentation

## 2019-10-15 DIAGNOSIS — Z8673 Personal history of transient ischemic attack (TIA), and cerebral infarction without residual deficits: Secondary | ICD-10-CM | POA: Diagnosis not present

## 2019-10-15 DIAGNOSIS — L02611 Cutaneous abscess of right foot: Secondary | ICD-10-CM

## 2019-10-15 DIAGNOSIS — I5022 Chronic systolic (congestive) heart failure: Secondary | ICD-10-CM | POA: Insufficient documentation

## 2019-10-15 DIAGNOSIS — K219 Gastro-esophageal reflux disease without esophagitis: Secondary | ICD-10-CM | POA: Diagnosis not present

## 2019-10-15 DIAGNOSIS — Z79899 Other long term (current) drug therapy: Secondary | ICD-10-CM | POA: Diagnosis not present

## 2019-10-15 DIAGNOSIS — I96 Gangrene, not elsewhere classified: Secondary | ICD-10-CM | POA: Diagnosis present

## 2019-10-15 HISTORY — DX: Hyperlipidemia, unspecified: E78.5

## 2019-10-15 HISTORY — DX: Depression, unspecified: F32.A

## 2019-10-15 HISTORY — PX: DEBRIDEMENT  FOOT: SUR387

## 2019-10-15 HISTORY — PX: I & D EXTREMITY: SHX5045

## 2019-10-15 LAB — CBC
HCT: 32.8 % — ABNORMAL LOW (ref 36.0–46.0)
Hemoglobin: 10.6 g/dL — ABNORMAL LOW (ref 12.0–15.0)
MCH: 31.4 pg (ref 26.0–34.0)
MCHC: 32.3 g/dL (ref 30.0–36.0)
MCV: 97 fL (ref 80.0–100.0)
Platelets: 535 10*3/uL — ABNORMAL HIGH (ref 150–400)
RBC: 3.38 MIL/uL — ABNORMAL LOW (ref 3.87–5.11)
RDW: 15.3 % (ref 11.5–15.5)
WBC: 7.4 10*3/uL (ref 4.0–10.5)
nRBC: 0 % (ref 0.0–0.2)

## 2019-10-15 LAB — COMPREHENSIVE METABOLIC PANEL
ALT: 10 U/L (ref 0–44)
AST: 26 U/L (ref 15–41)
Albumin: 2.6 g/dL — ABNORMAL LOW (ref 3.5–5.0)
Alkaline Phosphatase: 99 U/L (ref 38–126)
Anion gap: 11 (ref 5–15)
BUN: 8 mg/dL (ref 6–20)
CO2: 26 mmol/L (ref 22–32)
Calcium: 9.2 mg/dL (ref 8.9–10.3)
Chloride: 95 mmol/L — ABNORMAL LOW (ref 98–111)
Creatinine, Ser: 0.97 mg/dL (ref 0.44–1.00)
GFR calc Af Amer: >60 mL/min (ref >60)
GFR calc non Af Amer: >60 mL/min (ref >60)
Glucose, Bld: 94 mg/dL (ref 70–99)
Potassium: 4.2 mmol/L (ref 3.5–5.1)
Sodium: 132 mmol/L — ABNORMAL LOW (ref 135–145)
Total Bilirubin: 1.3 mg/dL — ABNORMAL HIGH (ref 0.3–1.2)
Total Protein: 8 g/dL (ref 6.5–8.1)

## 2019-10-15 LAB — PROTIME-INR
INR: 1.9 — ABNORMAL HIGH (ref 0.8–1.2)
Prothrombin Time: 22.1 seconds — ABNORMAL HIGH (ref 11.4–15.2)

## 2019-10-15 SURGERY — IRRIGATION AND DEBRIDEMENT EXTREMITY
Anesthesia: General | Site: Foot | Laterality: Right

## 2019-10-15 MED ORDER — ONDANSETRON HCL 4 MG/2ML IJ SOLN
INTRAMUSCULAR | Status: DC | PRN
  Administered 2019-10-15: 4 mg via INTRAVENOUS

## 2019-10-15 MED ORDER — CHLORHEXIDINE GLUCONATE 4 % EX LIQD: 60.0000 mL | Freq: Once | CUTANEOUS | Status: DC

## 2019-10-15 MED ORDER — ONDANSETRON HCL 4 MG/2ML IJ SOLN
INTRAMUSCULAR | Status: AC
  Filled 2019-10-15: qty 2

## 2019-10-15 MED ORDER — VANCOMYCIN HCL IN DEXTROSE 1-5 GM/200ML-% IV SOLN
1000.0000 mg | Freq: Once | INTRAVENOUS | Status: AC
  Administered 2019-10-15: 1000 mg via INTRAVENOUS
  Filled 2019-10-15: qty 200

## 2019-10-15 MED ORDER — METOPROLOL SUCCINATE ER 25 MG PO TB24
25.0000 mg | ORAL_TABLET | Freq: Every evening | ORAL | Status: DC
  Administered 2019-10-15: 25 mg via ORAL
  Filled 2019-10-15: qty 1

## 2019-10-15 MED ORDER — PANTOPRAZOLE SODIUM 40 MG PO TBEC
40.0000 mg | DELAYED_RELEASE_TABLET | Freq: Every evening | ORAL | Status: DC
  Administered 2019-10-15: 18:00:00 40 mg via ORAL
  Filled 2019-10-15: qty 1

## 2019-10-15 MED ORDER — PHENYLEPHRINE 40 MCG/ML (10ML) SYRINGE FOR IV PUSH (FOR BLOOD PRESSURE SUPPORT)
PREFILLED_SYRINGE | INTRAVENOUS | Status: AC
  Filled 2019-10-15: qty 10

## 2019-10-15 MED ORDER — DIGOXIN 125 MCG PO TABS
0.1250 mg | ORAL_TABLET | ORAL | Status: DC
  Administered 2019-10-16: 0.125 mg via ORAL
  Filled 2019-10-15: qty 1

## 2019-10-15 MED ORDER — FOLIC ACID 1 MG PO TABS
1.0000 mg | ORAL_TABLET | Freq: Every evening | ORAL | Status: DC
  Administered 2019-10-15: 1 mg via ORAL
  Filled 2019-10-15: qty 1

## 2019-10-15 MED ORDER — METOCLOPRAMIDE HCL 5 MG PO TABS: 5.0000 mg | ORAL_TABLET | Freq: Three times a day (TID) | ORAL | Status: DC | PRN

## 2019-10-15 MED ORDER — ACETAMINOPHEN 325 MG PO TABS
325.0000 mg | ORAL_TABLET | ORAL | Status: DC | PRN
  Administered 2019-10-15: 650 mg via ORAL
  Administered 2019-10-15: 325 mg via ORAL

## 2019-10-15 MED ORDER — WARFARIN - PHYSICIAN DOSING INPATIENT: Freq: Every day | Status: DC

## 2019-10-15 MED ORDER — CEFAZOLIN SODIUM-DEXTROSE 1-4 GM/50ML-% IV SOLN
1.0000 g | Freq: Four times a day (QID) | INTRAVENOUS | Status: DC
  Filled 2019-10-15: qty 50

## 2019-10-15 MED ORDER — ACETAMINOPHEN 160 MG/5ML PO SOLN: 325.0000 mg | ORAL | Status: DC | PRN

## 2019-10-15 MED ORDER — FENTANYL CITRATE (PF) 100 MCG/2ML IJ SOLN
INTRAMUSCULAR | Status: AC
  Filled 2019-10-15: qty 2

## 2019-10-15 MED ORDER — WARFARIN SODIUM 2.5 MG PO TABS
2.5000 mg | ORAL_TABLET | Freq: Every day | ORAL | Status: DC
  Administered 2019-10-15: 2.5 mg via ORAL
  Filled 2019-10-15 (×2): qty 1

## 2019-10-15 MED ORDER — HYDROMORPHONE HCL 1 MG/ML IJ SOLN: 0.5000 mg | INTRAMUSCULAR | Status: DC | PRN

## 2019-10-15 MED ORDER — FUROSEMIDE 20 MG PO TABS
20.0000 mg | ORAL_TABLET | Freq: Every evening | ORAL | Status: DC
  Administered 2019-10-15: 20 mg via ORAL
  Filled 2019-10-15: qty 1

## 2019-10-15 MED ORDER — SODIUM CHLORIDE 0.9 % IV SOLN
INTRAVENOUS | Status: DC
  Administered 2019-10-15: 18:00:00 via INTRAVENOUS

## 2019-10-15 MED ORDER — OXYCODONE HCL 5 MG/5ML PO SOLN: 5.0000 mg | Freq: Once | ORAL | Status: DC | PRN

## 2019-10-15 MED ORDER — MIDAZOLAM HCL 2 MG/2ML IJ SOLN
INTRAMUSCULAR | Status: DC | PRN
  Administered 2019-10-15: 2 mg via INTRAVENOUS

## 2019-10-15 MED ORDER — ONDANSETRON HCL 4 MG/2ML IJ SOLN: 4.0000 mg | Freq: Once | INTRAMUSCULAR | Status: DC | PRN

## 2019-10-15 MED ORDER — LIDOCAINE 2% (20 MG/ML) 5 ML SYRINGE
INTRAMUSCULAR | Status: AC
  Filled 2019-10-15: qty 5

## 2019-10-15 MED ORDER — METOCLOPRAMIDE HCL 5 MG/ML IJ SOLN: 5.0000 mg | Freq: Three times a day (TID) | INTRAMUSCULAR | Status: DC | PRN

## 2019-10-15 MED ORDER — CEFAZOLIN SODIUM-DEXTROSE 2-4 GM/100ML-% IV SOLN
2.0000 g | INTRAVENOUS | Status: AC
  Administered 2019-10-15: 2 g via INTRAVENOUS
  Filled 2019-10-15: qty 100

## 2019-10-15 MED ORDER — OXYCODONE HCL 5 MG PO TABS
ORAL_TABLET | ORAL | Status: AC
  Filled 2019-10-15: qty 2

## 2019-10-15 MED ORDER — LIDOCAINE HCL (CARDIAC) PF 100 MG/5ML IV SOSY
PREFILLED_SYRINGE | INTRAVENOUS | Status: DC | PRN
  Administered 2019-10-15: 60 mg via INTRATRACHEAL

## 2019-10-15 MED ORDER — FENTANYL CITRATE (PF) 250 MCG/5ML IJ SOLN
INTRAMUSCULAR | Status: DC | PRN
  Administered 2019-10-15 (×2): 25 ug via INTRAVENOUS

## 2019-10-15 MED ORDER — MEPERIDINE HCL 25 MG/ML IJ SOLN: 6.2500 mg | INTRAMUSCULAR | Status: DC | PRN

## 2019-10-15 MED ORDER — DOCUSATE SODIUM 100 MG PO CAPS
100.0000 mg | ORAL_CAPSULE | Freq: Two times a day (BID) | ORAL | Status: DC
  Administered 2019-10-15 – 2019-10-16 (×2): 100 mg via ORAL
  Filled 2019-10-15 (×2): qty 1

## 2019-10-15 MED ORDER — ACETAMINOPHEN 325 MG PO TABS
ORAL_TABLET | ORAL | Status: AC
  Filled 2019-10-15: qty 2

## 2019-10-15 MED ORDER — ONDANSETRON HCL 4 MG/2ML IJ SOLN: 4.0000 mg | Freq: Four times a day (QID) | INTRAMUSCULAR | Status: DC | PRN

## 2019-10-15 MED ORDER — FENTANYL CITRATE (PF) 100 MCG/2ML IJ SOLN
25.0000 ug | INTRAMUSCULAR | Status: DC | PRN
  Administered 2019-10-15 (×2): 25 ug via INTRAVENOUS
  Administered 2019-10-15: 50 ug via INTRAVENOUS

## 2019-10-15 MED ORDER — ROSUVASTATIN CALCIUM 5 MG PO TABS
5.0000 mg | ORAL_TABLET | Freq: Every evening | ORAL | Status: DC
  Administered 2019-10-15: 5 mg via ORAL
  Filled 2019-10-15: qty 1

## 2019-10-15 MED ORDER — DEXAMETHASONE SODIUM PHOSPHATE 10 MG/ML IJ SOLN
INTRAMUSCULAR | Status: AC
  Filled 2019-10-15: qty 1

## 2019-10-15 MED ORDER — ONDANSETRON HCL 4 MG PO TABS: 4.0000 mg | ORAL_TABLET | Freq: Four times a day (QID) | ORAL | Status: DC | PRN

## 2019-10-15 MED ORDER — OXYCODONE HCL 5 MG PO TABS: 5.0000 mg | ORAL_TABLET | Freq: Once | ORAL | Status: DC | PRN

## 2019-10-15 MED ORDER — OXYCODONE HCL 5 MG PO TABS
5.0000 mg | ORAL_TABLET | ORAL | Status: DC | PRN
  Administered 2019-10-15: 10 mg via ORAL
  Administered 2019-10-15 – 2019-10-16 (×2): 5 mg via ORAL
  Filled 2019-10-15: qty 2
  Filled 2019-10-15: qty 1

## 2019-10-15 MED ORDER — 0.9 % SODIUM CHLORIDE (POUR BTL) OPTIME
TOPICAL | Status: DC | PRN
  Administered 2019-10-15 (×2): 1000 mL

## 2019-10-15 MED ORDER — CEFAZOLIN SODIUM-DEXTROSE 2-4 GM/100ML-% IV SOLN: 2.0000 g | INTRAVENOUS | Status: DC

## 2019-10-15 MED ORDER — DIPHENHYDRAMINE HCL 50 MG/ML IJ SOLN
12.5000 mg | Freq: Once | INTRAMUSCULAR | Status: AC
  Administered 2019-10-15: 12.5 mg via INTRAVENOUS

## 2019-10-15 MED ORDER — PHENYLEPHRINE HCL (PRESSORS) 10 MG/ML IV SOLN
INTRAVENOUS | Status: DC | PRN
  Administered 2019-10-15: 120 ug via INTRAVENOUS
  Administered 2019-10-15 (×2): 80 ug via INTRAVENOUS

## 2019-10-15 MED ORDER — DEXAMETHASONE SODIUM PHOSPHATE 10 MG/ML IJ SOLN
INTRAMUSCULAR | Status: DC | PRN
  Administered 2019-10-15: 5 mg via INTRAVENOUS

## 2019-10-15 MED ORDER — DIPHENHYDRAMINE HCL 50 MG/ML IJ SOLN
INTRAMUSCULAR | Status: AC
  Filled 2019-10-15: qty 1

## 2019-10-15 MED ORDER — PROPOFOL 10 MG/ML IV BOLUS
INTRAVENOUS | Status: DC | PRN
  Administered 2019-10-15: 60 mg via INTRAVENOUS
  Administered 2019-10-15: 50 mg via INTRAVENOUS

## 2019-10-15 MED ORDER — MIDAZOLAM HCL 2 MG/2ML IJ SOLN
INTRAMUSCULAR | Status: AC
  Filled 2019-10-15: qty 2

## 2019-10-15 MED ORDER — LACTATED RINGERS IV SOLN
INTRAVENOUS | Status: DC
  Administered 2019-10-15: 09:00:00 via INTRAVENOUS

## 2019-10-15 MED ORDER — FENTANYL CITRATE (PF) 250 MCG/5ML IJ SOLN
INTRAMUSCULAR | Status: AC
  Filled 2019-10-15: qty 5

## 2019-10-15 SURGICAL SUPPLY — 38 items
ALLOGRAFT SKIN MESHD 125 SQ CM (Graft) ×1 IMPLANT
BLADE SURG 21 STRL SS (BLADE) ×2 IMPLANT
BNDG COHESIVE 3X5 TAN STRL LF (GAUZE/BANDAGES/DRESSINGS) ×2 IMPLANT
BNDG COHESIVE 6X5 TAN STRL LF (GAUZE/BANDAGES/DRESSINGS) IMPLANT
BNDG GAUZE ELAST 4 BULKY (GAUZE/BANDAGES/DRESSINGS) ×4 IMPLANT
CANISTER WOUNDNEG PRESSURE 500 (CANNISTER) ×2 IMPLANT
COVER SURGICAL LIGHT HANDLE (MISCELLANEOUS) ×4 IMPLANT
COVER WAND RF STERILE (DRAPES) ×2 IMPLANT
DRAPE U-SHAPE 47X51 STRL (DRAPES) ×2 IMPLANT
DRESSING VERAFLO CLEANSE CC (GAUZE/BANDAGES/DRESSINGS) ×1 IMPLANT
DRSG ADAPTIC 3X8 NADH LF (GAUZE/BANDAGES/DRESSINGS) ×2 IMPLANT
DRSG VERAFLO CLEANSE CC (GAUZE/BANDAGES/DRESSINGS) ×2
DURAPREP 26ML APPLICATOR (WOUND CARE) ×2 IMPLANT
ELECT REM PT RETURN 9FT ADLT (ELECTROSURGICAL)
ELECTRODE REM PT RTRN 9FT ADLT (ELECTROSURGICAL) IMPLANT
GAUZE SPONGE 4X4 12PLY STRL (GAUZE/BANDAGES/DRESSINGS) ×2 IMPLANT
GLOVE BIOGEL PI IND STRL 9 (GLOVE) ×1 IMPLANT
GLOVE BIOGEL PI INDICATOR 9 (GLOVE) ×1
GLOVE SURG ORTHO 9.0 STRL STRW (GLOVE) ×2 IMPLANT
GOWN STRL REUS W/ TWL XL LVL3 (GOWN DISPOSABLE) ×2 IMPLANT
GOWN STRL REUS W/TWL XL LVL3 (GOWN DISPOSABLE) ×2
HANDPIECE INTERPULSE COAX TIP (DISPOSABLE)
KIT BASIN OR (CUSTOM PROCEDURE TRAY) ×2 IMPLANT
KIT TURNOVER KIT B (KITS) ×2 IMPLANT
MANIFOLD NEPTUNE II (INSTRUMENTS) ×2 IMPLANT
MICROMATRIX 1000MG (Tissue) ×2 IMPLANT
NS IRRIG 1000ML POUR BTL (IV SOLUTION) ×2 IMPLANT
PACK ORTHO EXTREMITY (CUSTOM PROCEDURE TRAY) ×2 IMPLANT
PAD ARMBOARD 7.5X6 YLW CONV (MISCELLANEOUS) ×4 IMPLANT
SET HNDPC FAN SPRY TIP SCT (DISPOSABLE) IMPLANT
SKIN MESHED 125 SQ CM (Graft) ×2 IMPLANT
SOLUTION PARTIC MCRMTRX 1000MG (Tissue) ×1 IMPLANT
STOCKINETTE IMPERVIOUS 9X36 MD (GAUZE/BANDAGES/DRESSINGS) IMPLANT
SWAB COLLECTION DEVICE MRSA (MISCELLANEOUS) ×2 IMPLANT
SWAB CULTURE ESWAB REG 1ML (MISCELLANEOUS) IMPLANT
TOWEL GREEN STERILE (TOWEL DISPOSABLE) ×2 IMPLANT
TUBE CONNECTING 12X1/4 (SUCTIONS) ×2 IMPLANT
YANKAUER SUCT BULB TIP NO VENT (SUCTIONS) ×2 IMPLANT

## 2019-10-15 NOTE — Anesthesia Procedure Notes (Signed)
Procedure Name: LMA Insertion Date/Time: 10/15/2019 9:45 AM Performed by: Kathryne Hitch, CRNA Pre-anesthesia Checklist: Patient identified, Emergency Drugs available, Suction available and Patient being monitored Patient Re-evaluated:Patient Re-evaluated prior to induction Oxygen Delivery Method: Circle system utilized Preoxygenation: Pre-oxygenation with 100% oxygen Induction Type: IV induction Ventilation: Mask ventilation without difficulty LMA: LMA inserted LMA Size: 4.0 Placement Confirmation: positive ETCO2 and breath sounds checked- equal and bilateral Tube secured with: Tape Dental Injury: Teeth and Oropharynx as per pre-operative assessment

## 2019-10-15 NOTE — Progress Notes (Signed)
Pt stated she developed itchiness upon getting Cefazolin earlier today. Pt allergic to Penicillin. MD notified, received callback from Dr. Marlou Sa to switch to Vancomycin, pharmacy called for dosing.

## 2019-10-15 NOTE — Anesthesia Postprocedure Evaluation (Signed)
Anesthesia Post Note  Patient: Wilna Pennie  Procedure(s) Performed: RIGHT FOOT DEBRIDEMENT (Right Foot)     Patient location during evaluation: PACU Anesthesia Type: General Level of consciousness: awake and alert Pain management: pain level controlled Vital Signs Assessment: post-procedure vital signs reviewed and stable Respiratory status: spontaneous breathing, nonlabored ventilation, respiratory function stable and patient connected to nasal cannula oxygen Cardiovascular status: blood pressure returned to baseline and stable Postop Assessment: no apparent nausea or vomiting Anesthetic complications: no    Last Vitals:  Vitals:   10/15/19 1535 10/15/19 1605  BP: (!) 97/55 108/69  Pulse: 83 82  Resp: 18 (!) 28  Temp:    SpO2: 100% 98%    Last Pain:  Vitals:   10/15/19 1435  TempSrc:   PainSc: 6                  Tarique Loveall

## 2019-10-15 NOTE — Plan of Care (Signed)

## 2019-10-15 NOTE — Transfer of Care (Signed)
Immediate Anesthesia Transfer of Care Note  Patient: Briana Andrews  Procedure(s) Performed: RIGHT FOOT DEBRIDEMENT (Right Foot)  Patient Location: PACU  Anesthesia Type:General  Level of Consciousness: drowsy and patient cooperative  Airway & Oxygen Therapy: Patient Spontanous Breathing and Patient connected to face mask oxygen  Post-op Assessment: Report given to RN and Post -op Vital signs reviewed and stable  Post vital signs: Reviewed and stable  Last Vitals:  Vitals Value Taken Time  BP 122/67 10/15/19 1005  Temp    Pulse 73 10/15/19 1005  Resp 17 10/15/19 1005  SpO2 100 % 10/15/19 1005  Vitals shown include unvalidated device data.  Last Pain:  Vitals:   10/15/19 0823  TempSrc:   PainSc: 0-No pain      Patients Stated Pain Goal: 3 (76/72/09 4709)  Complications: No apparent anesthesia complications

## 2019-10-15 NOTE — H&P (Signed)
Briana Andrews is an 53 y.o. female.   Chief Complaint: Gangrene Right Foot HPI:  HPI: Patient is a 53 year old woman who was seen for a large necrotic wound on the dorsal aspect of the right foot she states she has some drainage and odor.  Patient states that her pain is 2-3 over 10.  Patient states that this ulcer started in September.  Patient states that she had multiple medical issues she had a stroke she had renal failure with multisystem failure.  Patient does not know if she fell on her foot had blunt trauma or if her foot was injected.  Patient states she is recently stopped smoking and stop drinking.  Past Medical History:  Diagnosis Date  . Alcohol abuse   . CHF (congestive heart failure) (HCC)    last EF 35-40%. etoh induced  . Coronary artery disease    80%RCA, 50%LCX, 40%LAD  . Depression   . DVT (deep venous thrombosis) (HCC)    right popliteal vein (08/17/19)  . GERD (gastroesophageal reflux disease)   . Hyperlipidemia   . LV (left ventricular) mural thrombus   . Stroke [facility])    / memory loss  . Tobacco abuse     Past Surgical History:  Procedure Laterality Date  . ABDOMINAL AORTOGRAM W/LOWER EXTREMITY N/A 08/19/2019   Procedure: ABDOMINAL AORTOGRAM W/LOWER EXTREMITY;  Surgeon: Nada Libman, MD;  [facility];  Service: Cardiovascular;  Laterality: N/A;  . ABDOMINAL AORTOGRAM W/LOWER EXTREMITY Bilateral 08/20/2019   Procedure: ABDOMINAL AORTOGRAM W/LOWER EXTREMITY;  Surgeon: Cephus Shelling, MD;  [facility];  Service: Cardiovascular;  Laterality: Bilateral;  . ESOPHAGOGASTRODUODENOSCOPY N/A 08/26/2019   Procedure: ESOPHAGOGASTRODUODENOSCOPY (EGD);  Surgeon: Charlott Rakes, MD;  Location: [facility] ENDOSCOPY;  Service: Endoscopy;  Laterality: N/A;  . HOT HEMOSTASIS N/A 08/26/2019   Procedure: HOT HEMOSTASIS (ARGON PLASMA COAGULATION/BICAP);  Surgeon: Charlott Rakes, MD;  Location: [facility] ENDOSCOPY;  Service: Endoscopy;  Laterality:  N/A;  . PERIPHERAL VASCULAR BALLOON ANGIOPLASTY  08/19/2019   Procedure: PERIPHERAL VASCULAR BALLOON ANGIOPLASTY;  Surgeon: Nada Libman, MD;  [facility];  Service: Cardiovascular;;  Right femoral popliteal and PT   . PERIPHERAL VASCULAR INTERVENTION Left 08/20/2019   Procedure: PERIPHERAL VASCULAR INTERVENTION;  Surgeon: Cephus Shelling, MD;  Location: [facility] INVASIVE CV LAB;  Service: Cardiovascular;  Laterality: Left;  . RIGHT/LEFT HEART CATH AND CORONARY ANGIOGRAPHY N/A 09/02/2019   Procedure: RIGHT/LEFT HEART CATH AND CORONARY ANGIOGRAPHY;  Surgeon: Laurey Morale, MD;  Location: [facility] INVASIVE CV LAB;  Service: Cardiovascular;  Laterality: N/A;  . SCLEROTHERAPY  08/26/2019   Procedure: SCLEROTHERAPY;  Surgeon: Charlott Rakes, MD;  Location: [facility] ENDOSCOPY;  Service: Endoscopy;;    Family History  Problem Relation Age of Onset  . Heart disease Mother    Social History:  reports that she quit smoking about 3 months ago. She quit after 39.00 years of use. She has never used smokeless tobacco. She reports previous alcohol use. She reports previous drug use. Drug: Marijuana.  Allergies:  Allergies  Allergen Reactions  . Penicillins Other (See Comments)    Unsure of reaction Did it involve swelling of the face/tongue/throat, SOB, or low BP? Unknown Did it involve sudden or severe rash/hives, skin peeling, or any reaction on the inside of your mouth or nose? Unknown Did you need to seek medical attention at a hospital or doctor's office? Unknown When did it last happen?[place] If all above answers are "NO", may proceed with cephalosporin  use.    Medications Prior to Admission  Medication Sig Dispense Refill  . digoxin (LANOXIN) 0.125 MG tablet Take 1 tablet (0.125 mg total) by mouth every other day. (Patient taking differently: Take 0.125 mg by mouth every other day. In the evening) 30 tablet 5  . folic acid (FOLVITE) 1 MG tablet Take 1 tablet (1 mg total) by  mouth daily. (Patient taking differently: Take 1 mg by mouth every evening. ) 30 tablet 5  . furosemide (LASIX) 20 MG tablet Take 1 tablet (20 mg total) by mouth daily. (Patient taking differently: Take 20 mg by mouth every evening. ) 30 tablet 6  . ibuprofen (ADVIL) 200 MG tablet Take 200 mg by mouth every 6 (six) hours as needed for headache or moderate pain.    . metoprolol succinate (TOPROL-XL) 25 MG 24 hr tablet Take 1 tablet (25 mg total) by mouth daily. (Patient taking differently: Take 25 mg by mouth every evening. ) 30 tablet 6  . pantoprazole (PROTONIX) 40 MG tablet Take 1 tablet (40 mg total) by mouth daily. (Patient taking differently: Take 40 mg by mouth every evening. ) 30 tablet 5  . rosuvastatin (CRESTOR) 5 MG tablet Take 1 tablet (5 mg total) by mouth daily at 6 PM. (Patient taking differently: Take 5 mg by mouth every evening. ) 90 tablet 3  . warfarin (COUMADIN) 2.5 MG tablet Take 1 tablet (2.5 mg total) by mouth daily at 6 PM. (Patient taking differently: Take 2.5 mg by mouth daily at 6 PM. Take 3.75 on Monday and Friday Take 2.5 mg all the the other days) 30 tablet 5    Results for orders placed or performed during the hospital encounter of 10/14/19 (from the past 48 hour(s))  SARS CORONAVIRUS 2 (TAT 6-24 HRS) Nasopharyngeal Nasopharyngeal Swab     Status: None   Collection Time: 10/14/19 10:45 AM   Specimen: Nasopharyngeal Swab  Result Value Ref Range   SARS Coronavirus 2 NEGATIVE NEGATIVE    Comment: (NOTE) SARS-CoV-2 target nucleic acids are NOT DETECTED. The SARS-CoV-2 RNA is generally detectable in upper and lower respiratory specimens during the acute phase of infection. Negative results do not preclude SARS-CoV-2 infection, do not rule out co-infections with other pathogens, and should not be used as the sole basis for treatment or other patient management decisions. Negative results must be combined with clinical observations, patient history, and epidemiological  information. The expected result is Negative. Fact Sheet for Patients: HairSlick.no Fact Sheet for Healthcare Providers: quierodirigir.com This test is not yet approved or cleared by the [place] FDA and  has been authorized for detection and/or diagnosis of SARS-CoV-2 by FDA under an Emergency Use Authorization (EUA). This EUA will remain  in effect (meaning this test can be used) for the duration of the COVID-19 declaration under Section 56 4(b)(1) of the Act, 21 U.S.C. section 360bbb-3(b)(1), unless the authorization is terminated or revoked sooner. Performed at [facility], [address] [place], [place] [address]    No results found.  Review of Systems  All other systems reviewed and are negative.   Blood pressure (!) 156/81, pulse 75, temperature 97.7 F (36.5 C), temperature source Oral, resp. rate 20, height 5\' 5"  (1.651 m), weight 62.6 kg, SpO2 100 %. Physical Exam  Patient is alert, oriented, no adenopathy, well-dressed, normal affect, normal respiratory effort. Examination patient has a black full-thickness eschar that covers the entire dorsum of her foot it is 15 cm x 10 cm.  Using the Doppler she has a triphasic dorsalis pedis and posterior tibial pulse.  There are no plantar ulcers. Assessment/Plan  1. Gangrene of right foot ([place])     Plan: Discussed with the patient the best treatment for foot salvage intervention would be surgical debridement placement of an installation wound VAC followed by repeat surgery with application of split-thickness skin graft and a cleanse choice wound VAC sponge.  Patient states she would like to proceed with surgery.  Bevely Palmer Donnell Wion, PA 10/15/2019, 7:22 AM

## 2019-10-15 NOTE — Op Note (Signed)
10/15/2019  10:06 AM  PATIENT:  Briana Andrews    PRE-OPERATIVE DIAGNOSIS:  Gangrene Right Foot  POST-OPERATIVE DIAGNOSIS:  Same  PROCEDURE:  RIGHT FOOT DEBRIDEMENT With excision skin soft tissue muscle fascia and tendon for an area of 001 cm Application of ACell powder 1 g and split-thickness skin graft 125 cm. Application of cleanse choice wound VAC.  SURGEON:  Newt Minion, MD  PHYSICIAN ASSISTANT:None ANESTHESIA:   General  PREOPERATIVE INDICATIONS:  Briana Andrews is a  53 y.o. female with a diagnosis of Gangrene Right Foot who failed conservative measures and elected for surgical management.    The risks benefits and alternatives were discussed with the patient preoperatively including but not limited to the risks of infection, bleeding, nerve injury, cardiopulmonary complications, the need for revision surgery, among others, and the patient was willing to proceed.  OPERATIVE IMPLANTS: ACell powder 1 g. Allograft split-thickness skin graft 125 cm  @ENCIMAGES @  OPERATIVE FINDINGS: Extensive necrosis on the dorsum of the foot that involves skin and soft tissue muscle fascia and extensor tendons.  OPERATIVE PROCEDURE: Patient was brought the operating room and underwent a general anesthetic.  After adequate levels anesthesia were obtained patient's right lower extremity was prepped using DuraPrep draped into a sterile field a timeout was called.  A 21 blade knife was used to debride the necrotic tissue off the dorsum of the foot this includes sharp excision of skin soft tissue muscle fascia and tendon.  This left a dorsal wound which was 150 cm.  This was irrigated with normal saline.  There was good petechial bleeding.  ACell powder was then applied to the wound bed and then this was covered with the allograft split-thickness skin graft for total coverage of 150 cm.  The cleanse choice wound VAC was applied this had a good suction fit this was overwrapped with Covan patient  was extubated taken the PACU in stable condition.   DISCHARGE PLANNING:  Antibiotic duration: Continue antibiotics for 24 hours  Weightbearing: Nonweightbearing on the right  Pain medication: Opioid pathway  Dressing care/ Wound VAC: Continue wound VAC for 1 week  Ambulatory devices: Walker  Discharge to: Observation with discharge to home  Follow-up: In the office 1 week post operative.

## 2019-10-16 ENCOUNTER — Encounter (HOSPITAL_COMMUNITY): Payer: Self-pay | Admitting: Orthopedic Surgery

## 2019-10-16 DIAGNOSIS — I96 Gangrene, not elsewhere classified: Secondary | ICD-10-CM | POA: Diagnosis not present

## 2019-10-16 MED ORDER — OXYCODONE HCL 5 MG PO TABS: 5.0000 mg | ORAL_TABLET | ORAL | 0 refills | Status: DC | PRN

## 2019-10-16 NOTE — Discharge Summary (Signed)
Discharge Diagnoses:  Active Problems:   Gangrene of right foot (HCC)   Cutaneous abscess of right foot   Surgeries: Procedure(s): RIGHT FOOT DEBRIDEMENT on 10/15/2019    Consultants:   Discharged Condition: Improved  Hospital Course: Briana Andrews is an 53 y.o. female who was admitted 10/15/2019 with a chief complaint of gangrene right foot, with a final diagnosis of Gangrene Right Foot.  Patient was brought to the operating room on 10/15/2019 and underwent Procedure(s): RIGHT FOOT DEBRIDEMENT.    Patient was given perioperative antibiotics:  Anti-infectives (From admission, onward)   Start     Dose/Rate Route Frequency Ordered Stop   10/15/19 1830  vancomycin (VANCOCIN) IVPB 1000 mg/200 mL premix     1,000 mg 200 mL/hr over 60 Minutes Intravenous  Once 10/15/19 1821 10/15/19 2124   10/15/19 1700  ceFAZolin (ANCEF) IVPB 1 g/50 mL premix  Status:  Discontinued     1 g 100 mL/hr over 30 Minutes Intravenous Every 6 hours 10/15/19 1640 10/15/19 1821   10/15/19 0800  ceFAZolin (ANCEF) IVPB 2g/100 mL premix     2 g 200 mL/hr over 30 Minutes Intravenous On call to O.R. 10/15/19 0754 10/15/19 1008   10/15/19 0800  ceFAZolin (ANCEF) IVPB 2g/100 mL premix  Status:  Discontinued     2 g 200 mL/hr over 30 Minutes Intravenous On call to O.R. 10/15/19 [id number] 10/15/19 0826    .  Patient was given sequential compression devices, early ambulation, and aspirin for DVT prophylaxis.  Recent vital signs:  Patient Vitals for the past 24 hrs:  BP Temp Temp src Pulse Resp SpO2 Height Weight  10/16/19 0311 (!) 143/100 (!) 97.5 F (36.4 C) Oral (!) 49 - 98 % - -  10/15/19 2352 (!) 146/54 97.8 F (36.6 C) Oral (!) 49 - 100 % - -  10/15/19 1944 116/60 98 F (36.7 C) Oral (!) 53 18 100 % - -  10/15/19 1705 121/69 98 F (36.7 C) Oral 78 16 100 % - -  10/15/19 1605 108/69 97.6 F (36.4 C) - 82 (!) 28 98 % - -  10/15/19 1535 (!) 97/55 - - 83 18 100 % - -  10/15/19 1505 116/80 - - 79 (!) 24 100 %  - -  10/15/19 1435 (!) 125/95 - - 71 (!) 25 100 % - -  10/15/19 1405 116/82 - - 74 20 100 % - -  10/15/19 1335 114/63 - - (!) 50 17 96 % - -  10/15/19 1320 126/70 - - 73 20 100 % - -  10/15/19 1315 - - - 65 20 100 % - -  10/15/19 1305 (!) 114/56 - - (!) 56 20 99 % - -  10/15/19 1300 - - - (!) 53 18 99 % - -  10/15/19 1250 114/85 - - 67 20 99 % - -  10/15/19 1245 (!) 80/58 - - 65 19 98 % - -  10/15/19 1243 (!) 67/55 - - 61 17 98 % - -  10/15/19 1240 108/70 - - 66 18 97 % - -  10/15/19 1237 (!) 118/58 - - (!) 59 18 97 % - -  10/15/19 1235 (!) 118/58 - - 71 20 96 % - -  10/15/19 1230 - - - 67 18 100 % - -  10/15/19 1215 - - - 78 (!) 24 100 % - -  10/15/19 1205 134/71 - - 72 11 100 % - -  10/15/19 1200 - - - 72 20  100 % - -  10/15/19 1150 124/81 - - 69 20 100 % - -  10/15/19 1145 - - - 79 19 100 % - -  10/15/19 1135 113/75 - - (!) 54 (!) 25 98 % - -  10/15/19 1130 - - - (!) 48 20 100 % - -  10/15/19 1120 126/76 - - 76 (!) 22 100 % - -  10/15/19 1115 - - - (!) 57 17 100 % - -  10/15/19 1105 (!) 129/57 - - 67 17 100 % - -  10/15/19 1100 - - - 70 18 100 % - -  10/15/19 1050 121/86 - - 69 (!) 21 99 % - -  10/15/19 1045 122/67 97.6 F (36.4 C) - 67 15 100 % - -  10/15/19 1035 116/73 - - 72 20 100 % - -  10/15/19 1030 - - - 73 19 100 % - -  10/15/19 1020 113/63 - - 75 17 100 % - -  10/15/19 1015 - - - 78 (!) 23 100 % - -  10/15/19 1005 122/67 97.6 F (36.4 C) - 73 17 100 % - -  10/15/19 0720 (!) 156/81 97.7 F (36.5 C) Oral 75 20 100 % 5\' 5"  (1.651 m) 62.6 kg  .  Recent laboratory studies: No results found.  Discharge Medications:     Diagnostic Studies: Ct Head Wo Contrast  Result Date: 09/21/2019 CLINICAL DATA:  Recent fall with headaches, initial encounter EXAM: CT HEAD WITHOUT CONTRAST TECHNIQUE: Contiguous axial images were obtained from the base of the skull through the vertex without intravenous contrast. 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 This is stable in appearance from the prior exam. No findings to suggest acute hemorrhage, acute infarction or space-occupying mass lesion are noted. Vascular: No hyperdense vessel or unexpected calcification. Skull: Normal. Negative for fracture or focal lesion. Sinuses/Orbits: No acute finding. Other: None IMPRESSION: Changes consistent with previous infarct stable from the prior exam. No acute abnormality noted. Electronically Signed   By: Inez Catalina M.D.   On: 09/21/2019 20:06   Burnard Bunting With/wo Tbi  Result Date: 10/01/2019 LOWER EXTREMITY DOPPLER STUDY Indications: Ulceration right ankle. High Risk Factors: Past history of smoking.  Performing Technologist: Alvia Grove RVT  Examination Guidelines: A complete evaluation includes at minimum, Doppler waveform signals and systolic blood pressure reading at the level of bilateral brachial, anterior tibial, and posterior tibial arteries, when vessel segments are accessible. Bilateral testing is considered an integral part of a complete examination. Photoelectric Plethysmograph (PPG) waveforms and toe systolic pressure readings are included as required and additional duplex testing as needed. Limited examinations for reoccurring indications may be performed as noted.  ABI Findings: +---------+------------------+-----+---------+----------------------------+ Right    Rt Pressure (mmHg)IndexWaveform Comment                      +---------+------------------+-----+---------+----------------------------+ Brachial 117                                                          +---------+------------------+-----+---------+----------------------------+ PTA      127               1.09 biphasic                              +---------+------------------+-----+---------+----------------------------+  DP       126               1.08 triphasic                              +---------+------------------+-----+---------+----------------------------+ Great Toe                                Not obtained due to movement +---------+------------------+-----+---------+----------------------------+ +---------+------------------+-----+---------+-------+ Left     Lt Pressure (mmHg)IndexWaveform Comment +---------+------------------+-----+---------+-------+ Brachial 116                                     +---------+------------------+-----+---------+-------+ PTA      92                0.79 triphasic        +---------+------------------+-----+---------+-------+ DP       103               0.88 biphasic         +---------+------------------+-----+---------+-------+ Great Toe100               0.85 Normal           +---------+------------------+-----+---------+-------+ +-------+-----------+-----------+------------+------------+ ABI/TBIToday's ABIToday's TBIPrevious ABIPrevious TBI +-------+-----------+-----------+------------+------------+ Right  1.09                                           +-------+-----------+-----------+------------+------------+ Left   0.88       0.85                                +-------+-----------+-----------+------------+------------+  Summary: Right: Resting right ankle-brachial index is within normal range. No evidence of significant right lower extremity arterial disease. Unable to obtain toe-brachial index due to movement. Left: Resting left ankle-brachial index indicates mild left lower extremity arterial disease. The left toe-brachial index is normal. Technically difficult due to movement.  *See table(s) above for measurements and observations.  Electronically signed by Waverly Ferrari MD on 10/01/2019 at 2:56:42 PM.   Final    Vas [place] Lower Extremity Arterial Duplex  Result Date: 10/01/2019 LOWER EXTREMITY ARTERIAL DUPLEX STUDY Indications: Ulceration right leg. High Risk Factors: Past history of smoking.   Current ABI: Right 1.09 Left 0.88 Performing Technologist: Elita Quick RVT  Examination Guidelines: A complete evaluation includes B-mode imaging, spectral Doppler, color Doppler, and power Doppler as needed of all accessible portions of each vessel. Bilateral testing is considered an integral part of a complete examination. Limited examinations for reoccurring indications may be performed as noted.  +-----------+--------+-----+--------+---------+--------+ RIGHT      PSV cm/sRatioStenosisWaveform Comments +-----------+--------+-----+--------+---------+--------+ CFA Prox   144                  triphasic         +-----------+--------+-----+--------+---------+--------+ DFA        59                   triphasic         +-----------+--------+-----+--------+---------+--------+ SFA Prox   104                  triphasic         +-----------+--------+-----+--------+---------+--------+  SFA Mid    131                  triphasic         +-----------+--------+-----+--------+---------+--------+ SFA Distal 133                  triphasic         +-----------+--------+-----+--------+---------+--------+ POP Mid    206                  triphasic         +-----------+--------+-----+--------+---------+--------+ ATA Distal 77                   triphasic         +-----------+--------+-----+--------+---------+--------+ PTA Distal 77                   triphasic         +-----------+--------+-----+--------+---------+--------+ PERO Distal36                   biphasic          +-----------+--------+-----+--------+---------+--------+ Enlarged lymph node right groin.  Summary: Right: Normal examination. No evidence of significant arterial occlusive disease.  See table(s) above for measurements and observations. Electronically signed by Waverly Ferrari MD on 10/01/2019 at 3:10:14 PM.    Final     Patient benefited maximally from their hospital stay and there were no  complications.     Disposition: Discharge disposition: 01-Home or Self Care      Discharge Instructions    Call MD / Call 911   Complete by: As directed    If you experience chest pain or shortness of breath, CALL 911 and be transported to the hospital emergency room.  If you develope a fever above 101 F, pus (white drainage) or increased drainage or redness at the wound, or calf pain, call your surgeon's office.   Constipation Prevention   Complete by: As directed    Drink plenty of fluids.  Prune juice may be helpful.  You may use a stool softener, such as Colace (over the counter) 100 mg twice a day.  Use MiraLax (over the counter) for constipation as needed.   Diet - low sodium heart healthy   Complete by: As directed    Discharge instructions   Complete by: As directed    NWB Elevate  Right Foot above Heart level   Increase activity slowly as tolerated   Complete by: As directed    Neg Press Wound Therapy / Incisional   Complete by: As directed    Teach patient how to attach prevena pump     Follow-up Information    Nadara Mustard, MD Follow up in 1 week(s).   Specialty: Orthopedic Surgery Contact information: [address] [place] [address] [phone]            Signed: [place] Charlena Haub 10/16/2019, 7:17 AM

## 2019-10-16 NOTE — Evaluation (Signed)
Occupational Therapy Evaluation Patient Details Name: Briana Andrews MRN: [id number] DOB: June 09, 1966 Today's Date: 10/16/2019    History of Present Illness Pt is a 53 y/o female s/p R foot debridement secondary to gangrene with wound VAC placement. PMH including but not limited to CHF and CAD.   Clinical Impression   Pt with decline in function and safety with ADLs and ADL mobility with impaired balance and endurance. Pt reports that PTA, she lived at home with her husband and 2 adult sons and was independent with ADLs/selfcare and use a RW for mobility. Pt currently requires min A for LB ADLs, min A for toileting and min guard A for mobility using RW. Pt would benefit from acute OT services to address impairments to maximize level of function and safety    Follow Up Recommendations  No OT follow up;Supervision - Intermittent    Equipment Recommendations  None recommended by OT    Recommendations for Other Services       Precautions / Restrictions Precautions Precautions: Fall Precaution Comments: wound VAC R foot Restrictions Weight Bearing Restrictions: Yes RLE Weight Bearing: Non weight bearing      Mobility Bed Mobility Overal bed mobility: Needs Assistance Bed Mobility: Sit to Supine     Supine to sit: Supervision Sit to supine: Min guard   General bed mobility comments: pt in recliner upon arrival. Assietd pt back to bed min guard A at end of session  Transfers Overall transfer level: Needs assistance Equipment used: Rolling walker (2 wheeled) Transfers: Sit to/from Stand Sit to Stand: Min guard         General transfer comment: PT provided demonstration and instruction on technique with use of RW and NWB R LE; min guard for safety    Balance Overall balance assessment: Needs assistance Sitting-balance support: Feet supported Sitting balance-Leahy Scale: Good     Standing balance support: Bilateral upper extremity supported;Single extremity  supported;During functional activity Standing balance-Leahy Scale: Poor                             ADL either performed or assessed with clinical judgement   ADL Overall ADL's : Needs assistance/impaired Eating/Feeding: Independent;Sitting   Grooming: Wash/dry hands;Wash/dry face;Set up;Sitting   Upper Body Bathing: Set up;Sitting   Lower Body Bathing: Minimal assistance   Upper Body Dressing : Set up;Sitting   Lower Body Dressing: Minimal assistance   Toilet Transfer: Min guard;Ambulation;RW;Comfort height toilet;Regular Toilet;BSC;Grab bars   Toileting- Clothing Manipulation and Hygiene: Minimal assistance;Sit to/from stand       Functional mobility during ADLs: Min guard;Rolling walker       Vision Baseline Vision/History: Wears glasses Wears Glasses: Reading only Patient Visual Report: No change from baseline       Perception     Praxis      Pertinent Vitals/Pain Pain Assessment: 0-10 Pain Score: 5  Pain Location: R foot Pain Descriptors / Indicators: Sore;Constant Pain Intervention(s): Monitored during session;Premedicated before session;Repositioned     Hand Dominance Right   Extremity/Trunk Assessment Upper Extremity Assessment Upper Extremity Assessment: Overall WFL for tasks assessed   Lower Extremity Assessment Lower Extremity Assessment: Defer to PT evaluation RLE Deficits / Details: wound VAC in place at foot; pt able to maintain NWB R LE throughout independently   Cervical / Trunk Assessment Cervical / Trunk Assessment: Normal   Communication Communication Communication: No difficulties   Cognition Arousal/Alertness: Awake/alert Behavior During Therapy: WFL for tasks assessed/performed Overall  Cognitive Status: Within Functional Limits for tasks assessed                                     General Comments       Exercises     Shoulder Instructions      Home Living Family/patient expects to be  discharged to:: Private residence Living Arrangements: Spouse/significant other;Children Available Help at Discharge: Family;Available 24 hours/day Type of Home: House Home Access: Ramped entrance     Home Layout: One level     Bathroom Shower/Tub: Walk-in shower;Tub/shower unit   Bathroom Toilet: Standard     Home Equipment: Environmental consultant - 2 wheels;Bedside commode;Hospital bed;Wheelchair - Scientist, physiological: Reacher        Prior Functioning/Environment Level of Independence: Independent with assistive device(s)    ADL's / Homemaking Assistance Needed: pt reports that she was independent with ADLs/selfcare   Comments: was using RW or w/c for mobility secondary to foot pain        OT Problem List: Impaired balance (sitting and/or standing);Pain;Decreased activity tolerance;Decreased coordination;Decreased knowledge of use of DME or AE      OT Treatment/Interventions: Self-care/ADL training;DME and/or AE instruction;Therapeutic activities;Patient/family education;Balance training    OT Goals(Current goals can be found in the care plan section) Acute Rehab OT Goals Patient Stated Goal: decrease pain, go home OT Goal Formulation: With patient Time For Goal Achievement: 10/30/19 ADL Goals Pt Will Perform Grooming: with min guard assist;with supervision;with set-up;standing;with caregiver independent in assisting Pt Will Perform Lower Body Bathing: with min guard assist;with supervision;sit to/from stand;with caregiver independent in assisting Pt Will Perform Lower Body Dressing: with min guard assist;with supervision;sit to/from stand;with caregiver independent in assisting Pt Will Transfer to Toilet: with supervision;ambulating;regular height toilet;bedside commode;grab bars Pt Will Perform Toileting - Clothing Manipulation and hygiene: with min guard assist;with supervision;sit to/from stand  OT Frequency: Min 2X/week   Barriers to D/C:    no  barriers       Co-evaluation              AM-PAC OT "6 Clicks" Daily Activity     Outcome Measure Help from another person eating meals?: None Help from another person taking care of personal grooming?: None Help from another person toileting, which includes using toliet, bedpan, or urinal?: A Little Help from another person bathing (including washing, rinsing, drying)?: A Little Help from another person to put on and taking off regular upper body clothing?: None Help from another person to put on and taking off regular lower body clothing?: A Little 6 Click Score: 21   End of Session Equipment Utilized During Treatment: Gait belt;Rolling walker;Other (comment)(3 in 1)  Activity Tolerance: Patient tolerated treatment well Patient left: in bed;with call bell/phone within reach  OT Visit Diagnosis: Unsteadiness on feet (R26.81);Other abnormalities of gait and mobility (R26.89);Pain Pain - Right/Left: Right Pain - part of body: Ankle and joints of foot                Time: 1025-8527 OT Time Calculation (min): 21 min Charges:  OT General Charges $OT Visit: 1 Visit OT Evaluation $OT Eval Moderate Complexity: 1 Mod    Britt Bottom 10/16/2019, 10:18 AM

## 2019-10-16 NOTE — Progress Notes (Signed)
A discharge packet has been printed and provided to the patient.  The discharge information was reviewed with the patient and the patient verbalizes understanding.  The patient plans to go home after 4 pm due to transportation.

## 2019-10-16 NOTE — Progress Notes (Signed)
Portable Prevenna in place with instructions.  Information booklet provided.  The patient verbalizes understanding those instructions.

## 2019-10-16 NOTE — Care Management (Signed)
CM consult acknowledged to assist with any HH/DME needs. Awaiting PT/OT eval for DCP recommendations and will continue to follow.  Rupal Childress RN, BSN, NCM-BC, ACM-RN [phone] 

## 2019-10-16 NOTE — Evaluation (Signed)
Physical Therapy Evaluation Patient Details Name: Briana Andrews MRN: [id number] DOB: [birth year] Today's Date: 10/16/2019   History of Present Illness  Pt is a 53 y/o female s/p R foot debridement secondary to gangrene with wound VAC placement. PMH including but not limited to CHF and CAD.    Clinical Impression  Pt presented supine in bed with HOB elevated, awake and willing to participate in therapy session. Prior to admission, pt was using a RW and w/c to mobilize within her home since initial issue with her R foot beginning in September. Pt lives with her spouse and two adult sons in a single level home with a ramped entrance. At the time of evaluation, pt moving well overall. She was able to perform bed mobility with supervision, transfers with min guard and hop a few feet on L LE with RW and min guard for safety. PT will continue to follow pt acutely to progress mobility as tolerated and to ensure a safe d/c home.    Follow Up Recommendations No PT follow up;Supervision for mobility/OOB    Equipment Recommendations  None recommended by PT;Other (comment)(pt has all DME at home)    Recommendations for Other Services       Precautions / Restrictions Precautions Precautions: Fall Precaution Comments: wound VAC R foot Restrictions Weight Bearing Restrictions: Yes RLE Weight Bearing: Non weight bearing      Mobility  Bed Mobility Overal bed mobility: Needs Assistance Bed Mobility: Supine to Sit     Supine to sit: Supervision     General bed mobility comments: for safety  Transfers Overall transfer level: Needs assistance Equipment used: Rolling walker (2 wheeled) Transfers: Sit to/from Stand Sit to Stand: Min guard         General transfer comment: PT provided demonstration and instruction on technique with use of RW and NWB R LE; min guard for safety  Ambulation/Gait Ambulation/Gait assistance: Min guard Gait Distance (Feet): 5 Feet Assistive device: Rolling  walker (2 wheeled) Gait Pattern/deviations: (hop-to on L LE) Gait velocity: decreased   General Gait Details: pt able to take short hops on L foot while maintaining NWB R LE with min guard for safety; no LOB or instability noted  Stairs            Wheelchair Mobility    Modified Rankin (Stroke Patients Only)       Balance Overall balance assessment: Needs assistance Sitting-balance support: Feet supported Sitting balance-Leahy Scale: Good     Standing balance support: Bilateral upper extremity supported;Single extremity supported Standing balance-Leahy Scale: Poor                               Pertinent Vitals/Pain Pain Assessment: 0-10 Pain Score: 5  Pain Location: R foot Pain Descriptors / Indicators: Sore Pain Intervention(s): Monitored during session;Repositioned    Home Living Family/patient expects to be discharged to:: Private residence Living Arrangements: Spouse/significant other;Children Available Help at Discharge: Family;Available 24 hours/day Type of Home: House Home Access: Ramped entrance     Home Layout: One level Home Equipment: Walker - 2 wheels;Bedside commode;Hospital bed;Wheelchair - manual      Prior Function Level of Independence: Independent with assistive device(s)         Comments: was using RW or w/c for mobility secondary to foot pain     Hand Dominance        Extremity/Trunk Assessment   Upper Extremity Assessment Upper Extremity Assessment: Overall [facility]  for tasks assessed    Lower Extremity Assessment Lower Extremity Assessment: Overall WFL for tasks assessed;RLE deficits/detail RLE Deficits / Details: wound VAC in place at foot; pt able to maintain NWB R LE throughout independently    Cervical / Trunk Assessment Cervical / Trunk Assessment: Normal  Communication   Communication: No difficulties  Cognition Arousal/Alertness: Awake/alert Behavior During Therapy: WFL for tasks  assessed/performed Overall Cognitive Status: Within Functional Limits for tasks assessed                                        General Comments      Exercises     Assessment/Plan    PT Assessment Patient needs continued PT services  PT Problem List Decreased strength;Decreased range of motion;Decreased activity tolerance;Decreased balance;Decreased mobility;Decreased coordination;Decreased knowledge of use of DME;Decreased safety awareness;Decreased knowledge of precautions;Pain       PT Treatment Interventions DME instruction;Gait training;Stair training;Functional mobility training;Therapeutic activities;Therapeutic exercise;Balance training;Neuromuscular re-education;Patient/family education;Wheelchair mobility training    PT Goals (Current goals can be found in the Care Plan section)  Acute Rehab PT Goals Patient Stated Goal: decrease pain PT Goal Formulation: With patient Time For Goal Achievement: 10/30/19 Potential to Achieve Goals: Good    Frequency Min 3X/week   Barriers to discharge        Co-evaluation               AM-PAC PT "6 Clicks" Mobility  Outcome Measure Help needed turning from your back to your side while in a flat bed without using bedrails?: None Help needed moving from lying on your back to sitting on the side of a flat bed without using bedrails?: None Help needed moving to and from a bed to a chair (including a wheelchair)?: None Help needed standing up from a chair using your arms (e.g., wheelchair or bedside chair)?: None Help needed to walk in hospital room?: A Little Help needed climbing 3-5 steps with a railing? : A Lot 6 Click Score: 21    End of Session Equipment Utilized During Treatment: Gait belt Activity Tolerance: Patient tolerated treatment well Patient left: in chair;with call bell/phone within reach Nurse Communication: Mobility status PT Visit Diagnosis: Other abnormalities of gait and mobility  (R26.89);Pain Pain - Right/Left: Right Pain - part of body: Ankle and joints of foot    Time: 0737-1062 PT Time Calculation (min) (ACUTE ONLY): 18 min   Charges:   PT Evaluation $PT Eval Moderate Complexity: 1 Mod          Eduard Clos, PT, DPT  Acute Rehabilitation Services Pager [phone] Office [place] 10/16/2019, 9:51 AM

## 2019-10-20 ENCOUNTER — Other Ambulatory Visit: Payer: Self-pay

## 2019-10-20 ENCOUNTER — Ambulatory Visit (INDEPENDENT_AMBULATORY_CARE_PROVIDER_SITE_OTHER): Payer: 59

## 2019-10-20 DIAGNOSIS — I240 Acute coronary thrombosis not resulting in myocardial infarction: Secondary | ICD-10-CM

## 2019-10-20 DIAGNOSIS — Z5181 Encounter for therapeutic drug level monitoring: Secondary | ICD-10-CM

## 2019-10-20 LAB — POCT INR: INR: 1.6 — AB (ref 2.0–3.0)

## 2019-10-20 NOTE — Patient Instructions (Signed)
-   Take 2 tablets tonight (5 mg) - Then resume previous dosage of 1 tablet (2.5 mg) every day EXCEPT 1.5 TABLETS ON MONDAYS & FRIDAYS.  -  Recheck INR in 2 weeks.

## 2019-10-22 ENCOUNTER — Other Ambulatory Visit: Payer: Self-pay

## 2019-10-22 ENCOUNTER — Ambulatory Visit (INDEPENDENT_AMBULATORY_CARE_PROVIDER_SITE_OTHER): Payer: 59 | Admitting: Family

## 2019-10-22 ENCOUNTER — Encounter: Payer: Self-pay | Admitting: Family

## 2019-10-22 VITALS — Ht 65.0 in | Wt 138.0 lb

## 2019-10-22 DIAGNOSIS — I96 Gangrene, not elsewhere classified: Secondary | ICD-10-CM

## 2019-10-22 NOTE — Progress Notes (Signed)
Office Visit Note   Patient: Briana Andrews           Date of Birth: [birth year]           MRN: [id number] Visit Date: 10/22/2019              Requested by: No referring provider defined for this encounter. PCP: Patient, [name]  Chief Complaint  Patient presents with  . Right Foot - Routine Post Op    10/15/2019 right foot deb & SG      HPI: The patient presents today 1 week status post right foot debridement and skin graft she is doing well and the wound VAC was removed today  Assessment & Plan: Visit Diagnoses: No diagnosis found.  Plan: She will do daily dry dressing changes and may use a mild soap and tepid water I warned her not to use water that was too warm she will follow-up in 1 week  Follow-Up Instructions: No follow-ups on file.   Ortho Exam  Patient is alert, oriented, no adenopathy, well-dressed, normal affect, normal respiratory effort. Exam of her right foot demonstrates graft in place mild to moderate soft tissue swelling no foul odor graft appears viable  Imaging: No results found. No images are attached to the encounter.  Labs: Lab Results  Component Value Date   HGBA1C 5.8 (H) 08/17/2019   ESRSEDRATE 0 08/16/2019   REPTSTATUS 08/19/2019 FINAL 08/18/2019   CULT  08/18/2019    NO GROWTH Performed at [facility], [place] [address] [place], [place] [address]      Lab Results  Component Value Date   ALBUMIN 2.6 (L) 10/15/2019   ALBUMIN 1.2 (L) 09/05/2019   ALBUMIN 1.3 (L) 09/03/2019    Lab Results  Component Value Date   MG 2.1 09/08/2019   MG 1.4 (L) 09/07/2019   MG 1.9 09/02/2019   No results found for: VD25OH  No results found for: PREALBUMIN CBC EXTENDED Latest Ref Rng & Units 10/15/2019 09/29/2019 09/13/2019  WBC 4.0 - 10.5 K/uL 7.4 8.3 9.0  RBC 3.87 - 5.11 MIL/uL 3.38(L) 3.21(L) 2.67(L)  HGB 12.0 - 15.0 g/dL 10.6(L) 10.2(L) 8.4(L)  HCT 36.0 - 46.0 % 32.8(L) 31.4(L) 25.4(L)  PLT 150 - 400 K/uL 535(H) 680(H) 455(H)   NEUTROABS 1.7 - 7.7 K/uL - - -  LYMPHSABS 0.7 - 4.0 K/uL - - -     Body mass index is 22.97 kg/m.  Orders:  No orders of the defined types were placed in this encounter.  No orders of the defined types were placed in this encounter.    Procedures: No procedures performed  Clinical Data: No additional findings.  ROS:  All other systems negative, except as noted in the HPI. Review of Systems  Objective: Vital Signs: Ht 5\' 5"  (1.651 m)   Wt 138 lb 0.2 oz (62.6 kg)   BMI 22.97 kg/m   Specialty Comments:  No specialty comments available.  PMFS History: Patient Active Problem List   Diagnosis Date Noted  . Gangrene of right foot ([place])   . Cutaneous abscess of right foot   . Endotracheally intubated   . Acute on chronic systolic CHF (congestive heart failure) ([place])   . GI bleed 08/26/2019  . Pressure injury of skin 08/23/2019  . Acute upper GI bleed   . Shock circulatory ([place])   . Palliative care by specialist   . Goals of care, counseling/discussion   . Advanced care planning/counseling discussion   .  Jaundice   . Cerebral embolism with cerebral infarction 08/17/2019  . PVD (peripheral vascular disease) (HCC) 08/17/2019  . 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 Acute CHF (congestive heart failure) (HCC) 08/17/2019  . Abnormal LFTs 08/16/2019  . Acute metabolic encephalopathy 08/16/2019  . SOB (shortness of breath) 08/16/2019  . AKI (acute kidney injury) (HCC) 08/16/2019  . Fall 08/16/2019  . Lactic acidosis 08/16/2019  . Back pain 08/16/2019  . Liver failure without hepatic coma (HCC) 08/16/2019  . Multifocal pneumonia 08/16/2019  . Alcohol abuse   . Tobacco abuse   . Shortness of breath    Past Medical History:  Diagnosis Date  . Alcohol abuse   . CHF (congestive heart failure) (HCC)    last EF 35-40%. etoh induced  . Coronary artery disease    80%RCA, 50%LCX, 40%LAD  . Depression   . DVT (deep venous thrombosis) (HCC)     right popliteal vein (08/17/19)  . GERD (gastroesophageal reflux disease)   . Hyperlipidemia   . LV (left ventricular) mural thrombus   . Stroke [facility])    / memory loss  . Tobacco abuse     Family History  Problem Relation Age of Onset  . Heart disease Mother     Past Surgical History:  Procedure Laterality Date  . ABDOMINAL AORTOGRAM W/LOWER EXTREMITY N/A 08/19/2019   Procedure: ABDOMINAL AORTOGRAM W/LOWER EXTREMITY;  Surgeon: Nada Libman, MD;  Location: [facility] INVASIVE CV LAB;  Service: Cardiovascular;  Laterality: N/A;  . ABDOMINAL AORTOGRAM W/LOWER EXTREMITY Bilateral 08/20/2019   Procedure: ABDOMINAL AORTOGRAM W/LOWER EXTREMITY;  Surgeon: Cephus Shelling, MD;  Location: [facility] INVASIVE CV LAB;  Service: Cardiovascular;  Laterality: Bilateral;  . DEBRIDEMENT  FOOT Right 10/15/2019  . ESOPHAGOGASTRODUODENOSCOPY N/A 08/26/2019   Procedure: ESOPHAGOGASTRODUODENOSCOPY (EGD);  Surgeon: Charlott Rakes, MD;  Location[facility] ENDOSCOPY;  Service: Endoscopy;  Laterality: N/A;  . HOT HEMOSTASIS N/A 08/26/2019   Procedure: HOT HEMOSTASIS (ARGON PLASMA COAGULATION/BICAP);  Surgeon: Charlott Rakes, MD;  Location: [facility] ENDOSCOPY;  Service: Endoscopy;  Laterality: N/A;  . I&D EXTREMITY Right 10/15/2019   Procedure: RIGHT FOOT DEBRIDEMENT;  Surgeon: Nadara Mustard, MD;  Location: [facility] OR;  Service: Orthopedics;  Laterality: Right;  . PERIPHERAL VASCULAR BALLOON ANGIOPLASTY  08/19/2019   Procedure: PERIPHERAL VASCULAR BALLOON ANGIOPLASTY;  Surgeon: Nada Libman, MD;  Location: [facility] INVASIVE CV LAB;  Service: Cardiovascular;;  Right femoral popliteal and PT   . PERIPHERAL VASCULAR INTERVENTION Left 08/20/2019   Procedure: PERIPHERAL VASCULAR INTERVENTION;  Surgeon: Cephus Shelling, MD;  Location: [facility] INVASIVE CV LAB;  Service: Cardiovascular;  Laterality: Left;  . RIGHT/LEFT HEART CATH AND CORONARY ANGIOGRAPHY N/A 09/02/2019   Procedure: RIGHT/LEFT HEART CATH AND CORONARY ANGIOGRAPHY;  Surgeon: Laurey Morale, MD;  Location: [facility] INVASIVE CV LAB;  Service: Cardiovascular;  Laterality: N/A;  . SCLEROTHERAPY  08/26/2019   Procedure: SCLEROTHERAPY;  Surgeon: Charlott Rakes, MD;  Location: [facility] ENDOSCOPY;  Service: Endoscopy;;   Social History   Occupational History  . Not on file  Tobacco Use  . Smoking status: Former Smoker    Years: 39.00    Quit date: 06/2019    Years since quitting: 0.3  . Smokeless tobacco: Never Used  Substance and Sexual Activity  . Alcohol use: Not Currently    Comment: alcoholism - last drink in august 2020  . Drug use: Not Currently    Types: Marijuana    Comment: none since August 2020  . Sexual activity: Not  on file

## 2019-10-29 ENCOUNTER — Ambulatory Visit (INDEPENDENT_AMBULATORY_CARE_PROVIDER_SITE_OTHER): Payer: 59 | Admitting: Physician Assistant

## 2019-10-29 ENCOUNTER — Other Ambulatory Visit: Payer: Self-pay

## 2019-10-29 ENCOUNTER — Encounter: Payer: Self-pay | Admitting: Physician Assistant

## 2019-10-29 VITALS — Ht 65.0 in | Wt 138.0 lb

## 2019-10-29 DIAGNOSIS — I96 Gangrene, not elsewhere classified: Secondary | ICD-10-CM

## 2019-10-29 MED ORDER — SILVER SULFADIAZINE 1 % EX CREA: 1.0000 "application " | TOPICAL_CREAM | Freq: Every day | CUTANEOUS | 0 refills | Status: DC

## 2019-10-29 MED ORDER — HYDROCODONE-ACETAMINOPHEN 5-325 MG PO TABS: 1.0000 | ORAL_TABLET | ORAL | 0 refills | Status: DC | PRN

## 2019-10-29 NOTE — Progress Notes (Addendum)
Office Visit Note   Patient: Briana Andrews           Date of Birth: 1966/10/22           MRN: [id number] Visit Date: 10/29/2019              Requested by: No referring provider defined for this encounter. PCP: Patient, [name]  Chief Complaint  Patient presents with  . Right Foot - Routine Post Op    10/15/19 right foot debridement and STSG       HPI: The patient presents today 2 weeks status post debridement of dorsal foot wound and skin grafting.  Her son has been doing her dressing changes.  She denies fever chills  Assessment & Plan: Visit Diagnoses: No diagnosis found.  Plan: I have called in some Silvadene for her dressing changes.  I have also refilled her hydrocodone and I would like for her to take her pain medication before she comes here to have the staples removed in a week.    Follow-Up Instructions: No follow-ups on file.   Ortho Exam  Patient is alert, oriented, no adenopathy, well-dressed, normal affect, normal respiratory effort. Examination of her foot she has some good incorporation of the grafting on the more proximal ankle area there is some tissue that does not appear as robust.  Swelling is overall well controlled  Imaging: No results found. No images are attached to the encounter.  Labs: Lab Results  Component Value Date   HGBA1C 5.8 (H) 08/17/2019   ESRSEDRATE 0 08/16/2019   REPTSTATUS 08/19/2019 FINAL 08/18/2019   CULT  08/18/2019    NO GROWTH Performed at [facility], [address] [place], [place] [address]      Lab Results  Component Value Date   ALBUMIN 2.6 (L) 10/15/2019   ALBUMIN 1.2 (L) 09/05/2019   ALBUMIN 1.3 (L) 09/03/2019    Lab Results  Component Value Date   MG 2.1 09/08/2019   MG 1.4 (L) 09/07/2019   MG 1.9 09/02/2019   No results found for: VD25OH  No results found for: PREALBUMIN CBC EXTENDED Latest Ref Rng & Units 10/15/2019 09/29/2019 09/13/2019  WBC 4.0 - 10.5 K/uL 7.4 8.3 9.0  RBC 3.87 -  5.11 MIL/uL 3.38(L) 3.21(L) 2.67(L)  HGB 12.0 - 15.0 g/dL 10.6(L) 10.2(L) 8.4(L)  HCT 36.0 - 46.0 % 32.8(L) 31.4(L) 25.4(L)  PLT 150 - 400 K/uL 535(H) 680(H) 455(H)  NEUTROABS 1.7 - 7.7 K/uL - - -  LYMPHSABS 0.7 - 4.0 K/uL - - -     Body mass index is 22.96 kg/m.  Orders:  No orders of the defined types were placed in this encounter.  Meds ordered this encounter  Medications  . silver sulfADIAZINE (SILVADENE) 1 % cream    Sig: Apply 1 application topically daily. Apply thin layer to foot wound    Dispense:  50 g    Refill:  0  . DISCONTD: HYDROcodone-acetaminophen (NORCO/VICODIN) 5-325 MG tablet    Sig: Take 1 tablet by mouth every 4 (four) hours as needed for moderate pain.    Dispense:  30 tablet    Refill:  0  . HYDROcodone-acetaminophen (NORCO/VICODIN) 5-325 MG tablet    Sig: Take 1 tablet by mouth every 4 (four) hours as needed for moderate pain.    Dispense:  30 tablet    Refill:  0     Procedures: No procedures performed  Clinical Data: No additional findings.  ROS:  All  other systems negative, except as noted in the HPI. Review of Systems  Objective: Vital Signs: Ht 5\' 5"  (1.651 m)   Wt 138 lb (62.6 kg)   BMI 22.96 kg/m   Specialty Comments:  No specialty comments available.  PMFS History: Patient Active Problem List   Diagnosis Date Noted  . Gangrene of right foot ([place])   . Cutaneous abscess of right foot   . Endotracheally intubated   . Acute on chronic systolic CHF (congestive heart failure) ([place])   . GI bleed 08/26/2019  . Pressure injury of skin 08/23/2019  . Acute upper GI bleed   . Shock circulatory ([place])   . Palliative care by specialist   . Goals of care, counseling/discussion   . Advanced care planning/counseling discussion   . Jaundice   . Cerebral embolism with cerebral infarction 08/17/2019  . PVD (peripheral vascular disease) ([place]) 08/17/2019  . Left ventricular apical thrombus without MI ([place]) 08/17/2019  . Acute CHF  (congestive heart failure) ([place]) 08/17/2019  . Abnormal LFTs 08/16/2019  . Acute metabolic encephalopathy 40/98/1191  . SOB (shortness of breath) 08/16/2019  . AKI (acute kidney injury) ([place]) 08/16/2019  . Fall 08/16/2019  . Lactic acidosis 08/16/2019  . Back pain 08/16/2019  . Liver failure without hepatic coma ([place]) 08/16/2019  . Multifocal pneumonia 08/16/2019  . Alcohol abuse   . Tobacco abuse   . Shortness of breath    Past Medical History:  Diagnosis Date  . Alcohol abuse   . CHF (congestive heart failure) ([place])    last EF 35-40%. etoh induced  . Coronary artery disease    80%RCA, 50%LCX, 40%LAD  . Depression   . DVT (deep venous thrombosis) (HCC)    right popliteal vein (08/17/19)  . GERD (gastroesophageal reflux disease)   . Hyperlipidemia   . LV (left ventricular) mural thrombus   . Stroke [facility])    / memory loss  . Tobacco abuse     Family History  Problem Relation Age of Onset  . Heart disease Mother     Past Surgical History:  Procedure Laterality Date  . ABDOMINAL AORTOGRAM W/LOWER EXTREMITY N/A 08/19/2019   Procedure: ABDOMINAL AORTOGRAM W/LOWER EXTREMITY;  Surgeon: Serafina Mitchell, MD;  Location: [place] CV LAB;  Service: Cardiovascular;  Laterality: N/A;  . ABDOMINAL AORTOGRAM W/LOWER EXTREMITY Bilateral 08/20/2019   Procedure: ABDOMINAL AORTOGRAM W/LOWER EXTREMITY;  Surgeon: Marty Heck, MD;  Location: [place] CV LAB;  Service: Cardiovascular;  Laterality: Bilateral;  . DEBRIDEMENT  FOOT Right 10/15/2019  . ESOPHAGOGASTRODUODENOSCOPY N/A 08/26/2019   Procedure: ESOPHAGOGASTRODUODENOSCOPY (EGD);  Surgeon: Wilford Corner, MD;  Location: [place];  Service: Endoscopy;  Laterality: N/A;  . HOT HEMOSTASIS N/A 08/26/2019   Procedure: HOT HEMOSTASIS (ARGON PLASMA COAGULATION/BICAP);  Surgeon: Wilford Corner, MD;  Location: [place];  Service: Endoscopy;  Laterality: N/A;  . I&D EXTREMITY Right 10/15/2019   Procedure: RIGHT FOOT  DEBRIDEMENT;  Surgeon: Newt Minion, MD;  Location: [place];  Service: Orthopedics;  Laterality: Right;  . PERIPHERAL VASCULAR BALLOON ANGIOPLASTY  08/19/2019   Procedure: PERIPHERAL VASCULAR BALLOON ANGIOPLASTY;  Surgeon: Serafina Mitchell, MD;  Location: [place] CV LAB;  Service: Cardiovascular;;  Right femoral popliteal and PT   . PERIPHERAL VASCULAR INTERVENTION Left 08/20/2019   Procedure: PERIPHERAL VASCULAR INTERVENTION;  Surgeon: Marty Heck, MD;  Location: [place] CV LAB;  Service: Cardiovascular;  Laterality: Left;  . RIGHT/LEFT HEART CATH AND CORONARY ANGIOGRAPHY N/A 09/02/2019   Procedure: RIGHT/LEFT HEART  CATH AND CORONARY ANGIOGRAPHY;  Surgeon: Laurey Morale, MD;  Location: [facility] INVASIVE CV LAB;  Service: Cardiovascular;  Laterality: N/A;  . SCLEROTHERAPY  08/26/2019   Procedure: SCLEROTHERAPY;  Surgeon: Charlott Rakes, MD;  Location: [facility] ENDOSCOPY;  Service: Endoscopy;;   Social History   Occupational History  . Not on file  Tobacco Use  . Smoking status: Former Smoker    Years: 39.00    Quit date: 06/2019    Years since quitting: 0.3  . Smokeless tobacco: Never Used  Substance and Sexual Activity  . Alcohol use: Not Currently    Comment: alcoholism - last drink in august 2020  . Drug use: Not Currently    Types: Marijuana    Comment: none since August 2020  . Sexual activity: Not on file

## 2019-11-03 ENCOUNTER — Other Ambulatory Visit: Payer: Self-pay

## 2019-11-03 ENCOUNTER — Telehealth: Payer: Self-pay | Admitting: Physician Assistant

## 2019-11-03 ENCOUNTER — Ambulatory Visit (INDEPENDENT_AMBULATORY_CARE_PROVIDER_SITE_OTHER): Payer: 59

## 2019-11-03 ENCOUNTER — Other Ambulatory Visit: Payer: Self-pay | Admitting: Physician Assistant

## 2019-11-03 DIAGNOSIS — I240 Acute coronary thrombosis not resulting in myocardial infarction: Secondary | ICD-10-CM

## 2019-11-03 DIAGNOSIS — Z5181 Encounter for therapeutic drug level monitoring: Secondary | ICD-10-CM | POA: Diagnosis not present

## 2019-11-03 LAB — POCT INR: INR: 1.9 — AB (ref 2.0–3.0)

## 2019-11-03 MED ORDER — TRAMADOL HCL 50 MG PO TABS: 50.0000 mg | ORAL_TABLET | Freq: Four times a day (QID) | ORAL | 0 refills | Status: DC | PRN

## 2019-11-03 NOTE — Telephone Encounter (Signed)
Briana Andrews please advise. Patient has had Tramadol, Oxycodone, and Hydrocodone. Thank you and would you like for patient to stop taking the medication due to vomiting?

## 2019-11-03 NOTE — Patient Instructions (Signed)
-   Take 2 tablets tonight (5 mg) - Then START NEW DOSAGE of 1 tablet (2.5 mg) every day EXCEPT 1.5 TABLETS ON MONDAYS, [place].  - Recheck INR in 2 weeks.

## 2019-11-03 NOTE — Telephone Encounter (Signed)
Patient called and requesting new medication for pains. Patient states PA Audrea Muscat Persons prescribed oxycodone. Patient states oxycodon make her vomit. Patient for new pain medication to be called in to [facility] [address] in [place]. Patient asked for a call back. Patient number is [phone]

## 2019-11-03 NOTE — Telephone Encounter (Signed)
Tramadol I will call in

## 2019-11-04 ENCOUNTER — Telehealth: Payer: Self-pay | Admitting: Cardiology

## 2019-11-04 NOTE — Telephone Encounter (Signed)
Correction to previous phone note. Placed forms for physician to complete in nurse's box

## 2019-11-04 NOTE — Telephone Encounter (Signed)
Forms retrieved from nurse box- placed on Dr. Thereasa Solo desk for further review/ signature.

## 2019-11-04 NOTE — Telephone Encounter (Signed)
Received forms from [facility] for physician to complete, placed in MA box.

## 2019-11-05 ENCOUNTER — Inpatient Hospital Stay: Payer: 59 | Admitting: Adult Health

## 2019-11-05 ENCOUNTER — Other Ambulatory Visit: Payer: Self-pay

## 2019-11-05 ENCOUNTER — Encounter: Payer: Self-pay | Admitting: Physician Assistant

## 2019-11-05 ENCOUNTER — Ambulatory Visit (INDEPENDENT_AMBULATORY_CARE_PROVIDER_SITE_OTHER): Payer: 59 | Admitting: Physician Assistant

## 2019-11-05 VITALS — Ht 65.0 in | Wt 138.0 lb

## 2019-11-05 DIAGNOSIS — I96 Gangrene, not elsewhere classified: Secondary | ICD-10-CM

## 2019-11-05 NOTE — Progress Notes (Signed)
Office Visit Note   Patient: Briana Andrews           Date of Birth: [birth year]           MRN: [id number] Visit Date: 11/05/2019              Requested by: No referring provider defined for this encounter. PCP: Patient, [name]  Chief Complaint  Patient presents with  . Right Foot - Routine Post Op    10/15/19 right foot debridement and STSG       HPI: Patient presents today for follow-up on the skin grafting to the dorsum of her right foot.  Her son has been doing daily dressing changes with Silvadene.  Assessment & Plan: Visit Diagnoses: No diagnosis found.  Plan: She will continue to do daily dressing changes and I ask that they be careful not to put too much Silvadene on her foot I would like for her to use a week of antibiotics along with probiotics  Follow-Up Instructions: No follow-ups on file.   Ortho Exam  Patient is alert, oriented, no adenopathy, well-dressed, normal affect, normal respiratory effort. Right foot: Pulse is palpable with Doppler.  She has some good granulation tissue but some fibrous tissue proximally.  Swelling is well controlled.  No surrounding cellulitis.  No ingrowth of skin as of yet staples around the periphery were removed today  Imaging: No results found.   Labs: Lab Results  Component Value Date   HGBA1C 5.8 (H) 08/17/2019   ESRSEDRATE 0 08/16/2019   REPTSTATUS 08/19/2019 FINAL 08/18/2019   CULT  08/18/2019    NO GROWTH Performed at [facility], [address] [place], [place] [address]      Lab Results  Component Value Date   ALBUMIN 2.6 (L) 10/15/2019   ALBUMIN 1.2 (L) 09/05/2019   ALBUMIN 1.3 (L) 09/03/2019    Lab Results  Component Value Date   MG 2.1 09/08/2019   MG 1.4 (L) 09/07/2019   MG 1.9 09/02/2019   No results found for: VD25OH  No results found for: PREALBUMIN CBC EXTENDED Latest Ref Rng & Units 10/15/2019 09/29/2019 09/13/2019  WBC 4.0 - 10.5 K/uL 7.4 8.3 9.0  RBC 3.87 - 5.11 MIL/uL  3.38(L) 3.21(L) 2.67(L)  HGB 12.0 - 15.0 g/dL 10.6(L) 10.2(L) 8.4(L)  HCT 36.0 - 46.0 % 32.8(L) 31.4(L) 25.4(L)  PLT 150 - 400 K/uL 535(H) 680(H) 455(H)  NEUTROABS 1.7 - 7.7 K/uL - - -  LYMPHSABS 0.7 - 4.0 K/uL - - -     Body mass index is 22.96 kg/m.  Orders:  No orders of the defined types were placed in this encounter.  No orders of the defined types were placed in this encounter.    Procedures: No procedures performed  Clinical Data: No additional findings.  ROS:  All other systems negative, except as noted in the HPI. Review of Systems  Objective: Vital Signs: Ht 5\' 5"  (1.651 m)   Wt 138 lb (62.6 kg)   BMI 22.96 kg/m   Specialty Comments:  No specialty comments available.  PMFS History: Patient Active Problem List   Diagnosis Date Noted  . Gangrene of right foot (HCC)   . Cutaneous abscess of right foot   . Endotracheally intubated   . Acute on chronic systolic CHF (congestive heart failure) (HCC)   . GI bleed 08/26/2019  . Pressure injury of skin 08/23/2019  . Acute upper GI bleed   . Shock circulatory (HCC)   .  Palliative care by specialist   . Goals of care, counseling/discussion   . Advanced care planning/counseling discussion   . Jaundice   . Cerebral embolism with cerebral infarction 08/17/2019  . PVD (peripheral vascular disease) ([place]) 08/17/2019  . Left ventricular apical thrombus without MI ([place]) 08/17/2019  . Acute CHF (congestive heart failure) ([place]) 08/17/2019  . Abnormal LFTs 08/16/2019  . Acute metabolic encephalopathy 56/21/3086  . SOB (shortness of breath) 08/16/2019  . AKI (acute kidney injury) ([place]) 08/16/2019  . Fall 08/16/2019  . Lactic acidosis 08/16/2019  . Back pain 08/16/2019  . Liver failure without hepatic coma ([place]) 08/16/2019  . Multifocal pneumonia 08/16/2019  . Alcohol abuse   . Tobacco abuse   . Shortness of breath    Past Medical History:  Diagnosis Date  . Alcohol abuse   . CHF (congestive heart failure)  ([place])    last EF 35-40%. etoh induced  . Coronary artery disease    80%RCA, 50%LCX, 40%LAD  . Depression   . DVT (deep venous thrombosis) (HCC)    right popliteal vein (08/17/19)  . GERD (gastroesophageal reflux disease)   . Hyperlipidemia   . LV (left ventricular) mural thrombus   . Stroke [facility])    / memory loss  . Tobacco abuse     Family History  Problem Relation Age of Onset  . Heart disease Mother     Past Surgical History:  Procedure Laterality Date  . ABDOMINAL AORTOGRAM W/LOWER EXTREMITY N/A 08/19/2019   Procedure: ABDOMINAL AORTOGRAM W/LOWER EXTREMITY;  Surgeon: Serafina Mitchell, MD;  Location: [place] [facility];  Service: Cardiovascular;  Laterality: N/A;  . ABDOMINAL AORTOGRAM W/LOWER EXTREMITY Bilateral 08/20/2019   Procedure: ABDOMINAL AORTOGRAM W/LOWER EXTREMITY;  Surgeon: Marty Heck, MD;  Location: [place] [facility];  Service: Cardiovascular;  Laterality: Bilateral;  . DEBRIDEMENT  FOOT Right 10/15/2019  . ESOPHAGOGASTRODUODENOSCOPY N/A 08/26/2019   Procedure: ESOPHAGOGASTRODUODENOSCOPY (EGD);  Surgeon: Wilford Corner, MD;  Location: [place];  Service: Endoscopy;  Laterality: N/A;  . HOT HEMOSTASIS N/A 08/26/2019   Procedure: HOT HEMOSTASIS (ARGON PLASMA COAGULATION/BICAP);  Surgeon: Wilford Corner, MD;  Location: [place];  Service: Endoscopy;  Laterality: N/A;  . I&D EXTREMITY Right 10/15/2019   Procedure: RIGHT FOOT DEBRIDEMENT;  Surgeon: Newt Minion, MD;  Location: [place];  Service: Orthopedics;  Laterality: Right;  . PERIPHERAL VASCULAR BALLOON ANGIOPLASTY  08/19/2019   Procedure: PERIPHERAL VASCULAR BALLOON ANGIOPLASTY;  Surgeon: Serafina Mitchell, MD;  Location: [place] [facility];  Service: Cardiovascular;;  Right femoral popliteal and PT   . PERIPHERAL VASCULAR INTERVENTION Left 08/20/2019   Procedure: PERIPHERAL VASCULAR INTERVENTION;  Surgeon: Marty Heck, MD;  Location: [place] [facility];  Service: Cardiovascular;   Laterality: Left;  . RIGHT/LEFT HEART CATH AND CORONARY ANGIOGRAPHY N/A 09/02/2019   Procedure: RIGHT/LEFT HEART CATH AND CORONARY ANGIOGRAPHY;  Surgeon: Larey Dresser, MD;  Location: [place] [facility];  Service: Cardiovascular;  Laterality: N/A;  . SCLEROTHERAPY  08/26/2019   Procedure: SCLEROTHERAPY;  Surgeon: Wilford Corner, MD;  Location: [facility] ENDOS[facility];  Service: Endoscopy;;   Social History   Occupational History  . Not on file  Tobacco Use  . Smoking status: Former Smoker    Years: 39.00    Quit date: 06/2019    Years since quitting: 0.3  . Smokeless tobacco: Never Used  Substance and Sexual Activity  . Alcohol use: Not Currently    Comment: alcoholism - last drink in august 2020  . Drug  use: Not Currently    Types: Marijuana    Comment: none since August 2020  . Sexual activity: Not on file

## 2019-11-06 ENCOUNTER — Telehealth: Payer: Self-pay | Admitting: Cardiology

## 2019-11-06 ENCOUNTER — Ambulatory Visit: Payer: 59 | Admitting: Cardiology

## 2019-11-06 NOTE — Telephone Encounter (Signed)
Multiple attempts to reschedule appt. Lm with spouse to call office.

## 2019-11-07 ENCOUNTER — Telehealth: Payer: Self-pay | Admitting: Orthopedic Surgery

## 2019-11-07 ENCOUNTER — Telehealth: Payer: Self-pay | Admitting: Cardiology

## 2019-11-07 ENCOUNTER — Other Ambulatory Visit: Payer: Self-pay | Admitting: Physician Assistant

## 2019-11-07 MED ORDER — DOXYCYCLINE HYCLATE 100 MG PO TABS: 100.0000 mg | ORAL_TABLET | Freq: Two times a day (BID) | ORAL | 0 refills | Status: DC

## 2019-11-07 MED ORDER — SILVER SULFADIAZINE 1 % EX CREA: 1.0000 "application " | TOPICAL_CREAM | Freq: Every day | CUTANEOUS | 0 refills | Status: DC

## 2019-11-07 NOTE — Telephone Encounter (Signed)
Can you advise? 

## 2019-11-07 NOTE — Telephone Encounter (Signed)
Pt called in said she had surgery with Dr.Duda 10/15/2019, and pt said that she came in 11/05/2019 and they removed her staples and she wasn't given directions on how to care for the wound after. Pt also states that a prescription for antibiotic was suppose to be called in but the pharmacy didn't have anything for her.  Please give pt a call to advise   [phone]

## 2019-11-07 NOTE — Telephone Encounter (Signed)
Called in doxy. She will ck with her pcp. Also gave her a new rx for silvadene

## 2019-11-07 NOTE — Telephone Encounter (Signed)
Patient is starting on doxycycline. Will have her see Mandi on Monday 12/14 for INR check due to potential drug interaction

## 2019-11-07 NOTE — Telephone Encounter (Signed)
Please call to discuss an antibiotic that was prescribed to her. Pt is on coumadin.

## 2019-11-07 NOTE — Telephone Encounter (Signed)
Completed forms sent to [employer].

## 2019-11-10 ENCOUNTER — Telehealth: Payer: Self-pay | Admitting: Orthopedic Surgery

## 2019-11-10 ENCOUNTER — Ambulatory Visit (INDEPENDENT_AMBULATORY_CARE_PROVIDER_SITE_OTHER): Payer: 59

## 2019-11-10 ENCOUNTER — Other Ambulatory Visit: Payer: Self-pay

## 2019-11-10 DIAGNOSIS — Z5181 Encounter for therapeutic drug level monitoring: Secondary | ICD-10-CM

## 2019-11-10 DIAGNOSIS — I240 Acute coronary thrombosis not resulting in myocardial infarction: Secondary | ICD-10-CM | POA: Diagnosis not present

## 2019-11-10 LAB — POCT INR: INR: 2 (ref 2.0–3.0)

## 2019-11-10 MED ORDER — SILVER SULFADIAZINE 1 % EX CREA: 1.0000 "application " | TOPICAL_CREAM | Freq: Every day | CUTANEOUS | 0 refills | Status: DC

## 2019-11-10 NOTE — Telephone Encounter (Signed)
Patient called to request a refill Silver Sultadizine cream. Patient state [facility] [address] will not refill medication. Patient asking for Dr. Sharol Given nurse or assistant to call in for refill at pharmacy. Patient phone number is [phone].

## 2019-11-10 NOTE — Patient Instructions (Signed)
Continue dosage of 2.5 mg warfarin every day EXCEPT 3.75 mg on MONDAYS, [place]. Recheck next week since you're still on the doxycycline.

## 2019-11-10 NOTE — Telephone Encounter (Signed)
Rx sent in to patient's pharmacy, will notify patient.

## 2019-11-11 ENCOUNTER — Encounter: Payer: Self-pay | Admitting: Adult Health

## 2019-11-13 ENCOUNTER — Ambulatory Visit (INDEPENDENT_AMBULATORY_CARE_PROVIDER_SITE_OTHER): Payer: 59 | Admitting: Physician Assistant

## 2019-11-13 ENCOUNTER — Encounter: Payer: Self-pay | Admitting: Physician Assistant

## 2019-11-13 ENCOUNTER — Other Ambulatory Visit: Payer: Self-pay

## 2019-11-13 VITALS — Ht 65.0 in | Wt 138.0 lb

## 2019-11-13 DIAGNOSIS — I96 Gangrene, not elsewhere classified: Secondary | ICD-10-CM

## 2019-11-13 NOTE — Progress Notes (Signed)
Office Visit Note   Patient: Briana Andrews           Date of Birth: 1966/04/17           MRN: [id number] Visit Date: 11/13/2019              Requested by: No referring provider defined for this encounter. PCP: Patient, [name]  Chief Complaint  Patient presents with  . Right Foot - Routine Post Op      HPI: This is a pleasant woman who is in follow-up.  She is 1 month status post right dorsal foot debridement and skin graft she has been using a Silvadene dressing daily she has been on antibiotics for a week.  Assessment & Plan: Visit Diagnoses: No diagnosis found.  Plan: She will do Silvadene dressings every other day and on the alternate days just use a dry dressing.  She will follow-up in 2 weeks.  Follow-Up Instructions: No follow-ups on file.   Ortho Exam  Patient is alert, oriented, no adenopathy, well-dressed, normal affect, normal respiratory effort. She does have a lot of fibrous tissue.  She has necrosing of the anterior tendon.  This was debrided by Dr. Lajoyce Corners.  She has some good healthy skin granulation tissue the wound had a lot of maceration to it  Imaging: No results found. No images are attached to the encounter.  Labs: Lab Results  Component Value Date   HGBA1C 5.8 (H) 08/17/2019   ESRSEDRATE 0 08/16/2019   REPTSTATUS 08/19/2019 FINAL 08/18/2019   CULT  08/18/2019    NO GROWTH Performed at [facility], [address], [place], [place] [address]      Lab Results  Component Value Date   ALBUMIN 2.6 (L) 10/15/2019   ALBUMIN 1.2 (L) 09/05/2019   ALBUMIN 1.3 (L) 09/03/2019    Lab Results  Component Value Date   MG 2.1 09/08/2019   MG 1.4 (L) 09/07/2019   MG 1.9 09/02/2019   No results found for: VD25OH  No results found for: PREALBUMIN CBC EXTENDED Latest Ref Rng & Units 10/15/2019 09/29/2019 09/13/2019  WBC 4.0 - 10.5 K/uL 7.4 8.3 9.0  RBC 3.87 - 5.11 MIL/uL 3.38(L) 3.21(L) 2.67(L)  HGB 12.0 - 15.0 g/dL 10.6(L) 10.2(L) 8.4(L)    HCT 36.0 - 46.0 % 32.8(L) 31.4(L) 25.4(L)  PLT 150 - 400 K/uL 535(H) 680(H) 455(H)  NEUTROABS 1.7 - 7.7 K/uL - - -  LYMPHSABS 0.7 - 4.0 K/uL - - -     Body mass index is 22.96 kg/m.  Orders:  No orders of the defined types were placed in this encounter.  No orders of the defined types were placed in this encounter.    Procedures: No procedures performed  Clinical Data: No additional findings.  ROS:  All other systems negative, except as noted in the HPI. Review of Systems  Objective: Vital Signs: Ht 5\' 5"  (1.651 m)   Wt 138 lb (62.6 kg)   BMI 22.96 kg/m   Specialty Comments:  No specialty comments available.  PMFS History: Patient Active Problem List   Diagnosis Date Noted  . Gangrene of right foot (HCC)   . Cutaneous abscess of right foot   . Endotracheally intubated   . Acute on chronic systolic CHF (congestive heart failure) (HCC)   . GI bleed 08/26/2019  . Pressure injury of skin 08/23/2019  . Acute upper GI bleed   . Shock circulatory (HCC)   . Palliative care by specialist   .  Goals of care, counseling/discussion   . Advanced care planning/counseling discussion   . Jaundice   . Cerebral embolism with cerebral infarction 08/17/2019  . PVD (peripheral vascular disease) ([place]) 08/17/2019  . Left ventricular apical thrombus without MI ([place]) 08/17/2019  . Acute CHF (congestive heart failure) ([place]) 08/17/2019  . Abnormal LFTs 08/16/2019  . Acute metabolic encephalopathy 25/95/6387  . SOB (shortness of breath) 08/16/2019  . AKI (acute kidney injury) ([place]) 08/16/2019  . Fall 08/16/2019  . Lactic acidosis 08/16/2019  . Back pain 08/16/2019  . Liver failure without hepatic coma ([place]) 08/16/2019  . Multifocal pneumonia 08/16/2019  . Alcohol abuse   . Tobacco abuse   . Shortness of breath    Past Medical History:  Diagnosis Date  . Alcohol abuse   . CHF (congestive heart failure) ([place])    last EF 35-40%. etoh induced  . Coronary artery disease     80%RCA, 50%LCX, 40%LAD  . Depression   . DVT (deep venous thrombosis) (HCC)    right popliteal vein (08/17/19)  . GERD (gastroesophageal reflux disease)   . Hyperlipidemia   . LV (left ventricular) mural thrombus   . Stroke [facility])    / memory loss  . Tobacco abuse     Family History  Problem Relation Age of Onset  . Heart disease Mother     Past Surgical History:  Procedure Laterality Date  . ABDOMINAL AORTOGRAM W/LOWER EXTREMITY N/A 08/19/2019   Procedure: ABDOMINAL AORTOGRAM W/LOWER EXTREMITY;  Surgeon: Serafina Mitchell, MD;  Location: [place] [facility];  Service: Cardiovascular;  Laterality: N/A;  . ABDOMINAL AORTOGRAM W/LOWER EXTREMITY Bilateral 08/20/2019   Procedure: ABDOMINAL AORTOGRAM W/LOWER EXTREMITY;  Surgeon: Marty Heck, MD;  Location: [place] [facility];  Service: Cardiovascular;  Laterality: Bilateral;  . DEBRIDEMENT  FOOT Right 10/15/2019  . ESOPHAGOGASTRODUODENOSCOPY N/A 08/26/2019   Procedure: ESOPHAGOGASTRODUODENOSCOPY (EGD);  Surgeon: Wilford Corner, MD;  Location: [place];  Service: Endoscopy;  Laterality: N/A;  . HOT HEMOSTASIS N/A 08/26/2019   Procedure: HOT HEMOSTASIS (ARGON PLASMA COAGULATION/BICAP);  Surgeon: Wilford Corner, MD;  Location: [place];  Service: Endoscopy;  Laterality: N/A;  . I & D EXTREMITY Right 10/15/2019   Procedure: RIGHT FOOT DEBRIDEMENT;  Surgeon: Newt Minion, MD;  Location: [place];  Service: Orthopedics;  Laterality: Right;  . PERIPHERAL VASCULAR BALLOON ANGIOPLASTY  08/19/2019   Procedure: PERIPHERAL VASCULAR BALLOON ANGIOPLASTY;  Surgeon: Serafina Mitchell, MD;  Location: [place] [facility];  Service: Cardiovascular;;  Right femoral popliteal and PT   . PERIPHERAL VASCULAR INTERVENTION Left 08/20/2019   Procedure: PERIPHERAL VASCULAR INTERVENTION;  Surgeon: Marty Heck, MD;  Location: [place] [facility];  Service: Cardiovascular;  Laterality: Left;  . RIGHT/LEFT HEART CATH AND CORONARY ANGIOGRAPHY N/A  09/02/2019   Procedure: RIGHT/LEFT HEART CATH AND CORONARY ANGIOGRAPHY;  Surgeon: Larey Dresser, MD;  Location: [place] [facility];  Service: Cardiovascular;  Laterality: N/A;  . SCLEROTHERAPY  08/26/2019   Procedure: SCLEROTHERAPY;  Surgeon: Wilford Corner, MD;  Location: [facility] ENDOSCOPY;  Service: Endoscopy;;   Social History   Occupational History  . Not on file  Tobacco Use  . Smoking status: Former Smoker    Years: 39.00    Quit date: 06/2019    Years since quitting: 0.3  . Smokeless tobacco: Never Used  Substance and Sexual Activity  . Alcohol use: Not Currently    Comment: alcoholism - last drink in august 2020  . Drug use: Not Currently  Types: Marijuana    Comment: none since August 2020  . Sexual activity: Not on file

## 2019-11-17 ENCOUNTER — Telehealth: Payer: Self-pay | Admitting: Orthopedic Surgery

## 2019-11-17 ENCOUNTER — Ambulatory Visit (INDEPENDENT_AMBULATORY_CARE_PROVIDER_SITE_OTHER): Payer: 59

## 2019-11-17 ENCOUNTER — Other Ambulatory Visit: Payer: Self-pay | Admitting: Physician Assistant

## 2019-11-17 ENCOUNTER — Other Ambulatory Visit: Payer: Self-pay

## 2019-11-17 DIAGNOSIS — Z5181 Encounter for therapeutic drug level monitoring: Secondary | ICD-10-CM

## 2019-11-17 DIAGNOSIS — I240 Acute coronary thrombosis not resulting in myocardial infarction: Secondary | ICD-10-CM | POA: Diagnosis not present

## 2019-11-17 LAB — POCT INR: INR: 1.5 — AB (ref 2.0–3.0)

## 2019-11-17 MED ORDER — TRAMADOL HCL 50 MG PO TABS: 50.0000 mg | ORAL_TABLET | Freq: Four times a day (QID) | ORAL | 0 refills | Status: DC | PRN

## 2019-11-17 NOTE — Telephone Encounter (Signed)
Tramadol Called In

## 2019-11-17 NOTE — Telephone Encounter (Signed)
Please advise thank you

## 2019-11-17 NOTE — Patient Instructions (Signed)
-   START NEW DOSAGE of 1.5 tablets EVERY DAY - Recheck in 3 weeks.

## 2019-11-17 NOTE — Telephone Encounter (Signed)
Patient called. She is requesting a refill on her traMADol.  Call back number: [phone]

## 2019-11-25 ENCOUNTER — Other Ambulatory Visit: Payer: Self-pay

## 2019-11-25 ENCOUNTER — Encounter (HOSPITAL_COMMUNITY): Payer: Self-pay | Admitting: Cardiology

## 2019-11-25 ENCOUNTER — Ambulatory Visit (HOSPITAL_COMMUNITY)
Admission: RE | Admit: 2019-11-25 | Discharge: 2019-11-25 | Disposition: A | Discharge status: Home / Self Care | Payer: 59 | Source: Ambulatory Visit | Attending: Cardiology | Admitting: Cardiology

## 2019-11-25 ENCOUNTER — Ambulatory Visit: Payer: 59 | Admitting: Cardiology

## 2019-11-25 VITALS — BP 140/82 | HR 89 | Wt 145.6 lb

## 2019-11-25 DIAGNOSIS — Z87891 Personal history of nicotine dependence: Secondary | ICD-10-CM | POA: Insufficient documentation

## 2019-11-25 DIAGNOSIS — Z7901 Long term (current) use of anticoagulants: Secondary | ICD-10-CM | POA: Insufficient documentation

## 2019-11-25 DIAGNOSIS — Z8249 Family history of ischemic heart disease and other diseases of the circulatory system: Secondary | ICD-10-CM | POA: Insufficient documentation

## 2019-11-25 DIAGNOSIS — K759 Inflammatory liver disease, unspecified: Secondary | ICD-10-CM

## 2019-11-25 DIAGNOSIS — Z79899 Other long term (current) drug therapy: Secondary | ICD-10-CM | POA: Diagnosis not present

## 2019-11-25 DIAGNOSIS — Z8673 Personal history of transient ischemic attack (TIA), and cerebral infarction without residual deficits: Secondary | ICD-10-CM | POA: Insufficient documentation

## 2019-11-25 DIAGNOSIS — F1011 Alcohol abuse, in remission: Secondary | ICD-10-CM

## 2019-11-25 DIAGNOSIS — K922 Gastrointestinal hemorrhage, unspecified: Secondary | ICD-10-CM | POA: Diagnosis not present

## 2019-11-25 DIAGNOSIS — I2510 Atherosclerotic heart disease of native coronary artery without angina pectoris: Secondary | ICD-10-CM | POA: Diagnosis not present

## 2019-11-25 DIAGNOSIS — Z8711 Personal history of peptic ulcer disease: Secondary | ICD-10-CM | POA: Diagnosis not present

## 2019-11-25 DIAGNOSIS — I428 Other cardiomyopathies: Secondary | ICD-10-CM | POA: Insufficient documentation

## 2019-11-25 DIAGNOSIS — K7460 Unspecified cirrhosis of liver: Secondary | ICD-10-CM | POA: Diagnosis not present

## 2019-11-25 DIAGNOSIS — Z86718 Personal history of other venous thrombosis and embolism: Secondary | ICD-10-CM | POA: Insufficient documentation

## 2019-11-25 DIAGNOSIS — I739 Peripheral vascular disease, unspecified: Secondary | ICD-10-CM | POA: Diagnosis not present

## 2019-11-25 DIAGNOSIS — I5022 Chronic systolic (congestive) heart failure: Secondary | ICD-10-CM | POA: Diagnosis not present

## 2019-11-25 DIAGNOSIS — F1010 Alcohol abuse, uncomplicated: Secondary | ICD-10-CM | POA: Diagnosis not present

## 2019-11-25 LAB — CBC
HCT: 34.9 % — ABNORMAL LOW (ref 36.0–46.0)
Hemoglobin: 11.2 g/dL — ABNORMAL LOW (ref 12.0–15.0)
MCH: 30.9 pg (ref 26.0–34.0)
MCHC: 32.1 g/dL (ref 30.0–36.0)
MCV: 96.4 fL (ref 80.0–100.0)
Platelets: 449 10*3/uL — ABNORMAL HIGH (ref 150–400)
RBC: 3.62 MIL/uL — ABNORMAL LOW (ref 3.87–5.11)
RDW: 13.5 % (ref 11.5–15.5)
WBC: 8.7 10*3/uL (ref 4.0–10.5)
nRBC: 0 % (ref 0.0–0.2)

## 2019-11-25 LAB — LIPID PANEL
Cholesterol: 135 mg/dL (ref 0–200)
HDL: 28 mg/dL — ABNORMAL LOW (ref >40)
LDL Cholesterol: 55 mg/dL (ref 0–99)
Total CHOL/HDL Ratio: 4.8 RATIO
Triglycerides: 259 mg/dL — ABNORMAL HIGH (ref <150)
VLDL: 52 mg/dL — ABNORMAL HIGH (ref 0–40)

## 2019-11-25 LAB — COMPREHENSIVE METABOLIC PANEL
ALT: 12 U/L (ref 0–44)
AST: 18 U/L (ref 15–41)
Albumin: 3.1 g/dL — ABNORMAL LOW (ref 3.5–5.0)
Alkaline Phosphatase: 78 U/L (ref 38–126)
Anion gap: 11 (ref 5–15)
BUN: 20 mg/dL (ref 6–20)
CO2: 26 mmol/L (ref 22–32)
Calcium: 9.8 mg/dL (ref 8.9–10.3)
Chloride: 97 mmol/L — ABNORMAL LOW (ref 98–111)
Creatinine, Ser: 1.05 mg/dL — ABNORMAL HIGH (ref 0.44–1.00)
GFR calc Af Amer: >60 mL/min (ref >60)
GFR calc non Af Amer: >60 mL/min (ref >60)
Glucose, Bld: 137 mg/dL — ABNORMAL HIGH (ref 70–99)
Potassium: 3.6 mmol/L (ref 3.5–5.1)
Sodium: 134 mmol/L — ABNORMAL LOW (ref 135–145)
Total Bilirubin: 0.6 mg/dL (ref 0.3–1.2)
Total Protein: 8.1 g/dL (ref 6.5–8.1)

## 2019-11-25 LAB — DIGOXIN LEVEL: Digoxin Level: 0.2 ng/mL — ABNORMAL LOW (ref 0.8–2.0)

## 2019-11-25 MED ORDER — SACUBITRIL-VALSARTAN 24-26 MG PO TABS: 1.0000 | ORAL_TABLET | Freq: Two times a day (BID) | ORAL | 5 refills | Status: DC

## 2019-11-25 MED ORDER — DIGOXIN 125 MCG PO TABS: 0.1250 mg | ORAL_TABLET | ORAL | 5 refills | Status: DC

## 2019-11-25 NOTE — Patient Instructions (Signed)
START Entresto 24/26mg  (1 tab) twice a day  PLEASE CALL OUR OFFICE TO REVIEW HOME MEDICATIONS [phone] OPT 2  You have been referred to [facility] for evaluation of Gastrointestinal bleeding.  Labs today and recheck in 2 weeks We will only contact you if something comes back abnormal or we need to make some changes. Otherwise no news is good news!   Your physician recommends that you schedule a follow-up appointment in: Clinical Pharmacist in 3 weeks for medication adjustments. You will see pharmacist for 2-3 visits.  Your physician recommends that you schedule a follow-up appointment in: 2 months with Dr Aundra Dubin with an echo  Your physician has requested that you have an echocardiogram. Echocardiography is a painless test that uses sound waves to create images of your heart. It provides your doctor with information about the size and shape of your heart and how well your heart's chambers and valves are working. This procedure takes approximately one hour. There are no restrictions for this procedure.

## 2019-11-25 NOTE — Progress Notes (Signed)
PCP: None  Cardiologist: Dr Shirlee Latch Vascular : Dr Darrick Penna  Ortho: Dr Lajoyce Corners  [facility]   HPI: Mrs Kalish is a 53 y.o. alcoholic woman with a history of R frontal CVA, B popliteal artery occlusions, hepatic failure, GI bleeding, cirrhosis, ETOH abuse, and chronic systolic heart failure.  Admitted 08/15/19 with increased sob and confusion. Treated for PNA and MRI brain showed R ACA stroke. Hospital course complicated new onset acute systolic heart failure possibly due to ETOH abuse. Hospital course complicated by LV thrombus, hepatitis, ARF, lower extremity cardioembolism, and shock.  She had suspected cardioembolism to bilateral popliteal arteries and underwent mechanical thrombectomy.  Received PRBCs and was able to undergo EGD 9/29 that showed duodenal and gastric ulcers which were treated endoscopically. Continued on PPI. Heparin has been converted to coumadin. R foot wound was to be followed by Vascular in the community  Discharged to SNF. On 09/21/19 she presented to the [facility] after a fall that resulted in head laceration. CT scan was unremarkable. She left SNF on 09/27/19 because she was not taken to any of her follow up appointments.    She has had follow up with VVS/Ortho for R foot wound and had debridement 10/14/19.   Today she returns for followup of CHF.  She is doing some walking with a walker but told not to walk too much because of foot wound.  She uses a wheelchair outside the house.  No significant dyspnea though not very active.  No chest pain.  No orthopnea/PND. No lightheadedness.   ECG (personally reviewed): NSR, nonspecific anterolateral TWIs.   Labs (11/20): K 4.2, creatinine 0.97  1. Chronic systolic CHF: Primarily nonischemic cardiomyopathy.   - Echo (9/20): EF 15-20% with LV apical thrombus. Severely decreased RV function.  - RHC (10/20): RA 6, CI 3.7, PVR 3.7  - LHC (10/20): LAD 40%, Left Circumflex 50% , 80% proximal RCA, 60% mid PDA stenosis 2. GI Bleed:  Gastric and duodenal ulcers in 9/20.  3. R frontal CVA: In 9/20, due to LV thrombus.  4. 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   - ABIs (11/20): Normal.  5. 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  Liver imaging did not show cirrhosis or portal vein thrombosis. RV failure/congestive hepatopathy may have played a role.  6. ETOH Abuse  7. LV thrombus 8. CAD: LHC 10/20 LAD 40%, Left Circumflex 50% , RCA 80% Proximal RCA 60% mid PDA stenosis.  9. DVT: Right popliteal vein in 9/20.   ROS: All systems negative except as listed in HPI, PMH and Problem List.  SH:  Social History   Socioeconomic History  . Marital status: Married    Spouse name: Not on file  . Number of children: Not on file  . Years of education: Not on file  . Highest education level: Not on file  Occupational History  . Not on file  Tobacco Use  . Smoking status: Former Smoker    Years: 39.00    Quit date: 06/2019    Years since quitting: 0.4  . Smokeless tobacco: Never Used  Substance and Sexual Activity  . Alcohol use: Not Currently    Comment: alcoholism - last drink in august 2020  . Drug use: Not Currently    Types: Marijuana    Comment: none since August 2020  . Sexual activity: Not on file  Other Topics Concern  . Not on file  Social History Narrative  . Not on file   Social Determinants  of Health   Financial Resource Strain:   . Difficulty of Paying Living Expenses: Not on file  Food Insecurity:   . Worried About Programme researcher, broadcasting/film/video in the Last Year: Not on file  . Ran Out of Food in the Last Year: Not on file  Transportation Needs:   . Lack of Transportation (Medical): Not on file  . Lack of Transportation (Non-Medical): Not on file  Physical Activity:   . Days of Exercise per Week: Not on file  . Minutes of Exercise per Session: Not on file  Stress:   . Feeling of Stress : Not on file  Social Connections:   . Frequency of Communication with  Friends and Family: Not on file  . Frequency of Social Gatherings with Friends and Family: Not on file  . Attends Religious Services: Not on file  . Active Member of Clubs or Organizations: Not on file  . Attends Banker Meetings: Not on file  . Marital Status: Not on file  Intimate Partner Violence:   . Fear of Current or Ex-Partner: Not on file  . Emotionally Abused: Not on file  . Physically Abused: Not on file  . Sexually Abused: Not on file    FH:  Family History  Problem Relation Age of Onset  . Heart disease Mother     Current Outpatient Medications  Medication Sig Dispense Refill  . digoxin (LANOXIN) 0.125 MG tablet Take 1 tablet (0.125 mg total) by mouth every other day. 15 tablet 5  . doxycycline (VIBRA-TABS) 100 MG tablet Take 100 mg by mouth daily.    . furosemide (LASIX) 20 MG tablet Take 20 mg by mouth daily.    Marland Kitchen ibuprofen (ADVIL) 200 MG tablet Take 200 mg by mouth every 6 (six) hours as needed for headache or moderate pain.    . metoprolol succinate (TOPROL-XL) 25 MG 24 hr tablet Take 25 mg by mouth daily.    . pantoprazole (PROTONIX) 40 MG tablet Take 40 mg by mouth daily.    . rosuvastatin (CRESTOR) 5 MG tablet Take 5 mg by mouth daily.    . silver sulfADIAZINE (SILVADENE) 1 % cream Apply 1 application topically every other day.    . traMADol (ULTRAM) 50 MG tablet Take 1 tablet (50 mg total) by mouth every 6 (six) hours as needed for moderate pain. 30 tablet 0  . warfarin (COUMADIN) 2.5 MG tablet Take 3.75 on Monday and Friday Take 2.5 mg all the the other days    . sacubitril-valsartan (ENTRESTO) 24-26 MG Take 1 tablet by mouth 2 (two) times daily. 60 tablet 5   No current facility-administered medications for this encounter.    Vitals:   11/25/19 1114  BP: 140/82  Pulse: 89  SpO2: 99%  Weight: 66 kg (145 lb 9.6 oz)   Wt Readings from Last 3 Encounters:  11/25/19 66 kg (145 lb 9.6 oz)  11/13/19 62.6 kg (138 lb)  11/05/19 62.6 kg (138 lb)      PHYSICAL EXAM: General: NAD Neck: No JVD, no thyromegaly or thyroid nodule.  Lungs: Clear to auscultation bilaterally with normal respiratory effort. CV: Nondisplaced PMI.  Heart regular S1/S2, no S3/S4, no murmur.  No peripheral edema.  No carotid bruit.  Normal pedal pulses.  Abdomen: Soft, nontender, no hepatosplenomegaly, no distention.  Skin: Intact without lesions or rashes.  Neurologic: Alert and oriented x 3.  Psych: Normal affect. Extremities: No clubbing or cyanosis. Feet wrapped.  HEENT: Normal.   ASSESSMENT &  PLAN: 1. Chronic systolic CHF:  Cardiogenic shock in 9/20, echo 07/2019 with EF 15-20%, mild LV dilation, LV thrombus, mild-moderate MR, severely decreased RV systolic function.  LHC/RHC done 10/20 showing normal filling pressures and preserved cardiac output.  There was diffuse coronary disease, especially involving distal and branch vessels, only possible interventional target was 80% proximal RCA stenosis.  No intervention given good flow down the vessel and unlikely to significantly improve LV function.  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  Probably NYHA class II but not very active due to right foot wound. She does not look volume overloaded on exam.  - Continue digoxin 0.125 every other day. Check digoxin level.   - Continue Toprol XL 25 mg at bed time.  - Start Entresto 24/26 bid, BMET today and again in 10 days.  - Repeat echo in 2 months.  2. Suspected ETOH hepatitis: Elevated LFTs during 9/20 admission.  However liver imaging with [place] and CT has NOT shown cirrhosis or portal/hepatic vein thrombosis. Question has arisen whether this might possibly be due primarily to RV failure.  - Needs followup with GI, will try to arrange.  3.  LV thrombus: Possible cause of embolic event to popliteal arteries as well as subacute CVA.  - Continue warfarin.   4. PAD: Bilateral popliteal artery occlusions,  suspect cardioembolic from LV thrombus. Now s/p mechanical thrombectomy by vascular. Right foot wound followed by Dr. Sharol Given, recently debrided.   5. CVA: Subacute right ACA CVA in 9/20. Likely from LV thrombus.  - Continue anticoagulation.  6. Right popliteal vein DVT: 9/20.  - Continue warfarin.   7. H/O GI bleeding: Upper GI bleeding, EGD with bleeding duodenal and gastric ulcer in 9/20 that were treated, no varices.  - Continue Protonix - Followup with GI.  8. H/O ETOH abuse: She has quit drinking now since August.  Hopefully this will help LV function.   9. CAD: Extensive distal and branch vessel coronary disease on cath 10/20.  Only possible interventional target was 80% proximal RCA stenosis.  owever, there was good flow down the RCA and I do not think intervention on this lesion would appreciably improve her LV function.  No chest pain.  - Continue  Crestor 5 mg daily.  Check lipids today.  - No ASA given warfarin use.   Loralie Champagne 11/25/2019

## 2019-11-26 ENCOUNTER — Telehealth (HOSPITAL_COMMUNITY): Payer: Self-pay

## 2019-11-26 MED ORDER — ICOSAPENT ETHYL 1 G PO CAPS: 2.0000 g | ORAL_CAPSULE | Freq: Two times a day (BID) | ORAL | 5 refills | Status: DC

## 2019-11-26 NOTE — Telephone Encounter (Signed)
-----   Message from Larey Dresser, MD sent at 11/25/2019  9:51 PM EST ----- Elevated triglycerides, would start vascepa 2 g bid. Lipids in 2 months.

## 2019-11-27 ENCOUNTER — Encounter: Payer: Self-pay | Admitting: Physician Assistant

## 2019-11-27 ENCOUNTER — Other Ambulatory Visit: Payer: Self-pay

## 2019-11-27 ENCOUNTER — Ambulatory Visit (INDEPENDENT_AMBULATORY_CARE_PROVIDER_SITE_OTHER): Payer: 59 | Admitting: Physician Assistant

## 2019-11-27 DIAGNOSIS — I739 Peripheral vascular disease, unspecified: Secondary | ICD-10-CM

## 2019-11-27 NOTE — Progress Notes (Signed)
Office Visit Note   Patient: Briana Andrews           Date of Birth: Apr 06, 1966           MRN: [id number] Visit Date: 11/27/2019              Requested by: No referring provider defined for this encounter. PCP: Patient, [name]  Chief Complaint  Patient presents with  . Right Foot - Pain      HPI: This is a pleasant woman who is 6 weeks status post debridement and skin grafting of the dorsum of her foot.  She has been doing dressing changes with Silvadene.  Assessment & Plan: Visit Diagnoses: No diagnosis found.  Plan: I will switch her to a Vive compression sock.  She understands that if she has drainage she should wrap something over the sock and not under it she will follow-up in 2 weeks  Follow-Up Instructions: No follow-ups on file.   Ortho Exam  Patient is alert, oriented, no adenopathy, well-dressed, normal affect, normal respiratory effort. Focused examination of her foot demonstrates good granulation tissue very small amount of fibrous tissue no surrounding cellulitis granulation tissue is now even with the skin no foul odor  Imaging: No results found. No images are attached to the encounter.  Labs: Lab Results  Component Value Date   HGBA1C 5.8 (H) 08/17/2019   ESRSEDRATE 0 08/16/2019   REPTSTATUS 08/19/2019 FINAL 08/18/2019   CULT  08/18/2019    NO GROWTH Performed at [facility], [place] [address] [place], [place] [address]      Lab Results  Component Value Date   ALBUMIN 3.1 (L) 11/25/2019   ALBUMIN 2.6 (L) 10/15/2019   ALBUMIN 1.2 (L) 09/05/2019    Lab Results  Component Value Date   MG 2.1 09/08/2019   MG 1.4 (L) 09/07/2019   MG 1.9 09/02/2019   No results found for: VD25OH  No results found for: PREALBUMIN CBC EXTENDED Latest Ref Rng & Units 11/25/2019 10/15/2019 09/29/2019  WBC 4.0 - 10.5 K/uL 8.7 7.4 8.3  RBC 3.87 - 5.11 MIL/uL 3.62(L) 3.38(L) 3.21(L)  HGB 12.0 - 15.0 g/dL 11.2(L) 10.6(L) 10.2(L)  HCT 36.0 - 46.0 %  34.9(L) 32.8(L) 31.4(L)  PLT 150 - 400 K/uL 449(H) 535(H) 680(H)  NEUTROABS 1.7 - 7.7 K/uL - - -  LYMPHSABS 0.7 - 4.0 K/uL - - -     There is no height or weight on file to calculate BMI.  Orders:  No orders of the defined types were placed in this encounter.  No orders of the defined types were placed in this encounter.    Procedures: No procedures performed  Clinical Data: No additional findings.  ROS:  All other systems negative, except as noted in the HPI. Review of Systems  Objective: Vital Signs: There were no vitals taken for this visit.  Specialty Comments:  No specialty comments available.  PMFS History: Patient Active Problem List   Diagnosis Date Noted  . Gangrene of right foot ([place])   . Cutaneous abscess of right foot   . Endotracheally intubated   . Acute on chronic systolic CHF (congestive heart failure) ([place])   . GI bleed 08/26/2019  . Pressure injury of skin 08/23/2019  . Acute upper GI bleed   . Shock circulatory ([place])   . Palliative care by specialist   . Goals of care, counseling/discussion   . Advanced care planning/counseling discussion   . Jaundice   .  Cerebral embolism with cerebral infarction 08/17/2019  . PVD (peripheral vascular disease) (HCC) 08/17/2019  . 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 Acute CHF (congestive heart failure) (HCC) 08/17/2019  . Abnormal LFTs 08/16/2019  . Acute metabolic encephalopathy 08/16/2019  . SOB (shortness of breath) 08/16/2019  . AKI (acute kidney injury) (HCC) 08/16/2019  . Fall 08/16/2019  . Lactic acidosis 08/16/2019  . Back pain 08/16/2019  . Liver failure without hepatic coma (HCC) 08/16/2019  . Multifocal pneumonia 08/16/2019  . Alcohol abuse   . Tobacco abuse   . Shortness of breath    Past Medical History:  Diagnosis Date  . Alcohol abuse   . CHF (congestive heart failure) (HCC)    last EF 35-40%. etoh induced  . Coronary artery disease    80%RCA, 50%LCX,  40%LAD  . Depression   . DVT (deep venous thrombosis) (HCC)    right popliteal vein (08/17/19)  . GERD (gastroesophageal reflux disease)   . Hyperlipidemia   . LV (left ventricular) mural thrombus   . Stroke [facility])    / memory loss  . Tobacco abuse     Family History  Problem Relation Age of Onset  . Heart disease Mother     Past Surgical History:  Procedure Laterality Date  . ABDOMINAL AORTOGRAM W/LOWER EXTREMITY N/A 08/19/2019   Procedure: ABDOMINAL AORTOGRAM W/LOWER EXTREMITY;  Surgeon: Nada Libman, MD;  Location: [facility] INVASIVE CV LAB;  Service: Cardiovascular;  Laterality: N/A;  . ABDOMINAL AORTOGRAM W/LOWER EXTREMITY Bilateral 08/20/2019   Procedure: ABDOMINAL AORTOGRAM W/LOWER EXTREMITY;  Surgeon: Cephus Shelling, MD;  Location: [facility] INVASIVE CV LAB;  Service: Cardiovascular;  Laterality: Bilateral;  . DEBRIDEMENT  FOOT Right 10/15/2019  . ESOPHAGOGASTRODUODENOSCOPY N/A 08/26/2019   Procedure: ESOPHAGOGASTRODUODENOSCOPY (EGD);  Surgeon: Charlott Rakes, MD;  Location: [facility] ENDOSCOPY;  Service: Endoscopy;  Laterality: N/A;  . HOT HEMOSTASIS N/A 08/26/2019   Procedure: HOT HEMOSTASIS (ARGON PLASMA COAGULATION/BICAP);  Surgeon: Charlott Rakes, MD;  Location: [facility] ENDOSCOPY;  Service: Endoscopy;  Laterality: N/A;  . I & D EXTREMITY Right 10/15/2019   Procedure: RIGHT FOOT DEBRIDEMENT;  Surgeon: Nadara Mustard, MD;  Location[facility] OR;  Service: Orthopedics;  Laterality: Right;  . PERIPHERAL VASCULAR BALLOON ANGIOPLASTY  08/19/2019   Procedure: PERIPHERAL VASCULAR BALLOON ANGIOPLASTY;  Surgeon: Nada Libman, MD;  Location: [facility] INVASIVE CV LAB;  Service: Cardiovascular;;  Right femoral popliteal and PT   . PERIPHERAL VASCULAR INTERVENTION Left 08/20/2019   Procedure: PERIPHERAL VASCULAR INTERVENTION;  Surgeon: Cephus Shelling, MD;  Location: [facility] INVASIVE CV LAB;  Service: Cardiovascular;  Laterality: Left;  . RIGHT/LEFT HEART CATH AND CORONARY ANGIOGRAPHY N/A 09/02/2019    Procedure: RIGHT/LEFT HEART CATH AND CORONARY ANGIOGRAPHY;  Surgeon: Laurey Morale, MD;  Location: [facility] INVASIVE CV LAB;  Service: Cardiovascular;  Laterality: N/A;  . SCLEROTHERAPY  08/26/2019   Procedure: SCLEROTHERAPY;  Surgeon: Charlott Rakes, MD;  Location: [facility] ENDOSCOPY;  Service: Endoscopy;;   Social History   Occupational History  . Not on file  Tobacco Use  . Smoking status: Former Smoker    Years: 39.00    Quit date: 06/2019    Years since quitting: 0.4  . Smokeless tobacco: Never Used  Substance and Sexual Activity  . Alcohol use: Not Currently    Comment: alcoholism - last drink in august 2020  . Drug use: Not Currently    Types: Marijuana    Comment: none since August 2020  . Sexual activity: Not on file

## 2019-12-03 ENCOUNTER — Other Ambulatory Visit: Payer: Self-pay | Admitting: Physician Assistant

## 2019-12-03 ENCOUNTER — Telehealth: Payer: Self-pay | Admitting: Orthopedic Surgery

## 2019-12-03 MED ORDER — TRAMADOL HCL 50 MG PO TABS: 50.0000 mg | ORAL_TABLET | Freq: Four times a day (QID) | ORAL | 0 refills | Status: DC | PRN

## 2019-12-03 NOTE — Telephone Encounter (Signed)
I refilled tramadol.

## 2019-12-03 NOTE — Telephone Encounter (Signed)
Pt is s/p a right foot debridement 10/15/2019  pt is requesting a refill on pain medication had # 60 Tramadol since 11/03/19 and #30 Hydrocodone 10/29/19 please advise.

## 2019-12-03 NOTE — Telephone Encounter (Signed)
Patient called. She is requesting a refill on her pain meds.   Call back number: [phone]

## 2019-12-05 ENCOUNTER — Ambulatory Visit (HOSPITAL_COMMUNITY)
Admission: RE | Admit: 2019-12-05 | Discharge: 2019-12-05 | Disposition: A | Discharge status: Home / Self Care | Payer: 59 | Source: Ambulatory Visit | Attending: Cardiology | Admitting: Cardiology

## 2019-12-05 ENCOUNTER — Other Ambulatory Visit: Payer: Self-pay

## 2019-12-05 DIAGNOSIS — I5022 Chronic systolic (congestive) heart failure: Secondary | ICD-10-CM | POA: Insufficient documentation

## 2019-12-05 LAB — BASIC METABOLIC PANEL
Anion gap: 9 (ref 5–15)
BUN: 23 mg/dL — ABNORMAL HIGH (ref 6–20)
CO2: 26 mmol/L (ref 22–32)
Calcium: 9.8 mg/dL (ref 8.9–10.3)
Chloride: 101 mmol/L (ref 98–111)
Creatinine, Ser: 1.23 mg/dL — ABNORMAL HIGH (ref 0.44–1.00)
GFR calc Af Amer: 58 mL/min — ABNORMAL LOW (ref >60)
GFR calc non Af Amer: 50 mL/min — ABNORMAL LOW (ref >60)
Glucose, Bld: 135 mg/dL — ABNORMAL HIGH (ref 70–99)
Potassium: 3.9 mmol/L (ref 3.5–5.1)
Sodium: 136 mmol/L (ref 135–145)

## 2019-12-08 ENCOUNTER — Other Ambulatory Visit: Payer: Self-pay

## 2019-12-08 ENCOUNTER — Ambulatory Visit (INDEPENDENT_AMBULATORY_CARE_PROVIDER_SITE_OTHER): Payer: Self-pay

## 2019-12-08 DIAGNOSIS — Z5181 Encounter for therapeutic drug level monitoring: Secondary | ICD-10-CM

## 2019-12-08 DIAGNOSIS — I240 Acute coronary thrombosis not resulting in myocardial infarction: Secondary | ICD-10-CM

## 2019-12-08 LAB — POCT INR: INR: 1.3 — AB (ref 2.0–3.0)

## 2019-12-08 NOTE — Patient Instructions (Signed)
-   START NEW DOSAGE of 1.5 tablets EVERY DAY EXCEPT 2 TABLETS ON MONDAYS, WEDNESDAYS & FRIDAYS - Recheck in 2 weeks.

## 2019-12-11 ENCOUNTER — Encounter: Payer: Self-pay | Admitting: Physician Assistant

## 2019-12-11 ENCOUNTER — Telehealth (HOSPITAL_COMMUNITY): Payer: Self-pay

## 2019-12-11 ENCOUNTER — Ambulatory Visit (INDEPENDENT_AMBULATORY_CARE_PROVIDER_SITE_OTHER): Payer: 59 | Admitting: Physician Assistant

## 2019-12-11 ENCOUNTER — Other Ambulatory Visit: Payer: Self-pay

## 2019-12-11 VITALS — Ht 65.0 in | Wt 145.0 lb

## 2019-12-11 DIAGNOSIS — I739 Peripheral vascular disease, unspecified: Secondary | ICD-10-CM

## 2019-12-11 NOTE — Telephone Encounter (Signed)
Pt was referred to GI outpatient however pt was already consulted here and needs to be referred to GI at [facility]. Referral and supporting documents faxed [facility] to [phone].

## 2019-12-11 NOTE — Progress Notes (Signed)
Office Visit Note   Patient: Briana Andrews           Date of Birth: 02-Apr-1966           MRN: [id number] Visit Date: 12/11/2019              Requested by: No referring provider defined for this encounter. PCP: Patient, [name]  Chief Complaint  Patient presents with  . Right Foot - Routine Post Op    10/15/19 right foot debridement       HPI: The patient presents 2 months status post right foot debridement she has been good about not of any weight on her foot she did obtain VIVE compression socks. She is using silvadene every other day  Assessment & Plan: Visit Diagnoses: No diagnosis found.  Plan: She should change the dressing 2x daily  And dry dressing only. Follow up 2 weeks at which time she can hopefully transition to her socks Follow-Up Instructions: No follow-ups on file.   Ortho Exam  Patient is alert, oriented, no adenopathy, well-dressed, normal affect, normal respiratory effort. Rfoot dorsal wound, abundance of granulation tissue but wound is wet. No foul odor, bloody serous drainage Imaging: No results found. No images are attached to the encounter.  Labs: Lab Results  Component Value Date   HGBA1C 5.8 (H) 08/17/2019   ESRSEDRATE 0 08/16/2019   REPTSTATUS 08/19/2019 FINAL 08/18/2019   CULT  08/18/2019    NO GROWTH Performed at [facility], [address] [place], [place] [address]      Lab Results  Component Value Date   ALBUMIN 3.1 (L) 11/25/2019   ALBUMIN 2.6 (L) 10/15/2019   ALBUMIN 1.2 (L) 09/05/2019    Lab Results  Component Value Date   MG 2.1 09/08/2019   MG 1.4 (L) 09/07/2019   MG 1.9 09/02/2019   No results found for: VD25OH  No results found for: PREALBUMIN CBC EXTENDED Latest Ref Rng & Units 11/25/2019 10/15/2019 09/29/2019  WBC 4.0 - 10.5 K/uL 8.7 7.4 8.3  RBC 3.87 - 5.11 MIL/uL 3.62(L) 3.38(L) 3.21(L)  HGB 12.0 - 15.0 g/dL 11.2(L) 10.6(L) 10.2(L)  HCT 36.0 - 46.0 % 34.9(L) 32.8(L) 31.4(L)  PLT 150 - 400 K/uL  449(H) 535(H) 680(H)  NEUTROABS 1.7 - 7.7 K/uL - - -  LYMPHSABS 0.7 - 4.0 K/uL - - -     Body mass index is 24.13 kg/m.  Orders:  No orders of the defined types were placed in this encounter.  No orders of the defined types were placed in this encounter.    Procedures: No procedures performed  Clinical Data: No additional findings.  ROS:  All other systems negative, except as noted in the HPI. Review of Systems  Objective: Vital Signs: Ht 5\' 5"  (1.651 m)   Wt 145 lb (65.8 kg)   BMI 24.13 kg/m   Specialty Comments:  No specialty comments available.  PMFS History: Patient Active Problem List   Diagnosis Date Noted  . Gangrene of right foot (HCC)   . Cutaneous abscess of right foot   . Endotracheally intubated   . Acute on chronic systolic CHF (congestive heart failure) (HCC)   . GI bleed 08/26/2019  . Pressure injury of skin 08/23/2019  . Acute upper GI bleed   . Shock circulatory (HCC)   . Palliative care by specialist   . Goals of care, counseling/discussion   . Advanced care planning/counseling discussion   . Jaundice   . Cerebral embolism  with cerebral infarction 08/17/2019  . PVD (peripheral vascular disease) ([place]) 08/17/2019  . Left ventricular apical thrombus without MI ([place]) 08/17/2019  . Acute CHF (congestive heart failure) ([place]) 08/17/2019  . Abnormal LFTs 08/16/2019  . Acute metabolic encephalopathy 07/37/1062  . SOB (shortness of breath) 08/16/2019  . AKI (acute kidney injury) ([place]) 08/16/2019  . Fall 08/16/2019  . Lactic acidosis 08/16/2019  . Back pain 08/16/2019  . Liver failure without hepatic coma ([place]) 08/16/2019  . Multifocal pneumonia 08/16/2019  . Alcohol abuse   . Tobacco abuse   . Shortness of breath    Past Medical History:  Diagnosis Date  . Alcohol abuse   . CHF (congestive heart failure) ([place])    last EF 35-40%. etoh induced  . Coronary artery disease    80%RCA, 50%LCX, 40%LAD  . Depression   . DVT (deep venous  thrombosis) (HCC)    right popliteal vein (08/17/19)  . GERD (gastroesophageal reflux disease)   . Hyperlipidemia   . LV (left ventricular) mural thrombus   . Stroke [facility])    / memory loss  . Tobacco abuse     Family History  Problem Relation Age of Onset  . Heart disease Mother     Past Surgical History:  Procedure Laterality Date  . ABDOMINAL AORTOGRAM W/LOWER EXTREMITY N/A 08/19/2019   Procedure: ABDOMINAL AORTOGRAM W/LOWER EXTREMITY;  Surgeon: Serafina Mitchell, MD;  Location: [place] CV LAB;  Service: Cardiovascular;  Laterality: N/A;  . ABDOMINAL AORTOGRAM W/LOWER EXTREMITY Bilateral 08/20/2019   Procedure: ABDOMINAL AORTOGRAM W/LOWER EXTREMITY;  Surgeon: Marty Heck, MD;  Location: [place] CV LAB;  Service: Cardiovascular;  Laterality: Bilateral;  . DEBRIDEMENT  FOOT Right 10/15/2019  . ESOPHAGOGASTRODUODENOSCOPY N/A 08/26/2019   Procedure: ESOPHAGOGASTRODUODENOSCOPY (EGD);  Surgeon: Wilford Corner, MD;  Location: [place];  Service: Endoscopy;  Laterality: N/A;  . HOT HEMOSTASIS N/A 08/26/2019   Procedure: HOT HEMOSTASIS (ARGON PLASMA COAGULATION/BICAP);  Surgeon: Wilford Corner, MD;  Location: [place];  Service: Endoscopy;  Laterality: N/A;  . I & D EXTREMITY Right 10/15/2019   Procedure: RIGHT FOOT DEBRIDEMENT;  Surgeon: Newt Minion, MD;  Location: [place];  Service: Orthopedics;  Laterality: Right;  . PERIPHERAL VASCULAR BALLOON ANGIOPLASTY  08/19/2019   Procedure: PERIPHERAL VASCULAR BALLOON ANGIOPLASTY;  Surgeon: Serafina Mitchell, MD;  Location: [place] CV LAB;  Service: Cardiovascular;;  Right femoral popliteal and PT   . PERIPHERAL VASCULAR INTERVENTION Left 08/20/2019   Procedure: PERIPHERAL VASCULAR INTERVENTION;  Surgeon: Marty Heck, MD;  Location: [place] CV LAB;  Service: Cardiovascular;  Laterality: Left;  . RIGHT/LEFT HEART CATH AND CORONARY ANGIOGRAPHY N/A 09/02/2019   Procedure: RIGHT/LEFT HEART CATH AND CORONARY  ANGIOGRAPHY;  Surgeon: Larey Dresser, MD;  Location: [place] CV LAB;  Service: Cardiovascular;  Laterality: N/A;  . SCLEROTHERAPY  08/26/2019   Procedure: SCLEROTHERAPY;  Surgeon: Wilford Corner, MD;  Location: [facility] ENDOSCOPY;  Service: Endoscopy;;   Social History   Occupational History  . Not on file  Tobacco Use  . Smoking status: Former Smoker    Years: 39.00    Quit date: 06/2019    Years since quitting: 0.4  . Smokeless tobacco: Never Used  Substance and Sexual Activity  . Alcohol use: Not Currently    Comment: alcoholism - last drink in august 2020  . Drug use: Not Currently    Types: Marijuana    Comment: none since August 2020  . Sexual activity: Not on file

## 2019-12-17 ENCOUNTER — Other Ambulatory Visit: Payer: Self-pay

## 2019-12-17 ENCOUNTER — Ambulatory Visit (HOSPITAL_COMMUNITY)
Admission: RE | Admit: 2019-12-17 | Discharge: 2019-12-17 | Disposition: A | Discharge status: Home / Self Care | Payer: Self-pay | Source: Ambulatory Visit | Attending: Internal Medicine | Admitting: Internal Medicine

## 2019-12-17 VITALS — BP 140/80 | HR 82 | Wt 144.8 lb

## 2019-12-17 DIAGNOSIS — F1020 Alcohol dependence, uncomplicated: Secondary | ICD-10-CM | POA: Insufficient documentation

## 2019-12-17 DIAGNOSIS — Z8711 Personal history of peptic ulcer disease: Secondary | ICD-10-CM | POA: Insufficient documentation

## 2019-12-17 DIAGNOSIS — K7460 Unspecified cirrhosis of liver: Secondary | ICD-10-CM | POA: Insufficient documentation

## 2019-12-17 DIAGNOSIS — I5022 Chronic systolic (congestive) heart failure: Secondary | ICD-10-CM | POA: Insufficient documentation

## 2019-12-17 DIAGNOSIS — Z7901 Long term (current) use of anticoagulants: Secondary | ICD-10-CM | POA: Insufficient documentation

## 2019-12-17 DIAGNOSIS — I2510 Atherosclerotic heart disease of native coronary artery without angina pectoris: Secondary | ICD-10-CM | POA: Insufficient documentation

## 2019-12-17 DIAGNOSIS — I429 Cardiomyopathy, unspecified: Secondary | ICD-10-CM | POA: Insufficient documentation

## 2019-12-17 DIAGNOSIS — Z79899 Other long term (current) drug therapy: Secondary | ICD-10-CM | POA: Insufficient documentation

## 2019-12-17 DIAGNOSIS — R42 Dizziness and giddiness: Secondary | ICD-10-CM | POA: Insufficient documentation

## 2019-12-17 DIAGNOSIS — Z8673 Personal history of transient ischemic attack (TIA), and cerebral infarction without residual deficits: Secondary | ICD-10-CM | POA: Insufficient documentation

## 2019-12-17 MED ORDER — SPIRONOLACTONE 25 MG PO TABS: 25.0000 mg | ORAL_TABLET | Freq: Every day | ORAL | 3 refills | Status: DC

## 2019-12-17 MED ORDER — FUROSEMIDE 20 MG PO TABS: 20.0000 mg | ORAL_TABLET | Freq: Every day | ORAL | 11 refills | Status: DC | PRN

## 2019-12-17 NOTE — Patient Instructions (Signed)
It was a pleasure seeing you today!  MEDICATIONS: -We are changing your medications today - Change furosemide from 20 mg (1 tab) daily to 20 mg (1 tab) as needed for fluid retention. You may take a furosemide tablet if you gain 3 lbs overnight or 5 lbs in 1 week, if you become more short of breath or have difficulties lying flat, or if you have swelling in you legs or ankles that is NOT gout pain. -Start spironolactone 25 mg (1 tab) daily -Call if you have questions about your medications.  NEXT APPOINTMENT: Return to clinic in 1 week for labs and 3 weeks with Pharmacy Clinic.  In general, to take care of your heart failure: -Limit your fluid intake to 2 Liters (half-gallon) per day.   -Limit your salt intake to ideally 2-3 grams (2000-3000 mg) per day. -Weigh yourself daily and record, and bring that "weight diary" to your next appointment.  (Weight gain of 2-3 pounds in 1 day typically means fluid weight.) -The medications for your heart are to help your heart and help you live longer.   -Please contact us before stopping any of your heart medications.  Call the clinic at [phone] with questions or to reschedule future appointments.

## 2019-12-17 NOTE — Progress Notes (Signed)
PCP: None  Cardiologist: Dr Shirlee Latch Vascular : Dr Darrick Penna  Ortho: Dr Lajoyce Corners  [facility]   HPI:  Briana Andrews is a 54 y.o. alcoholic woman with a history of R frontal CVA, B popliteal artery occlusions, hepatic failure, GI bleeding, cirrhosis, ETOH abuse, and chronic systolic heart failure.  Admitted 08/15/19 with increased sob and confusion. Treated for PNA and MRI brain showed R ACA stroke. Hospital course complicated new onset acute systolic heart failure possibly due to ETOH abuse. Hospital course complicated by LV thrombus, hepatitis, ARF, lower extremity cardioembolism, and shock.  She had suspected cardioembolism to bilateral popliteal arteries and underwent mechanical thrombectomy.  Received PRBCs and was able to undergo EGD 9/29 that showed duodenal and gastric ulcers which were treated endoscopically. Continued on PPI. Heparin has been converted to coumadin. R foot wound was to be followed by Vascular in the community  Discharged to SNF. On 09/21/19 she presented to the [facility] after a fall that resulted in head laceration. CT scan was unremarkable. She left SNF on 09/27/19 because she was not taken to any of her follow up appointments.    She has had follow up with [facility]/Ortho for R foot wound and had debridement 10/14/19.   Recently returned to HF Clinic for follow up with Dr. Shirlee Latch. She reported doing better overall. He noted she was doing some walking with a walker but told not to walk too much because of her foot wound.  She uses a wheelchair outside the house.  No significant dyspnea though not very active.  No chest pain.  No orthopnea/PND. No lightheadedness.   Today she returns to HF clinic for pharmacist medication titration. At last visit with MD, Sherryll Burger 24/26 mg BID was initiated. Overall, she is feeling well today. She does complain of dizziness since starting Entresto. This tends to occur when she moves from a supine position to sitting straight up very quickly. No fatigue,  chest pain or palpitations. No SOB/DOE. She is trying to remain active - noted her foot wound is making activity difficult due to pain, but she has been able to perform most ADLs. Weight is stable in clinic - she does not weigh herself daily at home. She takes furosemide 20 mg daily and has not needed and extra. No LEE, PND or orthopnea. She did complain of some swelling in her left foot last week, but this was consistent with gout pain, not fluid retention, and has now resolved. Appetite is moderate, but she reports this is her baseline. She is not following a low sodium diet. She is taking all medications as prescribed.   . Shortness of breath/dyspnea on exertion? no  . Orthopnea/PND? no . Edema? no . Lightheadedness/dizziness? Yes - started after recent Entresto initiation and consistent with orthostasis. . Daily weights at home? No, wheelchair makes it difficult. Limiting time on feet due to wound.  . Blood pressure/heart rate monitoring at home? Yes, but automatic cuff variable/not reliable. Patient made aware of her reading today to get a sense of what is "normal" for her.  . Following low-sodium/fluid-restricted diet? No, was not aware of this need. Counseled on diet this visit and patient expressed understanding  HF Medications: Metoprolol succinate 25 mg daily at bedtime  Entresto 24/26 mg BID  Digoxin 0.125 mg every other day  Furosemide 20 mg daily   Has the patient been experiencing any side effects to the medications prescribed?  Yes - dizziness since starting Entresto  Does the patient have any problems  obtaining medications due to transportation or finances?   Patient has [employer]. Noted Entresto copay was high - I provided her with copay card and provided instructions on how to activate and use card. Patient never filled Vascepa due to cost (cost ~$800.00/month). Unfortunately Vascepa copay card will only cover $150.00/month, so this is still unaffordable for her.    Understanding of regimen: good Understanding of indications: good Potential of compliance: good Patient understands to avoid NSAIDs. Patient understands to avoid decongestants.    Pertinent Lab Values (12/05/19): Marland Kitchen Serum creatinine 136, BUN 23, Potassium 3.9, Sodium 136, Digoxin 0.2 ng/mL   Vital Signs: . Weight: 144.8 lbs (last clinic weight: 145.6 lbs) . Blood pressure: 140/80  . Heart rate: 82   Assessment: 1. Chronic systolic CHF:  Cardiogenic shock in 9/20, echo 07/2019 with EF 15-20%, mild LV dilation, LV thrombus, mild-moderate MR, severely decreased RV systolic function.  LHC/RHC done 10/20 showing normal filling pressures and preserved cardiac output.  There was diffuse coronary disease, especially involving distal and branch vessels, only possible interventional target was 80% proximal RCA stenosis.  No intervention given good flow down the vessel and unlikely to significantly improve LV function.  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   -Probably NYHA class II but not very active due to right foot wound. Euvolemic-dry on exam. - Vitals: BP 140/80, HR 82  - Labs (12/05/2019): Scr increased from 1.05 to 1.23, K stable at 3.9.  -Complains of dizziness/orthostasis, consistent with starting Entresto on 11/25/19. Change furosemide from 20 mg daily to 20 mg PRN fluid retention. Patient was educated on signs of fluid weight gain and instructed to take a dose of furosemide if weight increases 3 lbs overnight or 5lbs in a week, as well as if she has any swelling in feet/ankles that is not consistent with gout, becomes more SOB or has difficulty lying flat. She will call the office if symptoms do not resolve with 2 doses.   - Continue metoprolol succinate 25 mg nightly - Continue Entresto 24/26 mg BID. Plan on increasing at next visit if dizziness resolves with decrease in furosemide.  - Start spironolactone 25 mg daily.  Repeat BMET in 1 week.   - Continue digoxin 0.125 every other day  - Repeat echo in scheduled for 01/27/2020.    Plan: 1) Medication changes: Based on clinical presentation, vital signs and recent labs will start spironolactone 25 mg daily and change furosemide from 20 mg daily to 20 mg PRN . Repeat BMET in 1 week.  2) Follow-up: Pharmacy Clinic in 3 weeks.   Audry Riles, PharmD, BCPS, BCCP, CPP Heart Failure Clinic Pharmacist [phone]

## 2019-12-22 ENCOUNTER — Ambulatory Visit (INDEPENDENT_AMBULATORY_CARE_PROVIDER_SITE_OTHER): Payer: Self-pay

## 2019-12-22 ENCOUNTER — Other Ambulatory Visit: Payer: Self-pay

## 2019-12-22 DIAGNOSIS — I240 Acute coronary thrombosis not resulting in myocardial infarction: Secondary | ICD-10-CM

## 2019-12-22 DIAGNOSIS — Z5181 Encounter for therapeutic drug level monitoring: Secondary | ICD-10-CM

## 2019-12-22 LAB — POCT INR: INR: 1.8 — AB (ref 2.0–3.0)

## 2019-12-22 MED ORDER — WARFARIN SODIUM 2.5 MG PO TABS: ORAL_TABLET | ORAL | 1 refills | Status: DC

## 2019-12-22 NOTE — Patient Instructions (Signed)
-   START NEW DOSAGE of 2 tablets EVERY DAY EXCEPT 1.5 TABLETS ON MONDAYS & FRIDAYS - Recheck in 2 weeks.

## 2019-12-23 ENCOUNTER — Ambulatory Visit (HOSPITAL_COMMUNITY)
Admission: RE | Admit: 2019-12-23 | Discharge: 2019-12-23 | Disposition: A | Discharge status: Home / Self Care | Payer: 59 | Source: Ambulatory Visit | Attending: Cardiology | Admitting: Cardiology

## 2019-12-23 ENCOUNTER — Other Ambulatory Visit (HOSPITAL_COMMUNITY): Payer: Self-pay

## 2019-12-23 DIAGNOSIS — I5022 Chronic systolic (congestive) heart failure: Secondary | ICD-10-CM

## 2019-12-23 LAB — BASIC METABOLIC PANEL
Anion gap: 8 (ref 5–15)
BUN: 26 mg/dL — ABNORMAL HIGH (ref 6–20)
CO2: 26 mmol/L (ref 22–32)
Calcium: 10.2 mg/dL (ref 8.9–10.3)
Chloride: 103 mmol/L (ref 98–111)
Creatinine, Ser: 1.06 mg/dL — ABNORMAL HIGH (ref 0.44–1.00)
GFR calc Af Amer: >60 mL/min (ref >60)
GFR calc non Af Amer: 59 mL/min — ABNORMAL LOW (ref >60)
Glucose, Bld: 106 mg/dL — ABNORMAL HIGH (ref 70–99)
Potassium: 4.1 mmol/L (ref 3.5–5.1)
Sodium: 137 mmol/L (ref 135–145)

## 2019-12-24 ENCOUNTER — Telehealth: Payer: Self-pay | Admitting: Orthopedic Surgery

## 2019-12-24 NOTE — Telephone Encounter (Signed)
Patient called needing Rx refilled (Tramadol) The number to contact patient is [phone]

## 2019-12-25 ENCOUNTER — Encounter: Payer: Self-pay | Admitting: Physician Assistant

## 2019-12-25 ENCOUNTER — Other Ambulatory Visit: Payer: Self-pay

## 2019-12-25 ENCOUNTER — Ambulatory Visit (INDEPENDENT_AMBULATORY_CARE_PROVIDER_SITE_OTHER): Payer: 59 | Admitting: Physician Assistant

## 2019-12-25 VITALS — Ht 65.0 in | Wt 144.8 lb

## 2019-12-25 DIAGNOSIS — L02611 Cutaneous abscess of right foot: Secondary | ICD-10-CM

## 2019-12-25 MED ORDER — TRAMADOL HCL 50 MG PO TABS: 50.0000 mg | ORAL_TABLET | Freq: Four times a day (QID) | ORAL | 0 refills | Status: DC | PRN

## 2019-12-25 NOTE — Telephone Encounter (Signed)
Please advise, thank you.

## 2019-12-25 NOTE — Telephone Encounter (Signed)
She has an appointment this afternoon . Will discuss with her then

## 2019-12-25 NOTE — Progress Notes (Signed)
Office Visit Note   Patient: Briana Andrews           Date of Birth: [birth year]           MRN: [id number] Visit Date: 12/25/2019              Requested by: No referring provider defined for this encounter. PCP: Patient, [name]  Chief Complaint  Patient presents with  . Right Foot - Routine Post Op    10/15/19 right foot debridement         HPI: This is a pleasant woman who is 2-1/2 months status post right foot debridement with graft she is doing well she feels her foot is improving  Assessment & Plan: Visit Diagnoses: No diagnosis found.  Plan: She will continue to use her compression sock.  Follow-up in 2 weeks.  Follow-Up Instructions: No follow-ups on file.   Ortho Exam  Patient is alert, oriented, no adenopathy, well-dressed, normal affect, normal respiratory effort. Focused examination of her foot demonstrates excellent granulation tissue with healthy skin ingrowth into the area of the wound there is no odor and overall the drainage is well controlled swelling is well controlled no surrounding cellulitis  Imaging: No results found. No images are attached to the encounter.  Labs: Lab Results  Component Value Date   HGBA1C 5.8 (H) 08/17/2019   ESRSEDRATE 0 08/16/2019   REPTSTATUS 08/19/2019 FINAL 08/18/2019   CULT  08/18/2019    NO GROWTH Performed at [facility], [place] [address], [place], [place] [address]      Lab Results  Component Value Date   ALBUMIN 3.1 (L) 11/25/2019   ALBUMIN 2.6 (L) 10/15/2019   ALBUMIN 1.2 (L) 09/05/2019    Lab Results  Component Value Date   MG 2.1 09/08/2019   MG 1.4 (L) 09/07/2019   MG 1.9 09/02/2019   No results found for: VD25OH  No results found for: PREALBUMIN CBC EXTENDED Latest Ref Rng & Units 11/25/2019 10/15/2019 09/29/2019  WBC 4.0 - 10.5 K/uL 8.7 7.4 8.3  RBC 3.87 - 5.11 MIL/uL 3.62(L) 3.38(L) 3.21(L)  HGB 12.0 - 15.0 g/dL 11.2(L) 10.6(L) 10.2(L)  HCT 36.0 - 46.0 % 34.9(L) 32.8(L) 31.4(L)    PLT 150 - 400 K/uL 449(H) 535(H) 680(H)  NEUTROABS 1.7 - 7.7 K/uL - - -  LYMPHSABS 0.7 - 4.0 K/uL - - -     Body mass index is 24.1 kg/m.  Orders:  No orders of the defined types were placed in this encounter.  Meds ordered this encounter  Medications  . traMADol (ULTRAM) 50 MG tablet    Sig: Take 1 tablet (50 mg total) by mouth every 6 (six) hours as needed for moderate pain.    Dispense:  30 tablet    Refill:  0     Procedures: No procedures performed  Clinical Data: No additional findings.  ROS:  All other systems negative, except as noted in the HPI. Review of Systems  Objective: Vital Signs: Ht 5\' 5"  (1.651 m)   Wt 144 lb 12.8 oz (65.7 kg)   BMI 24.10 kg/m   Specialty Comments:  No specialty comments available.  PMFS History: Patient Active Problem List   Diagnosis Date Noted  . Gangrene of right foot ([place])   . Cutaneous abscess of right foot   . Endotracheally intubated   . Acute on chronic systolic CHF (congestive heart failure) ([place])   . GI bleed 08/26/2019  . Pressure injury of skin 08/23/2019  .  Acute upper GI bleed   . Shock circulatory (HCC)   . Palliative care by specialist   . Goals of care, counseling/discussion   . Advanced care planning/counseling discussion   . Jaundice   . Cerebral embolism with cerebral infarction 08/17/2019  . PVD (peripheral vascular disease) (HCC) 08/17/2019  . 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 Acute CHF (congestive heart failure) (HCC) 08/17/2019  . Abnormal LFTs 08/16/2019  . Acute metabolic encephalopathy 08/16/2019  . SOB (shortness of breath) 08/16/2019  . AKI (acute kidney injury) (HCC) 08/16/2019  . Fall 08/16/2019  . Lactic acidosis 08/16/2019  . Back pain 08/16/2019  . Liver failure without hepatic coma (HCC) 08/16/2019  . Multifocal pneumonia 08/16/2019  . Alcohol abuse   . Tobacco abuse   . Shortness of breath    Past Medical History:  Diagnosis Date  . Alcohol  abuse   . CHF (congestive heart failure) (HCC)    last EF 35-40%. etoh induced  . Coronary artery disease    80%RCA, 50%LCX, 40%LAD  . Depression   . DVT (deep venous thrombosis) (HCC)    right popliteal vein (08/17/19)  . GERD (gastroesophageal reflux disease)   . Hyperlipidemia   . LV (left ventricular) mural thrombus   . Stroke [facility])    / memory loss  . Tobacco abuse     Family History  Problem Relation Age of Onset  . Heart disease Mother     Past Surgical History:  Procedure Laterality Date  . ABDOMINAL AORTOGRAM W/LOWER EXTREMITY N/A 08/19/2019   Procedure: ABDOMINAL AORTOGRAM W/LOWER EXTREMITY;  Surgeon: Nada Libman, MD;  Location: [facility] INVASIVE CV LAB;  Service: Cardiovascular;  Laterality: N/A;  . ABDOMINAL AORTOGRAM W/LOWER EXTREMITY Bilateral 08/20/2019   Procedure: ABDOMINAL AORTOGRAM W/LOWER EXTREMITY;  Surgeon: Cephus Shelling, MD;  Location: [facility] INVASIVE CV LAB;  Service: Cardiovascular;  Laterality: Bilateral;  . DEBRIDEMENT  FOOT Right 10/15/2019  . ESOPHAGOGASTRODUODENOSCOPY N/A 08/26/2019   Procedure: ESOPHAGOGASTRODUODENOSCOPY (EGD);  Surgeon: Charlott Rakes, MD;  Location: [facility] ENDOSCOPY;  Service: Endoscopy;  Laterality: N/A;  . HOT HEMOSTASIS N/A 08/26/2019   Procedure: HOT HEMOSTASIS (ARGON PLASMA COAGULATION/BICAP);  Surgeon: Charlott Rakes, MD;  Location: [facility] ENDOSCOPY;  Service: Endoscopy;  Laterality: N/A;  . I & D EXTREMITY Right 10/15/2019   Procedure: RIGHT FOOT DEBRIDEMENT;  Surgeon: Nadara Mustard, MD;  Location: [facility] OR;  Service: Orthopedics;  Laterality: Right;  . PERIPHERAL VASCULAR BALLOON ANGIOPLASTY  08/19/2019   Procedure: PERIPHERAL VASCULAR BALLOON ANGIOPLASTY;  Surgeon: Nada Libman, MD;  Location: [facility] INVASIVE CV LAB;  Service: Cardiovascular;;  Right femoral popliteal and PT   . PERIPHERAL VASCULAR INTERVENTION Left 08/20/2019   Procedure: PERIPHERAL VASCULAR INTERVENTION;  Surgeon: Cephus Shelling, MD;  Location: [facility] INVASIVE  CV LAB;  Service: Cardiovascular;  Laterality: Left;  . RIGHT/LEFT HEART CATH AND CORONARY ANGIOGRAPHY N/A 09/02/2019   Procedure: RIGHT/LEFT HEART CATH AND CORONARY ANGIOGRAPHY;  Surgeon: Laurey Morale, MD;  Location: [facility] INVASIVE CV LAB;  Service: Cardiovascular;  Laterality: N/A;  . SCLEROTHERAPY  08/26/2019   Procedure: SCLEROTHERAPY;  Surgeon: Charlott Rakes, MD;  Location: [facility] ENDOSCOPY;  Service: Endoscopy;;   Social History   Occupational History  . Not on file  Tobacco Use  . Smoking status: Former Smoker    Years: 39.00    Quit date: 06/2019    Years since quitting: 0.4  . Smokeless tobacco: Never Used  Substance and Sexual Activity  . Alcohol use: Not  Currently    Comment: alcoholism - last drink in august 2020  . Drug use: Not Currently    Types: Marijuana    Comment: none since August 2020  . Sexual activity: Not on file

## 2020-01-05 ENCOUNTER — Ambulatory Visit (INDEPENDENT_AMBULATORY_CARE_PROVIDER_SITE_OTHER): Payer: Self-pay

## 2020-01-05 ENCOUNTER — Inpatient Hospital Stay (HOSPITAL_COMMUNITY): Admission: RE | Admit: 2020-01-05 | Payer: 59 | Source: Ambulatory Visit

## 2020-01-05 ENCOUNTER — Other Ambulatory Visit: Payer: Self-pay

## 2020-01-05 ENCOUNTER — Telehealth (HOSPITAL_COMMUNITY): Payer: Self-pay

## 2020-01-05 DIAGNOSIS — I240 Acute coronary thrombosis not resulting in myocardial infarction: Secondary | ICD-10-CM

## 2020-01-05 DIAGNOSIS — Z5181 Encounter for therapeutic drug level monitoring: Secondary | ICD-10-CM

## 2020-01-05 LAB — POCT INR: INR: 1.7 — AB (ref 2.0–3.0)

## 2020-01-05 NOTE — Telephone Encounter (Signed)

## 2020-01-05 NOTE — Patient Instructions (Signed)
-   Take 3 tablets warfarin tonight - START NEW DOSAGE of warfarin 2 tablets EVERY DAY - Recheck in 2 weeks.

## 2020-01-06 ENCOUNTER — Ambulatory Visit (HOSPITAL_COMMUNITY)
Admission: RE | Admit: 2020-01-06 | Discharge: 2020-01-06 | Disposition: A | Discharge status: Home / Self Care | Payer: 59 | Source: Ambulatory Visit | Attending: Surgery | Admitting: Surgery

## 2020-01-06 ENCOUNTER — Ambulatory Visit (INDEPENDENT_AMBULATORY_CARE_PROVIDER_SITE_OTHER): Payer: PRIVATE HEALTH INSURANCE | Admitting: Vascular Surgery

## 2020-01-06 ENCOUNTER — Ambulatory Visit (INDEPENDENT_AMBULATORY_CARE_PROVIDER_SITE_OTHER)
Admission: RE | Admit: 2020-01-06 | Discharge: 2020-01-06 | Disposition: A | Discharge status: Home / Self Care | Payer: 59 | Source: Ambulatory Visit | Attending: Surgery | Admitting: Surgery

## 2020-01-06 ENCOUNTER — Encounter: Payer: Self-pay | Admitting: Vascular Surgery

## 2020-01-06 VITALS — BP 126/78 | HR 77 | Temp 97.2°F | Ht 64.5 in | Wt 143.0 lb

## 2020-01-06 DIAGNOSIS — L97519 Non-pressure chronic ulcer of other part of right foot with unspecified severity: Secondary | ICD-10-CM | POA: Diagnosis present

## 2020-01-06 DIAGNOSIS — I739 Peripheral vascular disease, unspecified: Secondary | ICD-10-CM

## 2020-01-06 NOTE — Progress Notes (Signed)
Patient name: Briana Andrews MRN: [id number] DOB: Apr 23, 1966 Sex: female  REASON FOR VISIT: 3 month follow-up with ABIs and right lower extremity arterial duplex  HPI: Briana Andrews is a 54 y.o. female with history of CHF and severely depressed EF and left ventricular thrombus who underwent bilateral lower extremity mechanical thrombectomies by myself and Dr. Myra Gianotti last year.  Patient was initially seen with bilateral lower extremity ischemia.  Dr. Myra Gianotti initially performed right lower extremity mechanical thrombectomy on 08/19/2019 with right SFA popliteal and PT angioplasty.  I subsequent performed left lower extremity thrombectomy the following day on 08/20/2019.  This was all thought to be related to cardiac thrombus.  Ultimately her left foot has completely healed without any issues.  Her right foot had a large necrotic eschar on the dorsum of the foot when last evaluated.  She subsequently underwent debridement with Dr. Lajoyce Corners and feels this is improving significantly.  No rest pain in the feet bilaterally.  Remains on coumadin.   Past Medical History:  Diagnosis Date  . Alcohol abuse   . CHF (congestive heart failure) (HCC)    last EF 35-40%. etoh induced  . Coronary artery disease    80%RCA, 50%LCX, 40%LAD  . Depression   . DVT (deep venous thrombosis) (HCC)    right popliteal vein (08/17/19)  . GERD (gastroesophageal reflux disease)   . Hyperlipidemia   . LV (left ventricular) mural thrombus   . Stroke [facility])    / memory loss  . Tobacco abuse     Past Surgical History:  Procedure Laterality Date  . ABDOMINAL AORTOGRAM W/LOWER EXTREMITY N/A 08/19/2019   Procedure: ABDOMINAL AORTOGRAM W/LOWER EXTREMITY;  Surgeon: Nada Libman, MD;  Location: [facility] INVASIVE CV LAB;  Service: Cardiovascular;  Laterality: N/A;  . ABDOMINAL AORTOGRAM W/LOWER EXTREMITY Bilateral 08/20/2019   Procedure: ABDOMINAL AORTOGRAM W/LOWER EXTREMITY;  Surgeon: Cephus Shelling, MD;  Location: [facility] INVASIVE  CV LAB;  Service: Cardiovascular;  Laterality: Bilateral;  . DEBRIDEMENT  FOOT Right 10/15/2019  . ESOPHAGOGASTRODUODENOSCOPY N/A 08/26/2019   Procedure: ESOPHAGOGASTRODUODENOSCOPY (EGD);  Surgeon: Charlott Rakes, MD;  Location: [facility] ENDOSCOPY;  Service: Endoscopy;  Laterality: N/A;  . HOT HEMOSTASIS N/A 08/26/2019   Procedure: HOT HEMOSTASIS (ARGON PLASMA COAGULATION/BICAP);  Surgeon: Charlott Rakes, MD;  Location: [facility] ENDOSCOPY;  Service: Endoscopy;  Laterality: N/A;  . I & D EXTREMITY Right 10/15/2019   Procedure: RIGHT FOOT DEBRIDEMENT;  Surgeon: Nadara Mustard, MD;  Location: [facility] OR;  Service: Orthopedics;  Laterality: Right;  . PERIPHERAL VASCULAR BALLOON ANGIOPLASTY  08/19/2019   Procedure: PERIPHERAL VASCULAR BALLOON ANGIOPLASTY;  Surgeon: Nada Libman, MD;  Location: [facility] LAB;  Service: Cardiovascular;;  Right femoral popliteal and PT   . PERIPHERAL VASCULAR INTERVENTION Left 08/20/2019   Procedure: PERIPHERAL VASCULAR INTERVENTION;  Surgeon: Cephus Shelling, MD;  Location: [facility] INVASIVE CV LAB;  Service: Cardiovascular;  Laterality: Left;  . RIGHT/LEFT HEART CATH AND CORONARY ANGIOGRAPHY N/A 09/02/2019   Procedure: RIGHT/LEFT HEART CATH AND CORONARY ANGIOGRAPHY;  Surgeon: Laurey Morale, MD;  Location: [facility] INVASIVE CV LAB;  Service: Cardiovascular;  Laterality: N/A;  . SCLEROTHERAPY  08/26/2019   Procedure: SCLEROTHERAPY;  Surgeon: Charlott Rakes, MD;  Location: [facility] ENDOSCOPY;  Service: Endoscopy;;    Family History  Problem Relation Age of Onset  . Heart disease Mother     SOCIAL HISTORY: Social History   Tobacco Use  . Smoking status: Former Smoker    Years: 39.00    Quit date: 06/2019  Years since quitting: 0.5  . Smokeless tobacco: Never Used  Substance Use Topics  . Alcohol use: Not Currently    Comment: alcoholism - last drink in august 2020    Allergies  Allergen Reactions  . Norco [Hydrocodone-Acetaminophen] Nausea And Vomiting  .  Penicillins Other (See Comments)    Unsure of reaction Did it involve swelling of the face/tongue/throat, SOB, or low BP? Unknown Did it involve sudden or severe rash/hives, skin peeling, or any reaction on the inside of your mouth or nose? Unknown Did you need to seek medical attention at a hospital or doctor's office? Unknown When did it last happen?[place] If all above answers are "NO", may proceed with cephalosporin use.    Current Outpatient Medications  Medication Sig Dispense Refill  . digoxin (LANOXIN) 0.125 MG tablet Take 1 tablet (0.125 mg total) by mouth every other day. 15 tablet 5  . folic acid (FOLVITE) 1 MG tablet Take 1 mg by mouth daily.    . furosemide (LASIX) 20 MG tablet Take 1 tablet (20 mg total) by mouth daily as needed (fluid retention.). 30 tablet 11  . ibuprofen (ADVIL) 200 MG tablet Take 200 mg by mouth every 6 (six) hours as needed for headache or moderate pain.    Marland Kitchen icosapent Ethyl (VASCEPA) 1 g capsule Take 2 capsules (2 g total) by mouth 2 (two) times daily. 120 capsule 5  . metoprolol succinate (TOPROL-XL) 25 MG 24 hr tablet Take 25 mg by mouth daily.    . pantoprazole (PROTONIX) 40 MG tablet Take 40 mg by mouth daily.    . rosuvastatin (CRESTOR) 5 MG tablet Take 5 mg by mouth daily.    . sacubitril-valsartan (ENTRESTO) 24-26 MG Take 1 tablet by mouth 2 (two) times daily. 60 tablet 5  . silver sulfADIAZINE (SILVADENE) 1 % cream Apply 1 application topically every other day.    . spironolactone (ALDACTONE) 25 MG tablet Take 1 tablet (25 mg total) by mouth daily. 90 tablet 3  . traMADol (ULTRAM) 50 MG tablet Take 1 tablet (50 mg total) by mouth every 6 (six) hours as needed for moderate pain. 30 tablet 0  . warfarin (COUMADIN) 2.5 MG tablet Take as directed by the anti-coag clinic. 60 tablet 1   No current facility-administered medications for this visit.    REVIEW OF SYSTEMS:  [X]  denotes positive finding, [ ]  denotes negative finding Cardiac   Comments:  Chest pain or chest pressure:    Shortness of breath upon exertion:    Short of breath when lying flat:    Irregular heart rhythm:        Vascular    Pain in calf, thigh, or hip brought on by ambulation:    Pain in feet at night that wakes you up from your sleep:     Blood clot in your veins:    Leg swelling:         Pulmonary    Oxygen at home:    Productive cough:     Wheezing:         Neurologic    Sudden weakness in arms or legs:     Sudden numbness in arms or legs:     Sudden onset of difficulty speaking or slurred speech:    Temporary loss of vision in one eye:     Problems with dizziness:         Gastrointestinal    Blood in stool:     Vomited blood:  Genitourinary    Burning when urinating:     Blood in urine:        Psychiatric    Major depression:         Hematologic    Bleeding problems:    Problems with blood clotting too easily:        Skin    Rashes or ulcers:        Constitutional    Fever or chills:      PHYSICAL EXAM: Vitals:   01/06/20 1502  BP: 126/78  Pulse: 77  Temp: (!) 97.2 F (36.2 C)  TempSrc: Temporal  SpO2: 99%  Weight: 143 lb (64.9 kg)  Height: 5' 4.5" (1.638 m)    GENERAL: The patient is a well-nourished female, in no acute distress. The vital signs are documented above. CARDIAC: There is a regular rate and rhythm.  VASCULAR:  Palpable femoral pulses bilaterally Left foot without tissue loss Right foot pictured below, nice granulation bed on dorsum of foot PULMONARY: There is good air exchange bilaterally without wheezing or rales. ABDOMEN: Soft and non-tender with normal pitched bowel sounds.  MUSCULOSKELETAL: There are no major deformities or cyanosis. NEUROLOGIC: No focal weakness or paresthesias are detected. SKIN: There are no ulcers or rashes noted. PSYCHIATRIC: The patient has a normal affect.      DATA:   Reviewed ABIs which are 0.82 on the right monophasic and 0.72 on the left  monophasic  Right lower extremity arterial duplex shows high-grade stenosis in the right popliteal artery  Assessment/Plan:  54 year old female with severely reduced EF in setting of heart failure and ventricular thrombus that presented last year with bilateral lower extremity ischemia suspected to be thromboembolic.  Ultimately underwent bilateral lower extremity mechanical thrombectomies by myself and Dr. Myra Gianotti.  Ultimately the left foot has completely healed.  The right foot has shown significant improvement and she has undergone debridement of large eschar on the dorsum of the foot with good granulation tissue now.  She feels this is all healing.  Her right lower extremity arterial duplex today shows high-grade stenosis in the right popliteal artery.  Discussed the option of proceeding back for arteriogram but that would require holding her anticoagulation which may be exceedingly high risk (or she may require admit for heparin bridge).  Given the wound is showing significant progress and she has no other symptoms in the foot I think we can continue close interval surveillance and I will see her back in 3 months.   Cephus Shelling, MD [facility] Office: [phone]

## 2020-01-07 ENCOUNTER — Other Ambulatory Visit: Payer: Self-pay | Admitting: *Deleted

## 2020-01-07 DIAGNOSIS — I739 Peripheral vascular disease, unspecified: Secondary | ICD-10-CM

## 2020-01-08 ENCOUNTER — Encounter: Payer: Self-pay | Admitting: Physician Assistant

## 2020-01-08 ENCOUNTER — Ambulatory Visit (INDEPENDENT_AMBULATORY_CARE_PROVIDER_SITE_OTHER): Payer: 59 | Admitting: Physician Assistant

## 2020-01-08 ENCOUNTER — Other Ambulatory Visit: Payer: Self-pay

## 2020-01-08 VITALS — Ht 64.0 in | Wt 143.0 lb

## 2020-01-08 DIAGNOSIS — L8990 Pressure ulcer of unspecified site, unspecified stage: Secondary | ICD-10-CM

## 2020-01-08 NOTE — Progress Notes (Signed)
Office Visit Note   Patient: Briana Andrews           Date of Birth: 04-16-1966           MRN: [id number] Visit Date: 01/08/2020              Requested by: No referring provider defined for this encounter. PCP: Patient, [name]  Chief Complaint  Patient presents with  . Right Foot - Routine Post Op    10/15/19 right foot deb      HPI: This is a pleasant woman who is here for follow-up on her right dorsal foot wound.  She was given a compression sock however she said she feels it is too tight and has difficulty tolerating it especially at night.  She recently had a follow-up with her vascular surgeon Dr. Carlis Abbott who did have some concerns that she was having some decreased blood flow in the vessels behind her knee.  He is observing this for now.  Assessment & Plan: Visit Diagnoses: No diagnosis found.  Plan: I did place her compression sock on.  Certainly she can try 1 size larger to see this is more comfortable at night and then wean into a smaller sock.  Right now she has very robust healthy tissue but she knows if she sees any changes she is to contact us immediately follow-up in 3 weeks  Follow-Up Instructions: No follow-ups on file.   Ortho Exam  Patient is alert, oriented, no adenopathy, well-dressed, normal affect, normal respiratory effort. Focused examination of her right foot demonstrates some ingrowing skin.  She does have robust healthy red granulation tissue.  No surrounding cellulitis or significant swelling  Imaging: No results found. No images are attached to the encounter.  Labs: Lab Results  Component Value Date   HGBA1C 5.8 (H) 08/17/2019   ESRSEDRATE 0 08/16/2019   REPTSTATUS 08/19/2019 FINAL 08/18/2019   CULT  08/18/2019    NO GROWTH Performed at [facility], [address], [place], [place] [address]      Lab Results  Component Value Date   ALBUMIN 3.1 (L) 11/25/2019   ALBUMIN 2.6 (L) 10/15/2019   ALBUMIN 1.2 (L) 09/05/2019     Lab Results  Component Value Date   MG 2.1 09/08/2019   MG 1.4 (L) 09/07/2019   MG 1.9 09/02/2019   No results found for: VD25OH  No results found for: PREALBUMIN CBC EXTENDED Latest Ref Rng & Units 11/25/2019 10/15/2019 09/29/2019  WBC 4.0 - 10.5 K/uL 8.7 7.4 8.3  RBC 3.87 - 5.11 MIL/uL 3.62(L) 3.38(L) 3.21(L)  HGB 12.0 - 15.0 g/dL 11.2(L) 10.6(L) 10.2(L)  HCT 36.0 - 46.0 % 34.9(L) 32.8(L) 31.4(L)  PLT 150 - 400 K/uL 449(H) 535(H) 680(H)  NEUTROABS 1.7 - 7.7 K/uL - - -  LYMPHSABS 0.7 - 4.0 K/uL - - -     Body mass index is 24.55 kg/m.  Orders:  No orders of the defined types were placed in this encounter.  No orders of the defined types were placed in this encounter.    Procedures: No procedures performed  Clinical Data: No additional findings.  ROS:  All other systems negative, except as noted in the HPI. Review of Systems  Objective: Vital Signs: Ht 5\' 4"  (1.626 m)   Wt 143 lb (64.9 kg)   BMI 24.55 kg/m   Specialty Comments:  No specialty comments available.  PMFS History: Patient Active Problem List   Diagnosis Date Noted  . Gangrene  of right foot (HCC)   . Cutaneous abscess of right foot   . Endotracheally intubated   . Acute on chronic systolic CHF (congestive heart failure) (HCC)   . GI bleed 08/26/2019  . Pressure injury of skin 08/23/2019  . Acute upper GI bleed   . Shock circulatory (HCC)   . Palliative care by specialist   . Goals of care, counseling/discussion   . Advanced care planning/counseling discussion   . Jaundice   . Cerebral embolism with cerebral infarction 08/17/2019  . PVD (peripheral vascular disease) (HCC) 08/17/2019  . 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 Acute CHF (congestive heart failure) (HCC) 08/17/2019  . Abnormal LFTs 08/16/2019  . Acute metabolic encephalopathy 08/16/2019  . SOB (shortness of breath) 08/16/2019  . AKI (acute kidney injury) (HCC) 08/16/2019  . Fall 08/16/2019  .  Lactic acidosis 08/16/2019  . Back pain 08/16/2019  . Liver failure without hepatic coma (HCC) 08/16/2019  . Multifocal pneumonia 08/16/2019  . Alcohol abuse   . Tobacco abuse   . Shortness of breath    Past Medical History:  Diagnosis Date  . Alcohol abuse   . CHF (congestive heart failure) (HCC)    last EF 35-40%. etoh induced  . Coronary artery disease    80%RCA, 50%LCX, 40%LAD  . Depression   . DVT (deep venous thrombosis) (HCC)    right popliteal vein (08/17/19)  . GERD (gastroesophageal reflux disease)   . Hyperlipidemia   . LV (left ventricular) mural thrombus   . Stroke [facility])    / memory loss  . Tobacco abuse     Family History  Problem Relation Age of Onset  . Heart disease Mother     Past Surgical History:  Procedure Laterality Date  . ABDOMINAL AORTOGRAM W/LOWER EXTREMITY N/A 08/19/2019   Procedure: ABDOMINAL AORTOGRAM W/LOWER EXTREMITY;  Surgeon: Nada Libman, MD;  Location: [facility] INVASIVE CV LAB;  Service: Cardiovascular;  Laterality: N/A;  . ABDOMINAL AORTOGRAM W/LOWER EXTREMITY Bilateral 08/20/2019   Procedure: ABDOMINAL AORTOGRAM W/LOWER EXTREMITY;  Surgeon: Cephus Shelling, MD;  Location: [facility] INVASIVE CV LAB;  Service: Cardiovascular;  Laterality: Bilateral;  . DEBRIDEMENT  FOOT Right 10/15/2019  . ESOPHAGOGASTRODUODENOSCOPY N/A 08/26/2019   Procedure: ESOPHAGOGASTRODUODENOSCOPY (EGD);  Surgeon: Charlott Rakes, MD;  Location: [facility] ENDOSCOPY;  Service: Endoscopy;  Laterality: N/A;  . HOT HEMOSTASIS N/A 08/26/2019   Procedure: HOT HEMOSTASIS (ARGON PLASMA COAGULATION/BICAP);  Surgeon: Charlott Rakes, MD;  Location: [facility] ENDOSCOPY;  Service: Endoscopy;  Laterality: N/A;  . I & D EXTREMITY Right 10/15/2019   Procedure: RIGHT FOOT DEBRIDEMENT;  Surgeon: Nadara Mustard, MD;  Location: [facility] OR;  Service: Orthopedics;  Laterality: Right;  . PERIPHERAL VASCULAR BALLOON ANGIOPLASTY  08/19/2019   Procedure: PERIPHERAL VASCULAR BALLOON ANGIOPLASTY;  Surgeon: Nada Libman, MD;  Location: [facility] INVAS[facility] CV LAB;  Service: Cardiovascular;;  Right femoral popliteal and PT   . PERIPHERAL VASCULAR INTERVENTION Left 08/20/2019   Procedure: PERIPHERAL VASCULAR INTERVENTION;  Surgeon: Cephus Shelling, MD;  Location: [facility] INVASIVE CV LAB;  Service: Cardiovascular;  Laterality: Left;  . RIGHT/LEFT HEART CATH AND CORONARY ANGIOGRAPHY N/A 09/02/2019   Procedure: RIGHT/LEFT HEART CATH AND CORONARY ANGIOGRAPHY;  Surgeon: Laurey Morale, MD;  Location: [facility] INVASIVE CV LAB;  Service: Cardiovascular;  Laterality: N/A;  . SCLEROTHERAPY  08/26/2019   Procedure: SCLEROTHERAPY;  Surgeon: Charlott Rakes, MD;  Location: [facility] ENDOSCOPY;  Service: Endoscopy;;   Social History   Occupational History  . Not on  file  Tobacco Use  . Smoking status: Former Smoker    Years: 39.00    Quit date: 06/2019    Years since quitting: 0.5  . Smokeless tobacco: Never Used  Substance and Sexual Activity  . Alcohol use: Not Currently    Comment: alcoholism - last drink in august 2020  . Drug use: Not Currently    Types: Marijuana    Comment: none since August 2020  . Sexual activity: Not on file

## 2020-01-14 ENCOUNTER — Telehealth: Payer: Self-pay

## 2020-01-14 MED ORDER — CARVEDILOL 3.125 MG PO TABS: 3.1250 mg | ORAL_TABLET | Freq: Two times a day (BID) | ORAL | 4 refills | Status: DC

## 2020-01-14 NOTE — Telephone Encounter (Signed)
Patient verbalized understanding of recommendations and knows to stop metoprolol and start carvedilol 3.125 mg by mouth two times a day. Rx sent to pharmacy.

## 2020-01-14 NOTE — Telephone Encounter (Signed)
Spoke w/ pt and her son, Azrael, on speaker phone.  She states that she is very unhappy, as her hair is falling out and her son read on the internet that it could be from her anticoagulant.  Advised her that hair loss is not a side effect of warfarin. Reviewed her med list and she reports that she noticed her hair loss in November and it has been worsening since then.  Pt started metoprolol succinate 25 mg once daily on 10/26/19. She would like to know if there is a different med that she can try that would not have this potential side effect.  Advised her that I will send to Dr. Azucena Cecil & his nurse for review. She is appreciative and will await a call back.

## 2020-01-14 NOTE — Telephone Encounter (Signed)
Briana Odea, MD  You; Jarvis Newcomer, RN 4 minutes ago (3:39 PM)   Patient started on metoprolol but called complaining of hair loss. This is a very rare side effect of metoprolol. Patient can stop metoprolol to see if symptoms improve. Please stop Toprol-XL, start Coreg 3.125 mg twice daily in its place.   Routing comment

## 2020-01-14 NOTE — Telephone Encounter (Signed)
Patient calling in about warfarin dosage  PLease advise when able

## 2020-01-19 ENCOUNTER — Other Ambulatory Visit: Payer: Self-pay

## 2020-01-19 ENCOUNTER — Ambulatory Visit (INDEPENDENT_AMBULATORY_CARE_PROVIDER_SITE_OTHER): Payer: 59

## 2020-01-19 DIAGNOSIS — I240 Acute coronary thrombosis not resulting in myocardial infarction: Secondary | ICD-10-CM

## 2020-01-19 DIAGNOSIS — Z5181 Encounter for therapeutic drug level monitoring: Secondary | ICD-10-CM | POA: Diagnosis not present

## 2020-01-19 LAB — POCT INR: INR: 2.1 (ref 2.0–3.0)

## 2020-01-19 NOTE — Patient Instructions (Signed)
-   continue dosage of warfarin 2 tablets EVERY DAY - Recheck in 3 weeks.

## 2020-01-26 ENCOUNTER — Telehealth (HOSPITAL_COMMUNITY): Payer: Self-pay

## 2020-01-26 ENCOUNTER — Telehealth: Payer: Self-pay | Admitting: Cardiology

## 2020-01-26 MED ORDER — WARFARIN SODIUM 2.5 MG PO TABS: ORAL_TABLET | ORAL | 0 refills | Status: DC

## 2020-01-26 NOTE — Telephone Encounter (Signed)
Refill sent in as requested. 

## 2020-01-26 NOTE — Telephone Encounter (Signed)

## 2020-01-26 NOTE — Telephone Encounter (Signed)
If Home Health RN is calling please get Coumadin Nurse on the phone STAT  1.  Are you calling in regards to an appointment?no 2.  Are you calling for a refill ? Yes [facility] [address]   33.  Are you having bleeding issues? no  4.  Do you need clearance to hold Coumadin? No    Please route to the Coumadin Clinic Pool

## 2020-01-27 ENCOUNTER — Ambulatory Visit (HOSPITAL_BASED_OUTPATIENT_CLINIC_OR_DEPARTMENT_OTHER)
Admission: RE | Admit: 2020-01-27 | Discharge: 2020-01-27 | Disposition: A | Discharge status: Home / Self Care | Payer: 59 | Source: Ambulatory Visit | Attending: Cardiology | Admitting: Cardiology

## 2020-01-27 ENCOUNTER — Other Ambulatory Visit: Payer: Self-pay

## 2020-01-27 ENCOUNTER — Inpatient Hospital Stay (HOSPITAL_COMMUNITY)
Admission: RE | Admit: 2020-01-27 | Discharge: 2020-01-27 | Disposition: A | Discharge status: Home / Self Care | Payer: Self-pay | Source: Ambulatory Visit

## 2020-01-27 ENCOUNTER — Ambulatory Visit (HOSPITAL_COMMUNITY)
Admission: RE | Admit: 2020-01-27 | Discharge: 2020-01-27 | Disposition: A | Discharge status: Home / Self Care | Payer: 59 | Source: Ambulatory Visit | Attending: Cardiology | Admitting: Cardiology

## 2020-01-27 VITALS — BP 130/64 | HR 88 | Wt 145.0 lb

## 2020-01-27 DIAGNOSIS — Z87891 Personal history of nicotine dependence: Secondary | ICD-10-CM | POA: Diagnosis not present

## 2020-01-27 DIAGNOSIS — Z8249 Family history of ischemic heart disease and other diseases of the circulatory system: Secondary | ICD-10-CM | POA: Insufficient documentation

## 2020-01-27 DIAGNOSIS — I739 Peripheral vascular disease, unspecified: Secondary | ICD-10-CM

## 2020-01-27 DIAGNOSIS — Z86718 Personal history of other venous thrombosis and embolism: Secondary | ICD-10-CM | POA: Diagnosis not present

## 2020-01-27 DIAGNOSIS — I5022 Chronic systolic (congestive) heart failure: Secondary | ICD-10-CM

## 2020-01-27 DIAGNOSIS — Z7901 Long term (current) use of anticoagulants: Secondary | ICD-10-CM | POA: Insufficient documentation

## 2020-01-27 DIAGNOSIS — Z8673 Personal history of transient ischemic attack (TIA), and cerebral infarction without residual deficits: Secondary | ICD-10-CM | POA: Insufficient documentation

## 2020-01-27 DIAGNOSIS — I428 Other cardiomyopathies: Secondary | ICD-10-CM | POA: Insufficient documentation

## 2020-01-27 DIAGNOSIS — I82431 Acute embolism and thrombosis of right popliteal vein: Secondary | ICD-10-CM | POA: Insufficient documentation

## 2020-01-27 DIAGNOSIS — Z79899 Other long term (current) drug therapy: Secondary | ICD-10-CM | POA: Diagnosis not present

## 2020-01-27 DIAGNOSIS — I2510 Atherosclerotic heart disease of native coronary artery without angina pectoris: Secondary | ICD-10-CM | POA: Insufficient documentation

## 2020-01-27 DIAGNOSIS — I240 Acute coronary thrombosis not resulting in myocardial infarction: Secondary | ICD-10-CM

## 2020-01-27 LAB — BASIC METABOLIC PANEL
Anion gap: 10 (ref 5–15)
BUN: 26 mg/dL — ABNORMAL HIGH (ref 6–20)
CO2: 24 mmol/L (ref 22–32)
Calcium: 9.3 mg/dL (ref 8.9–10.3)
Chloride: 98 mmol/L (ref 98–111)
Creatinine, Ser: 0.99 mg/dL (ref 0.44–1.00)
GFR calc Af Amer: >60 mL/min (ref >60)
GFR calc non Af Amer: >60 mL/min (ref >60)
Glucose, Bld: 100 mg/dL — ABNORMAL HIGH (ref 70–99)
Potassium: 4.3 mmol/L (ref 3.5–5.1)
Sodium: 132 mmol/L — ABNORMAL LOW (ref 135–145)

## 2020-01-27 MED ORDER — CARVEDILOL 6.25 MG PO TABS: 6.2500 mg | ORAL_TABLET | Freq: Two times a day (BID) | ORAL | 4 refills | Status: DC

## 2020-01-27 MED ORDER — ICOSAPENT ETHYL 1 G PO CAPS: 2.0000 g | ORAL_CAPSULE | Freq: Two times a day (BID) | ORAL | 5 refills | Status: DC

## 2020-01-27 NOTE — Patient Instructions (Addendum)
STOP Digoxin   INCREASE Coreg to 6.25mg  (1 tab) twice a day   START Vascepa 2g twice a day   Labs today and repeat in 2 months We will only contact you if something comes back abnormal or we need to make some changes. Otherwise no news is good news!   Your physician recommends that you schedule a follow-up appointment in: 6 weeks with Dr Shirlee Latch    Please call office at [phone] option 2 if you have any questions or concerns.    At the [facility], you and your health needs are our priority. As part of our continuing mission to provide you with exceptional heart care, we have created designated Provider Care Teams. These Care Teams include your primary Cardiologist (physician) and Advanced Practice Providers (APPs- Physician Assistants and Nurse Practitioners) who all work together to provide you with the care you need, when you need it.   You may see any of the following providers on your designated Care Team at your next follow up: Marland Kitchen Dr Arvilla Meres . Dr Marca Ancona . Tonye Becket, NP . Robbie Lis, PA . Karle Plumber, PharmD   Please be sure to bring in all your medications bottles to every appointment.

## 2020-01-27 NOTE — Progress Notes (Signed)
  Echocardiogram 2D Echocardiogram has been performed.  Burnard Hawthorne 01/27/2020, 2:48 PM

## 2020-01-28 NOTE — Progress Notes (Signed)
PCP: None  Cardiologist: Dr Shirlee Latch Vascular : Dr Darrick Penna  Ortho: Dr Lajoyce Corners  [facility]   HPI: Briana Andrews is a 54 y.o. alcoholic woman with a history of R frontal CVA, B popliteal artery occlusions, hepatic failure, GI bleeding, cirrhosis, ETOH abuse, and chronic systolic heart failure.  Admitted 08/15/19 with increased sob and confusion. Treated for PNA and MRI brain showed R ACA stroke. Hospital course complicated new onset acute systolic heart failure possibly due to ETOH abuse. Hospital course complicated by LV thrombus, hepatitis, ARF, lower extremity cardioembolism, and shock.  She had suspected cardioembolism to bilateral popliteal arteries and underwent mechanical thrombectomy.  Received PRBCs and was able to undergo EGD 9/29 that showed duodenal and gastric ulcers which were treated endoscopically. Continued on PPI. Heparin has been converted to coumadin. R foot wound was to be followed by Vascular in the community  Discharged to SNF. On 09/21/19 she presented to the [facility] after a fall that resulted in head laceration. CT scan was unremarkable. She left SNF on 09/27/19 because she was not taken to any of her follow up appointments.    She has had follow up with VVS/Ortho for R foot wound and had debridement 10/14/19.   Echo was done today and reviewed.  EF increased to 45-50%, mild LV dilation, normal RV.   Today she returns for followup of CHF.  Legs are healing and she is walking more with a walker.  No exertional dyspnea or chest pain with her usual activities.  No lightheadedness. She is staying off ETOH.   Labs (11/20): K 4.2, creatinine 0.97 Labs (12/20): LDL 55, TGs 259 Labs (3/21): K 4.3, 0.99  1. Chronic systolic CHF: Primarily nonischemic cardiomyopathy.   - Echo (9/20): EF 15-20% with LV apical thrombus. Severely decreased RV function.  - RHC (10/20): RA 6, CI 3.7, PVR 3.7  - LHC (10/20): LAD 40%, Left Circumflex 50% , 80% proximal RCA, 60% mid PDA stenosis - Echo  (3/21): EF increased to 45-50%, mild LV dilation, normal RV.  2. GI Bleed: Gastric and duodenal ulcers in 9/20.  3. R frontal CVA: In 9/20, due to LV thrombus.  4. 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   - ABIs (11/20): Normal.  5. 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  Liver imaging did not show cirrhosis or portal vein thrombosis. RV failure/congestive hepatopathy may have played a role.  6. ETOH Abuse  7. LV thrombus 8. CAD: LHC 10/20 LAD 40%, Left Circumflex 50% , RCA 80% Proximal RCA 60% mid PDA stenosis.  9. DVT: Right popliteal vein in 9/20.   ROS: All systems negative except as listed in HPI, PMH and Problem List.  SH:  Social History   Socioeconomic History  . Marital status: Married    Spouse name: Not on file  . Number of children: Not on file  . Years of education: Not on file  . Highest education level: Not on file  Occupational History  . Not on file  Tobacco Use  . Smoking status: Former Smoker    Years: 39.00    Quit date: 06/2019    Years since quitting: 0.5  . Smokeless tobacco: Never Used  Substance and Sexual Activity  . Alcohol use: Not Currently    Comment: alcoholism - last drink in august 2020  . Drug use: Not Currently    Types: Marijuana    Comment: none since August 2020  . Sexual activity: Not on file  Other Topics  Concern  . Not on file  Social History Narrative  . Not on file   Social Determinants of Health   Financial Resource Strain:   . Difficulty of Paying Living Expenses: Not on file  Food Insecurity:   . Worried About Programme researcher, broadcasting/film/video in the Last Year: Not on file  . Ran Out of Food in the Last Year: Not on file  Transportation Needs:   . Lack of Transportation (Medical): Not on file  . Lack of Transportation (Non-Medical): Not on file  Physical Activity:   . Days of Exercise per Week: Not on file  . Minutes of Exercise per Session: Not on file  Stress:   . Feeling of Stress  : Not on file  Social Connections:   . Frequency of Communication with Friends and Family: Not on file  . Frequency of Social Gatherings with Friends and Family: Not on file  . Attends Religious Services: Not on file  . Active Member of Clubs or Organizations: Not on file  . Attends Banker Meetings: Not on file  . Marital Status: Not on file  Intimate Partner Violence:   . Fear of Current or Ex-Partner: Not on file  . Emotionally Abused: Not on file  . Physically Abused: Not on file  . Sexually Abused: Not on file    FH:  Family History  Problem Relation Age of Onset  . Heart disease Mother     Current Outpatient Medications  Medication Sig Dispense Refill  . carvedilol (COREG) 6.25 MG tablet Take 1 tablet (6.25 mg total) by mouth 2 (two) times daily with a meal. 60 tablet 4  . folic acid (FOLVITE) 1 MG tablet Take 1 mg by mouth daily.    . furosemide (LASIX) 20 MG tablet Take 1 tablet (20 mg total) by mouth daily as needed (fluid retention.). 30 tablet 11  . ibuprofen (ADVIL) 200 MG tablet Take 200 mg by mouth every 6 (six) hours as needed for headache or moderate pain.    Marland Kitchen icosapent Ethyl (VASCEPA) 1 g capsule Take 2 capsules (2 g total) by mouth 2 (two) times daily. 120 capsule 5  . pantoprazole (PROTONIX) 40 MG tablet Take 40 mg by mouth daily.    . rosuvastatin (CRESTOR) 5 MG tablet Take 5 mg by mouth daily.    . sacubitril-valsartan (ENTRESTO) 24-26 MG Take 1 tablet by mouth 2 (two) times daily. 60 tablet 5  . silver sulfADIAZINE (SILVADENE) 1 % cream Apply 1 application topically every other day.    . spironolactone (ALDACTONE) 25 MG tablet Take 1 tablet (25 mg total) by mouth daily. 90 tablet 3  . traMADol (ULTRAM) 50 MG tablet Take 1 tablet (50 mg total) by mouth every 6 (six) hours as needed for moderate pain. 30 tablet 0  . warfarin (COUMADIN) 2.5 MG tablet Take as directed by the anti-coag clinic. 60 tablet 0   No current facility-administered  medications for this encounter.    Vitals:   01/27/20 1447  BP: 130/64  Pulse: 88  SpO2: 100%  Weight: 65.8 kg (145 lb)   Wt Readings from Last 3 Encounters:  01/27/20 65.8 kg (145 lb)  01/08/20 64.9 kg (143 lb)  01/06/20 64.9 kg (143 lb)    PHYSICAL EXAM: General: NAD Neck: No JVD, no thyromegaly or thyroid nodule.  Lungs: Clear to auscultation bilaterally with normal respiratory effort. CV: Nondisplaced PMI.  Heart regular S1/S2, no S3/S4, no murmur.  No peripheral edema.  No  carotid bruit.  Normal pedal pulses.  Abdomen: Soft, nontender, no hepatosplenomegaly, no distention.  Skin: Intact without lesions or rashes.  Neurologic: Alert and oriented x 3.  Psych: Normal affect. Extremities: No clubbing or cyanosis.  HEENT: Normal.   ASSESSMENT & PLAN: 1. Chronic systolic CHF:  Cardiogenic shock in 9/20, echo 07/2019 with EF 15-20%, mild LV dilation, LV thrombus, mild-moderate MR, severely decreased RV systolic function.  LHC/RHC done 10/20 showing normal filling pressures and preserved cardiac output.  There was diffuse coronary disease, especially involving distal and branch vessels, only possible interventional target was 80% proximal RCA stenosis.  No intervention given good flow down the vessel and unlikely to significantly improve LV function.  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 Echo today showed EF up to 45-50%.  NYHA class 2, no volume overload on exam.  - Can stop digoxin with rise in EF.  - Increase Coreg to 6.25 mg bid.   - Continue Entresto 24/26 bid.  - Continue spironolactone 25 mg daily.  - Continue to abstain from ETOH.  2. Suspected ETOH hepatitis: Elevated LFTs during 9/20 admission.  However liver imaging with [place] and CT has NOT shown cirrhosis or portal/hepatic vein thrombosis. Question has arisen whether this might possibly be due primarily to RV failure.  - Needs followup with GI. 3.   LV thrombus: Possible cause of embolic event to popliteal arteries as well as subacute CVA.  - Continue warfarin.  If EF continues to rise, can likely stop in future.  4. PAD: Bilateral popliteal artery occlusions, suspect cardioembolic from LV thrombus. Now s/p mechanical thrombectomy by vascular.  Right foot wound followed by Dr. Sharol Given.   5. CVA: Subacute right ACA CVA in 9/20. Likely from LV thrombus.  - Continue anticoagulation.  6. Right popliteal vein DVT: 9/20.  - Continue warfarin.   7. H/O GI bleeding: Upper GI bleeding, EGD with bleeding duodenal and gastric ulcer in 9/20 that were treated, no varices.  - Continue Protonix - Followup with GI.  8. H/O ETOH abuse: She has quit drinking now since August 2020.  Hopefully this will help LV function.   9. CAD: Extensive distal and branch vessel coronary disease on cath 10/20.  Only possible interventional target was 80% proximal RCA stenosis.  owever, there was good flow down the RCA and I do not think intervention on this lesion would appreciably improve her LV function.  No chest pain.  - Continue Crestor 5 mg daily.  Good LDL in 1/21.   - Triglycerides high, start Vascepa 2 g bid with lipids in 2 months.  - No ASA given warfarin use.   Briana Andrews 01/28/2020

## 2020-01-29 ENCOUNTER — Encounter: Payer: Self-pay | Admitting: Physician Assistant

## 2020-01-29 ENCOUNTER — Ambulatory Visit (INDEPENDENT_AMBULATORY_CARE_PROVIDER_SITE_OTHER): Payer: 59 | Admitting: Physician Assistant

## 2020-01-29 ENCOUNTER — Other Ambulatory Visit: Payer: Self-pay

## 2020-01-29 VITALS — Ht 64.0 in | Wt 145.0 lb

## 2020-01-29 DIAGNOSIS — L02611 Cutaneous abscess of right foot: Secondary | ICD-10-CM

## 2020-01-29 NOTE — Progress Notes (Signed)
Office Visit Note   Patient: Briana Andrews           Date of Birth: [birth year]           MRN: [id number] Visit Date: 01/29/2020              Requested by: No referring provider defined for this encounter. PCP: Patient, [name]  Chief Complaint  Patient presents with  . Right Foot - Follow-up    10/15/19 right foot debridement       HPI: This is a pleasant 55 year old woman who is a little over 3 months status post right foot debridement with skin grafting.  She is doing well.  She has been using her compression sock though cannot tolerated at night.  She feels she is making good progress  Assessment & Plan: Visit Diagnoses: No diagnosis found.  Plan: She should continue to wear her compression sock as much as possible.  She is supposed to be walking a little bit for cardio vascular reasons and I said she could walk short amounts and less it causes any damage to her her healing foot.  Follow-up in 3 weeks  Follow-Up Instructions: No follow-ups on file.   Ortho Exam  Patient is alert, oriented, no adenopathy, well-dressed, normal affect, normal respiratory effort. Focused examination of the foot she has a very healthy wound bed with some new skin ingrowth.  No surrounding cellulitis no odor no drainage  Imaging: No results found. No images are attached to the encounter.  Labs: Lab Results  Component Value Date   HGBA1C 5.8 (H) 08/17/2019   ESRSEDRATE 0 08/16/2019   REPTSTATUS 08/19/2019 FINAL 08/18/2019   CULT  08/18/2019    NO GROWTH Performed at [facility], [address], [place], [place] [address]      Lab Results  Component Value Date   ALBUMIN 3.1 (L) 11/25/2019   ALBUMIN 2.6 (L) 10/15/2019   ALBUMIN 1.2 (L) 09/05/2019    Lab Results  Component Value Date   MG 2.1 09/08/2019   MG 1.4 (L) 09/07/2019   MG 1.9 09/02/2019   No results found for: VD25OH  No results found for: PREALBUMIN CBC EXTENDED Latest Ref Rng & Units 11/25/2019  10/15/2019 09/29/2019  WBC 4.0 - 10.5 K/uL 8.7 7.4 8.3  RBC 3.87 - 5.11 MIL/uL 3.62(L) 3.38(L) 3.21(L)  HGB 12.0 - 15.0 g/dL 11.2(L) 10.6(L) 10.2(L)  HCT 36.0 - 46.0 % 34.9(L) 32.8(L) 31.4(L)  PLT 150 - 400 K/uL 449(H) 535(H) 680(H)  NEUTROABS 1.7 - 7.7 K/uL - - -  LYMPHSABS 0.7 - 4.0 K/uL - - -     Body mass index is 24.89 kg/m.  Orders:  No orders of the defined types were placed in this encounter.  No orders of the defined types were placed in this encounter.    Procedures: No procedures performed  Clinical Data: No additional findings.  ROS:  All other systems negative, except as noted in the HPI. Review of Systems  Objective: Vital Signs: Ht 5\' 4"  (1.626 m)   Wt 145 lb (65.8 kg)   BMI 24.89 kg/m   Specialty Comments:  No specialty comments available.  PMFS History: Patient Active Problem List   Diagnosis Date Noted  . Gangrene of right foot (HCC)   . Cutaneous abscess of right foot   . Endotracheally intubated   . Acute on chronic systolic CHF (congestive heart failure) (HCC)   . GI bleed 08/26/2019  . Pressure injury of  skin 08/23/2019  . Acute upper GI bleed   . Shock circulatory ([place])   . Palliative care by specialist   . Goals of care, counseling/discussion   . Advanced care planning/counseling discussion   . Jaundice   . Cerebral embolism with cerebral infarction 08/17/2019  . PVD (peripheral vascular disease) ([place]) 08/17/2019  . 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 Acute CHF (congestive heart failure) ([place]) 08/17/2019  . Abnormal LFTs 08/16/2019  . Acute metabolic encephalopathy 16/08/9603  . SOB (shortness of breath) 08/16/2019  . AKI (acute kidney injury) ([place]) 08/16/2019  . Fall 08/16/2019  . Lactic acidosis 08/16/2019  . Back pain 08/16/2019  . Liver failure without hepatic coma ([place]) 08/16/2019  . Multifocal pneumonia 08/16/2019  . Alcohol abuse   . Tobacco abuse   . Shortness of breath    Past Medical  History:  Diagnosis Date  . Alcohol abuse   . CHF (congestive heart failure) ([place])    last EF 35-40%. etoh induced  . Coronary artery disease    80%RCA, 50%LCX, 40%LAD  . Depression   . DVT (deep venous thrombosis) (HCC)    right popliteal vein (08/17/19)  . GERD (gastroesophageal reflux disease)   . Hyperlipidemia   . LV (left ventricular) mural thrombus   . Stroke [facility])    / memory loss  . Tobacco abuse     Family History  Problem Relation Age of Onset  . Heart disease Mother     Past Surgical History:  Procedure Laterality Date  . ABDOMINAL AORTOGRAM W/LOWER EXTREMITY N/A 08/19/2019   Procedure: ABDOMINAL AORTOGRAM W/LOWER EXTREMITY;  Surgeon: Serafina Mitchell, MD;  Location: [place] [facility];  Service: Cardiovascular;  Laterality: N/A;  . ABDOMINAL AORTOGRAM W/LOWER EXTREMITY Bilateral 08/20/2019   Procedure: ABDOMINAL AORTOGRAM W/LOWER EXTREMITY;  Surgeon: Marty Heck, MD;  Location: [place] [facility];  Service: Cardiovascular;  Laterality: Bilateral;  . DEBRIDEMENT  FOOT Right 10/15/2019  . ESOPHAGOGASTRODUODENOSCOPY N/A 08/26/2019   Procedure: ESOPHAGOGASTRODUODENOSCOPY (EGD);  Surgeon: Wilford Corner, MD;  Location: [place];  Service: Endoscopy;  Laterality: N/A;  . HOT HEMOSTASIS N/A 08/26/2019   Procedure: HOT HEMOSTASIS (ARGON PLASMA COAGULATION/BICAP);  Surgeon: Wilford Corner, MD;  Location: [place];  Service: Endoscopy;  Laterality: N/A;  . I & D EXTREMITY Right 10/15/2019   Procedure: RIGHT FOOT DEBRIDEMENT;  Surgeon: Newt Minion, MD;  Location: [place];  Service: Orthopedics;  Laterality: Right;  . PERIPHERAL VASCULAR BALLOON ANGIOPLASTY  08/19/2019   Procedure: PERIPHERAL VASCULAR BALLOON ANGIOPLASTY;  Surgeon: Serafina Mitchell, MD;  Location: [place] [facility];  Service: Cardiovascular;;  Right femoral popliteal and PT   . PERIPHERAL VASCULAR INTERVENTION Left 08/20/2019   Procedure: PERIPHERAL VASCULAR INTERVENTION;  Surgeon: Marty Heck, MD;  Location: [place] [facility];  Service: Cardiovascular;  Laterality: Left;  . RIGHT/LEFT HEART CATH AND CORONARY ANGIOGRAPHY N/A 09/02/2019   Procedure: RIGHT/LEFT HEART CATH AND CORONARY ANGIOGRAPHY;  Surgeon: Larey Dresser, MD;  Location: [place] [facility];  Service: Cardiovascular;  Laterality: N/A;  . SCLEROTHERAPY  08/26/2019   Procedure: SCLEROTHERAPY;  Surgeon: Wilford Corner, MD;  Location: [facility];  Service: Endoscopy;;   Social History   Occupational History  . Not on file  Tobacco Use  . Smoking status: Former Smoker    Years: 39.00    Quit date: 06/2019    Years since quitting: 0.5  . Smokeless tobacco: Never Used  Substance and Sexual Activity  .  Alcohol use: Not Currently    Comment: alcoholism - last drink in august 2020  . Drug use: Not Currently    Types: Marijuana    Comment: none since August 2020  . Sexual activity: Not on file

## 2020-02-09 ENCOUNTER — Ambulatory Visit (INDEPENDENT_AMBULATORY_CARE_PROVIDER_SITE_OTHER): Payer: 59

## 2020-02-09 ENCOUNTER — Other Ambulatory Visit: Payer: Self-pay

## 2020-02-09 DIAGNOSIS — I240 Acute coronary thrombosis not resulting in myocardial infarction: Secondary | ICD-10-CM | POA: Diagnosis not present

## 2020-02-09 DIAGNOSIS — Z5181 Encounter for therapeutic drug level monitoring: Secondary | ICD-10-CM | POA: Diagnosis not present

## 2020-02-09 LAB — POCT INR: INR: 1.9 — AB (ref 2.0–3.0)

## 2020-02-09 MED ORDER — WARFARIN SODIUM 5 MG PO TABS: ORAL_TABLET | ORAL | 1 refills | Status: DC

## 2020-02-09 NOTE — Patient Instructions (Signed)
-   Take 3 of the 2.5 mg tablets tonight then - continue dosage of warfarin 5 mg EVERY DAY (take 2 of the 2.5 mg tablets until you pick up refill of 5 mg tablets) - Recheck in 4 weeks.

## 2020-02-25 ENCOUNTER — Telehealth: Payer: Self-pay | Admitting: *Deleted

## 2020-02-25 NOTE — Telephone Encounter (Signed)
Would bridge with Lovenox.

## 2020-02-25 NOTE — Telephone Encounter (Signed)
   [facility] Pre-operative Risk Assessment    Request for surgical clearance:  1. What type of surgery is being performed? COLONOSCOPY AND ENDOSCOPY   2. When is this surgery scheduled? 04/02/20   3. What type of clearance is required (medical clearance vs. Pharmacy clearance to hold med vs. Both)? PHARM  4. Are there any medications that need to be held prior to surgery and how long? COUMADIN   5. Practice name and name of physician performing surgery? [facility]; DR. Michail Sermon   6. What is your office phone number [phone]    7.   What is your office fax number [phone]  8.   Anesthesia type (None, local, MAC, general) ? NOT LISTED; PROPOFOL?   Julaine Hua 02/25/2020, 11:47 AM  _________________________________________________________________   (provider comments below)

## 2020-02-25 NOTE — Telephone Encounter (Signed)
Pt takes warfarin for LV apical thrombus, DVT, stroke, DVT in bilateral popliteal arteries s/p mechanical thrombectomy, history of GI bleed, all of which occurred during Sept 2020 admission. No mention of thrombus on 01/27/20 echo.  Will route to Dr Shirlee Latch to weigh in on potential need for Lovenox bridging if warfarin is held for 5 days for endoscopy and colonoscopy.

## 2020-02-25 NOTE — Telephone Encounter (Signed)
Sent to pharmacy for recommendations on  Coumadin cessation and timing.

## 2020-02-26 ENCOUNTER — Telehealth: Payer: Self-pay | Admitting: Cardiology

## 2020-02-26 NOTE — Telephone Encounter (Signed)
Patient states she is returning a call - not sure if it is in regards to her medical clearance. I don't see any notes about someone calling her.

## 2020-02-26 NOTE — Telephone Encounter (Signed)
   Primary Cardiologist: Debbe Odea, MD  Chart reviewed as part of pre-operative protocol coverage. Given past medical history and time since last visit, based on ACC/AHA guidelines, Briana Andrews would be at acceptable risk for the planned procedure without further cardiovascular testing.   She does need coumadin crossover for the colonoscopy and EGD.  This will be done through our [place] office.     I will route this recommendation to the requesting party via Epic fax function and remove from pre-op pool.  Please call with questions.  Nada Boozer, NP 02/26/2020, 1:39 PM

## 2020-02-26 NOTE — Telephone Encounter (Signed)
Will bridge with Lovenox while holding warfarin for 5 days prior to procedure. Pt is followed in [place] office where bridge will be coordinated.

## 2020-03-02 ENCOUNTER — Ambulatory Visit (INDEPENDENT_AMBULATORY_CARE_PROVIDER_SITE_OTHER): Payer: 59 | Admitting: Physician Assistant

## 2020-03-02 ENCOUNTER — Other Ambulatory Visit: Payer: Self-pay

## 2020-03-02 ENCOUNTER — Encounter: Payer: Self-pay | Admitting: Physician Assistant

## 2020-03-02 VITALS — Ht 64.0 in | Wt 145.0 lb

## 2020-03-02 DIAGNOSIS — I96 Gangrene, not elsewhere classified: Secondary | ICD-10-CM

## 2020-03-02 NOTE — Progress Notes (Signed)
Office Visit Note   Patient: Briana Andrews           Date of Birth: 1965/12/01           MRN: [id number] Visit Date: 03/02/2020              Requested by: No referring provider defined for this encounter. PCP: Patient, [name]  Chief Complaint  Patient presents with  . Right Foot - Follow-up    10/15/19 right foot debridement       HPI: She presents today 5 months status post right foot debridement and skin grafting.  She is doing extremely well.  She sees improvement every week.  She has been doing dry dressing changes  Assessment & Plan: Visit Diagnoses: No diagnosis found.  Plan: She continues to improve.  She should follow-up in 1 month continue dressing changes as scheduled.  Follow-Up Instructions: No follow-ups on file.   Ortho Exam  Patient is alert, oriented, no adenopathy, well-dressed, normal affect, normal respiratory effort.  Focused examination of the dorsum of her right foot she has excellent healthy granulation tissue.  Healthy skin ingrowth.  Swelling is well controlled no foul odor minimal drainage  Imaging: No results found. No images are attached to the encounter.  Labs: Lab Results  Component Value Date   HGBA1C 5.8 (H) 08/17/2019   ESRSEDRATE 0 08/16/2019   REPTSTATUS 08/19/2019 FINAL 08/18/2019   CULT  08/18/2019    NO GROWTH Performed at [facility], [place] [address] [place], [place] [address]      Lab Results  Component Value Date   ALBUMIN 3.1 (L) 11/25/2019   ALBUMIN 2.6 (L) 10/15/2019   ALBUMIN 1.2 (L) 09/05/2019    Lab Results  Component Value Date   MG 2.1 09/08/2019   MG 1.4 (L) 09/07/2019   MG 1.9 09/02/2019   No results found for: VD25OH  No results found for: PREALBUMIN CBC EXTENDED Latest Ref Rng & Units 11/25/2019 10/15/2019 09/29/2019  WBC 4.0 - 10.5 K/uL 8.7 7.4 8.3  RBC 3.87 - 5.11 MIL/uL 3.62(L) 3.38(L) 3.21(L)  HGB 12.0 - 15.0 g/dL 11.2(L) 10.6(L) 10.2(L)  HCT 36.0 - 46.0 % 34.9(L) 32.8(L) 31.4(L)   PLT 150 - 400 K/uL 449(H) 535(H) 680(H)  NEUTROABS 1.7 - 7.7 K/uL - - -  LYMPHSABS 0.7 - 4.0 K/uL - - -     Body mass index is 24.89 kg/m.  Orders:  No orders of the defined types were placed in this encounter.  No orders of the defined types were placed in this encounter.    Procedures: No procedures performed  Clinical Data: No additional findings.  ROS:  All other systems negative, except as noted in the HPI. Review of Systems  Objective: Vital Signs: Ht 5\' 4"  (1.626 m)   Wt 145 lb (65.8 kg)   BMI 24.89 kg/m   Specialty Comments:  No specialty comments available.  PMFS History: Patient Active Problem List   Diagnosis Date Noted  . Gangrene of right foot ([place])   . Cutaneous abscess of right foot   . Endotracheally intubated   . Acute on chronic systolic CHF (congestive heart failure) ([place])   . GI bleed 08/26/2019  . Pressure injury of skin 08/23/2019  . Acute upper GI bleed   . Shock circulatory ([place])   . Palliative care by specialist   . Goals of care, counseling/discussion   . Advanced care planning/counseling discussion   . Jaundice   . Cerebral  embolism with cerebral infarction 08/17/2019  . PVD (peripheral vascular disease) (HCC) 08/17/2019  . 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 Acute CHF (congestive heart failure) (HCC) 08/17/2019  . Abnormal LFTs 08/16/2019  . Acute metabolic encephalopathy 08/16/2019  . SOB (shortness of breath) 08/16/2019  . AKI (acute kidney injury) (HCC) 08/16/2019  . Fall 08/16/2019  . Lactic acidosis 08/16/2019  . Back pain 08/16/2019  . Liver failure without hepatic coma (HCC) 08/16/2019  . Multifocal pneumonia 08/16/2019  . Alcohol abuse   . Tobacco abuse   . Shortness of breath    Past Medical History:  Diagnosis Date  . Alcohol abuse   . CHF (congestive heart failure) (HCC)    last EF 35-40%. etoh induced  . Coronary artery disease    80%RCA, 50%LCX, 40%LAD  . Depression   .  DVT (deep venous thrombosis) (HCC)    right popliteal vein (08/17/19)  . GERD (gastroesophageal reflux disease)   . Hyperlipidemia   . LV (left ventricular) mural thrombus   . Stroke [facility])    / memory loss  . Tobacco abuse     Family History  Problem Relation Age of Onset  . Heart disease Mother     Past Surgical History:  Procedure Laterality Date  . ABDOMINAL AORTOGRAM W/LOWER EXTREMITY N/A 08/19/2019   Procedure: ABDOMINAL AORTOGRAM W/LOWER EXTREMITY;  Surgeon: Nada Libman, MD;  [facility] LAB;  Service: Cardiovascular;  Laterality: N/A;  . ABDOMINAL AORTOGRAM W/LOWER EXTREMITY Bilateral 08/20/2019   Procedure: ABDOMINAL AORTOGRAM W/LOWER EXTREMITY;  Surgeon: Cephus Shelling, MD;  [facility]: [facility] LAB;  Service: Cardiovascular;  Laterality: Bilateral;  . DEBRIDEMENT  FOOT Right 10/15/2019  . ESOPHAGOGASTRODUODENOSCOPY N/A 08/26/2019   Procedure: ESOPHAGOGASTRODUODENOSCOPY (EGD);  Surgeon: Charlott Rakes, MD;  Location[facility] ENDOSCOPY;  Service: Endoscopy;  Laterality: N/A;  . HOT HEMOSTASIS N/A 08/26/2019   Procedure: HOT HEMOSTASIS (ARGON PLASMA COAGULATION/BICAP);  Surgeon: Charlott Rakes, MD;  Location: [facility] ENDOSCOPY;  Service: Endoscopy;  Laterality: N/A;  . I & D EXTREMITY Right 10/15/2019   Procedure: RIGHT FOOT DEBRIDEMENT;  Surgeon: Nadara Mustard, MD;  Location: [facility] OR;  Service: Orthopedics;  Laterality: Right;  . PERIPHERAL VASCULAR BALLOON ANGIOPLASTY  08/19/2019   Procedure: PERIPHERAL VASCULAR BALLOON ANGIOPLASTY;  Surgeon: Nada Libman, MD;  [facility];  Service: Cardiovascular;;  Right femoral popliteal and PT   . PERIPHERAL VASCULAR INTERVENTION Left 08/20/2019   Procedure: PERIPHERAL VASCULAR INTERVENTION;  Surgeon: Cephus Shelling, MD;  Location: [facility] INVASIVE CV LAB;  Service: Cardiovascular;  Laterality: Left;  . RIGHT/LEFT HEART CATH AND CORONARY ANGIOGRAPHY N/A 09/02/2019   Procedure: RIGHT/LEFT HEART CATH AND  CORONARY ANGIOGRAPHY;  Surgeon: Laurey Morale, MD;  Location: [facility] INVASIVE CV LAB;  Service: Cardiovascular;  Laterality: N/A;  . SCLEROTHERAPY  08/26/2019   Procedure: SCLEROTHERAPY;  Surgeon: Charlott Rakes, MD;  Location: [facility] ENDOSCOPY;  Service: Endoscopy;;   Social History   Occupational History  . Not on file  Tobacco Use  . Smoking status: Former Smoker    Years: 39.00    Quit date: 06/2019    Years since quitting: 0.6  . Smokeless tobacco: Never Used  Substance and Sexual Activity  . Alcohol use: Not Currently    Comment: alcoholism - last drink in august 2020  . Drug use: Not Currently    Types: Marijuana    Comment: none since August 2020  . Sexual activity: Not on file

## 2020-03-08 ENCOUNTER — Other Ambulatory Visit: Payer: Self-pay

## 2020-03-08 ENCOUNTER — Ambulatory Visit (INDEPENDENT_AMBULATORY_CARE_PROVIDER_SITE_OTHER): Payer: 59

## 2020-03-08 DIAGNOSIS — I240 Acute coronary thrombosis not resulting in myocardial infarction: Secondary | ICD-10-CM

## 2020-03-08 DIAGNOSIS — Z5181 Encounter for therapeutic drug level monitoring: Secondary | ICD-10-CM

## 2020-03-08 LAB — POCT INR: INR: 1.9 — AB (ref 2.0–3.0)

## 2020-03-08 NOTE — Patient Instructions (Addendum)
-   START NEW dosage of warfarin 5 mg every day EXCEPT 1.5 tablets on MONDAYS & FRIDAYS.  - Recheck on May 12.  FOLLOW LOVENOX BRIDGE INSTRUCTIONS:  Saturday, May 1: Last dose of warfarin.  Sunday, May 2: No warfarin or Lovenox.  Monday, May 3: Inject Lovenox 60 mg in the fatty abdominal tissue at least 2 inches from the belly button twice a day about 12 hours apart, 8am and 8pm rotate sites. No warfarin.  Tuesday, May 4: Inject Lovenox in the fatty tissue every 12 hours, 8am and 8pm. No warfarin.  Wednesday, May 5: Inject Lovenox in the fatty tissue every 12 hours, 8am and 8pm. No warfarin.  Thursday, May 6: Inject Lovenox in the fatty tissue in the morning at 8 am (No PM dose). No warfarin.  Friday, May 7: Procedure Day - No Lovenox - Resume warfarin in the evening or as directed by doctor (take an extra half tablet with usual dose for 2 days then resume normal dose).  Saturday, May 8: Resume Lovenox inject in the fatty tissue every 12 hours and take warfarin (with extra 1/2 dose)  Sunday, May 9: Inject Lovenox in the fatty tissue every 12 hours and take warfarin  Monday, May 10: Inject Lovenox in the fatty tissue every 12 hours and take warfarin.  Tuesday, May 11: Inject Lovenox in the fatty tissue every 12 hours and take warfarin.  Wednesday, May 12: Inject Lovenox in the fatty tissue in AM, appt to check INR.

## 2020-03-08 NOTE — Progress Notes (Signed)
Saturday, May 1: Last dose of warfarin.  Sunday, May 2: No warfarin or Lovenox.  Monday, May 3: Inject Lovenox 60 mg in the fatty abdominal tissue at least 2 inches from the belly button twice a day about 12 hours apart, 8am and 8pm rotate sites. No warfarin.  Tuesday, May 4: Inject Lovenox in the fatty tissue every 12 hours, 8am and 8pm. No warfarin.  Wednesday, May 5: Inject Lovenox in the fatty tissue every 12 hours, 8am and 8pm. No warfarin.  Thursday, May 6: Inject Lovenox in the fatty tissue in the morning at 8 am (No PM dose). No warfarin.  Friday, May 7: Procedure Day - No Lovenox - Resume warfarin in the evening or as directed by doctor (take an extra half tablet with usual dose for 2 days then resume normal dose).  Saturday, May 8: Resume Lovenox inject in the fatty tissue every 12 hours and take warfarin (with extra 1/2 dose)  Sunday, May 9: Inject Lovenox in the fatty tissue every 12 hours and take warfarin  Monday, May 10: Inject Lovenox in the fatty tissue every 12 hours and take warfarin.  Tuesday, May 11: Inject Lovenox in the fatty tissue every 12 hours and take warfarin.  Wednesday, May 12: Inject Lovenox in the fatty tissue in AM, appt to check INR.

## 2020-03-09 IMAGING — DX DG CHEST 1V PORT
1 series · 1 of 1 positions shown · non-contrast
Comparison: 08/23/2019

CLINICAL DATA: Intubation.

EXAM:
PORTABLE CHEST 1 VIEW

[chest]
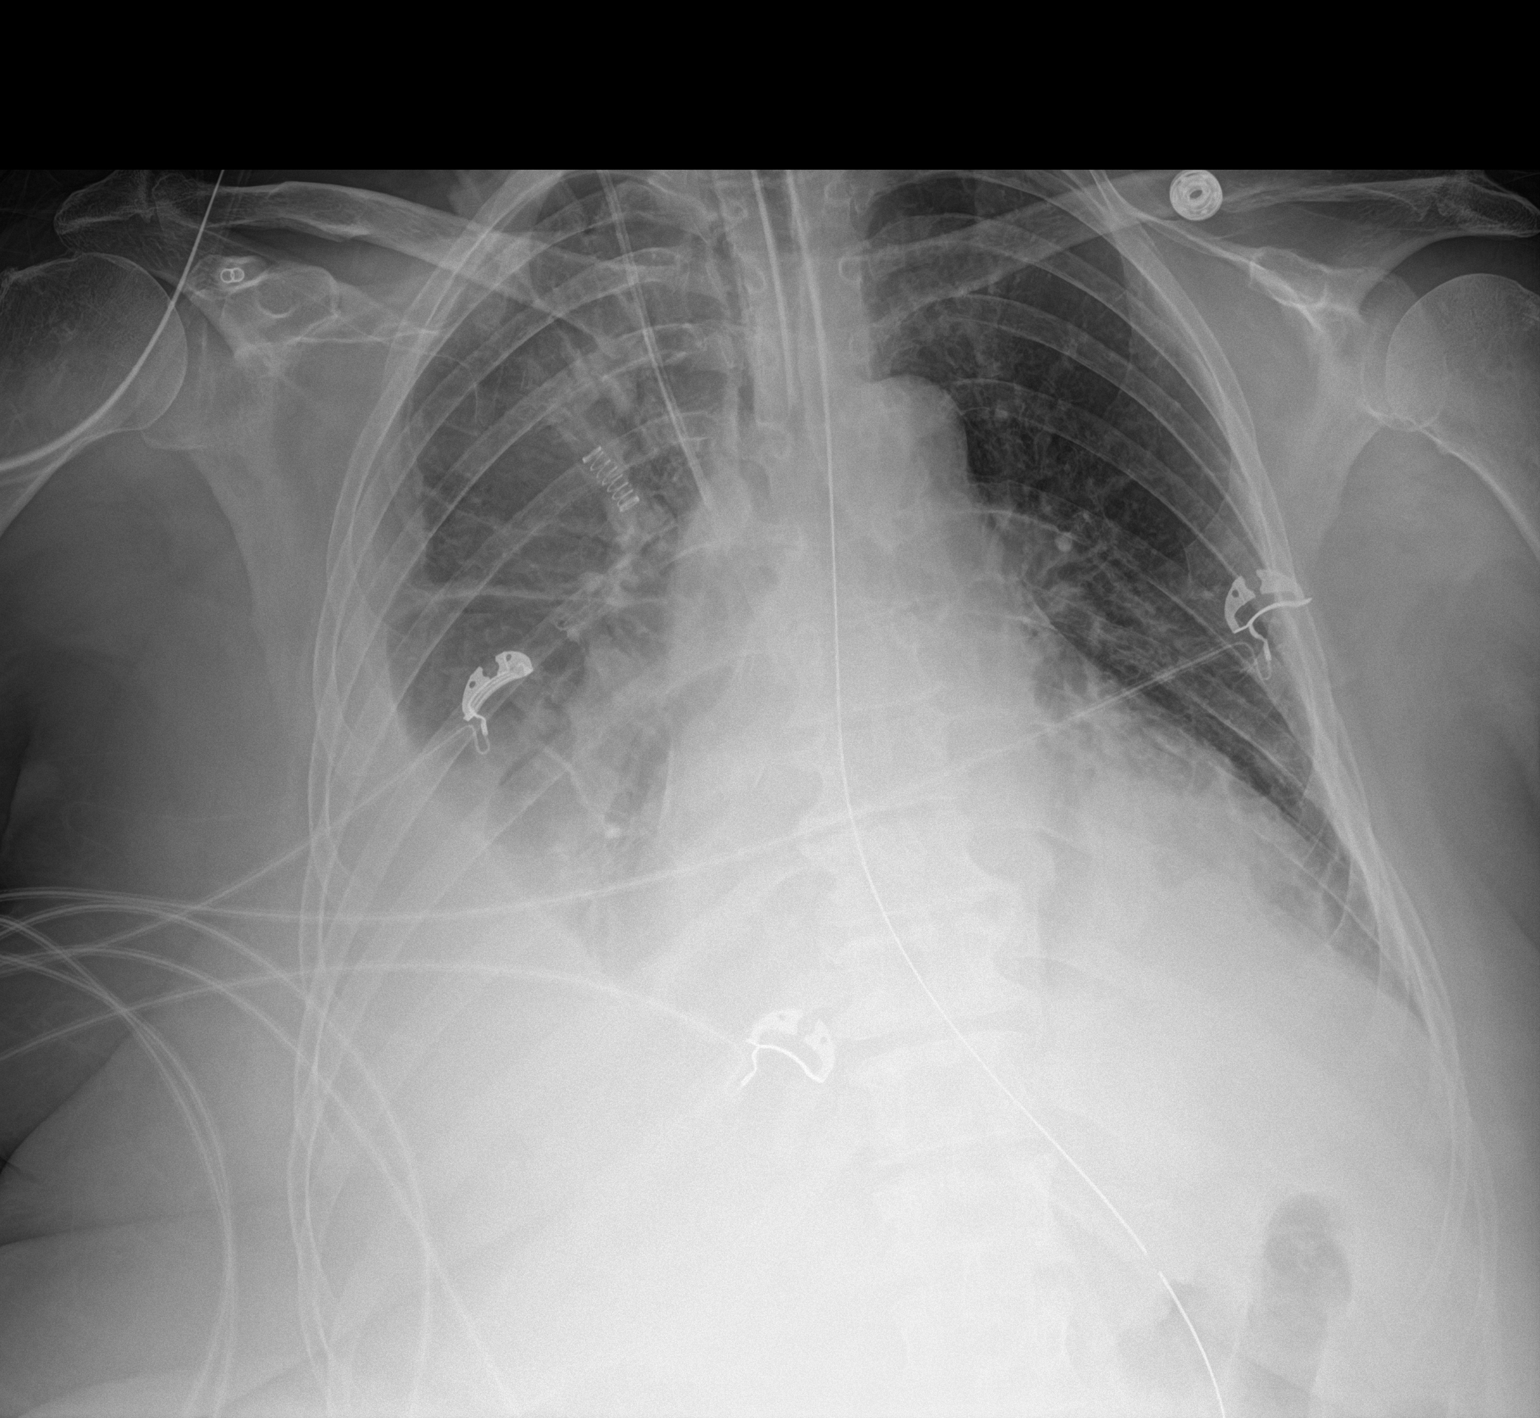

[1 of 1 positions shown; findings below may reference images not displayed]

FINDINGS: Patient is rotated to the right. Endotracheal tube has tip 2.9 cm
above the carina. Enteric tube courses into the stomach and off the
film as tip is not visualized. Right IJ central venous catheter
unchanged with tip over the SVC.

Persistent moderate size right pleural effusion and small left
pleural effusion likely with associated bibasilar atelectasis.
Infection in the lung bases is possible. Stable cardiomegaly.
Remainder of the exam is unchanged.
IMPRESSION: Stable moderate right effusion and small left effusion likely with
associated bibasilar atelectasis. Infection in the lung bases is
possible.

Stable cardiomegaly.

Tubes and lines as described.

## 2020-03-11 ENCOUNTER — Telehealth (HOSPITAL_COMMUNITY): Payer: Self-pay

## 2020-03-11 NOTE — Telephone Encounter (Signed)
Received clearance form from [facility], completed and faxed back. Confirmation received. Original scanned into chart.

## 2020-03-15 MED ORDER — ENOXAPARIN SODIUM 60 MG/0.6ML ~~LOC~~ SOLN: 60.0000 mg | Freq: Two times a day (BID) | SUBCUTANEOUS | 1 refills | Status: DC

## 2020-03-18 IMAGING — US US PELVIS COMPLETE
1 series · 13 of 13 positions shown · non-contrast
Comparison: None

CLINICAL DATA: Vaginal bleeding for 2 days

EXAM:
TRANSABDOMINAL ULTRASOUND OF PELVIS
TECHNIQUE: Transabdominal ultrasound examination of the pelvis was performed
including evaluation of the uterus, ovaries, adnexal regions, and
pelvic cul-de-sac. Patient refused transvaginal imaging.

[Series 1: us pelvis complete · 13 acquisitions, 13 frames shown]
[im 1/13]
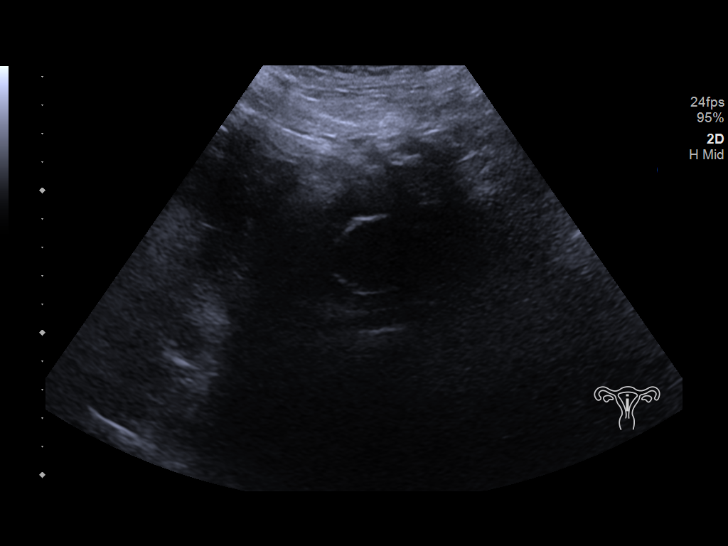
[im 2/13]
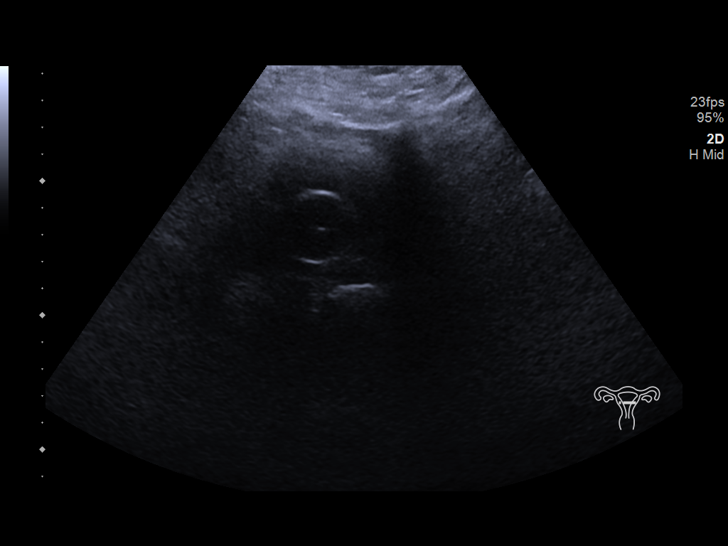
[im 3/13]
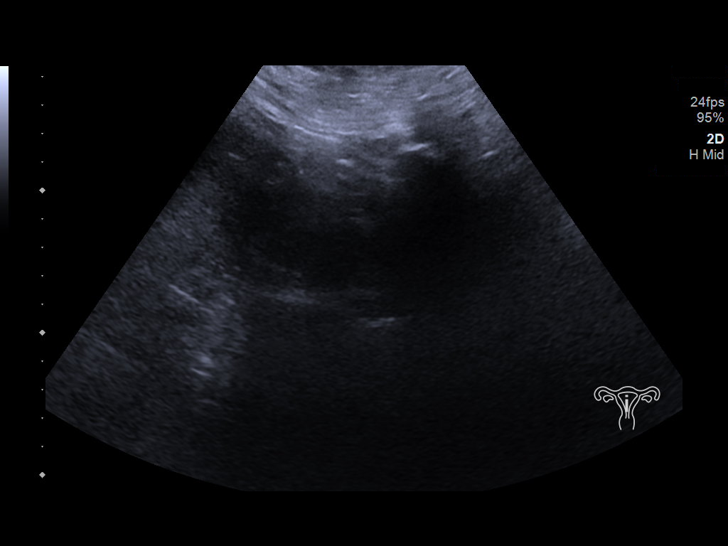
[im 4/13]
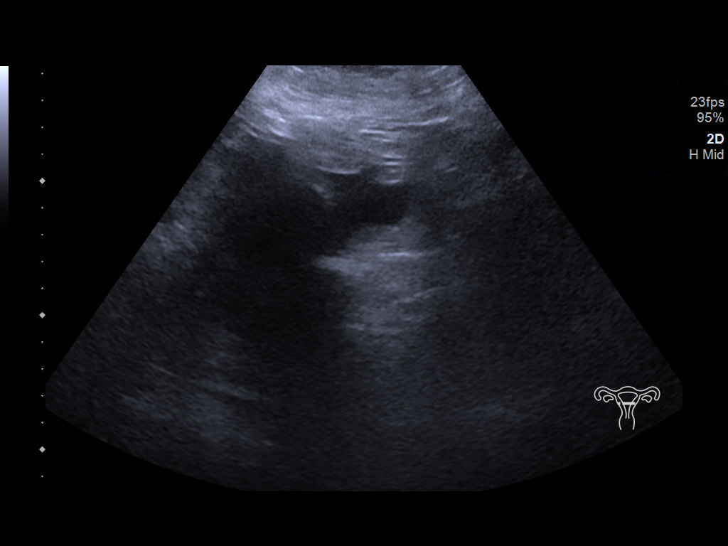
[im 5/13]
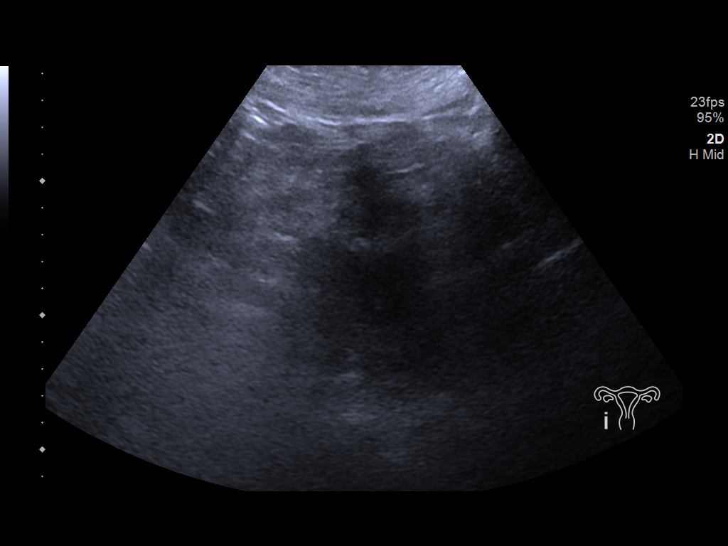
[im 6/13]
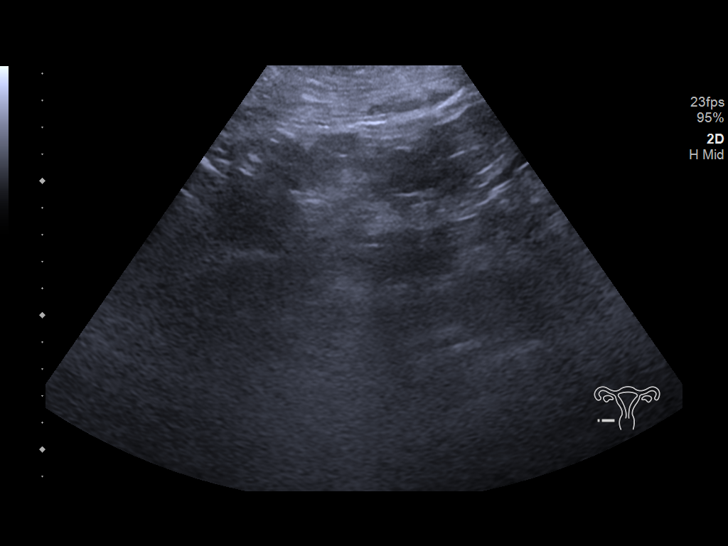
[im 7/13]
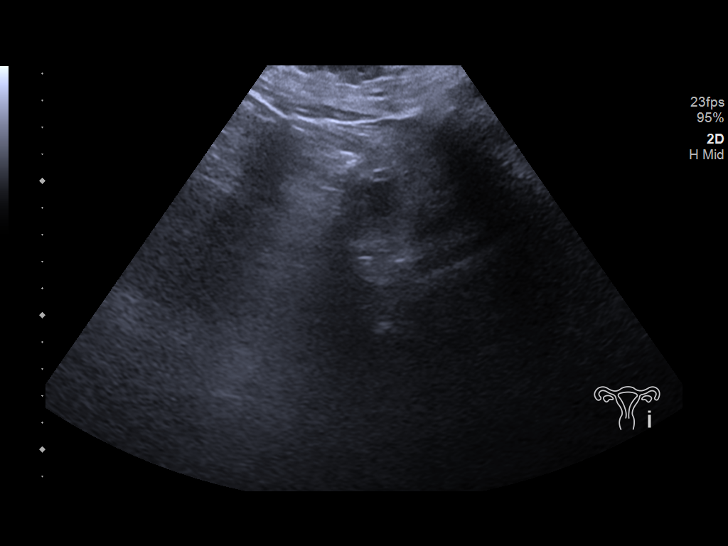
[im 8/13]
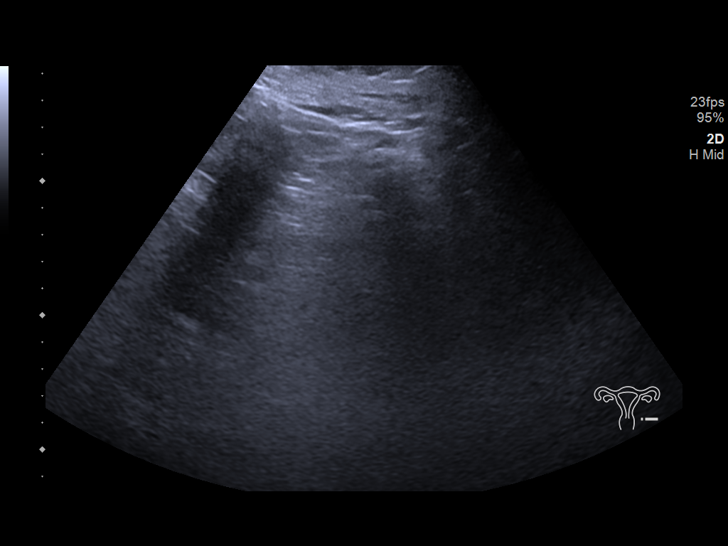
[im 9/13]
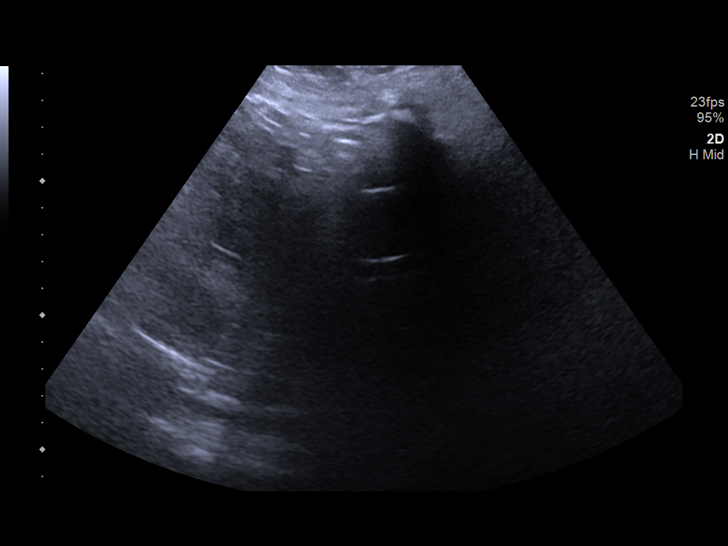
[im 10/13]
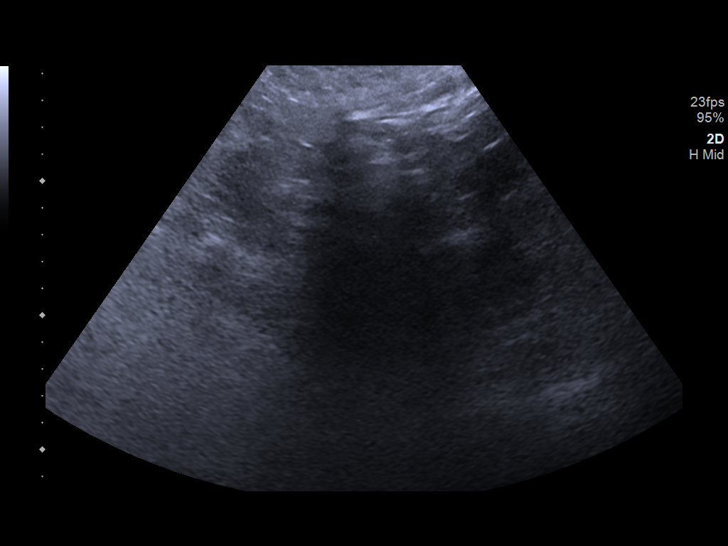
[im 11/13]
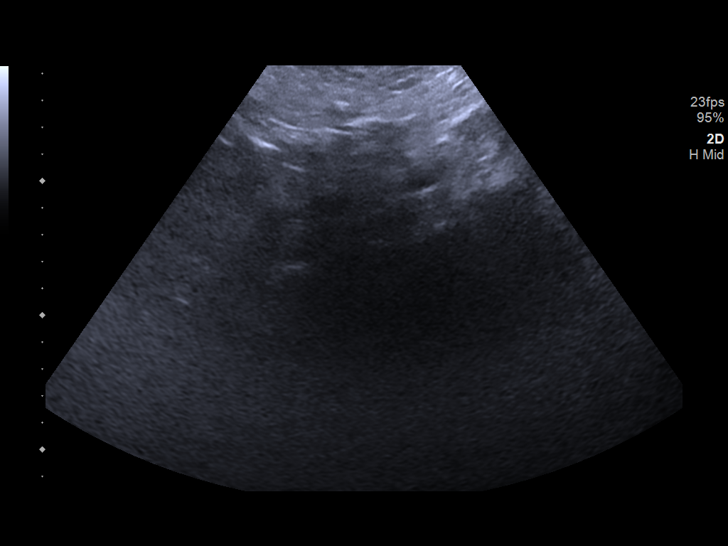
[im 12/13]
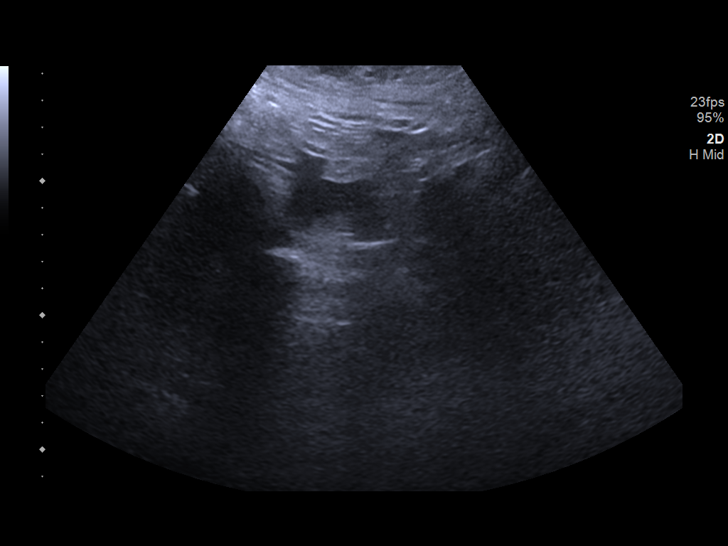
[im 13/13]
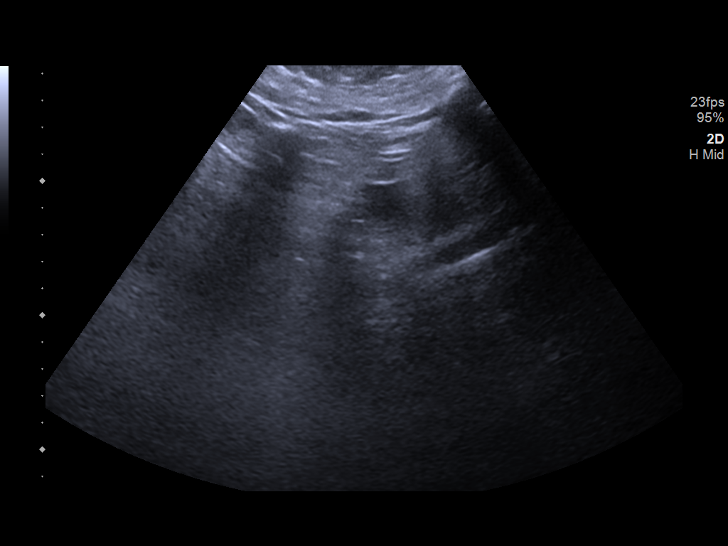

[13 of 13 positions shown; findings below may reference images not displayed]

FINDINGS: Uterus

Uterus is not visualized likely due to decompression of urinary
bladder by Foley catheter and poor acoustic window. Numerous bowel
loops in pelvis.

Endometrium

Uterus and endometrium not visualized due to bowel gas and
decompressed bladder

Right ovary

Not visualized on either transabdominal or endovaginal imaging,
likely obscured by bowel

Left ovary

Not visualized on either transabdominal or endovaginal imaging,
likely obscured by bowel

Other findings: No free fluid. No obvious pelvic masses. Foley
catheter seen with[REDACTED]ompressed bladder.
IMPRESSION: Nondiagnostic exam due to decompressed urinary bladder related to
Foley catheter and subsequent poor acoustic window.

Numerous bowel loops in pelvis with shadowing.

Patient refused transvaginal imaging.

## 2020-03-18 IMAGING — US US PELVIS COMPLETE WITH TRANSVAGINAL
1 series · 13 of 25 positions shown · non-contrast
Comparison: 09/03/2019

CLINICAL DATA: Abnormal vaginal bleeding

EXAM:
TRANSABDOMINAL AND TRANSVAGINAL ULTRASOUND OF PELVIS
TECHNIQUE: Both transabdominal and transvaginal ultrasound examinations of the
pelvis were performed. Transabdominal technique was performed for
global imaging of the pelvis including uterus, ovaries, adnexal
regions, and pelvic cul-de-sac. It was necessary to proceed with
endovaginal exam following the transabdominal exam to visualize the
adnexa and uterus.

[Series 1: us pelvis complete with transvaginal · 43 acquisitions, 13 frames shown]
[im 1/43]
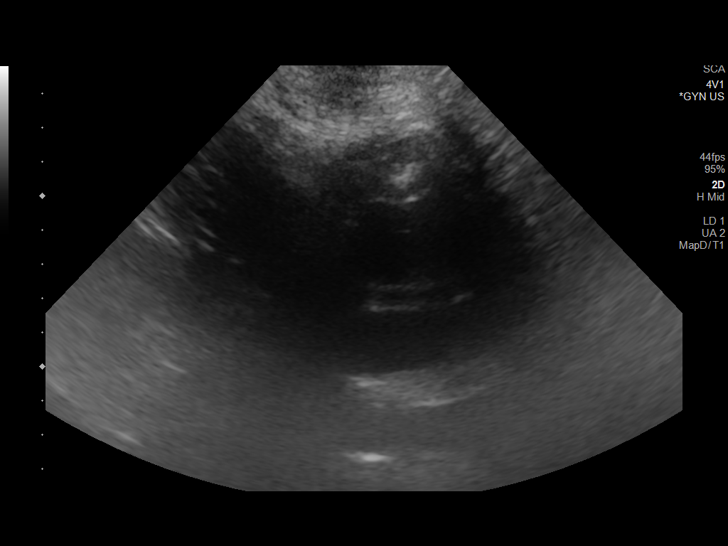
[im 4/43]
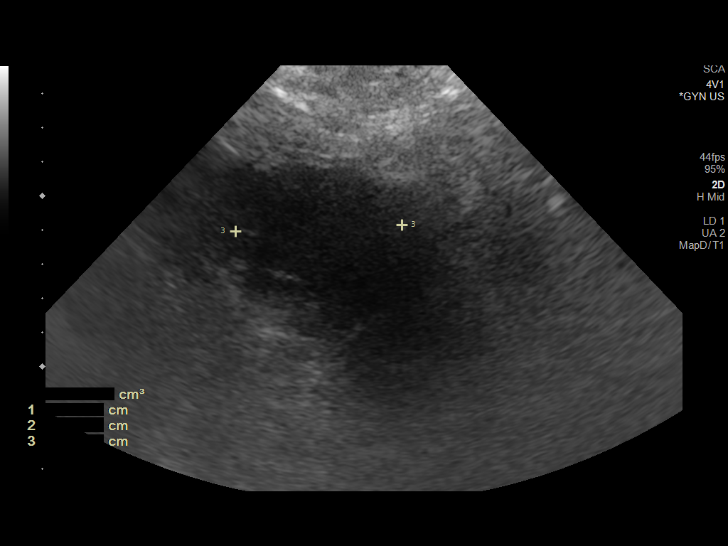
[im 8/43]
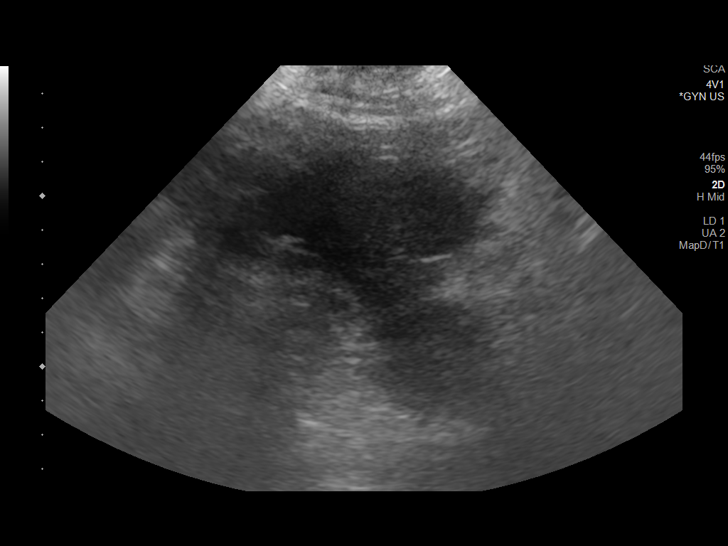
[im 11/43]
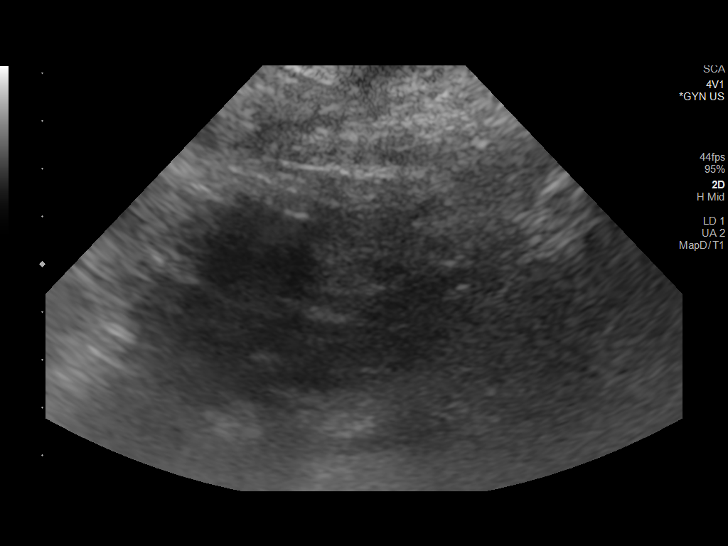
[im 15/43]
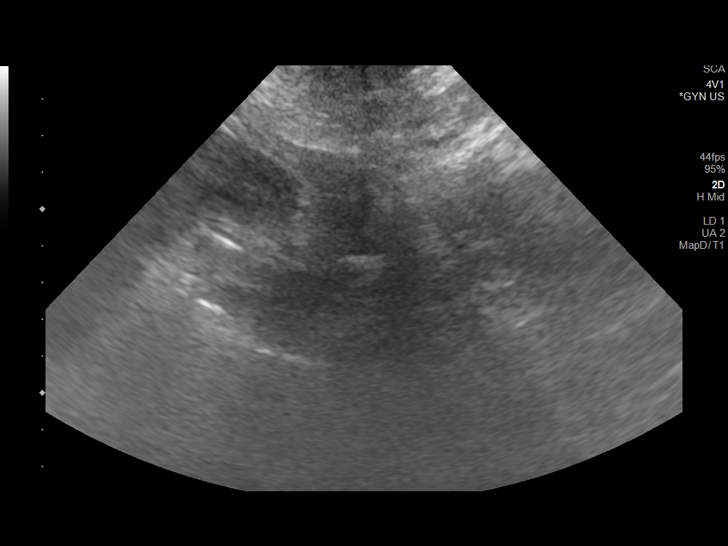
[im 18/43]
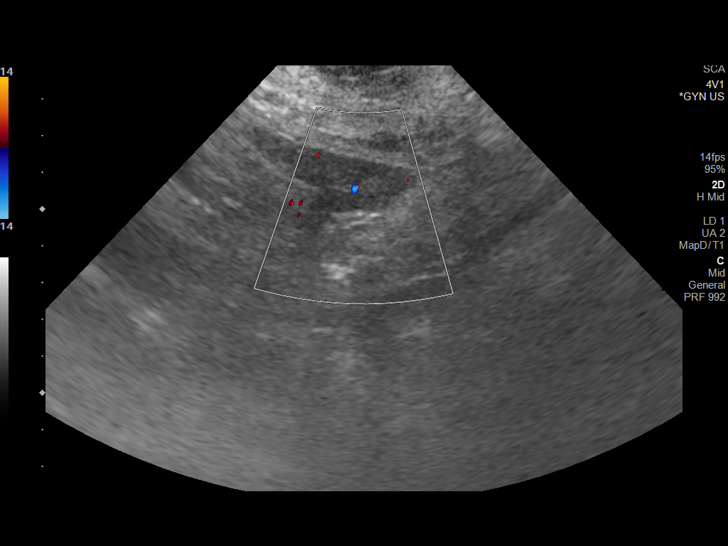
[im 22/43]
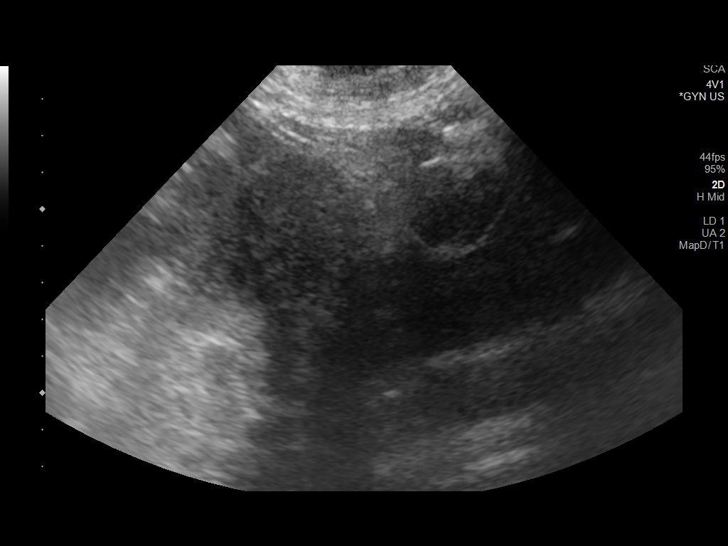
[im 25/43]
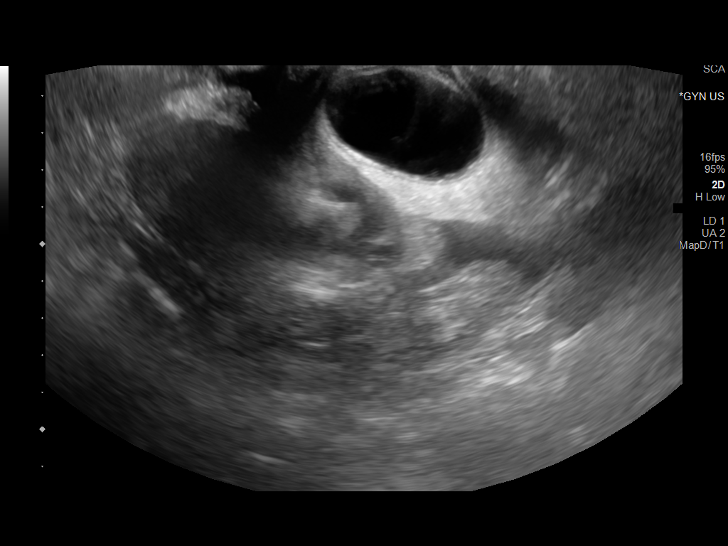
[im 29/43]
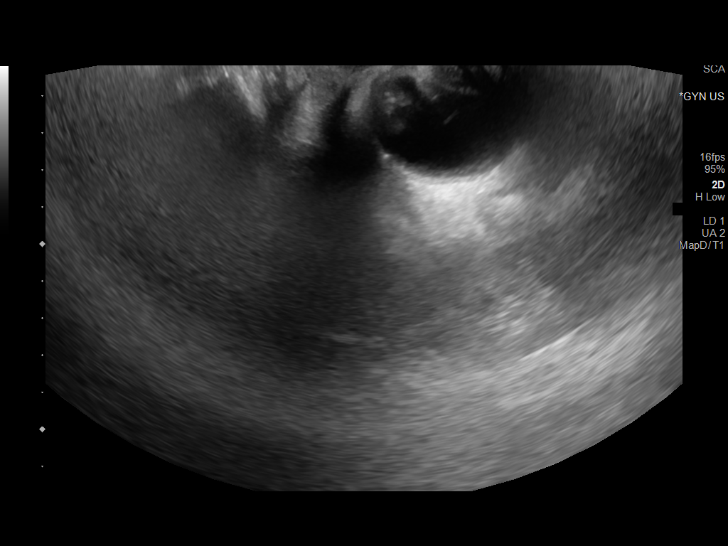
[im 32/43]
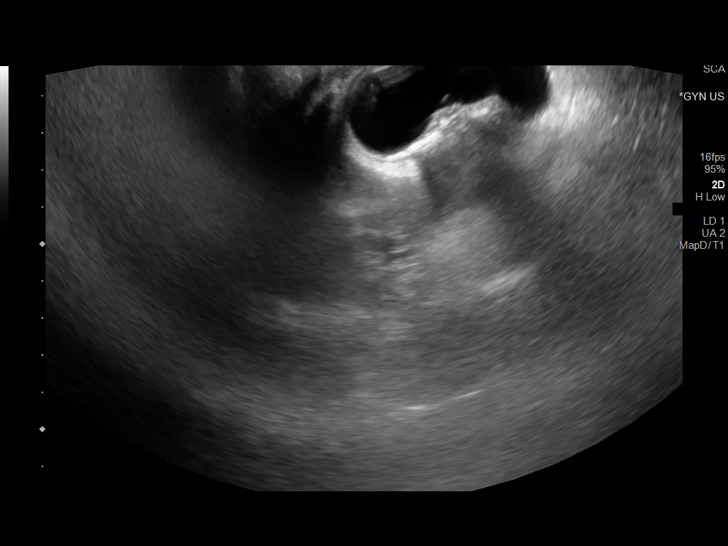
[im 36/43]
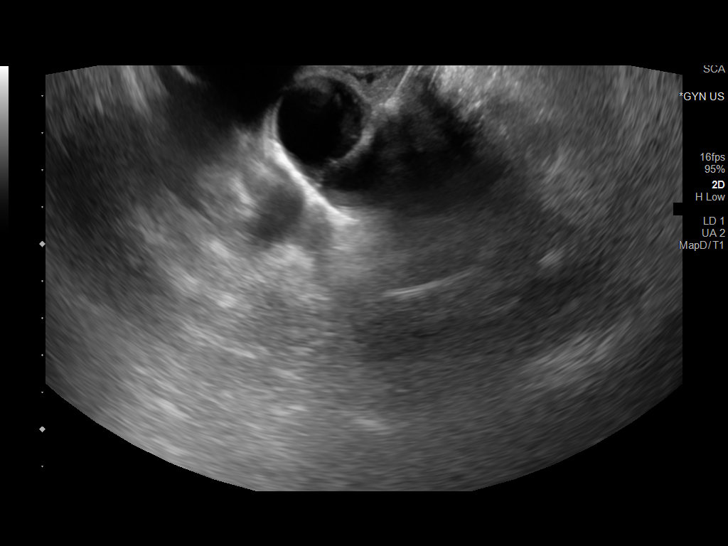
[im 39/43]
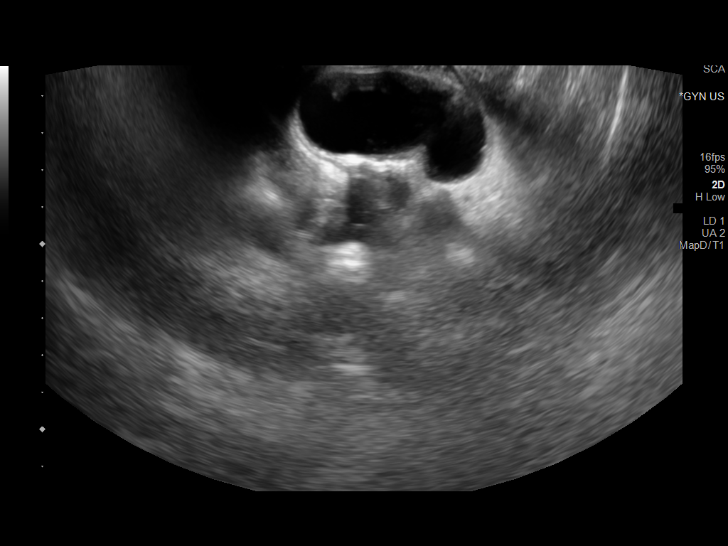
[im 43/43]
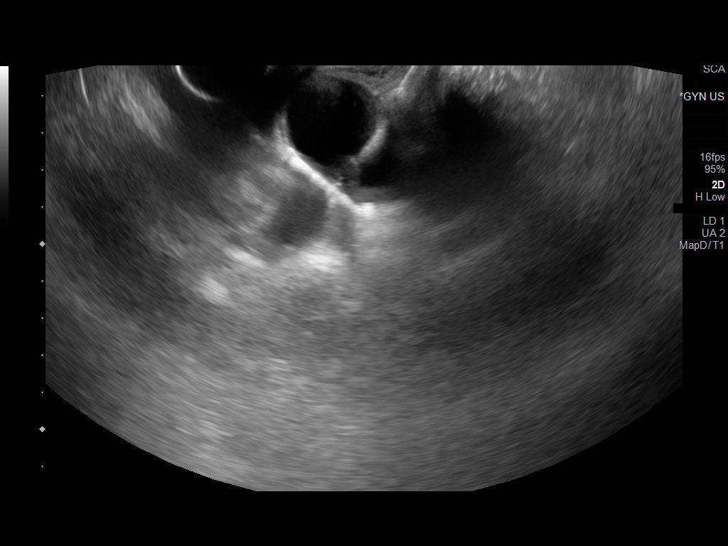

[13 of 25 positions shown; findings below may reference images not displayed]

FINDINGS: Uterus

Measurements: Approximately 7.3 x 4.2 x 4.9 cm = volume: 78.8 mL.
Poorly visualized due to Foley catheter and rectal tube.

Endometrium

Unable to be visualized or measures

Right ovary

Probably visualized trans abdominally. Measurements: 1.5 x 1.4 x
cm = volume: 1.6 mL. No obvious mass.

Left ovary

Probably visualized trans abdominally. Measurements: 2.7 x 1 x
cm = volume: 4 mL. No gross mass.

Other findings

No abnormal free fluid.
IMPRESSION: 1. Very limited study. The uterus and adnexa are poorly visible due
to the presence of Foley catheter and what appears to be rectal
tubing. The endometrial stripe could not be confidently visualized
or measured.

## 2020-03-22 ENCOUNTER — Telehealth (HOSPITAL_COMMUNITY): Payer: Self-pay | Admitting: Pharmacist

## 2020-03-22 ENCOUNTER — Other Ambulatory Visit (HOSPITAL_COMMUNITY): Payer: Self-pay | Admitting: Cardiology

## 2020-03-22 MED ORDER — PANTOPRAZOLE SODIUM 40 MG PO TBEC: 40.0000 mg | DELAYED_RELEASE_TABLET | Freq: Every day | ORAL | 3 refills | Status: DC

## 2020-03-22 MED ORDER — FOLIC ACID 1 MG PO TABS: 1.0000 mg | ORAL_TABLET | Freq: Every day | ORAL | 3 refills | Status: DC

## 2020-03-22 NOTE — Telephone Encounter (Signed)
Refills for folic acid and pantoprazole sent to [facility].  Karle Plumber, PharmD, BCPS, BCCP, CPP Heart Failure Clinic Pharmacist [phone]

## 2020-03-29 ENCOUNTER — Other Ambulatory Visit (HOSPITAL_COMMUNITY): Payer: Self-pay | Admitting: *Deleted

## 2020-03-29 MED ORDER — PANTOPRAZOLE SODIUM 40 MG PO TBEC: 40.0000 mg | DELAYED_RELEASE_TABLET | Freq: Every day | ORAL | 3 refills | Status: DC

## 2020-03-29 MED ORDER — FOLIC ACID 1 MG PO TABS: 1.0000 mg | ORAL_TABLET | Freq: Every day | ORAL | 3 refills | Status: DC

## 2020-03-30 ENCOUNTER — Other Ambulatory Visit: Payer: Self-pay

## 2020-03-30 ENCOUNTER — Ambulatory Visit (HOSPITAL_COMMUNITY)
Admission: RE | Admit: 2020-03-30 | Discharge: 2020-03-30 | Disposition: A | Discharge status: Home / Self Care | Payer: 59 | Source: Ambulatory Visit | Attending: Internal Medicine | Admitting: Internal Medicine

## 2020-03-30 DIAGNOSIS — I5022 Chronic systolic (congestive) heart failure: Secondary | ICD-10-CM | POA: Insufficient documentation

## 2020-03-30 LAB — LIPID PANEL
Cholesterol: 136 mg/dL (ref 0–200)
HDL: 38 mg/dL — ABNORMAL LOW (ref >40)
LDL Cholesterol: 71 mg/dL (ref 0–99)
Total CHOL/HDL Ratio: 3.6 RATIO
Triglycerides: 134 mg/dL (ref <150)
VLDL: 27 mg/dL (ref 0–40)

## 2020-03-31 ENCOUNTER — Encounter: Payer: Self-pay | Admitting: Physician Assistant

## 2020-03-31 ENCOUNTER — Ambulatory Visit (INDEPENDENT_AMBULATORY_CARE_PROVIDER_SITE_OTHER): Payer: 59 | Admitting: Physician Assistant

## 2020-03-31 DIAGNOSIS — L02611 Cutaneous abscess of right foot: Secondary | ICD-10-CM

## 2020-03-31 NOTE — Progress Notes (Signed)
Office Visit Note   Patient: Briana Andrews           Date of Birth: November 17, 1966           MRN: [id number] Visit Date: 03/31/2020              Requested by: No referring provider defined for this encounter. PCP: Patient, [name]  No chief complaint on file.     HPI: Patient follows up today she is status post split thickness skin graft 6 months ago over her right foot.  At her last visit I let her start weightbearing.  She is doing very very well.  Assessment & Plan: Visit Diagnoses: No diagnosis found.  Plan: She will follow up in 4 to 6 weeks.  Follow-Up Instructions: No follow-ups on file.   Ortho Exam  Patient is alert, oriented, no adenopathy, well-dressed, normal affect, normal respiratory effort. Focused examination demonstrates excellent epithelialization of the skin graft.  It has healthy skin growth and the area has significantly decreased in size.  The area remaining is healthy tissue.  It is at skin level.  No necrosis no surrounding cellulitis  Imaging: No results found. No images are attached to the encounter.  Labs: Lab Results  Component Value Date   HGBA1C 5.8 (H) 08/17/2019   ESRSEDRATE 0 08/16/2019   REPTSTATUS 08/19/2019 FINAL 08/18/2019   CULT  08/18/2019    NO GROWTH Performed at [facility], [place] [address] [place],  [address]      Lab Results  Component Value Date   ALBUMIN 3.1 (L) 11/25/2019   ALBUMIN 2.6 (L) 10/15/2019   ALBUMIN 1.2 (L) 09/05/2019    Lab Results  Component Value Date   MG 2.1 09/08/2019   MG 1.4 (L) 09/07/2019   MG 1.9 09/02/2019   No results found for: VD25OH  No results found for: PREALBUMIN CBC EXTENDED Latest Ref Rng & Units 11/25/2019 10/15/2019 09/29/2019  WBC 4.0 - 10.5 K/uL 8.7 7.4 8.3  RBC 3.87 - 5.11 MIL/uL 3.62(L) 3.38(L) 3.21(L)  HGB 12.0 - 15.0 g/dL 11.2(L) 10.6(L) 10.2(L)  HCT 36.0 - 46.0 % 34.9(L) 32.8(L) 31.4(L)  PLT 150 - 400 K/uL 449(H) 535(H) 680(H)  NEUTROABS 1.7 - 7.7  K/uL - - -  LYMPHSABS 0.7 - 4.0 K/uL - - -     There is no height or weight on file to calculate BMI.  Orders:  No orders of the defined types were placed in this encounter.  No orders of the defined types were placed in this encounter.    Procedures: No procedures performed  Clinical Data: No additional findings.  ROS:  All other systems negative, except as noted in the HPI. Review of Systems  Objective: Vital Signs: There were no vitals taken for this visit.  Specialty Comments:  No specialty comments available.  PMFS History: Patient Active Problem List   Diagnosis Date Noted  . Gangrene of right foot ([place])   . Cutaneous abscess of right foot   . Endotracheally intubated   . Acute on chronic systolic CHF (congestive heart failure) ([place])   . GI bleed 08/26/2019  . Pressure injury of skin 08/23/2019  . Acute upper GI bleed   . Shock circulatory ([place])   . Palliative care by specialist   . Goals of care, counseling/discussion   . Advanced care planning/counseling discussion   . Jaundice   . Cerebral embolism with cerebral infarction 08/17/2019  . PVD (peripheral vascular disease) ([place]) 08/17/2019  .  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 Acute CHF (congestive heart failure) (HCC) 08/17/2019  . Abnormal LFTs 08/16/2019  . Acute metabolic encephalopathy 08/16/2019  . SOB (shortness of breath) 08/16/2019  . AKI (acute kidney injury) (HCC) 08/16/2019  . Fall 08/16/2019  . Lactic acidosis 08/16/2019  . Back pain 08/16/2019  . Liver failure without hepatic coma (HCC) 08/16/2019  . Multifocal pneumonia 08/16/2019  . Alcohol abuse   . Tobacco abuse   . Shortness of breath    Past Medical History:  Diagnosis Date  . Alcohol abuse   . CHF (congestive heart failure) (HCC)    last EF 35-40%. etoh induced  . Coronary artery disease    80%RCA, 50%LCX, 40%LAD  . Depression   . DVT (deep venous thrombosis) (HCC)    right popliteal vein  (08/17/19)  . GERD (gastroesophageal reflux disease)   . Hyperlipidemia   . LV (left ventricular) mural thrombus   . Stroke [facility])    / memory loss  . Tobacco abuse     Family History  Problem Relation Age of Onset  . Heart disease Mother     Past Surgical History:  Procedure Laterality Date  . ABDOMINAL AORTOGRAM W/LOWER EXTREMITY N/A 08/19/2019   Procedure: ABDOMINAL AORTOGRAM W/LOWER EXTREMITY;  Surgeon: Nada Libman, MD;  [facility] IN[facility] LAB;  Service: Cardiovascular;  Laterality: N/A;  . ABDOMINAL AORTOGRAM W/LOWER EXTREMITY Bilateral 08/20/2019   Procedure: ABDOMINAL AORTOGRAM W/LOWER EXTREMITY;  Surgeon: Cephus Shelling, MD;  Location: [facility] LAB;  Service: Cardiovascular;  Laterality: Bilateral;  . DEBRIDEMENT  FOOT Right 10/15/2019  . ESOPHAGOGASTRODUODENOSCOPY N/A 08/26/2019   Procedure: ESOPHAGOGASTRODUODENOSCOPY (EGD);  Surgeon: Charlott Rakes, MD;  Location: [facility] ENDOSCOPY;  Service: Endoscopy;  Laterality: N/A;  . HOT HEMOSTASIS N/A 08/26/2019   Procedure: HOT HEMOSTASIS (ARGON PLASMA COAGULATION/BICAP);  Surgeon: Charlott Rakes, MD;  Location: [facility] ENDOSCOPY;  Service: Endoscopy;  Laterality: N/A;  . I & D EXTREMITY Right 10/15/2019   Procedure: RIGHT FOOT DEBRIDEMENT;  Surgeon: Nadara Mustard, MD;  Location: [facility] OR;  Service: Orthopedics;  Laterality: Right;  . PERIPHERAL VASCULAR BALLOON ANGIOPLASTY  08/19/2019   Procedure: PERIPHERAL VASCULAR BALLOON ANGIOPLASTY;  Surgeon: Nada Libman, MD;  [facility] INVAS[facility] CV LAB;  Service: Cardiovascular;;  Right femoral popliteal and PT   . PERIPHERAL VASCULAR INTERVENTION Left 08/20/2019   Procedure: PERIPHERAL VASCULAR INTERVENTION;  Surgeon: Cephus Shelling, MD;  Location: [facility] INVASIVE CV LAB;  Service: Cardiovascular;  Laterality: Left;  . RIGHT/LEFT HEART CATH AND CORONARY ANGIOGRAPHY N/A 09/02/2019   Procedure: RIGHT/LEFT HEART CATH AND CORONARY ANGIOGRAPHY;  Surgeon: Laurey Morale, MD;   Location: [facility] INVASIVE CV LAB;  Service: Cardiovascular;  Laterality: N/A;  . SCLEROTHERAPY  08/26/2019   Procedure: SCLEROTHERAPY;  Surgeon: Charlott Rakes, MD;  Location: [facility] ENDOSCOPY;  Service: Endoscopy;;   Social History   Occupational History  . Not on file  Tobacco Use  . Smoking status: Former Smoker    Years: 39.00    Quit date: 06/2019    Years since quitting: 0.7  . Smokeless tobacco: Never Used  Substance and Sexual Activity  . Alcohol use: Not Currently    Comment: alcoholism - last drink in august 2020  . Drug use: Not Currently    Types: Marijuana    Comment: none since August 2020  . Sexual activity: Not on file

## 2020-04-05 ENCOUNTER — Ambulatory Visit: Payer: 59

## 2020-04-05 ENCOUNTER — Ambulatory Visit (HOSPITAL_COMMUNITY)
Admission: RE | Admit: 2020-04-05 | Discharge: 2020-04-05 | Disposition: A | Discharge status: Home / Self Care | Payer: 59 | Source: Ambulatory Visit | Attending: Vascular Surgery | Admitting: Vascular Surgery

## 2020-04-05 DIAGNOSIS — I739 Peripheral vascular disease, unspecified: Secondary | ICD-10-CM

## 2020-04-07 ENCOUNTER — Ambulatory Visit (INDEPENDENT_AMBULATORY_CARE_PROVIDER_SITE_OTHER): Payer: 59

## 2020-04-07 ENCOUNTER — Other Ambulatory Visit: Payer: Self-pay

## 2020-04-07 DIAGNOSIS — Z5181 Encounter for therapeutic drug level monitoring: Secondary | ICD-10-CM

## 2020-04-07 DIAGNOSIS — I240 Acute coronary thrombosis not resulting in myocardial infarction: Secondary | ICD-10-CM | POA: Diagnosis not present

## 2020-04-07 LAB — POCT INR: INR: 2.8 (ref 2.0–3.0)

## 2020-04-07 NOTE — Patient Instructions (Signed)
-   continue dosage of warfarin 5 mg every day EXCEPT 1.5 tablets on MONDAYS & FRIDAYS. - Recheck INR in 6 wks

## 2020-04-16 ENCOUNTER — Telehealth: Payer: Self-pay | Admitting: Internal Medicine

## 2020-04-16 MED ORDER — WARFARIN SODIUM 5 MG PO TABS: ORAL_TABLET | ORAL | 0 refills | Status: DC

## 2020-04-16 NOTE — Telephone Encounter (Signed)
Refill has been sent in.  

## 2020-04-16 NOTE — Telephone Encounter (Signed)
*  STAT* If patient is at the pharmacy, call can be transferred to refill team.   1. Which medications need to be refilled? (please list name of each medication and dose if known)   Warfarin   2. Which pharmacy/location (including street and city if local pharmacy) is medication to be sent to?   [facility] [address] [place]   3. Do they need a 30 day or 90 day supply? 90

## 2020-04-22 ENCOUNTER — Telehealth (HOSPITAL_COMMUNITY): Payer: Self-pay

## 2020-04-22 NOTE — Telephone Encounter (Signed)

## 2020-04-23 ENCOUNTER — Other Ambulatory Visit: Payer: Self-pay

## 2020-04-23 ENCOUNTER — Ambulatory Visit (INDEPENDENT_AMBULATORY_CARE_PROVIDER_SITE_OTHER): Payer: 59 | Admitting: Physician Assistant

## 2020-04-23 ENCOUNTER — Ambulatory Visit (HOSPITAL_COMMUNITY)
Admission: RE | Admit: 2020-04-23 | Discharge: 2020-04-23 | Disposition: A | Discharge status: Home / Self Care | Payer: 59 | Source: Ambulatory Visit | Attending: Vascular Surgery | Admitting: Vascular Surgery

## 2020-04-23 VITALS — BP 155/78 | HR 65 | Temp 97.2°F | Resp 16 | Ht 64.0 in | Wt 158.0 lb

## 2020-04-23 DIAGNOSIS — I739 Peripheral vascular disease, unspecified: Secondary | ICD-10-CM

## 2020-04-23 NOTE — Progress Notes (Signed)
History of Present Illness:  Briana Andrews is a 54 y.o. female with history of CHF and severely depressed EF and left ventricular thrombus who underwent bilateral lower extremity mechanical thrombectomies by myself and Dr. Trula Slade last year.  Patient was initially seen with bilateral lower extremity ischemia.  Dr. Trula Slade initially performed right lower extremity mechanical thrombectomy on 08/19/2019 with right SFA popliteal and PT angioplasty.  I subsequent performed left lower extremity thrombectomy the following day on 08/20/2019.  This was all thought to be related to cardiac thrombus.  Ultimately her left foot has completely healed without any issues.  Her right foot had a large necrotic eschar on the dorsum of the foot when last evaluated.  She subsequently underwent debridement with Dr. Sharol Given and feels this is improving significantly.  No rest pain in the feet bilaterally.  Remains on coumadin.   She is able to ambulate with heel weight bearing in a post op shoe.  She performs elevation when at rest and dry dressing changes PRN/atleast daily.  Clear drainage with occasional bleeding.  She wears knee high compression daily.  She denise fever and chills.     Past Medical History:  Diagnosis Date  . Alcohol abuse   . CHF (congestive heart failure) ([place])    last EF 35-40%. etoh induced  . Coronary artery disease    80%RCA, 50%LCX, 40%LAD  . Depression   . DVT (deep venous thrombosis) ([facility])    right popliteal vein (08/17/19)  . GERD (gastroesophageal reflux disease)   . Hyperlipidemia   . LV (left ventricular) mural thrombus   . Stroke [facility])    / memory loss  . Tobacco abuse     Past Surgical History:  Procedure Laterality Date  . ABDOMINAL AORTOGRAM W/LOWER EXTREMITY N/A 08/19/2019   Procedure: ABDOMINAL AORTOGRAM W/LOWER EXTREMITY;  Surgeon: Serafina Mitchell, MD;  Location: [place] [facility];  Service: Cardiovascular;  Laterality: N/A;  . ABDOMINAL AORTOGRAM W/LOWER EXTREMITY  Bilateral 08/20/2019   Procedure: ABDOMINAL AORTOGRAM W/LOWER EXTREMITY;  Surgeon: Marty Heck, MD;  Location: [place] [facility];  Service: Cardiovascular;  Laterality: Bilateral;  . DEBRIDEMENT  FOOT Right 10/15/2019  . ESOPHAGOGASTRODUODENOSCOPY N/A 08/26/2019   Procedure: ESOPHAGOGASTRODUODENOSCOPY (EGD);  Surgeon: Wilford Corner, MD;  Location: [place];  Service: Endoscopy;  Laterality: N/A;  . HOT HEMOSTASIS N/A 08/26/2019   Procedure: HOT HEMOSTASIS (ARGON PLASMA COAGULATION/BICAP);  Surgeon: Wilford Corner, MD;  Location: [place];  Service: Endoscopy;  Laterality: N/A;  . I & D EXTREMITY Right 10/15/2019   Procedure: RIGHT FOOT DEBRIDEMENT;  Surgeon: Newt Minion, MD;  Location: [place];  Service: Orthopedics;  Laterality: Right;  . PERIPHERAL VASCULAR BALLOON ANGIOPLASTY  08/19/2019   Procedure: PERIPHERAL VASCULAR BALLOON ANGIOPLASTY;  Surgeon: Serafina Mitchell, MD;  Location: [place] [facility];  Service: Cardiovascular;;  Right femoral popliteal and PT   . PERIPHERAL VASCULAR INTERVENTION Left 08/20/2019   Procedure: PERIPHERAL VASCULAR INTERVENTION;  Surgeon: Marty Heck, MD;  Location: [place] [facility];  Service: Cardiovascular;  Laterality: Left;  . RIGHT/LEFT HEART CATH AND CORONARY ANGIOGRAPHY N/A 09/02/2019   Procedure: RIGHT/LEFT HEART CATH AND CORONARY ANGIOGRAPHY;  Surgeon: Larey Dresser, MD;  Location: [place] [facility];  Service: Cardiovascular;  Laterality: N/A;  . SCLEROTHERAPY  08/26/2019   Procedure: SCLEROTHERAPY;  Surgeon: Wilford Corner, MD;  Location: [facility] ENDOSCOPY;  Service: Endoscopy;;    ROS:   General:  No weight loss, Fever, chills  HEENT: No  recent headaches, no nasal bleeding, no visual changes, no sore throat  Neurologic: No dizziness, blackouts, seizures. No recent symptoms of stroke or mini- stroke. No recent episodes of slurred speech, or temporary blindness.  Cardiac: No recent episodes of chest pain/pressure, no  shortness of breath at rest.  No shortness of breath with exertion.  Denies history of atrial fibrillation or irregular heartbeat  Vascular: No history of rest pain in feet.  No history of claudication.  No history of non-healing ulcer, No history of DVT   Pulmonary: No home oxygen, no productive cough, no hemoptysis,  No asthma or wheezing  Musculoskeletal:  [ ]  Arthritis, [ ]  Low back pain,  [ ]  Joint pain  Hematologic:No history of hypercoagulable state.  No history of easy bleeding.  No history of anemia  Gastrointestinal: No hematochezia or melena,  No gastroesophageal reflux, no trouble swallowing  Urinary: [ ]  chronic Kidney disease, [ ]  on HD - [ ]  MWF or [ ]  TTHS, [ ]  Burning with urination, [ ]  Frequent urination, [ ]  Difficulty urinating;   Skin: No rashes  Psychological: No history of anxiety,  No history of depression  Social History Social History   Tobacco Use  . Smoking status: Former Smoker    Years: 39.00    Quit date: 06/2019    Years since quitting: 0.8  . Smokeless tobacco: Never Used  Substance Use Topics  . Alcohol use: Not Currently    Comment: alcoholism - last drink in august 2020  . Drug use: Not Currently    Types: Marijuana    Comment: none since August 2020    Family History Family History  Problem Relation Age of Onset  . Heart disease Mother     Allergies  Allergies  Allergen Reactions  . Norco [Hydrocodone-Acetaminophen] Nausea And Vomiting  . Penicillins Other (See Comments)    Unsure of reaction Did it involve swelling of the face/tongue/throat, SOB, or low BP? Unknown Did it involve sudden or severe rash/hives, skin peeling, or any reaction on the inside of your mouth or nose? Unknown Did you need to seek medical attention at a hospital or doctor's office? Unknown When did it last happen?Childhood If all above answers are "NO", may proceed with cephalosporin use.     Current Outpatient Medications  Medication Sig  Dispense Refill  . carvedilol (COREG) 6.25 MG tablet Take 1 tablet (6.25 mg total) by mouth 2 (two) times daily with a meal. 60 tablet 4  . enoxaparin (LOVENOX) 60 MG/0.6ML injection Inject 0.6 mLs (60 mg total) into the skin every 12 (twelve) hours. 6 mL 1  . folic acid (FOLVITE) 1 MG tablet Take 1 tablet (1 mg total) by mouth daily. 90 tablet 3  . furosemide (LASIX) 20 MG tablet Take 1 tablet (20 mg total) by mouth daily as needed (fluid retention.). 30 tablet 11  . ibuprofen (ADVIL) 200 MG tablet Take 200 mg by mouth every 6 (six) hours as needed for headache or moderate pain.    icosapent Ethyl (VASCEPA) 1 g capsule Take 2 capsules (2 g total) by mouth 2 (two) times daily. 120 capsule 5  . pantoprazole (PROTONIX) 40 MG tablet Take 1 tablet (40 mg total) by mouth daily. 90 tablet 3  . rosuvastatin (CRESTOR) 5 MG tablet Take 5 mg by mouth daily.    . sacubitril-valsartan (ENTRESTO) 24-26 MG Take 1 tablet by mouth 2 (two) times daily. 60 tablet 5  . silver sulfADIAZINE (SILVADENE) 1 % cream  Apply 1 application topically every other day.    . traMADol (ULTRAM) 50 MG tablet Take 1 tablet (50 mg total) by mouth every 6 (six) hours as needed for moderate pain. 30 tablet 0  . warfarin (COUMADIN) 5 MG tablet Take 1 to 1.5 tablets daily as directed by the anti-coag clinic. 120 tablet 0  . spironolactone (ALDACTONE) 25 MG tablet Take 1 tablet (25 mg total) by mouth daily. 90 tablet 3   No current facility-administered medications for this visit.    Physical Examination  Vitals:   04/23/20 1449  BP: (!) 155/78  Pulse: 65  Resp: 16  Temp: (!) 97.2 F (36.2 C)  TempSrc: Temporal  SpO2: 98%  Weight: 158 lb (71.7 kg)  Height: 5\' 4"  (1.626 m)    Body mass index is 27.12 kg/m.  General:  Alert and oriented, no acute distress HEENT: Normal Neck: No bruit or JVD Pulmonary: Clear to auscultation bilaterally Cardiac: Regular Rate and Rhythm without murmur Abdomen: Soft, non-tender,  non-distended, no mass, no scars Skin:     Extremity Pulses:  2+ radial, brachial, femoral, dorsalis pedis, posterior tibial pulses bilaterally Musculoskeletal: No deformity or edema  Neurologic: Upper and lower extremity motor 5/5 and symmetric  DATA:  ABI Findings:  +---------+------------------+-----+----------+--------+  Right  Rt Pressure (mmHg)IndexWaveform Comment   +---------+------------------+-----+----------+--------+  Brachial 130                      +---------+------------------+-----+----------+--------+  PTA   85        0.65 monophasic      +---------+------------------+-----+----------+--------+  DP    100        0.77 monophasic      +---------+------------------+-----+----------+--------+  Great Toe38        0.29 Abnormal       +---------+------------------+-----+----------+--------+   +---------+------------------+-----+----------+-------+  Left   Lt Pressure (mmHg)IndexWaveform Comment  +---------+------------------+-----+----------+-------+  Brachial 127                      +---------+------------------+-----+----------+-------+  PTA   68        0.52 monophasic      +---------+------------------+-----+----------+-------+  DP    59        0.45 monophasic      +---------+------------------+-----+----------+-------+  Great Toe30        0.23 Abnormal       +---------+------------------+-----+----------+-------+   +-------+-----------+-----------+------------+------------+  ABI/TBIToday's ABIToday's TBIPrevious ABIPrevious TBI  +-------+-----------+-----------+------------+------------+  Right 0.77    0.29    0.82    0.39      +-------+-----------+-----------+------------+------------+  Left  0.52    0.23    0.72    0.28        +-------+-----------+-----------+------------+------------+       Summary:  Right: Resting right ankle-brachial index indicates moderate right lower  extremity arterial disease. The right toe-brachial index is abnormal.   Left: Resting left ankle-brachial index indicates moderate left lower  extremity arterial disease. The left toe-brachial index is abnormal.   ASSESSMENT:  PAD without changes in ABI's from previous visit.  ~50% healing of the right foot dorsum wound over the past 3 months.  PLAN:  Continue current wound care, elevation when at rest, and daily dressing changes. F/u for repeat ABI's in 3 month for close observation per DR. Clark's note.     PA-C [facility] Office: [phone]  MD in clinic [place]

## 2020-04-27 ENCOUNTER — Other Ambulatory Visit: Payer: Self-pay | Admitting: *Deleted

## 2020-04-27 DIAGNOSIS — I739 Peripheral vascular disease, unspecified: Secondary | ICD-10-CM

## 2020-04-29 ENCOUNTER — Ambulatory Visit (INDEPENDENT_AMBULATORY_CARE_PROVIDER_SITE_OTHER): Payer: 59 | Admitting: Orthopedic Surgery

## 2020-04-29 ENCOUNTER — Encounter (HOSPITAL_COMMUNITY): Payer: Self-pay | Admitting: Cardiology

## 2020-04-29 ENCOUNTER — Ambulatory Visit (HOSPITAL_COMMUNITY)
Admission: RE | Admit: 2020-04-29 | Discharge: 2020-04-29 | Disposition: A | Discharge status: Home / Self Care | Payer: 59 | Source: Ambulatory Visit | Attending: Cardiology | Admitting: Cardiology

## 2020-04-29 ENCOUNTER — Other Ambulatory Visit: Payer: Self-pay

## 2020-04-29 ENCOUNTER — Encounter: Payer: Self-pay | Admitting: Orthopedic Surgery

## 2020-04-29 VITALS — BP 130/70 | HR 71 | Wt 159.4 lb

## 2020-04-29 VITALS — Ht 64.0 in | Wt 159.4 lb

## 2020-04-29 DIAGNOSIS — F1010 Alcohol abuse, uncomplicated: Secondary | ICD-10-CM | POA: Insufficient documentation

## 2020-04-29 DIAGNOSIS — I5022 Chronic systolic (congestive) heart failure: Secondary | ICD-10-CM | POA: Insufficient documentation

## 2020-04-29 DIAGNOSIS — Z87891 Personal history of nicotine dependence: Secondary | ICD-10-CM | POA: Insufficient documentation

## 2020-04-29 DIAGNOSIS — Z79899 Other long term (current) drug therapy: Secondary | ICD-10-CM | POA: Diagnosis not present

## 2020-04-29 DIAGNOSIS — I82431 Acute embolism and thrombosis of right popliteal vein: Secondary | ICD-10-CM | POA: Diagnosis not present

## 2020-04-29 DIAGNOSIS — Z8249 Family history of ischemic heart disease and other diseases of the circulatory system: Secondary | ICD-10-CM | POA: Diagnosis not present

## 2020-04-29 DIAGNOSIS — Z8711 Personal history of peptic ulcer disease: Secondary | ICD-10-CM | POA: Diagnosis not present

## 2020-04-29 DIAGNOSIS — L02611 Cutaneous abscess of right foot: Secondary | ICD-10-CM | POA: Diagnosis not present

## 2020-04-29 DIAGNOSIS — I2510 Atherosclerotic heart disease of native coronary artery without angina pectoris: Secondary | ICD-10-CM | POA: Diagnosis not present

## 2020-04-29 DIAGNOSIS — Z7901 Long term (current) use of anticoagulants: Secondary | ICD-10-CM | POA: Insufficient documentation

## 2020-04-29 DIAGNOSIS — Z8673 Personal history of transient ischemic attack (TIA), and cerebral infarction without residual deficits: Secondary | ICD-10-CM | POA: Diagnosis not present

## 2020-04-29 DIAGNOSIS — I428 Other cardiomyopathies: Secondary | ICD-10-CM | POA: Insufficient documentation

## 2020-04-29 DIAGNOSIS — Z86718 Personal history of other venous thrombosis and embolism: Secondary | ICD-10-CM | POA: Insufficient documentation

## 2020-04-29 LAB — BASIC METABOLIC PANEL
Anion gap: 7 (ref 5–15)
BUN: 28 mg/dL — ABNORMAL HIGH (ref 6–20)
CO2: 25 mmol/L (ref 22–32)
Calcium: 9.2 mg/dL (ref 8.9–10.3)
Chloride: 105 mmol/L (ref 98–111)
Creatinine, Ser: 1.05 mg/dL — ABNORMAL HIGH (ref 0.44–1.00)
GFR calc Af Amer: >60 mL/min (ref >60)
GFR calc non Af Amer: >60 mL/min (ref >60)
Glucose, Bld: 81 mg/dL (ref 70–99)
Potassium: 4.8 mmol/L (ref 3.5–5.1)
Sodium: 137 mmol/L (ref 135–145)

## 2020-04-29 NOTE — Patient Instructions (Signed)
RESTARTEntresto 24/26mg  (1 tab) twice a day   Labs today We will only contact you if something comes back abnormal or we need to make some changes. Otherwise no news is good news!  Your physician wants you to follow-up in: 4 months. You will receive a reminder letter in the mail two months in advance. If you don't receive a letter, please call our office to schedule the follow-up appointment.   Please call office at [phone] option 2 if you have any questions or concerns.   At the [facility], you and your health needs are our priority. As part of our continuing mission to provide you with exceptional heart care, we have created designated Provider Care Teams. These Care Teams include your primary Cardiologist (physician) and Advanced Practice Providers (APPs- Physician Assistants and Nurse Practitioners) who all work together to provide you with the care you need, when you need it.   You may see any of the following providers on your designated Care Team at your next follow up: Marland Kitchen Dr Arvilla Meres . Dr Marca Ancona . Tonye Becket, NP . Robbie Lis, PA . Karle Plumber, PharmD   Please be sure to bring in all your medications bottles to every appointment.

## 2020-04-29 NOTE — Progress Notes (Signed)
CSW consulted to help pt with [facility] copay cost concerns.  Per patient her insurance changed about a month ago and when she went in to check copay costs it was several hundred dollars so she didn't pick it up and has not been taking it since that time.  Pt had a copay card on file that worked with her last insurance but was not working after insurance changed.  CSW called pharmacy who stated that she needed a new copay card.  CSW activated a new card and provided info to pharmacy and they confirmed that it went through and copay would be $10- CSW requested they fill medication and updated pt.  Will continue to follow and assist as needed  Burna Sis, LCSW Clinical Social Worker [facility] Desk#: [phone] Cell#: [phone]

## 2020-04-30 NOTE — Progress Notes (Signed)
PCP: None  Cardiologist: Dr Shirlee Latch Vascular : Dr Darrick Penna  Ortho: Dr Lajoyce Corners  [facility]   HPI: Briana Andrews is a 54 y.o. alcoholic woman with a history of R frontal CVA, B popliteal artery occlusions, hepatic failure, GI bleeding, cirrhosis, ETOH abuse, and chronic systolic heart failure.  Admitted 08/15/19 with increased sob and confusion. Treated for PNA and MRI brain showed R ACA stroke. Hospital course complicated new onset acute systolic heart failure possibly due to ETOH abuse. Hospital course complicated by LV thrombus, hepatitis, ARF, lower extremity cardioembolism, and shock.  She had suspected cardioembolism to bilateral popliteal arteries and underwent mechanical thrombectomy.  Received PRBCs and was able to undergo EGD 9/29 that showed duodenal and gastric ulcers which were treated endoscopically. Continued on PPI. Heparin has been converted to coumadin. R foot wound was to be followed by Vascular in the community  Discharged to SNF. On 09/21/19 she presented to the [facility] after a fall that resulted in head laceration. CT scan was unremarkable. She left SNF on 09/27/19 because she was not taken to any of her follow up appointments.    She has had follow up with [facility]/Ortho for R foot wound and had debridement 10/14/19.   Echo in 3/21 showed EF increased to 45-50%, mild LV dilation, normal RV.   Today she returns for followup of CHF.  Feet continue to heal, has skin graft on right foot.  Dr. Lajoyce Corners still does not want her walking excessively.  Otherwise, she is doing well.  Weight is up but she is eating better/appetite has returned.  She is staying off ETOH.  She has not had to take Lasix.  No dyspnea walking around her house or out to the car.  Not doing much other than this.  No chest pain.  No orthopnea/PND. She has been off Entresto for a month, says her insurance will not pay for it.   Labs (11/20): K 4.2, creatinine 0.97 Labs (12/20): LDL 55, TGs 259 Labs (3/21): K 4.3,  0.99 Labs (5/21): LDL 71, HDL 38, TGs 134  1. Chronic systolic CHF: Primarily nonischemic cardiomyopathy.   - Echo (9/20): EF 15-20% with LV apical thrombus. Severely decreased RV function.  - RHC (10/20): RA 6, CI 3.7, PVR 3.7  - LHC (10/20): LAD 40%, Left Circumflex 50% , 80% proximal RCA, 60% mid PDA stenosis - Echo (3/21): EF increased to 45-50%, mild LV dilation, normal RV.  2. GI Bleed: Gastric and duodenal ulcers in 9/20.  3. R frontal CVA: In 9/20, due to LV thrombus.  4. 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   - ABIs (11/20): Normal.  5. 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  Liver imaging did not show cirrhosis or portal vein thrombosis. RV failure/congestive hepatopathy may have played a role.  6. ETOH Abuse  7. LV thrombus 8. CAD: LHC 10/20 LAD 40%, Left Circumflex 50% , RCA 80% Proximal RCA 60% mid PDA stenosis.  9. DVT: Right popliteal vein in 9/20.   ROS: All systems negative except as listed in HPI, PMH and Problem List.  SH:  Social History   Socioeconomic History  . Marital status: Married    Spouse name: Not on file  . Number of children: Not on file  . Years of education: Not on file  . Highest education level: Not on file  Occupational History  . Not on file  Tobacco Use  . Smoking status: Former Smoker    Years: 39.00  Quit date: 06/2019    Years since quitting: 0.8  . Smokeless tobacco: Never Used  Substance and Sexual Activity  . Alcohol use: Not Currently    Comment: alcoholism - last drink in august 2020  . Drug use: Not Currently    Types: Marijuana    Comment: none since August 2020  . Sexual activity: Not on file  Other Topics Concern  . Not on file  Social History Narrative  . Not on file   Social Determinants of Health   Financial Resource Strain:   . Difficulty of Paying Living Expenses:   Food Insecurity:   . Worried About Programme researcher, broadcasting/film/video in the Last Year:   . Barista  in the Last Year:   Transportation Needs:   . Freight forwarder (Medical):   Marland Kitchen Lack of Transportation (Non-Medical):   Physical Activity:   . Days of Exercise per Week:   . Minutes of Exercise per Session:   Stress:   . Feeling of Stress :   Social Connections:   . Frequency of Communication with Friends and Family:   . Frequency of Social Gatherings with Friends and Family:   . Attends Religious Services:   . Active Member of Clubs or Organizations:   . Attends Banker Meetings:   Marland Kitchen Marital Status:   Intimate Partner Violence:   . Fear of Current or Ex-Partner:   . Emotionally Abused:   Marland Kitchen Physically Abused:   . Sexually Abused:     FH:  Family History  Problem Relation Age of Onset  . Heart disease Mother     Current Outpatient Medications  Medication Sig Dispense Refill  . carvedilol (COREG) 6.25 MG tablet Take 1 tablet (6.25 mg total) by mouth 2 (two) times daily with a meal. 60 tablet 4  . folic acid (FOLVITE) 1 MG tablet Take 1 tablet (1 mg total) by mouth daily. 90 tablet 3  . furosemide (LASIX) 20 MG tablet Take 1 tablet (20 mg total) by mouth daily as needed (fluid retention.). 30 tablet 11  . ibuprofen (ADVIL) 200 MG tablet Take 200 mg by mouth every 6 (six) hours as needed for headache or moderate pain.    Marland Kitchen icosapent Ethyl (VASCEPA) 1 g capsule Take 2 capsules (2 g total) by mouth 2 (two) times daily. 120 capsule 5  . pantoprazole (PROTONIX) 40 MG tablet Take 1 tablet (40 mg total) by mouth daily. 90 tablet 3  . rosuvastatin (CRESTOR) 5 MG tablet Take 5 mg by mouth daily.    . silver sulfADIAZINE (SILVADENE) 1 % cream Apply 1 application topically every other day.    . spironolactone (ALDACTONE) 25 MG tablet Take 1 tablet (25 mg total) by mouth daily. 90 tablet 3  . traMADol (ULTRAM) 50 MG tablet Take 1 tablet (50 mg total) by mouth every 6 (six) hours as needed for moderate pain. 30 tablet 0  . warfarin (COUMADIN) 5 MG tablet Take 1 to 1.5  tablets daily as directed by the anti-coag clinic. 120 tablet 0   No current facility-administered medications for this encounter.    Vitals:   04/29/20 1222  BP: 130/70  Pulse: 71  SpO2: 100%  Weight: 72.3 kg (159 lb 6.4 oz)   Wt Readings from Last 3 Encounters:  04/29/20 72.3 kg (159 lb 6.4 oz)  04/29/20 72.3 kg (159 lb 6.4 oz)  04/23/20 71.7 kg (158 lb)    PHYSICAL EXAM: General: NAD Neck: No JVD,  no thyromegaly or thyroid nodule.  Lungs: Clear to auscultation bilaterally with normal respiratory effort. CV: Nondisplaced PMI.  Heart regular S1/S2, no S3/S4, no murmur.  No peripheral edema.  No carotid bruit.  Normal pedal pulses.  Abdomen: Soft, nontender, no hepatosplenomegaly, no distention.  Skin: Intact without lesions or rashes.  Neurologic: Alert and oriented x 3.  Psych: Normal affect. Extremities: No clubbing or cyanosis.  HEENT: Normal.   ASSESSMENT & PLAN: 1. Chronic systolic CHF:  Cardiogenic shock in 9/20, echo 07/2019 with EF 15-20%, mild LV dilation, LV thrombus, mild-moderate MR, severely decreased RV systolic function.  LHC/RHC done 10/20 showing normal filling pressures and preserved cardiac output.  There was diffuse coronary disease, especially involving distal and branch vessels, only possible interventional target was 80% proximal RCA stenosis.  No intervention given good flow down the vessel and unlikely to significantly improve LV function.  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 Echo in 3/21 showed EF up to 45-50%.  NYHA class 2, no volume overload on exam.   - Continue Coreg 6.25 mg bid.   - Restart Entresto 24/26 bid, she will be able to use a copay card to obtain it.  - Continue spironolactone 25 mg daily. BMET today.  - Continue to abstain from ETOH.  2. Suspected ETOH hepatitis: Elevated LFTs during 9/20 admission.  However liver imaging with [place] and CT has NOT shown cirrhosis or  portal/hepatic vein thrombosis. Question has arisen whether this might possibly be due primarily to RV failure.  - Needs followup with GI. 3.  LV thrombus: Possible cause of embolic event to popliteal arteries as well as subacute CVA.  - Continue warfarin.  If EF continues to rise, can likely stop in future.  4. PAD: Bilateral popliteal artery occlusions, suspect cardioembolic from LV thrombus. Now s/p mechanical thrombectomy by vascular.  Right foot wound followed by Dr. Sharol Given, has had skin graft and gradually healing.   5. CVA: Subacute right ACA CVA in 9/20. Likely from LV thrombus.  - Continue anticoagulation.  6. Right popliteal vein DVT: 9/20.  - Continue warfarin.   7. H/O GI bleeding: Upper GI bleeding, EGD with bleeding duodenal and gastric ulcer in 9/20 that were treated, no varices.  - Continue Protonix - Followup with GI.  8. H/O ETOH abuse: She has quit drinking now since August 2020.  This may be helping LV function.   9. CAD: Extensive distal and branch vessel coronary disease on cath 10/20.  Only possible interventional target was 80% proximal RCA stenosis.  owever, there was good flow down the RCA and I do not think intervention on this lesion would appreciably improve her LV function.  No chest pain.  - Continue Crestor 5 mg daily.  Good LDL in 5/21.   - Triglycerides controlled on Vascepa  - No ASA given warfarin use.   Followup in 4 months.   Loralie Champagne 04/30/2020

## 2020-05-03 ENCOUNTER — Encounter: Payer: Self-pay | Admitting: Orthopedic Surgery

## 2020-05-03 NOTE — Progress Notes (Signed)
Office Visit Note   Patient: Briana Andrews           Date of Birth: [birth year]           MRN: [id number] Visit Date: 04/29/2020              Requested by: No referring provider defined for this encounter. PCP: Patient, [name]  Chief Complaint  Patient presents with  . Right Foot - Follow-up      HPI: Patient presents in follow-up status post split-thickness skin graft dorsum of the right foot.  Patient states she is doing well she has some pain and has been using ibuprofen she is over 6 months out from surgery dressing change twice a day.  Assessment & Plan: Visit Diagnoses:  1. Cutaneous abscess of right foot     Plan: Continue with the compression stocking continue with Dial soap cleansing for wound care.  Follow-Up Instructions: Return in about 4 weeks (around 05/27/2020).   Ortho Exam  Patient is alert, oriented, no adenopathy, well-dressed, normal affect, normal respiratory effort. Examination patient's right foot has no cellulitis there is no odor no drainage the area of granulation tissue was 4 x 5 cm with 100% healthy granulation tissue.  Imaging: No results found. No images are attached to the encounter.  Labs: Lab Results  Component Value Date   HGBA1C 5.8 (H) 08/17/2019   ESRSEDRATE 0 08/16/2019   REPTSTATUS 08/19/2019 FINAL 08/18/2019   CULT  08/18/2019    NO GROWTH Performed at [facility], [address] [place], [place] [address]      Lab Results  Component Value Date   ALBUMIN 3.1 (L) 11/25/2019   ALBUMIN 2.6 (L) 10/15/2019   ALBUMIN 1.2 (L) 09/05/2019    Lab Results  Component Value Date   MG 2.1 09/08/2019   MG 1.4 (L) 09/07/2019   MG 1.9 09/02/2019   No results found for: VD25OH  No results found for: PREALBUMIN CBC EXTENDED Latest Ref Rng & Units 11/25/2019 10/15/2019 09/29/2019  WBC 4.0 - 10.5 K/uL 8.7 7.4 8.3  RBC 3.87 - 5.11 MIL/uL 3.62(L) 3.38(L) 3.21(L)  HGB 12.0 - 15.0 g/dL 11.2(L) 10.6(L) 10.2(L)  HCT 36.0 -  46.0 % 34.9(L) 32.8(L) 31.4(L)  PLT 150 - 400 K/uL 449(H) 535(H) 680(H)  NEUTROABS 1.7 - 7.7 K/uL - - -  LYMPHSABS 0.7 - 4.0 K/uL - - -     Body mass index is 27.36 kg/m.  Orders:  No orders of the defined types were placed in this encounter.  No orders of the defined types were placed in this encounter.    Procedures: No procedures performed  Clinical Data: No additional findings.  ROS:  All other systems negative, except as noted in the HPI. Review of Systems  Objective: Vital Signs: Ht 5\' 4"  (1.626 m)   Wt 159 lb 6.4 oz (72.3 kg)   BMI 27.36 kg/m   Specialty Comments:  No specialty comments available.  PMFS History: Patient Active Problem List   Diagnosis Date Noted  . Gangrene of right foot (HCC)   . Cutaneous abscess of right foot   . Endotracheally intubated   . Acute on chronic systolic CHF (congestive heart failure) (HCC)   . GI bleed 08/26/2019  . Pressure injury of skin 08/23/2019  . Acute upper GI bleed   . Shock circulatory (HCC)   . Palliative care by specialist   . Goals of care, counseling/discussion   . Advanced care planning/counseling  discussion   . Jaundice   . Cerebral embolism with cerebral infarction 08/17/2019  . PVD (peripheral vascular disease) ([place]) 08/17/2019  . Left ventricular apical thrombus without MI ([place]) 08/17/2019  . Acute CHF (congestive heart failure) ([place]) 08/17/2019  . Abnormal LFTs 08/16/2019  . Acute metabolic encephalopathy 05/39/7673  . SOB (shortness of breath) 08/16/2019  . AKI (acute kidney injury) ([place]) 08/16/2019  . Fall 08/16/2019  . Lactic acidosis 08/16/2019  . Back pain 08/16/2019  . Liver failure without hepatic coma ([place]) 08/16/2019  . Multifocal pneumonia 08/16/2019  . Alcohol abuse   . Tobacco abuse   . Shortness of breath    Past Medical History:  Diagnosis Date  . Alcohol abuse   . CHF (congestive heart failure) ([place])    last EF 35-40%. etoh induced  . Coronary artery disease    80%RCA,  50%LCX, 40%LAD  . Depression   . DVT (deep venous thrombosis) (HCC)    right popliteal vein (08/17/19)  . GERD (gastroesophageal reflux disease)   . Hyperlipidemia   . LV (left ventricular) mural thrombus   . Stroke [facility])    / memory loss  . Tobacco abuse     Family History  Problem Relation Age of Onset  . Heart disease Mother     Past Surgical History:  Procedure Laterality Date  . ABDOMINAL AORTOGRAM W/LOWER EXTREMITY N/A 08/19/2019   Procedure: ABDOMINAL AORTOGRAM W/LOWER EXTREMITY;  Surgeon: Serafina Mitchell, MD;  Location: [place] [facility];  Service: Cardiovascular;  Laterality: N/A;  . ABDOMINAL AORTOGRAM W/LOWER EXTREMITY Bilateral 08/20/2019   Procedure: ABDOMINAL AORTOGRAM W/LOWER EXTREMITY;  Surgeon: Marty Heck, MD;  Location: [place] [facility];  Service: Cardiovascular;  Laterality: Bilateral;  . DEBRIDEMENT  FOOT Right 10/15/2019  . ESOPHAGOGASTRODUODENOSCOPY N/A 08/26/2019   Procedure: ESOPHAGOGASTRODUODENOSCOPY (EGD);  Surgeon: Wilford Corner, MD;  Location: [place];  Service: Endoscopy;  Laterality: N/A;  . HOT HEMOSTASIS N/A 08/26/2019   Procedure: HOT HEMOSTASIS (ARGON PLASMA COAGULATION/BICAP);  Surgeon: Wilford Corner, MD;  Location: [place];  Service: Endoscopy;  Laterality: N/A;  . I & D EXTREMITY Right 10/15/2019   Procedure: RIGHT FOOT DEBRIDEMENT;  Surgeon: Newt Minion, MD;  Location: [place];  Service: Orthopedics;  Laterality: Right;  . PERIPHERAL VASCULAR BALLOON ANGIOPLASTY  08/19/2019   Procedure: PERIPHERAL VASCULAR BALLOON ANGIOPLASTY;  Surgeon: Serafina Mitchell, MD;  Location: [place] [facility] LAB;  Service: Cardiovascular;;  Right femoral popliteal and PT   . PERIPHERAL VASCULAR INTERVENTION Left 08/20/2019   Procedure: PERIPHERAL VASCULAR INTERVENTION;  Surgeon: Marty Heck, MD;  Location: [place] [facility] LAB;  Service: Cardiovascular;  Laterality: Left;  . RIGHT/LEFT HEART CATH AND CORONARY ANGIOGRAPHY N/A 09/02/2019    Procedure: RIGHT/LEFT HEART CATH AND CORONARY ANGIOGRAPHY;  Surgeon: Larey Dresser, MD;  Location: [place] [facility];  Service: Cardiovascular;  Laterality: N/A;  . SCLEROTHERAPY  08/26/2019   Procedure: SCLEROTHERAPY;  Surgeon: Wilford Corner, MD;  Location: [facility] ENDOSCOPY;  Service: Endoscopy;;   Social History   Occupational History  . Not on file  Tobacco Use  . Smoking status: Former Smoker    Years: 39.00    Quit date: 06/2019    Years since quitting: 0.8  . Smokeless tobacco: Never Used  Substance and Sexual Activity  . Alcohol use: Not Currently    Comment: alcoholism - last drink in august 2020  . Drug use: Not Currently    Types: Marijuana    Comment: none since August  2020  . Sexual activity: Not on file

## 2020-05-12 ENCOUNTER — Other Ambulatory Visit (HOSPITAL_COMMUNITY): Payer: Self-pay | Admitting: Cardiology

## 2020-05-12 ENCOUNTER — Telehealth (HOSPITAL_COMMUNITY): Payer: Self-pay | Admitting: Pharmacist

## 2020-05-12 MED ORDER — CARVEDILOL 6.25 MG PO TABS: 6.2500 mg | ORAL_TABLET | Freq: Two times a day (BID) | ORAL | 11 refills | Status: DC

## 2020-05-12 NOTE — Telephone Encounter (Signed)
Refill for carvedilol sent to [facility].

## 2020-05-19 ENCOUNTER — Other Ambulatory Visit: Payer: Self-pay

## 2020-05-19 ENCOUNTER — Ambulatory Visit (INDEPENDENT_AMBULATORY_CARE_PROVIDER_SITE_OTHER): Payer: 59

## 2020-05-19 DIAGNOSIS — I240 Acute coronary thrombosis not resulting in myocardial infarction: Secondary | ICD-10-CM | POA: Diagnosis not present

## 2020-05-19 DIAGNOSIS — Z5181 Encounter for therapeutic drug level monitoring: Secondary | ICD-10-CM

## 2020-05-19 LAB — POCT INR: INR: 3.2 — AB (ref 2.0–3.0)

## 2020-05-19 NOTE — Patient Instructions (Signed)
-   take 1/2 tablet warfarin tonight, have a serving of greens today or tomorrow, then  - continue dosage of warfarin 5 mg every day EXCEPT 1.5 tablets on MONDAYS & FRIDAYS. - Recheck INR in 6 wks

## 2020-05-27 ENCOUNTER — Other Ambulatory Visit: Payer: Self-pay

## 2020-05-27 ENCOUNTER — Encounter: Payer: Self-pay | Admitting: Orthopedic Surgery

## 2020-05-27 ENCOUNTER — Ambulatory Visit (INDEPENDENT_AMBULATORY_CARE_PROVIDER_SITE_OTHER): Payer: 59 | Admitting: Orthopedic Surgery

## 2020-05-27 VITALS — Ht 64.0 in | Wt 159.0 lb

## 2020-05-27 DIAGNOSIS — L02611 Cutaneous abscess of right foot: Secondary | ICD-10-CM

## 2020-05-27 NOTE — Progress Notes (Signed)
Office Visit Note   Patient: Briana Andrews           Date of Birth: 1966/10/31           MRN: [id number] Visit Date: 05/27/2020              Requested by: No referring provider defined for this encounter. PCP: Patient, [name]  Chief Complaint  Patient presents with  . Left Foot - Follow-up    10/15/19 foot debridement and STSG       HPI: Patient is status post split thickness skin grafting to the dorsum of her right foot she is overall doing well and is without complaints.  She does wear the compression sock although has taken it off at night because it is quite warm  Assessment & Plan: Visit Diagnoses: No diagnosis found.  Plan: She will follow up in 1 month continue to use the sock  Follow-Up Instructions: No follow-ups on file.   Ortho Exam  Patient is alert, oriented, no adenopathy, well-dressed, normal affect, normal respiratory effort. Dorsum of right foot excellent ingrowth of skin.  Vascular healthy wound bed.  Minimal drainage no foul odor no surrounding cellulitis.  She has no swelling.  Imaging: No results found. No images are attached to the encounter.  Labs: Lab Results  Component Value Date   HGBA1C 5.8 (H) 08/17/2019   ESRSEDRATE 0 08/16/2019   REPTSTATUS 08/19/2019 FINAL 08/18/2019   CULT  08/18/2019    NO GROWTH Performed at [facility], [address] [place], [place] [address]      Lab Results  Component Value Date   ALBUMIN 3.1 (L) 11/25/2019   ALBUMIN 2.6 (L) 10/15/2019   ALBUMIN 1.2 (L) 09/05/2019    Lab Results  Component Value Date   MG 2.1 09/08/2019   MG 1.4 (L) 09/07/2019   MG 1.9 09/02/2019   No results found for: VD25OH  No results found for: PREALBUMIN CBC EXTENDED Latest Ref Rng & Units 11/25/2019 10/15/2019 09/29/2019  WBC 4.0 - 10.5 K/uL 8.7 7.4 8.3  RBC 3.87 - 5.11 MIL/uL 3.62(L) 3.38(L) 3.21(L)  HGB 12.0 - 15.0 g/dL 11.2(L) 10.6(L) 10.2(L)  HCT 36 - 46 % 34.9(L) 32.8(L) 31.4(L)  PLT 150 - 400 K/uL  449(H) 535(H) 680(H)  NEUTROABS 1.7 - 7.7 K/uL - - -  LYMPHSABS 0.7 - 4.0 K/uL - - -     Body mass index is 27.29 kg/m.  Orders:  No orders of the defined types were placed in this encounter.  No orders of the defined types were placed in this encounter.    Procedures: No procedures performed  Clinical Data: No additional findings.  ROS:  All other systems negative, except as noted in the HPI. Review of Systems  Objective: Vital Signs: Ht 5\' 4"  (1.626 m)   Wt 159 lb (72.1 kg)   BMI 27.29 kg/m   Specialty Comments:  No specialty comments available.  PMFS History: Patient Active Problem List   Diagnosis Date Noted  . Gangrene of right foot (HCC)   . Cutaneous abscess of right foot   . Endotracheally intubated   . Acute on chronic systolic CHF (congestive heart failure) (HCC)   . GI bleed 08/26/2019  . Pressure injury of skin 08/23/2019  . Acute upper GI bleed   . Shock circulatory (HCC)   . Palliative care by specialist   . Goals of care, counseling/discussion   . Advanced care planning/counseling discussion   . Jaundice   .  Cerebral embolism with cerebral infarction 08/17/2019  . PVD (peripheral vascular disease) (HCC) 08/17/2019  . 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 Acute CHF (congestive heart failure) (HCC) 08/17/2019  . Abnormal LFTs 08/16/2019  . Acute metabolic encephalopathy 08/16/2019  . SOB (shortness of breath) 08/16/2019  . AKI (acute kidney injury) (HCC) 08/16/2019  . Fall 08/16/2019  . Lactic acidosis 08/16/2019  . Back pain 08/16/2019  . Liver failure without hepatic coma (HCC) 08/16/2019  . Multifocal pneumonia 08/16/2019  . Alcohol abuse   . Tobacco abuse   . Shortness of breath    Past Medical History:  Diagnosis Date  . Alcohol abuse   . CHF (congestive heart failure) (HCC)    last EF 35-40%. etoh induced  . Coronary artery disease    80%RCA, 50%LCX, 40%LAD  . Depression   . DVT (deep venous  thrombosis) (HCC)    right popliteal vein (08/17/19)  . GERD (gastroesophageal reflux disease)   . Hyperlipidemia   . LV (left ventricular) mural thrombus   . Stroke [facility])    / memory loss  . Tobacco abuse     Family History  Problem Relation Age of Onset  . Heart disease Mother     Past Surgical History:  Procedure Laterality Date  . ABDOMINAL AORTOGRAM W/LOWER EXTREMITY N/A 08/19/2019   Procedure: ABDOMINAL AORTOGRAM W/LOWER EXTREMITY;  Surgeon: Nada Libman, MD;  [facility];  Service: Cardiovascular;  Laterality: N/A;  . ABDOMINAL AORTOGRAM W/LOWER EXTREMITY Bilateral 08/20/2019   Procedure: ABDOMINAL AORTOGRAM W/LOWER EXTREMITY;  Surgeon: Cephus Shelling, MD;  Location: [facility];  Service: Cardiovascular;  Laterality: Bilateral;  . DEBRIDEMENT  FOOT Right 10/15/2019  . ESOPHAGOGASTRODUODENOSCOPY N/A 08/26/2019   Procedure: ESOPHAGOGASTRODUODENOSCOPY (EGD);  Surgeon: Charlott Rakes, MD;  Location: [facility] ENDOSCOPY;  Service: Endoscopy;  Laterality: N/A;  . HOT HEMOSTASIS N/A 08/26/2019   Procedure: HOT HEMOSTASIS (ARGON PLASMA COAGULATION/BICAP);  Surgeon: Charlott Rakes, MD;  Location: [facility] ENDOSCOPY;  Service: Endoscopy;  Laterality: N/A;  . I & D EXTREMITY Right 10/15/2019   Procedure: RIGHT FOOT DEBRIDEMENT;  Surgeon: Nadara Mustard, MD;  Location: [facility] OR;  Service: Orthopedics;  Laterality: Right;  . PERIPHERAL VASCULAR BALLOON ANGIOPLASTY  08/19/2019   Procedure: PERIPHERAL VASCULAR BALLOON ANGIOPLASTY;  Surgeon: Nada Libman, MD;  [facility];  Service: Cardiovascular;;  Right femoral popliteal and PT   . PERIPHERAL VASCULAR INTERVENTION Left 08/20/2019   Procedure: PERIPHERAL VASCULAR INTERVENTION;  Surgeon: Cephus Shelling, MD;  Location: [facility] INVASIVE CV LAB;  Service: Cardiovascular;  Laterality: Left;  . RIGHT/LEFT HEART CATH AND CORONARY ANGIOGRAPHY N/A 09/02/2019   Procedure: RIGHT/LEFT HEART CATH AND CORONARY  ANGIOGRAPHY;  Surgeon: Laurey Morale, MD;  Location: [facility] INVASIVE CV LAB;  Service: Cardiovascular;  Laterality: N/A;  . SCLEROTHERAPY  08/26/2019   Procedure: SCLEROTHERAPY;  Surgeon: Charlott Rakes, MD;  Location: [facility] ENDOSCOPY;  Service: Endoscopy;;   Social History   Occupational History  . Not on file  Tobacco Use  . Smoking status: Former Smoker    Years: 39.00    Quit date: 06/2019    Years since quitting: 0.9  . Smokeless tobacco: Never Used  Vaping Use  . Vaping Use: Never used  Substance and Sexual Activity  . Alcohol use: Not Currently    Comment: alcoholism - last drink in august 2020  . Drug use: Not Currently    Types: Marijuana    Comment: none since  August 2020  . Sexual activity: Not on file

## 2020-06-24 ENCOUNTER — Ambulatory Visit (INDEPENDENT_AMBULATORY_CARE_PROVIDER_SITE_OTHER): Payer: 59 | Admitting: Physician Assistant

## 2020-06-24 ENCOUNTER — Encounter: Payer: Self-pay | Admitting: Orthopedic Surgery

## 2020-06-24 VITALS — Ht 64.0 in | Wt 159.0 lb

## 2020-06-24 DIAGNOSIS — L02611 Cutaneous abscess of right foot: Secondary | ICD-10-CM

## 2020-06-24 NOTE — Progress Notes (Signed)
Office Visit Note   Patient: Briana Andrews           Date of Birth: 03/21/1966           MRN: [id number] Visit Date: 06/24/2020              Requested by: No referring provider defined for this encounter. PCP: Patient, [name]  Chief Complaint  Patient presents with   Left Foot - Follow-up    10/15/19 foot debridement and STSG       HPI: Patient presents in follow-up today she is 7 months status post left foot debridement split-thickness skin graft she is doing very very well she overall feels good she did go swimming a little bit wants to know if this is okay it was in a private pool  Assessment & Plan: Visit Diagnoses: No diagnosis found.  Plan: Continue with current plan.  I did tell her I do not want her spending excessive time in the pool but if she needs to depend she can she just needs to make sure she washes her foot good with water and covers it and dries it.  Follow-up in 1 month.    Follow-Up Instructions: No follow-ups on file.   Ortho Exam  Patient is alert, oriented, no adenopathy, well-dressed, normal affect, normal respiratory effort. Focused examination demonstrates graft continues to heal and she does have a small amount of hyper granulation tissue which was debrided with a silver nitrate stick.  Good vascular wound bed no surrounding cellulitis swelling is well controlled her compression stocking was placed back on her foot  Imaging: No results found. No images are attached to the encounter.  Labs: Lab Results  Component Value Date   HGBA1C 5.8 (H) 08/17/2019   ESRSEDRATE 0 08/16/2019   REPTSTATUS 08/19/2019 FINAL 08/18/2019   CULT  08/18/2019    NO GROWTH Performed at [facility], [address] [place], [place] [address]      Lab Results  Component Value Date   ALBUMIN 3.1 (L) 11/25/2019   ALBUMIN 2.6 (L) 10/15/2019   ALBUMIN 1.2 (L) 09/05/2019    Lab Results  Component Value Date   MG 2.1 09/08/2019   MG 1.4 (L)  09/07/2019   MG 1.9 09/02/2019   No results found for: VD25OH  No results found for: PREALBUMIN CBC EXTENDED Latest Ref Rng & Units 11/25/2019 10/15/2019 09/29/2019  WBC 4.0 - 10.5 K/uL 8.7 7.4 8.3  RBC 3.87 - 5.11 MIL/uL 3.62(L) 3.38(L) 3.21(L)  HGB 12.0 - 15.0 g/dL 11.2(L) 10.6(L) 10.2(L)  HCT 36 - 46 % 34.9(L) 32.8(L) 31.4(L)  PLT 150 - 400 K/uL 449(H) 535(H) 680(H)  NEUTROABS 1.7 - 7.7 K/uL - - -  LYMPHSABS 0.7 - 4.0 K/uL - - -     Body mass index is 27.29 kg/m.  Orders:  No orders of the defined types were placed in this encounter.  No orders of the defined types were placed in this encounter.    Procedures: No procedures performed  Clinical Data: No additional findings.  ROS:  All other systems negative, except as noted in the HPI. Review of Systems  Objective: Vital Signs: Ht 5\' 4"  (1.626 m)    Wt 159 lb (72.1 kg)    BMI 27.29 kg/m   Specialty Comments:  No specialty comments available.  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 etoh induced   Coronary artery disease    80%RCA, 50%LCX, 40%LAD   Depression    DVT (deep venous thrombosis) (HCC)    right popliteal vein (08/17/19)   GERD (gastroesophageal reflux disease)    Hyperlipidemia    LV (left ventricular) mural thrombus    Stroke [facility])    / memory loss   Tobacco abuse     Family History  Problem Relation Age of Onset   Heart disease Mother     Past Surgical History:  Procedure Laterality Date   ABDOMINAL AORTOGRAM W/LOWER EXTREMITY N/A 08/19/2019   Procedure: ABDOMINAL AORTOGRAM W/LOWER EXTREMITY;  Surgeon: Nada Libman, MD;  [facility];  Service: Cardiovascular;  Laterality: N/A;   ABDOMINAL AORTOGRAM W/LOWER EXTREMITY Bilateral 08/20/2019   Procedure: ABDOMINAL AORTOGRAM W/LOWER EXTREMITY;  Surgeon: Cephus Shelling, MD;  [facility];  Service: Cardiovascular;  Laterality: Bilateral;   DEBRIDEMENT  FOOT Right 10/15/2019   ESOPHAGOGASTRODUODENOSCOPY N/A 08/26/2019   Procedure: ESOPHAGOGASTRODUODENOSCOPY (EGD);  Surgeon: Charlott Rakes, MD;  Location: [facility] ENDOSCOPY;  Service: Endoscopy;  Laterality: N/A;   HOT HEMOSTASIS N/A 08/26/2019   Procedure: HOT HEMOSTASIS (ARGON PLASMA COAGULATION/BICAP);  Surgeon: Charlott Rakes, MD;  Location: [facility] ENDOSCOPY;  Service: Endoscopy;  Laterality: N/A;   I & D EXTREMITY Right 10/15/2019   Procedure: RIGHT FOOT DEBRIDEMENT;  Surgeon: Nadara Mustard, MD;  Location: [facility] OR;  Service: Orthopedics;  Laterality: Right;   PERIPHERAL VASCULAR BALLOON ANGIOPLASTY  08/19/2019   Procedure: PERIPHERAL VASCULAR BALLOON ANGIOPLASTY;  Surgeon: Nada Libman, MD;  [facility];  Service: Cardiovascular;;  Right  femoral popliteal and PT    PERIPHERAL VASCULAR INTERVENTION Left 08/20/2019   Procedure: PERIPHERAL VASCULAR INTERVENTION;  Surgeon: Cephus Shelling, MD;  [facility];  Service: Cardiovascular;  Laterality: Left;   RIGHT/LEFT HEART CATH AND CORONARY ANGIOGRAPHY N/A 09/02/2019   Procedure: RIGHT/LEFT HEART CATH AND CORONARY ANGIOGRAPHY;  Surgeon: Laurey Morale, MD;  Location: [facility] INVASIVE CV LAB;  Service: Cardiovascular;  Laterality: N/A;   SCLEROTHERAPY  08/26/2019   Procedure: SCLEROTHERAPY;  Surgeon: Charlott Rakes, MD;  Location: [facility] ENDOSCOPY;  Service: Endoscopy;;   Social History   Occupational History   Not on file  Tobacco Use   Smoking status: Former Smoker  Years: 39.00    Quit date: 06/2019    Years since quitting: 0.9   Smokeless tobacco: Never Used  Vaping Use   Vaping Use: Never used  Substance and Sexual Activity   Alcohol use: Not Currently    Comment: alcoholism - last drink in august 2020   Drug use: Not Currently    Types: Marijuana    Comment: none since August 2020   Sexual activity: Not on file

## 2020-06-30 ENCOUNTER — Ambulatory Visit (INDEPENDENT_AMBULATORY_CARE_PROVIDER_SITE_OTHER): Payer: 59

## 2020-06-30 ENCOUNTER — Other Ambulatory Visit: Payer: Self-pay

## 2020-06-30 DIAGNOSIS — Z5181 Encounter for therapeutic drug level monitoring: Secondary | ICD-10-CM

## 2020-06-30 DIAGNOSIS — I240 Acute coronary thrombosis not resulting in myocardial infarction: Secondary | ICD-10-CM

## 2020-06-30 LAB — POCT INR: INR: 3.7 — AB (ref 2.0–3.0)

## 2020-06-30 NOTE — Patient Instructions (Signed)
-   skip warfarin tonight, - have a serving of greens today - START NEW DOSAGE of warfarin 5 mg every day - Recheck INR in 6 weeks

## 2020-07-22 ENCOUNTER — Ambulatory Visit (INDEPENDENT_AMBULATORY_CARE_PROVIDER_SITE_OTHER): Payer: 59 | Admitting: Orthopedic Surgery

## 2020-07-22 ENCOUNTER — Encounter: Payer: Self-pay | Admitting: Orthopedic Surgery

## 2020-07-22 DIAGNOSIS — L02611 Cutaneous abscess of right foot: Secondary | ICD-10-CM

## 2020-07-22 NOTE — Progress Notes (Signed)
Office Visit Note   Patient: Briana Andrews           Date of Birth: Oct 14, 1966           MRN: [id number] Visit Date: 07/22/2020              Requested by: No referring provider defined for this encounter. PCP: Patient, [name]  Chief Complaint  Patient presents with  . Right Foot - Pain, Follow-up      HPI: Patient is a 54 year old woman who presents status post debridement skin graft dorsum right foot she is currently in a postoperative shoe she states she does have some stabbing pain at times she has been using ibuprofen.  She states she changes the dressing twice a day and is wearing the knee-high compression stockings.  Assessment & Plan: Visit Diagnoses:  1. Cutaneous abscess of right foot     Plan: Continue with current care patient is showing slow steady improvement.  Follow-Up Instructions: Return in about 4 weeks (around 08/19/2020).   Ortho Exam  Patient is alert, oriented, no adenopathy, well-dressed, normal affect, normal respiratory effort. Examination patient has a triangular wound on the dorsum of the left foot with healthy granulation tissue that measures 2 x 4 cm and 1 mm deep there is no cellulitis no drainage no tenderness no signs of infection.  Imaging: No results found. No images are attached to the encounter.  Labs: Lab Results  Component Value Date   HGBA1C 5.8 (H) 08/17/2019   ESRSEDRATE 0 08/16/2019   REPTSTATUS 08/19/2019 FINAL 08/18/2019   CULT  08/18/2019    NO GROWTH Performed at [facility], [address], [place], [place] [address]      Lab Results  Component Value Date   ALBUMIN 3.1 (L) 11/25/2019   ALBUMIN 2.6 (L) 10/15/2019   ALBUMIN 1.2 (L) 09/05/2019    Lab Results  Component Value Date   MG 2.1 09/08/2019   MG 1.4 (L) 09/07/2019   MG 1.9 09/02/2019   No results found for: VD25OH  No results found for: PREALBUMIN CBC EXTENDED Latest Ref Rng & Units 11/25/2019 10/15/2019 09/29/2019  WBC 4.0 - 10.5 K/uL  8.7 7.4 8.3  RBC 3.87 - 5.11 MIL/uL 3.62(L) 3.38(L) 3.21(L)  HGB 12.0 - 15.0 g/dL 11.2(L) 10.6(L) 10.2(L)  HCT 36 - 46 % 34.9(L) 32.8(L) 31.4(L)  PLT 150 - 400 K/uL 449(H) 535(H) 680(H)  NEUTROABS 1.7 - 7.7 K/uL - - -  LYMPHSABS 0.7 - 4.0 K/uL - - -     There is no height or weight on file to calculate BMI.  Orders:  No orders of the defined types were placed in this encounter.  No orders of the defined types were placed in this encounter.    Procedures: No procedures performed  Clinical Data: No additional findings.  ROS:  All other systems negative, except as noted in the HPI. Review of Systems  Objective: Vital Signs: There were no vitals taken for this visit.  Specialty Comments:  No specialty comments available.  PMFS History: Patient Active Problem List   Diagnosis Date Noted  . Gangrene of right foot (HCC)   . Cutaneous abscess of right foot   . Endotracheally intubated   . Acute on chronic systolic CHF (congestive heart failure) (HCC)   . GI bleed 08/26/2019  . Pressure injury of skin 08/23/2019  . Acute upper GI bleed   . Shock circulatory (HCC)   . Palliative care by specialist   .  Goals of care, counseling/discussion   . Advanced care planning/counseling discussion   . Jaundice   . Cerebral embolism with cerebral infarction 08/17/2019  . PVD (peripheral vascular disease) (HCC) 08/17/2019  . 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 Acute CHF (congestive heart failure) (HCC) 08/17/2019  . Abnormal LFTs 08/16/2019  . Acute metabolic encephalopathy 08/16/2019  . SOB (shortness of breath) 08/16/2019  . AKI (acute kidney injury) (HCC) 08/16/2019  . Fall 08/16/2019  . Lactic acidosis 08/16/2019  . Back pain 08/16/2019  . Liver failure without hepatic coma (HCC) 08/16/2019  . Multifocal pneumonia 08/16/2019  . Alcohol abuse   . Tobacco abuse   . Shortness of breath    Past Medical History:  Diagnosis Date  . Alcohol abuse    . CHF (congestive heart failure) (HCC)    last EF 35-40%. etoh induced  . Coronary artery disease    80%RCA, 50%LCX, 40%LAD  . Depression   . DVT (deep venous thrombosis) (HCC)    right popliteal vein (08/17/19)  . GERD (gastroesophageal reflux disease)   . Hyperlipidemia   . LV (left ventricular) mural thrombus   . Stroke [facility])    / memory loss  . Tobacco abuse     Family History  Problem Relation Age of Onset  . Heart disease Mother     Past Surgical History:  Procedure Laterality Date  . ABDOMINAL AORTOGRAM W/LOWER EXTREMITY N/A 08/19/2019   Procedure: ABDOMINAL AORTOGRAM W/LOWER EXTREMITY;  Surgeon: Nada Libman, MD;  Location: [facility] INVASIVE CV LAB;  Service: Cardiovascular;  Laterality: N/A;  . ABDOMINAL AORTOGRAM W/LOWER EXTREMITY Bilateral 08/20/2019   Procedure: ABDOMINAL AORTOGRAM W/LOWER EXTREMITY;  Surgeon: Cephus Shelling, MD;  Location: [facility]VAS[facility] CV LAB;  Service: Cardiovascular;  Laterality: Bilateral;  . DEBRIDEMENT  FOOT Right 10/15/2019  . ESOPHAGOGASTRODUODENOSCOPY N/A 08/26/2019   Procedure: ESOPHAGOGASTRODUODENOSCOPY (EGD);  Surgeon: Charlott Rakes, MD;  Location[facility] ENDOSCOPY;  Service: Endoscopy;  Laterality: N/A;  . HOT HEMOSTASIS N/A 08/26/2019   Procedure: HOT HEMOSTASIS (ARGON PLASMA COAGULATION/BICAP);  Surgeon: Charlott Rakes, MD;  Location: [facility] ENDOSCOPY;  Service: Endoscopy;  Laterality: N/A;  . I & D EXTREMITY Right 10/15/2019   Procedure: RIGHT FOOT DEBRIDEMENT;  Surgeon: Nadara Mustard, MD;  Location: [facility] OR;  Service: Orthopedics;  Laterality: Right;  . PERIPHERAL VASCULAR BALLOON ANGIOPLASTY  08/19/2019   Procedure: PERIPHERAL VASCULAR BALLOON ANGIOPLASTY;  Surgeon: Nada Libman, MD;  Location: [facility] LAB;  Service: Cardiovascular;;  Right femoral popliteal and PT   . PERIPHERAL VASCULAR INTERVENTION Left 08/20/2019   Procedure: PERIPHERAL VASCULAR INTERVENTION;  Surgeon: Cephus Shelling, MD;  Location: [facility] INVASIVE CV LAB;   Service: Cardiovascular;  Laterality: Left;  . RIGHT/LEFT HEART CATH AND CORONARY ANGIOGRAPHY N/A 09/02/2019   Procedure: RIGHT/LEFT HEART CATH AND CORONARY ANGIOGRAPHY;  Surgeon: Laurey Morale, MD;  Location: [facility] INVASIVE CV LAB;  Service: Cardiovascular;  Laterality: N/A;  . SCLEROTHERAPY  08/26/2019   Procedure: SCLEROTHERAPY;  Surgeon: Charlott Rakes, MD;  Location: [facility] ENDOSCOPY;  Service: Endoscopy;;   Social History   Occupational History  . Not on file  Tobacco Use  . Smoking status: Former Smoker    Years: 39.00    Quit date: 06/2019    Years since quitting: 1.0  . Smokeless tobacco: Never Used  Vaping Use  . Vaping Use: Never used  Substance and Sexual Activity  . Alcohol use: Not Currently    Comment: alcoholism - last drink in august  2020  . Drug use: Not Currently    Types: Marijuana    Comment: none since August 2020  . Sexual activity: Not on file

## 2020-08-06 ENCOUNTER — Other Ambulatory Visit: Payer: Self-pay | Admitting: Internal Medicine

## 2020-08-06 MED ORDER — WARFARIN SODIUM 5 MG PO TABS: ORAL_TABLET | ORAL | 0 refills | Status: DC

## 2020-08-06 NOTE — Telephone Encounter (Signed)
*  STAT* If patient is at the pharmacy, call can be transferred to refill team.   1. Which medications need to be refilled? (please list name of each medication and dose if known)   Warfarin 5 mg po take 1 -1.5 tabs as directed   2. Which pharmacy/location (including street and city if local pharmacy) is medication to be sent to?  [facility] [address] [place]   3. Do they need a 30 day or 90 day supply? 90

## 2020-08-11 ENCOUNTER — Other Ambulatory Visit: Payer: Self-pay

## 2020-08-11 ENCOUNTER — Ambulatory Visit (INDEPENDENT_AMBULATORY_CARE_PROVIDER_SITE_OTHER): Payer: 59

## 2020-08-11 DIAGNOSIS — Z5181 Encounter for therapeutic drug level monitoring: Secondary | ICD-10-CM | POA: Diagnosis not present

## 2020-08-11 DIAGNOSIS — I240 Acute coronary thrombosis not resulting in myocardial infarction: Secondary | ICD-10-CM | POA: Diagnosis not present

## 2020-08-11 LAB — POCT INR: INR: 2.7 (ref 2.0–3.0)

## 2020-08-11 NOTE — Patient Instructions (Signed)
-   continue dosage of warfarin 5 mg every day - Recheck INR in 6 weeks

## 2020-08-26 ENCOUNTER — Ambulatory Visit (INDEPENDENT_AMBULATORY_CARE_PROVIDER_SITE_OTHER): Payer: 59 | Admitting: Physician Assistant

## 2020-08-26 ENCOUNTER — Encounter: Payer: Self-pay | Admitting: Orthopedic Surgery

## 2020-08-26 ENCOUNTER — Other Ambulatory Visit: Payer: Self-pay

## 2020-08-26 DIAGNOSIS — I96 Gangrene, not elsewhere classified: Secondary | ICD-10-CM

## 2020-08-26 NOTE — Progress Notes (Signed)
Office Visit Note   Patient: Briana Andrews           Date of Birth: 08-Mar-1966           MRN: [id number] Visit Date: 08/26/2020              Requested by: No referring provider defined for this encounter. PCP: Patient, [name]  Chief Complaint  Patient presents with  . Right Foot - Follow-up      HPI: Patient is here in follow-up for her right foot wound.  She is status post skin graft and continues to have excellent epithelialization.  No complaints she is wearing her compression sock  Assessment & Plan: Visit Diagnoses: No diagnosis found.  Plan: She will follow up in a month  Follow-Up Instructions: No follow-ups on file.   Ortho Exam  Patient is alert, oriented, no adenopathy, well-dressed, normal affect, normal respiratory effort. Right foot excellent epithelialization.  No drainage no foul odor healthy fibrous tissue.  No cellulitis  Imaging: No results found. No images are attached to the encounter.  Labs: Lab Results  Component Value Date   HGBA1C 5.8 (H) 08/17/2019   ESRSEDRATE 0 08/16/2019   REPTSTATUS 08/19/2019 FINAL 08/18/2019   CULT  08/18/2019    NO GROWTH Performed at [facility], [address], [place], [place] [address]      Lab Results  Component Value Date   ALBUMIN 3.1 (L) 11/25/2019   ALBUMIN 2.6 (L) 10/15/2019   ALBUMIN 1.2 (L) 09/05/2019    Lab Results  Component Value Date   MG 2.1 09/08/2019   MG 1.4 (L) 09/07/2019   MG 1.9 09/02/2019   No results found for: VD25OH  No results found for: PREALBUMIN CBC EXTENDED Latest Ref Rng & Units 11/25/2019 10/15/2019 09/29/2019  WBC 4.0 - 10.5 K/uL 8.7 7.4 8.3  RBC 3.87 - 5.11 MIL/uL 3.62(L) 3.38(L) 3.21(L)  HGB 12.0 - 15.0 g/dL 11.2(L) 10.6(L) 10.2(L)  HCT 36 - 46 % 34.9(L) 32.8(L) 31.4(L)  PLT 150 - 400 K/uL 449(H) 535(H) 680(H)  NEUTROABS 1.7 - 7.7 K/uL - - -  LYMPHSABS 0.7 - 4.0 K/uL - - -     There is no height or weight on file to calculate BMI.  Orders:    No orders of the defined types were placed in this encounter.  No orders of the defined types were placed in this encounter.    Procedures: No procedures performed  Clinical Data: No additional findings.  ROS:  All other systems negative, except as noted in the HPI. Review of Systems  Objective: Vital Signs: There were no vitals taken for this visit.  Specialty Comments:  No specialty comments available.  PMFS History: Patient Active Problem List   Diagnosis Date Noted  . Gangrene of right foot (HCC)   . Cutaneous abscess of right foot   . Endotracheally intubated   . Acute on chronic systolic CHF (congestive heart failure) (HCC)   . GI bleed 08/26/2019  . Pressure injury of skin 08/23/2019  . Acute upper GI bleed   . Shock circulatory (HCC)   . Palliative care by specialist   . Goals of care, counseling/discussion   . Advanced care planning/counseling discussion   . Jaundice   . Cerebral embolism with cerebral infarction 08/17/2019  . PVD (peripheral vascular disease) (HCC) 08/17/2019  . 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 Acute CHF (congestive heart failure) (HCC) 08/17/2019  . Abnormal LFTs  08/16/2019  . Acute metabolic encephalopathy 08/16/2019  . SOB (shortness of breath) 08/16/2019  . AKI (acute kidney injury) (HCC) 08/16/2019  . Fall 08/16/2019  . Lactic acidosis 08/16/2019  . Back pain 08/16/2019  . Liver failure without hepatic coma (HCC) 08/16/2019  . Multifocal pneumonia 08/16/2019  . Alcohol abuse   . Tobacco abuse   . Shortness of breath    Past Medical History:  Diagnosis Date  . Alcohol abuse   . CHF (congestive heart failure) (HCC)    last EF 35-40%. etoh induced  . Coronary artery disease    80%RCA, 50%LCX, 40%LAD  . Depression   . DVT (deep venous thrombosis) (HCC)    right popliteal vein (08/17/19)  . GERD (gastroesophageal reflux disease)   . Hyperlipidemia   . LV (left ventricular) mural thrombus   .  Stroke [facility])    / memory loss  . Tobacco abuse     Family History  Problem Relation Age of Onset  . Heart disease Mother     Past Surgical History:  Procedure Laterality Date  . ABDOMINAL AORTOGRAM W/LOWER EXTREMITY N/A 08/19/2019   Procedure: ABDOMINAL AORTOGRAM W/LOWER EXTREMITY;  Surgeon: Nada Libman, MD;  Location: [facility]VAS[facility] LAB;  Service: Cardiovascular;  Laterality: N/A;  . ABDOMINAL AORTOGRAM W/LOWER EXTREMITY Bilateral 08/20/2019   Procedure: ABDOMINAL AORTOGRAM W/LOWER EXTREMITY;  Surgeon: Cephus Shelling, MD;  Location: [facility] LAB;  Service: Cardiovascular;  Laterality: Bilateral;  . DEBRIDEMENT  FOOT Right 10/15/2019  . ESOPHAGOGASTRODUODENOSCOPY N/A 08/26/2019   Procedure: ESOPHAGOGASTRODUODENOSCOPY (EGD);  Surgeon: Charlott Rakes, MD;  Location: [facility] ENDOSCOPY;  Service: Endoscopy;  Laterality: N/A;  . HOT HEMOSTASIS N/A 08/26/2019   Procedure: HOT HEMOSTASIS (ARGON PLASMA COAGULATION/BICAP);  Surgeon: Charlott Rakes, MD;  Location: [facility] ENDOSCOPY;  Service: Endoscopy;  Laterality: N/A;  . I & D EXTREMITY Right 10/15/2019   Procedure: RIGHT FOOT DEBRIDEMENT;  Surgeon: Nadara Mustard, MD;  Location: [facility] OR;  Service: Orthopedics;  Laterality: Right;  . PERIPHERAL VASCULAR BALLOON ANGIOPLASTY  08/19/2019   Procedure: PERIPHERAL VASCULAR BALLOON ANGIOPLASTY;  Surgeon: Nada Libman, MD;  Location: [facility];  Service: Cardiovascular;;  Right femoral popliteal and PT   . PERIPHERAL VASCULAR INTERVENTION Left 08/20/2019   Procedure: PERIPHERAL VASCULAR INTERVENTION;  Surgeon: Cephus Shelling, MD;  Location: [facility] INVASIVE CV LAB;  Service: Cardiovascular;  Laterality: Left;  . RIGHT/LEFT HEART CATH AND CORONARY ANGIOGRAPHY N/A 09/02/2019   Procedure: RIGHT/LEFT HEART CATH AND CORONARY ANGIOGRAPHY;  Surgeon: Laurey Morale, MD;  Location: [facility] INVASIVE CV LAB;  Service: Cardiovascular;  Laterality: N/A;  . SCLEROTHERAPY  08/26/2019   Procedure:  SCLEROTHERAPY;  Surgeon: Charlott Rakes, MD;  Location: [facility] ENDOSCOPY;  Service: Endoscopy;;   Social History   Occupational History  . Not on file  Tobacco Use  . Smoking status: Former Smoker    Years: 39.00    Quit date: 06/2019    Years since quitting: 1.1  . Smokeless tobacco: Never Used  Vaping Use  . Vaping Use: Never used  Substance and Sexual Activity  . Alcohol use: Not Currently    Comment: alcoholism - last drink in august 2020  . Drug use: Not Currently    Types: Marijuana    Comment: none since August 2020  . Sexual activity: Not on file

## 2020-08-31 ENCOUNTER — Telehealth (HOSPITAL_COMMUNITY): Payer: Self-pay | Admitting: Pharmacist

## 2020-08-31 MED ORDER — SACUBITRIL-VALSARTAN 24-26 MG PO TABS: 1.0000 | ORAL_TABLET | Freq: Two times a day (BID) | ORAL | 11 refills | Status: DC

## 2020-08-31 NOTE — Telephone Encounter (Signed)
Refill for [facility] sent to [facility]

## 2020-09-01 ENCOUNTER — Telehealth (HOSPITAL_COMMUNITY): Payer: Self-pay | Admitting: Pharmacy Technician

## 2020-09-01 NOTE — Telephone Encounter (Signed)
Patient called in stating that her Sherryll Burger co-pay was high. Called her pharmacy and they did not apply the [facility] co-pay card. The representative was able to find and apply the co-pay card and the co-pay is now $10.  Called and updated the patient. I reminded her to make sure she tells the pharmacy that she has a co-pay card to use as well.  Archer Asa, CPhT

## 2020-09-22 ENCOUNTER — Ambulatory Visit (INDEPENDENT_AMBULATORY_CARE_PROVIDER_SITE_OTHER): Payer: 59

## 2020-09-22 ENCOUNTER — Other Ambulatory Visit: Payer: Self-pay

## 2020-09-22 DIAGNOSIS — Z5181 Encounter for therapeutic drug level monitoring: Secondary | ICD-10-CM | POA: Diagnosis not present

## 2020-09-22 DIAGNOSIS — I240 Acute coronary thrombosis not resulting in myocardial infarction: Secondary | ICD-10-CM | POA: Diagnosis not present

## 2020-09-22 LAB — POCT INR: INR: 1.8 — AB (ref 2.0–3.0)

## 2020-09-22 NOTE — Patient Instructions (Signed)
-   take 2 tablets warfarin tonight, then  - continue dosage of warfarin 5 mg every day - Recheck INR in 6 weeks

## 2020-09-27 ENCOUNTER — Ambulatory Visit: Payer: 59 | Admitting: Physician Assistant

## 2020-09-29 ENCOUNTER — Other Ambulatory Visit (HOSPITAL_COMMUNITY): Payer: Self-pay | Admitting: Adult Health

## 2020-09-29 ENCOUNTER — Ambulatory Visit (INDEPENDENT_AMBULATORY_CARE_PROVIDER_SITE_OTHER): Payer: 59 | Admitting: Family

## 2020-09-29 ENCOUNTER — Encounter: Payer: Self-pay | Admitting: Family

## 2020-09-29 DIAGNOSIS — L98499 Non-pressure chronic ulcer of skin of other sites with unspecified severity: Secondary | ICD-10-CM | POA: Diagnosis not present

## 2020-09-29 DIAGNOSIS — L8990 Pressure ulcer of unspecified site, unspecified stage: Secondary | ICD-10-CM

## 2020-10-01 ENCOUNTER — Telehealth (HOSPITAL_COMMUNITY): Payer: Self-pay | Admitting: Pharmacist

## 2020-10-01 ENCOUNTER — Other Ambulatory Visit (HOSPITAL_COMMUNITY): Payer: Self-pay | Admitting: Cardiology

## 2020-10-01 MED ORDER — ROSUVASTATIN CALCIUM 5 MG PO TABS
5.0000 mg | ORAL_TABLET | Freq: Every day | ORAL | 11 refills | Status: DC
Start: 2020-10-01 — End: 2020-10-01

## 2020-10-01 NOTE — Telephone Encounter (Signed)
Fill for rosuvastatin sent to [facility].

## 2020-10-05 ENCOUNTER — Encounter (HOSPITAL_COMMUNITY): Payer: Self-pay | Admitting: Cardiology

## 2020-10-05 ENCOUNTER — Ambulatory Visit (HOSPITAL_COMMUNITY)
Admission: RE | Admit: 2020-10-05 | Discharge: 2020-10-05 | Disposition: A | Discharge status: Home / Self Care | Payer: 59 | Source: Ambulatory Visit | Attending: Cardiology | Admitting: Cardiology

## 2020-10-05 ENCOUNTER — Other Ambulatory Visit (HOSPITAL_COMMUNITY): Payer: Self-pay | Admitting: Cardiology

## 2020-10-05 ENCOUNTER — Other Ambulatory Visit: Payer: Self-pay

## 2020-10-05 VITALS — BP 128/78 | HR 58 | Wt 158.6 lb

## 2020-10-05 DIAGNOSIS — Z8673 Personal history of transient ischemic attack (TIA), and cerebral infarction without residual deficits: Secondary | ICD-10-CM | POA: Diagnosis not present

## 2020-10-05 DIAGNOSIS — F1011 Alcohol abuse, in remission: Secondary | ICD-10-CM | POA: Diagnosis not present

## 2020-10-05 DIAGNOSIS — I513 Intracardiac thrombosis, not elsewhere classified: Secondary | ICD-10-CM | POA: Diagnosis not present

## 2020-10-05 DIAGNOSIS — Z86718 Personal history of other venous thrombosis and embolism: Secondary | ICD-10-CM | POA: Diagnosis not present

## 2020-10-05 DIAGNOSIS — I2510 Atherosclerotic heart disease of native coronary artery without angina pectoris: Secondary | ICD-10-CM | POA: Diagnosis not present

## 2020-10-05 DIAGNOSIS — I5022 Chronic systolic (congestive) heart failure: Secondary | ICD-10-CM | POA: Diagnosis present

## 2020-10-05 DIAGNOSIS — Z87891 Personal history of nicotine dependence: Secondary | ICD-10-CM | POA: Diagnosis not present

## 2020-10-05 DIAGNOSIS — Z8249 Family history of ischemic heart disease and other diseases of the circulatory system: Secondary | ICD-10-CM | POA: Insufficient documentation

## 2020-10-05 DIAGNOSIS — I240 Acute coronary thrombosis not resulting in myocardial infarction: Secondary | ICD-10-CM | POA: Diagnosis not present

## 2020-10-05 DIAGNOSIS — Z79899 Other long term (current) drug therapy: Secondary | ICD-10-CM | POA: Insufficient documentation

## 2020-10-05 DIAGNOSIS — Z7901 Long term (current) use of anticoagulants: Secondary | ICD-10-CM | POA: Diagnosis not present

## 2020-10-05 DIAGNOSIS — I739 Peripheral vascular disease, unspecified: Secondary | ICD-10-CM | POA: Diagnosis not present

## 2020-10-05 DIAGNOSIS — Z8711 Personal history of peptic ulcer disease: Secondary | ICD-10-CM | POA: Diagnosis not present

## 2020-10-05 DIAGNOSIS — E785 Hyperlipidemia, unspecified: Secondary | ICD-10-CM | POA: Diagnosis not present

## 2020-10-05 LAB — COMPREHENSIVE METABOLIC PANEL
ALT: 14 U/L (ref 0–44)
AST: 18 U/L (ref 15–41)
Albumin: 3.7 g/dL (ref 3.5–5.0)
Alkaline Phosphatase: 133 U/L — ABNORMAL HIGH (ref 38–126)
Anion gap: 7 (ref 5–15)
BUN: 30 mg/dL — ABNORMAL HIGH (ref 6–20)
CO2: 23 mmol/L (ref 22–32)
Calcium: 9.3 mg/dL (ref 8.9–10.3)
Chloride: 106 mmol/L (ref 98–111)
Creatinine, Ser: 1.14 mg/dL — ABNORMAL HIGH (ref 0.44–1.00)
GFR, Estimated: 57 mL/min — ABNORMAL LOW (ref >60)
Glucose, Bld: 98 mg/dL (ref 70–99)
Potassium: 5.1 mmol/L (ref 3.5–5.1)
Sodium: 136 mmol/L (ref 135–145)
Total Bilirubin: 0.7 mg/dL (ref 0.3–1.2)
Total Protein: 8 g/dL (ref 6.5–8.1)

## 2020-10-05 LAB — LIPID PANEL
Cholesterol: 123 mg/dL (ref 0–200)
HDL: 30 mg/dL — ABNORMAL LOW (ref >40)
LDL Cholesterol: 74 mg/dL (ref 0–99)
Total CHOL/HDL Ratio: 4.1 RATIO
Triglycerides: 93 mg/dL (ref <150)
VLDL: 19 mg/dL (ref 0–40)

## 2020-10-05 MED ORDER — ENTRESTO 49-51 MG PO TABS: 1.0000 | ORAL_TABLET | Freq: Two times a day (BID) | ORAL | 3 refills | Status: DC

## 2020-10-05 NOTE — Patient Instructions (Signed)
Increase Entresto to 49/51 mg Twice daily   Labs done today, your results will be available in MyChart, we will contact you for abnormal readings.  Your physician recommends that you return for lab work in: 2 weeks  Your physician recommends that you schedule a follow-up appointment in: 3 months with an echocardiogram  If you have any questions or concerns before your next appointment please send [place] a message through [place] or call our office at [phone].    TO LEAVE A MESSAGE FOR THE NURSE SELECT OPTION 2, PLEASE LEAVE A MESSAGE INCLUDING: . YOUR NAME . DATE OF BIRTH . CALL BACK NUMBER . REASON FOR CALL**this is important as we prioritize the call backs  YOU WILL RECEIVE A CALL BACK THE SAME DAY AS LONG AS YOU CALL BEFORE 4:00 PM  At the [facility], you and your health needs are our priority. As part of our continuing mission to provide you with exceptional heart care, we have created designated Provider Care Teams. These Care Teams include your primary Cardiologist (physician) and Advanced Practice Providers (APPs- Physician Assistants and Nurse Practitioners) who all work together to provide you with the care you need, when you need it.   You may see any of the following providers on your designated Care Team at your next follow up: Marland Kitchen Dr Arvilla Meres . Dr Marca Ancona . Tonye Becket, NP . Robbie Lis, PA . Karle Plumber, PharmD   Please be sure to bring in all your medications bottles to every appointment.

## 2020-10-05 NOTE — Progress Notes (Signed)
PCP: None  Cardiologist: Dr Shirlee Latch Vascular : Dr Darrick Penna  Ortho: Dr Lajoyce Corners  [facility]   HPI: Mrs Hokenson is a 54 y.o. alcoholic woman with a history of R frontal CVA, B popliteal artery occlusions, hepatic failure, GI bleeding, cirrhosis, ETOH abuse, and chronic systolic heart failure.  Admitted 08/15/19 with increased sob and confusion. Treated for PNA and MRI brain showed R ACA stroke. Hospital course complicated new onset acute systolic heart failure possibly due to ETOH abuse. Hospital course complicated by LV thrombus, hepatitis, ARF, lower extremity cardioembolism, and shock.  She had suspected cardioembolism to bilateral popliteal arteries and underwent mechanical thrombectomy.  Received PRBCs and was able to undergo EGD 9/29 that showed duodenal and gastric ulcers which were treated endoscopically. Continued on PPI. Heparin has been converted to coumadin. R foot wound was to be followed by Vascular in the community  Discharged to SNF. On 09/21/19 she presented to the [facility] after a fall that resulted in head laceration. CT scan was unremarkable. She left SNF on 09/27/19 because she was not taken to any of her follow up appointments.    She has had follow up with VVS/Ortho for R foot wound and had debridement 10/14/19.   Echo in 3/21 showed EF increased to 45-50%, mild LV dilation, normal RV.   Today she returns for followup of CHF.  Right foot ulcer has almost healed.  She is in NSR today.  No palpitations.  Weight is stable.  Very rare ETOH use.  No significant exertional dyspnea.  No chest pain.  No orthopnea/PND.    ECG (personally reviewed): NSR, normal  Labs (11/20): K 4.2, creatinine 0.97 Labs (12/20): LDL 55, TGs 259 Labs (3/21): K 4.3, 0.99 Labs (5/21): LDL 71, HDL 38, TGs 134 Labs (6/21): K 4.8, creatinine 1.05  1. Chronic systolic CHF: Primarily nonischemic cardiomyopathy.   - Echo (9/20): EF 15-20% with LV apical thrombus. Severely decreased RV function.  - RHC  (10/20): RA 6, CI 3.7, PVR 3.7  - LHC (10/20): LAD 40%, Left Circumflex 50% , 80% proximal RCA, 60% mid PDA stenosis - Echo (3/21): EF increased to 45-50%, mild LV dilation, normal RV.  2. GI Bleed: Gastric and duodenal ulcers in 9/20.  3. R frontal CVA: In 9/20, due to LV thrombus.  4. 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   - ABIs (11/20): Normal.  5. 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  Liver imaging did not show cirrhosis or portal vein thrombosis. RV failure/congestive hepatopathy may have played a role.  6. ETOH Abuse  7. LV thrombus 8. CAD: LHC 10/20 LAD 40%, Left Circumflex 50% , RCA 80% Proximal RCA 60% mid PDA stenosis.  9. DVT: Right popliteal vein in 9/20.   ROS: All systems negative except as listed in HPI, PMH and Problem List.  SH:  Social History   Socioeconomic History  . Marital status: Married    Spouse name: Not on file  . Number of children: Not on file  . Years of education: Not on file  . Highest education level: Not on file  Occupational History  . Not on file  Tobacco Use  . Smoking status: Former Smoker    Years: 39.00    Quit date: 06/2019    Years since quitting: 1.2  . Smokeless tobacco: Never Used  Vaping Use  . Vaping Use: Never used  Substance and Sexual Activity  . Alcohol use: Not Currently    Comment: alcoholism - last  drink in august 2020  . Drug use: Not Currently    Types: Marijuana    Comment: none since August 2020  . Sexual activity: Not on file  Other Topics Concern  . Not on file  Social History Narrative  . Not on file   Social Determinants of Health   Financial Resource Strain:   . Difficulty of Paying Living Expenses: Not on file  Food Insecurity:   . Worried About Programme researcher, broadcasting/film/video in the Last Year: Not on file  . Ran Out of Food in the Last Year: Not on file  Transportation Needs:   . Lack of Transportation (Medical): Not on file  . Lack of Transportation  (Non-Medical): Not on file  Physical Activity:   . Days of Exercise per Week: Not on file  . Minutes of Exercise per Session: Not on file  Stress:   . Feeling of Stress : Not on file  Social Connections:   . Frequency of Communication with Friends and Family: Not on file  . Frequency of Social Gatherings with Friends and Family: Not on file  . Attends Religious Services: Not on file  . Active Member of Clubs or Organizations: Not on file  . Attends Banker Meetings: Not on file  . Marital Status: Not on file  Intimate Partner Violence:   . Fear of Current or Ex-Partner: Not on file  . Emotionally Abused: Not on file  . Physically Abused: Not on file  . Sexually Abused: Not on file    FH:  Family History  Problem Relation Age of Onset  . Heart disease Mother     Current Outpatient Medications  Medication Sig Dispense Refill  . carvedilol (COREG) 6.25 MG tablet Take 1 tablet (6.25 mg total) by mouth 2 (two) times daily with a meal. 60 tablet 11  . folic acid (FOLVITE) 1 MG tablet Take 1 tablet (1 mg total) by mouth daily. 90 tablet 3  . furosemide (LASIX) 20 MG tablet Take 1 tablet (20 mg total) by mouth daily as needed (fluid retention.). 30 tablet 11  . ibuprofen (ADVIL) 200 MG tablet Take 200 mg by mouth every 6 (six) hours as needed for headache or moderate pain.    Marland Kitchen icosapent Ethyl (VASCEPA) 1 g capsule Take 2 capsules (2 g total) by mouth 2 (two) times daily. 120 capsule 5  . pantoprazole (PROTONIX) 40 MG tablet Take 1 tablet (40 mg total) by mouth daily. 90 tablet 3  . rosuvastatin (CRESTOR) 5 MG tablet Take 1 tablet (5 mg total) by mouth daily. 30 tablet 11  . spironolactone (ALDACTONE) 25 MG tablet Take 1 tablet (25 mg total) by mouth daily. 90 tablet 3  . warfarin (COUMADIN) 5 MG tablet Take 1 to 1.5 tablets daily as directed by the anti-coag clinic. 120 tablet 0  . sacubitril-valsartan (ENTRESTO) 49-51 MG Take 1 tablet by mouth 2 (two) times daily. 60  tablet 3   No current facility-administered medications for this encounter.    Vitals:   10/05/20 1140  BP: 128/78  Pulse: (!) 58  SpO2: 98%  Weight: 71.9 kg (158 lb 9.6 oz)   Wt Readings from Last 3 Encounters:  10/05/20 71.9 kg (158 lb 9.6 oz)  06/24/20 72.1 kg (159 lb)  05/27/20 72.1 kg (159 lb)    PHYSICAL EXAM: General: NAD Neck: No JVD, no thyromegaly or thyroid nodule.  Lungs: Clear to auscultation bilaterally with normal respiratory effort. CV: Nondisplaced PMI.  Heart regular  S1/S2, no S3/S4, no murmur.  No peripheral edema.  No carotid bruit.  Normal pedal pulses.  Abdomen: Soft, nontender, no hepatosplenomegaly, no distention.  Skin: Intact without lesions or rashes.  Neurologic: Alert and oriented x 3.  Psych: Normal affect. Extremities: No clubbing or cyanosis.  HEENT: Normal.   ASSESSMENT & PLAN: 1. Chronic systolic CHF:  Cardiogenic shock in 9/20, echo 07/2019 with EF 15-20%, mild LV dilation, LV thrombus, mild-moderate MR, severely decreased RV systolic function.  LHC/RHC done 10/20 showing normal filling pressures and preserved cardiac output.  There was diffuse coronary disease, especially involving distal and branch vessels, only possible interventional target was 80% proximal RCA stenosis.  No intervention given good flow down the vessel and unlikely to significantly improve LV function.  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 Echo in 3/21 showed EF up to 45-50%.  NYHA class 2, not volume overloaded on exam.  - Continue Coreg 6.25 mg bid.   - Increase Entresto to 49/51 bid, BMET today and in 10 days. - Continue spironolactone 25 mg daily. BMET today.  - Continue to abstain from ETOH.  - Repeat echo at followup in 3 months.  2. Suspected ETOH hepatitis: Elevated LFTs during 9/20 admission.  However liver imaging with [place] and CT has NOT shown cirrhosis or portal/hepatic vein thrombosis.  Question has arisen whether this might possibly be due primarily to RV failure.  3.  LV thrombus: Possible cause of embolic event to popliteal arteries as well as subacute CVA.  - Continue warfarin.  If EF continues to rise, can likely stop in future.  4. PAD: Bilateral popliteal artery occlusions, suspect cardioembolic from LV thrombus. Now s/p mechanical thrombectomy by vascular.  Right foot wound followed by Dr. Lajoyce Corners, has had skin graft and gradually healing.   5. CVA: Subacute right ACA CVA in 9/20. Likely from LV thrombus.  - Continue anticoagulation.  6. Right popliteal vein DVT: 9/20.  - Continue warfarin.   7. H/O GI bleeding: Upper GI bleeding, EGD with bleeding duodenal and gastric ulcer in 9/20 that were treated, no varices.  - Continue Protonix 8. H/O ETOH abuse: She has quit drinking now since August 2020.  This may be helping LV function.   9. CAD: Extensive distal and branch vessel coronary disease on cath 10/20.  Only possible interventional target was 80% proximal RCA stenosis.  However, there was good flow down the RCA and I do not think intervention on this lesion would appreciably improve her LV function.  No chest pain.  - Continue Crestor 5 mg daily. Check lipids today.   - Triglycerides controlled on Vascepa  - No ASA given warfarin use.   Followup in 3 months with echo.   Marca Ancona 10/05/2020

## 2020-10-06 NOTE — Progress Notes (Signed)
Office Visit Note   Patient: Briana Andrews           Date of Birth: 1966/03/10           MRN: [id number] Visit Date: 09/29/2020              Requested by: No referring provider defined for this encounter. PCP: Patient, [name]  No chief complaint on file.     HPI: The patient is a 54 year old woman seen today in follow up for wound to right foot. Is status post skin grafting nearly a year a go. She has been using a medical compression sock. Has no concerns.  Assessment & Plan: Visit Diagnoses: No diagnosis found.  Plan: She will follow up in a month  Follow-Up Instructions: Return in about 4 weeks (around 10/27/2020).   Ortho Exam  Patient is alert, oriented, no adenopathy, well-dressed, normal affect, normal respiratory effort. On examination of right foot has excellent circumferential epithelialization. No drainage no foul odor healthy fibrous tissue.  No cellulitis  Imaging: No results found. No images are attached to the encounter.  Labs: Lab Results  Component Value Date   HGBA1C 5.8 (H) 08/17/2019   ESRSEDRATE 0 08/16/2019   REPTSTATUS 08/19/2019 FINAL 08/18/2019   CULT  08/18/2019    NO GROWTH Performed at [facility], [address], [place], [place] [address]      Lab Results  Component Value Date   ALBUMIN 3.7 10/05/2020   ALBUMIN 3.1 (L) 11/25/2019   ALBUMIN 2.6 (L) 10/15/2019    Lab Results  Component Value Date   MG 2.1 09/08/2019   MG 1.4 (L) 09/07/2019   MG 1.9 09/02/2019   No results found for: VD25OH  No results found for: PREALBUMIN CBC EXTENDED Latest Ref Rng & Units 11/25/2019 10/15/2019 09/29/2019  WBC 4.0 - 10.5 K/uL 8.7 7.4 8.3  RBC 3.87 - 5.11 MIL/uL 3.62(L) 3.38(L) 3.21(L)  HGB 12.0 - 15.0 g/dL 11.2(L) 10.6(L) 10.2(L)  HCT 36 - 46 % 34.9(L) 32.8(L) 31.4(L)  PLT 150 - 400 K/uL 449(H) 535(H) 680(H)  NEUTROABS 1.7 - 7.7 K/uL - - -  LYMPHSABS 0.7 - 4.0 K/uL - - -     There is no height or weight on file to  calculate BMI.  Orders:  No orders of the defined types were placed in this encounter.  No orders of the defined types were placed in this encounter.    Procedures: No procedures performed  Clinical Data: No additional findings.  ROS:  All other systems negative, except as noted in the HPI. Review of Systems  Constitutional: Negative for chills and fever.  Cardiovascular: Negative for leg swelling.  Skin: Positive for wound. Negative for color change.    Objective: Vital Signs: There were no vitals taken for this visit.  Specialty Comments:  No specialty comments available.  PMFS History: Patient Active Problem List   Diagnosis Date Noted  . Gangrene of right foot (HCC)   . Cutaneous abscess of right foot   . Endotracheally intubated   . Acute on chronic systolic CHF (congestive heart failure) (HCC)   . GI bleed 08/26/2019  . Pressure injury of skin 08/23/2019  . Acute upper GI bleed   . Shock circulatory (HCC)   . Palliative care by specialist   . Goals of care, counseling/discussion   . Advanced care planning/counseling discussion   . Jaundice   . Cerebral embolism with cerebral infarction 08/17/2019  . PVD (  peripheral vascular disease) (HCC) 08/17/2019  . 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 Acute CHF (congestive heart failure) (HCC) 08/17/2019  . Abnormal LFTs 08/16/2019  . Acute metabolic encephalopathy 08/16/2019  . SOB (shortness of breath) 08/16/2019  . AKI (acute kidney injury) (HCC) 08/16/2019  . Fall 08/16/2019  . Lactic acidosis 08/16/2019  . Back pain 08/16/2019  . Liver failure without hepatic coma (HCC) 08/16/2019  . Multifocal pneumonia 08/16/2019  . Alcohol abuse   . Tobacco abuse   . Shortness of breath    Past Medical History:  Diagnosis Date  . Alcohol abuse   . CHF (congestive heart failure) (HCC)    last EF 35-40%. etoh induced  . Coronary artery disease    80%RCA, 50%LCX, 40%LAD  . Depression   .  DVT (deep venous thrombosis) (HCC)    right popliteal vein (08/17/19)  . GERD (gastroesophageal reflux disease)   . Hyperlipidemia   . LV (left ventricular) mural thrombus   . Stroke [facility])    / memory loss  . Tobacco abuse     Family History  Problem Relation Age of Onset  . Heart disease Mother     Past Surgical History:  Procedure Laterality Date  . ABDOMINAL AORTOGRAM W/LOWER EXTREMITY N/A 08/19/2019   Procedure: ABDOMINAL AORTOGRAM W/LOWER EXTREMITY;  Surgeon: Nada Libman, MD;  [facility] INV[facility] LAB;  Service: Cardiovascular;  Laterality: N/A;  . ABDOMINAL AORTOGRAM W/LOWER EXTREMITY Bilateral 08/20/2019   Procedure: ABDOMINAL AORTOGRAM W/LOWER EXTREMITY;  Surgeon: Cephus Shelling, MD;  Location: [facility] CV LAB;  Service: Cardiovascular;  Laterality: Bilateral;  . DEBRIDEMENT  FOOT Right 10/15/2019  . ESOPHAGOGASTRODUODENOSCOPY N/A 08/26/2019   Procedure: ESOPHAGOGASTRODUODENOSCOPY (EGD);  Surgeon: Charlott Rakes, MD;  Location: [facility] ENDOSCOPY;  Service: Endoscopy;  Laterality: N/A;  . HOT HEMOSTASIS N/A 08/26/2019   Procedure: HOT HEMOSTASIS (ARGON PLASMA COAGULATION/BICAP);  Surgeon: Charlott Rakes, MD;  Location: [facility] ENDOSCOPY;  Service: Endoscopy;  Laterality: N/A;  . I & D EXTREMITY Right 10/15/2019   Procedure: RIGHT FOOT DEBRIDEMENT;  Surgeon: Nadara Mustard, MD;  Location: [facility] OR;  Service: Orthopedics;  Laterality: Right;  . PERIPHERAL VASCULAR BALLOON ANGIOPLASTY  08/19/2019   Procedure: PERIPHERAL VASCULAR BALLOON ANGIOPLASTY;  Surgeon: Nada Libman, MD;  [facility];  Service: Cardiovascular;;  Right femoral popliteal and PT   . PERIPHERAL VASCULAR INTERVENTION Left 08/20/2019   Procedure: PERIPHERAL VASCULAR INTERVENTION;  Surgeon: Cephus Shelling, MD;  Location: [facility] INVASIVE CV LAB;  Service: Cardiovascular;  Laterality: Left;  . RIGHT/LEFT HEART CATH AND CORONARY ANGIOGRAPHY N/A 09/02/2019   Procedure: RIGHT/LEFT HEART CATH AND  CORONARY ANGIOGRAPHY;  Surgeon: Laurey Morale, MD;  Location: [facility] INVASIVE CV LAB;  Service: Cardiovascular;  Laterality: N/A;  . SCLEROTHERAPY  08/26/2019   Procedure: SCLEROTHERAPY;  Surgeon: Charlott Rakes, MD;  Location: [facility] ENDOSCOPY;  Service: Endoscopy;;   Social History   Occupational History  . Not on file  Tobacco Use  . Smoking status: Former Smoker    Years: 39.00    Quit date: 06/2019    Years since quitting: 1.2  . Smokeless tobacco: Never Used  Vaping Use  . Vaping Use: Never used  Substance and Sexual Activity  . Alcohol use: Not Currently    Comment: alcoholism - last drink in august 2020  . Drug use: Not Currently    Types: Marijuana    Comment: none since August 2020  . Sexual activity: Not on file

## 2020-10-14 ENCOUNTER — Telehealth (HOSPITAL_COMMUNITY): Payer: Self-pay | Admitting: Pharmacy Technician

## 2020-10-14 NOTE — Telephone Encounter (Signed)
Patient called in stating that her insurance was not covering the increase in her Entresto (49-51mg ), per her pharmacy.   There was not a way to send the request through CoverMyMeds. Called [phone], the representative is going to fax the request over.  Will follow up.

## 2020-10-15 MED FILL — ENTRESTO 49 MG-51 MG TABLET: 49-51 | 30 days supply | Qty: 60 | Fill #0

## 2020-10-15 NOTE — Telephone Encounter (Signed)
The PA that was supposed to come in via fax has not come. Called and spoke with a representative and they updated the existing PA to reflect her new dosage. Paid claim of $208.Called patient, advised her that she could get that filled at [facility] now. Advised her to call me with any issues.  Patient called back, she has had several issues getting prescriptions to be filled correctly at the [facility] in [place]. Called [facility] outpatient pharmacy, they will get her profile transferred over, including her [facility] co-pay card.  Sherryll Burger will be mailed to her and all other prescriptions will be placed on hold until the patient calls to set up a mail order refill. Provided her with their phone number. Advised patient to call [facility] and provide card information for mail delivery.  Archer Asa, CPhT

## 2020-10-20 ENCOUNTER — Other Ambulatory Visit (HOSPITAL_COMMUNITY): Payer: 59

## 2020-10-25 MED FILL — PANTOPRAZOLE SOD DR 40 MG T: 40 | 90 days supply | Qty: 90 | Fill #0

## 2020-11-03 ENCOUNTER — Ambulatory Visit (INDEPENDENT_AMBULATORY_CARE_PROVIDER_SITE_OTHER): Payer: 59

## 2020-11-03 ENCOUNTER — Other Ambulatory Visit: Payer: Self-pay

## 2020-11-03 DIAGNOSIS — I240 Acute coronary thrombosis not resulting in myocardial infarction: Secondary | ICD-10-CM

## 2020-11-03 DIAGNOSIS — Z5181 Encounter for therapeutic drug level monitoring: Secondary | ICD-10-CM

## 2020-11-03 LAB — POCT INR: INR: 2.6 (ref 2.0–3.0)

## 2020-11-03 MED FILL — CARVEDILOL 6.25 MG TABLET: 6.25 | 90 days supply | Qty: 180 | Fill #0

## 2020-11-03 NOTE — Patient Instructions (Signed)
-   continue dosage of warfarin 5 mg every day - Recheck INR in 6 weeks

## 2020-11-05 ENCOUNTER — Other Ambulatory Visit: Payer: Self-pay

## 2020-11-05 ENCOUNTER — Encounter (HOSPITAL_COMMUNITY): Payer: Self-pay

## 2020-11-05 ENCOUNTER — Ambulatory Visit (HOSPITAL_COMMUNITY)
Admission: RE | Admit: 2020-11-05 | Discharge: 2020-11-05 | Disposition: A | Discharge status: Home / Self Care | Payer: 59 | Source: Ambulatory Visit | Attending: Internal Medicine | Admitting: Internal Medicine

## 2020-11-05 DIAGNOSIS — I5022 Chronic systolic (congestive) heart failure: Secondary | ICD-10-CM | POA: Insufficient documentation

## 2020-11-05 LAB — BASIC METABOLIC PANEL
Anion gap: 8 (ref 5–15)
BUN: 18 mg/dL (ref 6–20)
CO2: 26 mmol/L (ref 22–32)
Calcium: 9.3 mg/dL (ref 8.9–10.3)
Chloride: 104 mmol/L (ref 98–111)
Creatinine, Ser: 1.13 mg/dL — ABNORMAL HIGH (ref 0.44–1.00)
GFR, Estimated: 57 mL/min — ABNORMAL LOW (ref >60)
Glucose, Bld: 84 mg/dL (ref 70–99)
Potassium: 4.8 mmol/L (ref 3.5–5.1)
Sodium: 138 mmol/L (ref 135–145)

## 2020-11-10 ENCOUNTER — Encounter: Payer: Self-pay | Admitting: Physician Assistant

## 2020-11-10 ENCOUNTER — Ambulatory Visit (INDEPENDENT_AMBULATORY_CARE_PROVIDER_SITE_OTHER): Payer: 59 | Admitting: Physician Assistant

## 2020-11-10 DIAGNOSIS — L02611 Cutaneous abscess of right foot: Secondary | ICD-10-CM

## 2020-11-10 NOTE — Progress Notes (Signed)
Office Visit Note   Patient: Briana Andrews           Date of Birth: 1966-05-05           MRN: [id number] Visit Date: 11/10/2020              Requested by: No referring provider defined for this encounter. PCP: Patient, [name]  No chief complaint on file.     HPI: Patient follows up today for follow-up on her right foot ulcer she is doing well without complaints.  She is wearing her vive compression stocking  Assessment & Plan: Visit Diagnoses: No diagnosis found.  Plan: Follow-up 2 months  Follow-Up Instructions: No follow-ups on file.   Ortho Exam  Patient is alert, oriented, no adenopathy, well-dressed, normal affect, normal respiratory effort. She continues to have excellent epithelialization.  Now measures 5 cm x 3 cm.  Healthy vascular tissue.  No cellulitis no evidence of infection no swelling  Imaging: No results found. No images are attached to the encounter.  Labs: Lab Results  Component Value Date   HGBA1C 5.8 (H) 08/17/2019   ESRSEDRATE 0 08/16/2019   REPTSTATUS 08/19/2019 FINAL 08/18/2019   CULT  08/18/2019    NO GROWTH Performed at [facility], [address] [place], [place] [address]      Lab Results  Component Value Date   ALBUMIN 3.7 10/05/2020   ALBUMIN 3.1 (L) 11/25/2019   ALBUMIN 2.6 (L) 10/15/2019    Lab Results  Component Value Date   MG 2.1 09/08/2019   MG 1.4 (L) 09/07/2019   MG 1.9 09/02/2019   No results found for: VD25OH  No results found for: PREALBUMIN CBC EXTENDED Latest Ref Rng & Units 11/25/2019 10/15/2019 09/29/2019  WBC 4.0 - 10.5 K/uL 8.7 7.4 8.3  RBC 3.87 - 5.11 MIL/uL 3.62(L) 3.38(L) 3.21(L)  HGB 12.0 - 15.0 g/dL 11.2(L) 10.6(L) 10.2(L)  HCT 36.0 - 46.0 % 34.9(L) 32.8(L) 31.4(L)  PLT 150 - 400 K/uL 449(H) 535(H) 680(H)  NEUTROABS 1.7 - 7.7 K/uL - - -  LYMPHSABS 0.7 - 4.0 K/uL - - -     There is no height or weight on file to calculate BMI.  Orders:  No orders of the defined types were  placed in this encounter.  No orders of the defined types were placed in this encounter.    Procedures: No procedures performed  Clinical Data: No additional findings.  ROS:  All other systems negative, except as noted in the HPI. Review of Systems  Objective: Vital Signs: There were no vitals taken for this visit.  Specialty Comments:  No specialty comments available.  PMFS History: Patient Active Problem List   Diagnosis Date Noted  . Gangrene of right foot (HCC)   . Cutaneous abscess of right foot   . Endotracheally intubated   . Acute on chronic systolic CHF (congestive heart failure) (HCC)   . GI bleed 08/26/2019  . Pressure injury of skin 08/23/2019  . Acute upper GI bleed   . Shock circulatory (HCC)   . Palliative care by specialist   . Goals of care, counseling/discussion   . Advanced care planning/counseling discussion   . Jaundice   . Cerebral embolism with cerebral infarction 08/17/2019  . PVD (peripheral vascular disease) (HCC) 08/17/2019  . 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 Acute CHF (congestive heart failure) (HCC) 08/17/2019  . Abnormal LFTs 08/16/2019  . Acute metabolic encephalopathy 08/16/2019  . SOB (  shortness of breath) 08/16/2019  . AKI (acute kidney injury) (HCC) 08/16/2019  . Fall 08/16/2019  . Lactic acidosis 08/16/2019  . Back pain 08/16/2019  . Liver failure without hepatic coma (HCC) 08/16/2019  . Multifocal pneumonia 08/16/2019  . Alcohol abuse   . Tobacco abuse   . Shortness of breath    Past Medical History:  Diagnosis Date  . Alcohol abuse   . CHF (congestive heart failure) (HCC)    last EF 35-40%. etoh induced  . Coronary artery disease    80%RCA, 50%LCX, 40%LAD  . Depression   . DVT (deep venous thrombosis) (HCC)    right popliteal vein (08/17/19)  . GERD (gastroesophageal reflux disease)   . Hyperlipidemia   . LV (left ventricular) mural thrombus   . Stroke [facility])    / memory loss  .  Tobacco abuse     Family History  Problem Relation Age of Onset  . Heart disease Mother     Past Surgical History:  Procedure Laterality Date  . ABDOMINAL AORTOGRAM W/LOWER EXTREMITY N/A 08/19/2019   Procedure: ABDOMINAL AORTOGRAM W/LOWER EXTREMITY;  Surgeon: Nada Libman, MD;  [facility] LAB;  Service: Cardiovascular;  Laterality: N/A;  . ABDOMINAL AORTOGRAM W/LOWER EXTREMITY Bilateral 08/20/2019   Procedure: ABDOMINAL AORTOGRAM W/LOWER EXTREMITY;  Surgeon: Cephus Shelling, MD;  [facility];  Service: Cardiovascular;  Laterality: Bilateral;  . DEBRIDEMENT  FOOT Right 10/15/2019  . ESOPHAGOGASTRODUODENOSCOPY N/A 08/26/2019   Procedure: ESOPHAGOGASTRODUODENOSCOPY (EGD);  Surgeon: Charlott Rakes, MD;  Location: [facility] ENDOSCOPY;  Service: Endoscopy;  Laterality: N/A;  . HOT HEMOSTASIS N/A 08/26/2019   Procedure: HOT HEMOSTASIS (ARGON PLASMA COAGULATION/BICAP);  Surgeon: Charlott Rakes, MD;  Location: [facility] ENDOSCOPY;  Service: Endoscopy;  Laterality: N/A;  . I & D EXTREMITY Right 10/15/2019   Procedure: RIGHT FOOT DEBRIDEMENT;  Surgeon: Nadara Mustard, MD;  Location: [facility] OR;  Service: Orthopedics;  Laterality: Right;  . PERIPHERAL VASCULAR BALLOON ANGIOPLASTY  08/19/2019   Procedure: PERIPHERAL VASCULAR BALLOON ANGIOPLASTY;  Surgeon: Nada Libman, MD;  [facility];  Service: Cardiovascular;;  Right femoral popliteal and PT   . PERIPHERAL VASCULAR INTERVENTION Left 08/20/2019   Procedure: PERIPHERAL VASCULAR INTERVENTION;  Surgeon: Cephus Shelling, MD;  Location: [facility] INVASIVE CV LAB;  Service: Cardiovascular;  Laterality: Left;  . RIGHT/LEFT HEART CATH AND CORONARY ANGIOGRAPHY N/A 09/02/2019   Procedure: RIGHT/LEFT HEART CATH AND CORONARY ANGIOGRAPHY;  Surgeon: Laurey Morale, MD;  Location: [facility] INVASIVE CV LAB;  Service: Cardiovascular;  Laterality: N/A;  . SCLEROTHERAPY  08/26/2019   Procedure: SCLEROTHERAPY;  Surgeon: Charlott Rakes, MD;  Location: [facility] ENDOSCOPY;  Service: Endoscopy;;   Social History   Occupational History  . Not on file  Tobacco Use  . Smoking status: Former Smoker    Years: 39.00    Quit date: 06/2019    Years since quitting: 1.3  . Smokeless tobacco: Never Used  Vaping Use  . Vaping Use: Never used  Substance and Sexual Activity  . Alcohol use: Not Currently    Comment: alcoholism - last drink in august 2020  . Drug use: Not Currently    Types: Marijuana    Comment: none since August 2020  . Sexual activity: Not on file

## 2020-11-11 MED FILL — ENTRESTO 49 MG-51 MG TABLET: 49-51 | 30 days supply | Qty: 60 | Fill #1

## 2020-11-22 MED FILL — ENTRESTO 49 MG-51 MG TABLET: 49-51 | 30 days supply | Qty: 60 | Fill #1

## 2020-11-29 ENCOUNTER — Telehealth: Payer: Self-pay | Admitting: Cardiology

## 2020-11-29 ENCOUNTER — Other Ambulatory Visit: Payer: Self-pay | Admitting: Cardiology

## 2020-11-29 MED ORDER — WARFARIN SODIUM 5 MG PO TABS: ORAL_TABLET | ORAL | 0 refills | Status: DC

## 2020-11-29 MED FILL — WARFARIN SODIUM 5 MG TABLET: 5 | 90 days supply | Qty: 90 | Fill #0

## 2020-11-29 NOTE — Telephone Encounter (Signed)
*  STAT* If patient is at the pharmacy, call can be transferred to refill team.   1. Which medications need to be refilled? (please list name of each medication and dose if known) warfarin (COUMADIN) 5 MG   2. Which pharmacy/location (including street and city if local pharmacy) is medication to be sent to? Patient unaware of name - says it is the service that goes through [facility] and they deliver it to her house   3. Do they need a 30 day or 90 day supply? 30 day

## 2020-11-29 NOTE — Telephone Encounter (Signed)
All prior rx have been sent to [facility]. Called pt, she wants rx sent to [facility] outpatient pharmacy. Rx sent in, provided pt with # to call so that she can coordinate delivery of med.

## 2020-12-13 MED FILL — FOLIC ACID 1 MG TABS: 1 | 90 days supply | Qty: 90 | Fill #0

## 2020-12-14 ENCOUNTER — Telehealth (HOSPITAL_COMMUNITY): Payer: Self-pay | Admitting: Pharmacist

## 2020-12-14 ENCOUNTER — Other Ambulatory Visit (HOSPITAL_COMMUNITY): Payer: Self-pay | Admitting: Cardiology

## 2020-12-14 MED ORDER — SPIRONOLACTONE 25 MG PO TABS: 25.0000 mg | ORAL_TABLET | Freq: Every day | ORAL | 1 refills | Status: DC

## 2020-12-14 MED FILL — SPIRONOLACTONE 25 MG TABS: 25 | 90 days supply | Qty: 90 | Fill #0

## 2020-12-14 NOTE — Telephone Encounter (Signed)
Spironolactone refill sent to [facility].  Karle Plumber, PharmD, BCPS, BCCP, CPP [facility] Pharmacist [phone]

## 2020-12-15 ENCOUNTER — Other Ambulatory Visit: Payer: Self-pay

## 2020-12-15 ENCOUNTER — Ambulatory Visit (INDEPENDENT_AMBULATORY_CARE_PROVIDER_SITE_OTHER): Payer: 59

## 2020-12-15 DIAGNOSIS — I240 Acute coronary thrombosis not resulting in myocardial infarction: Secondary | ICD-10-CM

## 2020-12-15 DIAGNOSIS — Z5181 Encounter for therapeutic drug level monitoring: Secondary | ICD-10-CM | POA: Diagnosis not present

## 2020-12-15 LAB — POCT INR: INR: 3.1 — AB (ref 2.0–3.0)

## 2020-12-15 NOTE — Patient Instructions (Signed)
-   have a large serving of greens tonight, then  - continue dosage of warfarin 5 mg every day - Recheck INR in 6 weeks

## 2020-12-24 MED FILL — ROSUVASTATIN CALCIUM 5 MG T: 5 | 90 days supply | Qty: 90 | Fill #0

## 2020-12-24 MED FILL — ENTRESTO 49 MG-51 MG TABLET: 49-51 | 30 days supply | Qty: 60 | Fill #2

## 2021-01-07 ENCOUNTER — Other Ambulatory Visit: Payer: Self-pay

## 2021-01-07 ENCOUNTER — Ambulatory Visit (HOSPITAL_COMMUNITY)
Admission: RE | Admit: 2021-01-07 | Discharge: 2021-01-07 | Disposition: A | Discharge status: Home / Self Care | Payer: 59 | Source: Ambulatory Visit | Attending: Cardiology | Admitting: Cardiology

## 2021-01-07 ENCOUNTER — Ambulatory Visit (HOSPITAL_BASED_OUTPATIENT_CLINIC_OR_DEPARTMENT_OTHER)
Admission: RE | Admit: 2021-01-07 | Discharge: 2021-01-07 | Disposition: A | Discharge status: Home / Self Care | Payer: 59 | Source: Ambulatory Visit | Attending: Cardiology | Admitting: Cardiology

## 2021-01-07 ENCOUNTER — Other Ambulatory Visit (HOSPITAL_COMMUNITY): Payer: Self-pay | Admitting: Cardiology

## 2021-01-07 ENCOUNTER — Encounter (HOSPITAL_COMMUNITY): Payer: Self-pay | Admitting: Cardiology

## 2021-01-07 VITALS — BP 126/78 | HR 64 | Wt 161.8 lb

## 2021-01-07 DIAGNOSIS — I2510 Atherosclerotic heart disease of native coronary artery without angina pectoris: Secondary | ICD-10-CM | POA: Diagnosis not present

## 2021-01-07 DIAGNOSIS — Z86718 Personal history of other venous thrombosis and embolism: Secondary | ICD-10-CM | POA: Diagnosis not present

## 2021-01-07 DIAGNOSIS — Z8249 Family history of ischemic heart disease and other diseases of the circulatory system: Secondary | ICD-10-CM | POA: Diagnosis not present

## 2021-01-07 DIAGNOSIS — Z79899 Other long term (current) drug therapy: Secondary | ICD-10-CM | POA: Diagnosis not present

## 2021-01-07 DIAGNOSIS — I739 Peripheral vascular disease, unspecified: Secondary | ICD-10-CM

## 2021-01-07 DIAGNOSIS — Z7901 Long term (current) use of anticoagulants: Secondary | ICD-10-CM | POA: Insufficient documentation

## 2021-01-07 DIAGNOSIS — Z8711 Personal history of peptic ulcer disease: Secondary | ICD-10-CM | POA: Insufficient documentation

## 2021-01-07 DIAGNOSIS — E785 Hyperlipidemia, unspecified: Secondary | ICD-10-CM | POA: Diagnosis not present

## 2021-01-07 DIAGNOSIS — Z8673 Personal history of transient ischemic attack (TIA), and cerebral infarction without residual deficits: Secondary | ICD-10-CM | POA: Insufficient documentation

## 2021-01-07 DIAGNOSIS — Z87891 Personal history of nicotine dependence: Secondary | ICD-10-CM | POA: Diagnosis not present

## 2021-01-07 DIAGNOSIS — I5022 Chronic systolic (congestive) heart failure: Secondary | ICD-10-CM

## 2021-01-07 LAB — COMPREHENSIVE METABOLIC PANEL
ALT: 13 U/L (ref 0–44)
AST: 18 U/L (ref 15–41)
Albumin: 3.8 g/dL (ref 3.5–5.0)
Alkaline Phosphatase: 104 U/L (ref 38–126)
Anion gap: 11 (ref 5–15)
BUN: 16 mg/dL (ref 6–20)
CO2: 21 mmol/L — ABNORMAL LOW (ref 22–32)
Calcium: 9.2 mg/dL (ref 8.9–10.3)
Chloride: 105 mmol/L (ref 98–111)
Creatinine, Ser: 1.17 mg/dL — ABNORMAL HIGH (ref 0.44–1.00)
GFR, Estimated: 55 mL/min — ABNORMAL LOW (ref >60)
Glucose, Bld: 131 mg/dL — ABNORMAL HIGH (ref 70–99)
Potassium: 4.9 mmol/L (ref 3.5–5.1)
Sodium: 137 mmol/L (ref 135–145)
Total Bilirubin: 0.8 mg/dL (ref 0.3–1.2)
Total Protein: 8 g/dL (ref 6.5–8.1)

## 2021-01-07 LAB — CBC
HCT: 42.1 % (ref 36.0–46.0)
Hemoglobin: 13.6 g/dL (ref 12.0–15.0)
MCH: 31.3 pg (ref 26.0–34.0)
MCHC: 32.3 g/dL (ref 30.0–36.0)
MCV: 96.8 fL (ref 80.0–100.0)
Platelets: 313 10*3/uL (ref 150–400)
RBC: 4.35 MIL/uL (ref 3.87–5.11)
RDW: 13 % (ref 11.5–15.5)
WBC: 11.1 10*3/uL — ABNORMAL HIGH (ref 4.0–10.5)
nRBC: 0 % (ref 0.0–0.2)

## 2021-01-07 LAB — ECHOCARDIOGRAM COMPLETE
Area-P 1/2: 2.66 cm2
Calc EF: 51.6 %
S' Lateral: 3.9 cm
Single Plane A2C EF: 60.6 %
Single Plane A4C EF: 41.6 %

## 2021-01-07 MED ORDER — ASPIRIN EC 81 MG PO TBEC: 81.0000 mg | DELAYED_RELEASE_TABLET | Freq: Every day | ORAL | 11 refills | Status: DC

## 2021-01-07 MED FILL — ASPIRIN 81 MG TBEC: 81 | 30 days supply | Qty: 30 | Fill #0

## 2021-01-07 NOTE — Progress Notes (Signed)
  Echocardiogram 2D Echocardiogram has been performed.  Briana Andrews 01/07/2021, 1:33 PM

## 2021-01-07 NOTE — Patient Instructions (Addendum)
Labs done today. We will contact you only if your labs are abnormal.  STOP taking Warfarin  START Asprin 81mg  (1 tablet) by mouth daily.   No other medication changes were made. Please continue all current medications as prescribed.  Your physician recommends that you schedule a follow-up appointment in: 6 months. Please contact our office in July to schedule an August appointment.  If you have any questions or concerns before your next appointment please send September a message through [employer] or call our office at [phone].    TO LEAVE A MESSAGE FOR THE NURSE SELECT OPTION 2, PLEASE LEAVE A MESSAGE INCLUDING: . YOUR NAME . DATE OF BIRTH . CALL BACK NUMBER . REASON FOR CALL**this is important as we prioritize the call backs  YOU WILL RECEIVE A CALL BACK THE SAME DAY AS LONG AS YOU CALL BEFORE 4:00 PM   Do the following things EVERYDAY: 1) Weigh yourself in the morning before breakfast. Write it down and keep it in a log. 2) Take your medicines as prescribed 3) Eat low salt foods--Limit salt (sodium) to 2000 mg per day.  4) Stay as active as you can everyday 5) Limit all fluids for the day to less than 2 liters   At the [facility], you and your health needs are our priority. As part of our continuing mission to provide you with exceptional heart care, we have created designated Provider Care Teams. These Care Teams include your primary Cardiologist (physician) and Advanced Practice Providers (APPs- Physician Assistants and Nurse Practitioners) who all work together to provide you with the care you need, when you need it.   You may see any of the following providers on your designated Care Team at your next follow up: [phone] Dr Marland Kitchen . Dr Arvilla Meres . Marca Ancona, NP . Tonye Becket, PA . Robbie Lis, PharmD   Please be sure to bring in all your medications bottles to every appointment.

## 2021-01-09 NOTE — Progress Notes (Signed)
PCP: None  Cardiologist: Dr Shirlee Latch Vascular : Dr Darrick Penna  Ortho: Dr Lajoyce Corners  [facility]   HPI: Briana Andrews is a 55 y.o. alcoholic woman with a history of R frontal CVA, B popliteal artery occlusions, hepatic failure, GI bleeding, cirrhosis, ETOH abuse, and chronic systolic heart failure.  Admitted 08/15/19 with increased sob and confusion. Treated for PNA and MRI brain showed R ACA stroke. Hospital course complicated new onset acute systolic heart failure possibly due to ETOH abuse. Hospital course complicated by LV thrombus, hepatitis, ARF, lower extremity cardioembolism, and shock.  She had suspected cardioembolism to bilateral popliteal arteries and underwent mechanical thrombectomy.  Received PRBCs and was able to undergo EGD 9/29 that showed duodenal and gastric ulcers which were treated endoscopically. Continued on PPI. Heparin has been converted to coumadin. R foot wound was to be followed by Vascular in the community  Discharged to SNF. On 09/21/19 she presented to the [facility] after a fall that resulted in head laceration. CT scan was unremarkable. She left SNF on 09/27/19 because she was not taken to any of her follow up appointments.    She has had follow up with VVS/Ortho for R foot wound and had debridement 10/14/19.   Echo in 3/21 showed EF increased to 45-50%, mild LV dilation, normal RV.   Echo was done today and reviewed, EF up to 55-60% with normal RV.   Today she returns for followup of CHF.  Right foot ulcer has healed.  She is in NSR today.  Weight is stable.  No chest pain.  No significant exertional dyspnea.  No orthopnea/PND.  Rarely drinks ETOH.   ECG (personally reviewed): NSR, normal.   Labs (11/20): K 4.2, creatinine 0.97 Labs (12/20): LDL 55, TGs 259 Labs (3/21): K 4.3, 0.99 Labs (5/21): LDL 71, HDL 38, TGs 134 Labs (6/21): K 4.8, creatinine 1.05 Labs (11/21): LDL 74, HDL 30 Labs (12/21): K 4.8, creatinine 1.13  1. Chronic systolic CHF: Primarily  nonischemic cardiomyopathy.   - Echo (9/20): EF 15-20% with LV apical thrombus. Severely decreased RV function.  - RHC (10/20): RA 6, CI 3.7, PVR 3.7  - LHC (10/20): LAD 40%, Left Circumflex 50% , 80% proximal RCA, 60% mid PDA stenosis - Echo (3/21): EF increased to 45-50%, mild LV dilation, normal RV.  - Echo (2/22): EF 55-60%, normal RV.  2. GI Bleed: Gastric and duodenal ulcers in 9/20.  3. R frontal CVA: In 9/20, due to LV thrombus.  4. 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   - ABIs (11/20): Normal.  5. 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  Liver imaging did not show cirrhosis or portal vein thrombosis. RV failure/congestive hepatopathy may have played a role.  6. ETOH Abuse  7. LV thrombus 8. CAD: LHC 10/20 LAD 40%, Left Circumflex 50% , RCA 80% Proximal RCA 60% mid PDA stenosis.  9. DVT: Right popliteal vein in 9/20.   ROS: All systems negative except as listed in HPI, PMH and Problem List.  SH:  Social History   Socioeconomic History  . Marital status: Married    Spouse name: Not on file  . Number of children: Not on file  . Years of education: Not on file  . Highest education level: Not on file  Occupational History  . Not on file  Tobacco Use  . Smoking status: Former Smoker    Years: 39.00    Quit date: 06/2019    Years since quitting: 1.5  . Smokeless  tobacco: Never Used  Vaping Use  . Vaping Use: Never used  Substance and Sexual Activity  . Alcohol use: Not Currently    Comment: alcoholism - last drink in august 2020  . Drug use: Not Currently    Types: Marijuana    Comment: none since August 2020  . Sexual activity: Not on file  Other Topics Concern  . Not on file  Social History Narrative  . Not on file   Social Determinants of Health   Financial Resource Strain: Not on file  Food Insecurity: Not on file  Transportation Needs: Not on file  Physical Activity: Not on file  Stress: Not on file  Social  Connections: Not on file  Intimate Partner Violence: Not on file    FH:  Family History  Problem Relation Age of Onset  . Heart disease Mother     Current Outpatient Medications  Medication Sig Dispense Refill  . aspirin EC 81 MG tablet Take 1 tablet (81 mg total) by mouth daily. Swallow whole. 30 tablet 11  . carvedilol (COREG) 6.25 MG tablet Take 1 tablet (6.25 mg total) by mouth 2 (two) times daily with a meal. 60 tablet 11  . folic acid (FOLVITE) 1 MG tablet Take 1 tablet (1 mg total) by mouth daily. 90 tablet 3  . furosemide (LASIX) 20 MG tablet Take 1 tablet (20 mg total) by mouth daily as needed (fluid retention.). 30 tablet 11  . ibuprofen (ADVIL) 200 MG tablet Take 200 mg by mouth every 6 (six) hours as needed for headache or moderate pain.    Marland Kitchen icosapent Ethyl (VASCEPA) 1 g capsule Take 2 capsules (2 g total) by mouth 2 (two) times daily. 120 capsule 5  . pantoprazole (PROTONIX) 40 MG tablet Take 1 tablet (40 mg total) by mouth daily. 90 tablet 3  . rosuvastatin (CRESTOR) 5 MG tablet Take 1 tablet (5 mg total) by mouth daily. 30 tablet 11  . sacubitril-valsartan (ENTRESTO) 49-51 MG Take 1 tablet by mouth 2 (two) times daily. 60 tablet 3  . spironolactone (ALDACTONE) 25 MG tablet Take 1 tablet (25 mg total) by mouth daily. 90 tablet 1   No current facility-administered medications for this encounter.    Vitals:   01/07/21 1349  BP: 126/78  Pulse: 64  SpO2: 97%  Weight: 73.4 kg (161 lb 12.8 oz)   Wt Readings from Last 3 Encounters:  01/07/21 73.4 kg (161 lb 12.8 oz)  10/05/20 71.9 kg (158 lb 9.6 oz)  06/24/20 72.1 kg (159 lb)    PHYSICAL EXAM: General: NAD Neck: No JVD, no thyromegaly or thyroid nodule.  Lungs: Clear to auscultation bilaterally with normal respiratory effort. CV: Nondisplaced PMI.  Heart regular S1/S2, no S3/S4, no murmur.  No peripheral edema.  No carotid bruit.  Normal pedal pulses.  Abdomen: Soft, nontender, no hepatosplenomegaly, no distention.   Skin: Intact without lesions or rashes.  Neurologic: Alert and oriented x 3.  Psych: Normal affect. Extremities: No clubbing or cyanosis.  HEENT: Normal.   ASSESSMENT & PLAN: 1. Chronic systolic CHF:  Cardiogenic shock in 9/20, echo 07/2019 with EF 15-20%, mild LV dilation, LV thrombus, mild-moderate MR, severely decreased RV systolic function.  LHC/RHC done 10/20 showing normal filling pressures and preserved cardiac output.  There was diffuse coronary disease, especially involving distal and branch vessels, only possible interventional target was 80% proximal RCA stenosis.  No intervention given good flow down the vessel and unlikely to significantly improve LV function.  Suspect  that the cardiomyopathy is primarily nonischemic due to ETOH, though coronary disease may play a role. Echo in 3/21 showed EF up to 45-50% and echo today showed EF up to 55-60%.  NYHA class 1, not volume overloaded on exam.  - Continue Coreg 6.25 mg bid.   - Continue Entresto 49/51 bid. BMET today.  - Continue spironolactone 25 mg daily.   - Continue to abstain from ETOH.  2. Suspected ETOH hepatitis: Elevated LFTs during 9/20 admission.  However liver imaging with [place] and CT has NOT shown cirrhosis or portal/hepatic vein thrombosis. Question has arisen whether this might possibly be due primarily to RV failure.  - Check LFTs today.  3.  LV thrombus: Possible cause of embolic event to popliteal arteries as well as subacute CVA.  - Stop warfarin with EF back to normal range, start ASA 81 daily.  4. PAD: Bilateral popliteal artery occlusions, suspect cardioembolic from LV thrombus. Now s/p mechanical thrombectomy by vascular.  Right foot wound followed by Dr. Lajoyce Corners, has had skin graft and now mostly healed.   5. CVA: Subacute right ACA CVA in 9/20. Likely from LV thrombus.  - With recovery of EF, she can stop warfarin and start ASA 81 daily.  6. Right popliteal vein DVT: 9/20.  - As above, plan to stop warfarin and  start ASA 81.  7. H/O GI bleeding: Upper GI bleeding, EGD with bleeding duodenal and gastric ulcer in 9/20 that were treated, no varices.  - Continue Protonix 8. H/O ETOH abuse: She has quit drinking (except for occasions) now since August 2020.  This may be helping LV function.   9. CAD: Extensive distal and branch vessel coronary disease on cath 10/20.  Only possible interventional target was 80% proximal RCA stenosis.  However, there was good flow down the RCA and I do not think intervention on this lesion would appreciably improve her LV function.  No chest pain.  - Continue Crestor 5 mg daily. Good lipids in 11/21.   - Triglycerides controlled on Vascepa  - Start ASA after stopping warfarin.   Followup in 6 months.    Marca Ancona 01/09/2021

## 2021-01-11 ENCOUNTER — Encounter: Payer: Self-pay | Admitting: Physician Assistant

## 2021-01-11 ENCOUNTER — Ambulatory Visit (INDEPENDENT_AMBULATORY_CARE_PROVIDER_SITE_OTHER): Payer: 59 | Admitting: Orthopedic Surgery

## 2021-01-11 DIAGNOSIS — L02611 Cutaneous abscess of right foot: Secondary | ICD-10-CM

## 2021-01-11 DIAGNOSIS — I739 Peripheral vascular disease, unspecified: Secondary | ICD-10-CM

## 2021-01-11 NOTE — Progress Notes (Signed)
Office Visit Note   Patient: Briana Andrews           Date of Birth: 1966/11/03           MRN: [id number] Visit Date: 01/11/2021              Requested by: No referring provider defined for this encounter. PCP: Patient, [name]  Chief Complaint  Patient presents with  . Right Foot - Follow-up      HPI: Patient is a 55 year old woman status post skin graft to the dorsum of the right foot.  Patient feels like she is doing better.  Assessment & Plan: Visit Diagnoses:  1. PVD (peripheral vascular disease) (HCC)   2. Cutaneous abscess of right foot     Plan: A size large knee-high compression sock was applied she will wear this around-the-clock and change as needed.  Follow-Up Instructions: No follow-ups on file.   Ortho Exam  Patient is alert, oriented, no adenopathy, well-dressed, normal affect, normal respiratory effort. Examination she does have venous swelling in the right lower extremity the dorsal foot ulcer of the right measures 15 x 40 mm and 1 mm deep there is 75% healthy granulation tissue with a small amount of fibrinous tissue this was debrided with a 4 x 4 gauze.  Imaging: No results found. No images are attached to the encounter.  Labs: Lab Results  Component Value Date   HGBA1C 5.8 (H) 08/17/2019   ESRSEDRATE 0 08/16/2019   REPTSTATUS 08/19/2019 FINAL 08/18/2019   CULT  08/18/2019    NO GROWTH Performed at [facility], [address] [place], [place] [address]      Lab Results  Component Value Date   ALBUMIN 3.8 01/07/2021   ALBUMIN 3.7 10/05/2020   ALBUMIN 3.1 (L) 11/25/2019    Lab Results  Component Value Date   MG 2.1 09/08/2019   MG 1.4 (L) 09/07/2019   MG 1.9 09/02/2019   No results found for: VD25OH  No results found for: PREALBUMIN CBC EXTENDED Latest Ref Rng & Units 01/07/2021 11/25/2019 10/15/2019  WBC 4.0 - 10.5 K/uL 11.1(H) 8.7 7.4  RBC 3.87 - 5.11 MIL/uL 4.35 3.62(L) 3.38(L)  HGB 12.0 - 15.0 g/dL 14.4 11.2(L)  10.6(L)  HCT 36.0 - 46.0 % 42.1 34.9(L) 32.8(L)  PLT 150 - 400 K/uL 313 449(H) 535(H)  NEUTROABS 1.7 - 7.7 K/uL - - -  LYMPHSABS 0.7 - 4.0 K/uL - - -     There is no height or weight on file to calculate BMI.  Orders:  No orders of the defined types were placed in this encounter.  No orders of the defined types were placed in this encounter.    Procedures: No procedures performed  Clinical Data: No additional findings.  ROS:  All other systems negative, except as noted in the HPI. Review of Systems  Objective: Vital Signs: There were no vitals taken for this visit.  Specialty Comments:  No specialty comments available.  PMFS History: Patient Active Problem List   Diagnosis Date Noted  . Gangrene of right foot (HCC)   . Cutaneous abscess of right foot   . Endotracheally intubated   . Acute on chronic systolic CHF (congestive heart failure) (HCC)   . GI bleed 08/26/2019  . Pressure injury of skin 08/23/2019  . Acute upper GI bleed   . Shock circulatory (HCC)   . Palliative care by specialist   . Goals of care, counseling/discussion   . Advanced  care planning/counseling discussion   . Jaundice   . Cerebral embolism with cerebral infarction 08/17/2019  . PVD (peripheral vascular disease) (HCC) 08/17/2019  . 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 Acute CHF (congestive heart failure) (HCC) 08/17/2019  . Abnormal LFTs 08/16/2019  . Acute metabolic encephalopathy 08/16/2019  . SOB (shortness of breath) 08/16/2019  . AKI (acute kidney injury) (HCC) 08/16/2019  . Fall 08/16/2019  . Lactic acidosis 08/16/2019  . Back pain 08/16/2019  . Liver failure without hepatic coma (HCC) 08/16/2019  . Multifocal pneumonia 08/16/2019  . Alcohol abuse   . Tobacco abuse   . Shortness of breath    Past Medical History:  Diagnosis Date  . Alcohol abuse   . CHF (congestive heart failure) (HCC)    last EF 35-40%. etoh induced  . Coronary artery disease     80%RCA, 50%LCX, 40%LAD  . Depression   . DVT (deep venous thrombosis) (HCC)    right popliteal vein (08/17/19)  . GERD (gastroesophageal reflux disease)   . Hyperlipidemia   . LV (left ventricular) mural thrombus   . Stroke [facility])    / memory loss  . Tobacco abuse     Family History  Problem Relation Age of Onset  . Heart disease Mother     Past Surgical History:  Procedure Laterality Date  . ABDOMINAL AORTOGRAM W/LOWER EXTREMITY N/A 08/19/2019   Procedure: ABDOMINAL AORTOGRAM W/LOWER EXTREMITY;  Surgeon: Nada Libman, MD;  Location: [facility] INVASIVE CV LAB;  Service: Cardiovascular;  Laterality: N/A;  . ABDOMINAL AORTOGRAM W/LOWER EXTREMITY Bilateral 08/20/2019   Procedure: ABDOMINAL AORTOGRAM W/LOWER EXTREMITY;  Surgeon: Cephus Shelling, MD;  Location: [facility] INVASIVE CV LAB;  Service: Cardiovascular;  Laterality: Bilateral;  . DEBRIDEMENT  FOOT Right 10/15/2019  . ESOPHAGOGASTRODUODENOSCOPY N/A 08/26/2019   Procedure: ESOPHAGOGASTRODUODENOSCOPY (EGD);  Surgeon: Charlott Rakes, MD;  Location: [facility] ENDOSCOPY;  Service: Endoscopy;  Laterality: N/A;  . HOT HEMOSTASIS N/A 08/26/2019   Procedure: HOT HEMOSTASIS (ARGON PLASMA COAGULATION/BICAP);  Surgeon: Charlott Rakes, MD;  Location[facility] ENDOSCOPY;  Service: Endoscopy;  Laterality: N/A;  . I & D EXTREMITY Right 10/15/2019   Procedure: RIGHT FOOT DEBRIDEMENT;  Surgeon: Nadara Mustard, MD;  Location: [facility] OR;  Service: Orthopedics;  Laterality: Right;  . PERIPHERAL VASCULAR BALLOON ANGIOPLASTY  08/19/2019   Procedure: PERIPHERAL VASCULAR BALLOON ANGIOPLASTY;  Surgeon: Nada Libman, MD;  Location: [facility] INVASIVE CV LAB;  Service: Cardiovascular;;  Right femoral popliteal and PT   . PERIPHERAL VASCULAR INTERVENTION Left 08/20/2019   Procedure: PERIPHERAL VASCULAR INTERVENTION;  Surgeon: Cephus Shelling, MD;  Location: [facility] INVASIVE CV LAB;  Service: Cardiovascular;  Laterality: Left;  . RIGHT/LEFT HEART CATH AND CORONARY ANGIOGRAPHY N/A  09/02/2019   Procedure: RIGHT/LEFT HEART CATH AND CORONARY ANGIOGRAPHY;  Surgeon: Laurey Morale, MD;  Location: [facility] INVASIVE CV LAB;  Service: Cardiovascular;  Laterality: N/A;  . SCLEROTHERAPY  08/26/2019   Procedure: SCLEROTHERAPY;  Surgeon: Charlott Rakes, MD;  Location: [facility] ENDOSCOPY;  Service: Endoscopy;;   Social History   Occupational History  . Not on file  Tobacco Use  . Smoking status: Former Smoker    Years: 39.00    Quit date: 06/2019    Years since quitting: 1.5  . Smokeless tobacco: Never Used  Vaping Use  . Vaping Use: Never used  Substance and Sexual Activity  . Alcohol use: Not Currently    Comment: alcoholism - last drink in august 2020  . Drug use: Not Currently  Types: Marijuana    Comment: none since August 2020  . Sexual activity: Not on file

## 2021-01-17 MED FILL — PANTOPRAZOLE SOD DR 40 MG T: 40 | 60 days supply | Qty: 60 | Fill #1

## 2021-01-31 MED FILL — CARVEDILOL 6.25 MG TABLET: 6.25 | 90 days supply | Qty: 180 | Fill #1

## 2021-01-31 MED FILL — ENTRESTO 49 MG-51 MG TABLET: 49-51 | 30 days supply | Qty: 60 | Fill #3

## 2021-02-09 ENCOUNTER — Encounter: Payer: Self-pay | Admitting: Physician Assistant

## 2021-02-09 ENCOUNTER — Ambulatory Visit (INDEPENDENT_AMBULATORY_CARE_PROVIDER_SITE_OTHER): Payer: 59 | Admitting: Physician Assistant

## 2021-02-09 ENCOUNTER — Other Ambulatory Visit: Payer: Self-pay

## 2021-02-09 DIAGNOSIS — L8990 Pressure ulcer of unspecified site, unspecified stage: Secondary | ICD-10-CM

## 2021-02-09 NOTE — Progress Notes (Signed)
Office Visit Note   Patient: Briana Andrews           Date of Birth: 13-Oct-1966           MRN: [id number] Visit Date: 02/09/2021              Requested by: No referring provider defined for this encounter. PCP: Patient, [name]  Chief Complaint  Patient presents with  . Right Foot - Follow-up      HPI: Patient presents in follow-up for her right foot.  She is a year and a half status post right foot debridement with skin grafting.  She continues to make great progress.  Assessment & Plan: Visit Diagnoses: No diagnosis found.  Plan: Patient will follow up in a month continue wearing a compression sock.  Follow-Up Instructions: No follow-ups on file.   Ortho Exam  Patient is alert, oriented, no adenopathy, well-dressed, normal affect, normal respiratory effort. Examination demonstrates a area still open is about 2 cm wide and 4 cm long.  Eschared tissue was debrided around the wound edges excellent epithelialization she has about 75% fibrous tissue.  She did have 1 visible staple from the skin graft which was removed without difficulty Imaging: No results found. No images are attached to the encounter.  Labs: Lab Results  Component Value Date   HGBA1C 5.8 (H) 08/17/2019   ESRSEDRATE 0 08/16/2019   REPTSTATUS 08/19/2019 FINAL 08/18/2019   CULT  08/18/2019    NO GROWTH Performed at [facility] Lab, [address], [place], [place] [address]      Lab Results  Component Value Date   ALBUMIN 3.8 01/07/2021   ALBUMIN 3.7 10/05/2020   ALBUMIN 3.1 (L) 11/25/2019    Lab Results  Component Value Date   MG 2.1 09/08/2019   MG 1.4 (L) 09/07/2019   MG 1.9 09/02/2019   No results found for: VD25OH  No results found for: PREALBUMIN CBC EXTENDED Latest Ref Rng & Units 01/07/2021 11/25/2019 10/15/2019  WBC 4.0 - 10.5 K/uL 11.1(H) 8.7 7.4  RBC 3.87 - 5.11 MIL/uL 4.35 3.62(L) 3.38(L)  HGB 12.0 - 15.0 g/dL 48.5 11.2(L) 10.6(L)  HCT 36.0 - 46.0 % 42.1 34.9(L)  32.8(L)  PLT 150 - 400 K/uL 313 449(H) 535(H)  NEUTROABS 1.7 - 7.7 K/uL - - -  LYMPHSABS 0.7 - 4.0 K/uL - - -     There is no height or weight on file to calculate BMI.  Orders:  No orders of the defined types were placed in this encounter.  No orders of the defined types were placed in this encounter.    Procedures: No procedures performed  Clinical Data: No additional findings.  ROS:  All other systems negative, except as noted in the HPI. Review of Systems  Objective: Vital Signs: There were no vitals taken for this visit.  Specialty Comments:  No specialty comments available.  PMFS History: Patient Active Problem List   Diagnosis Date Noted  . Gangrene of right foot (HCC)   . Cutaneous abscess of right foot   . Endotracheally intubated   . Acute on chronic systolic CHF (congestive heart failure) (HCC)   . GI bleed 08/26/2019  . Pressure injury of skin 08/23/2019  . Acute upper GI bleed   . Shock circulatory (HCC)   . Palliative care by specialist   . Goals of care, counseling/discussion   . Advanced care planning/counseling discussion   . Jaundice   . Cerebral embolism with cerebral infarction  08/17/2019  . PVD (peripheral vascular disease) (HCC) 08/17/2019  . 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 Acute CHF (congestive heart failure) (HCC) 08/17/2019  . Abnormal LFTs 08/16/2019  . Acute metabolic encephalopathy 08/16/2019  . SOB (shortness of breath) 08/16/2019  . AKI (acute kidney injury) (HCC) 08/16/2019  . Fall 08/16/2019  . Lactic acidosis 08/16/2019  . Back pain 08/16/2019  . Liver failure without hepatic coma (HCC) 08/16/2019  . Multifocal pneumonia 08/16/2019  . Alcohol abuse   . Tobacco abuse   . Shortness of breath    Past Medical History:  Diagnosis Date  . Alcohol abuse   . CHF (congestive heart failure) (HCC)    last EF 35-40%. etoh induced  . Coronary artery disease    80%RCA, 50%LCX, 40%LAD  .  Depression   . DVT (deep venous thrombosis) (HCC)    right popliteal vein (08/17/19)  . GERD (gastroesophageal reflux disease)   . Hyperlipidemia   . LV (left ventricular) mural thrombus   . Stroke [facility])    / memory loss  . Tobacco abuse     Family History  Problem Relation Age of Onset  . Heart disease Mother     Past Surgical History:  Procedure Laterality Date  . ABDOMINAL AORTOGRAM W/LOWER EXTREMITY N/A 08/19/2019   Procedure: ABDOMINAL AORTOGRAM W/LOWER EXTREMITY;  Surgeon: Nada Libman, MD;  [facility];  Service: Cardiovascular;  Laterality: N/A;  . ABDOMINAL AORTOGRAM W/LOWER EXTREMITY Bilateral 08/20/2019   Procedure: ABDOMINAL AORTOGRAM W/LOWER EXTREMITY;  Surgeon: Cephus Shelling, MD;  [facility];  Service: Cardiovascular;  Laterality: Bilateral;  . DEBRIDEMENT  FOOT Right 10/15/2019  . ESOPHAGOGASTRODUODENOSCOPY N/A 08/26/2019   Procedure: ESOPHAGOGASTRODUODENOSCOPY (EGD);  Surgeon: Charlott Rakes, MD;  Location: [facility] ENDOSCOPY;  Service: Endoscopy;  Laterality: N/A;  . HOT HEMOSTASIS N/A 08/26/2019   Procedure: HOT HEMOSTASIS (ARGON PLASMA COAGULATION/BICAP);  Surgeon: Charlott Rakes, MD;  Location: [facility] ENDOSCOPY;  Service: Endoscopy;  Laterality: N/A;  . I & D EXTREMITY Right 10/15/2019   Procedure: RIGHT FOOT DEBRIDEMENT;  Surgeon: Nadara Mustard, MD;  Location: [facility] OR;  Service: Orthopedics;  Laterality: Right;  . PERIPHERAL VASCULAR BALLOON ANGIOPLASTY  08/19/2019   Procedure: PERIPHERAL VASCULAR BALLOON ANGIOPLASTY;  Surgeon: Nada Libman, MD;  [facility];  Service: Cardiovascular;;  Right femoral popliteal and PT   . PERIPHERAL VASCULAR INTERVENTION Left 08/20/2019   Procedure: PERIPHERAL VASCULAR INTERVENTION;  Surgeon: Cephus Shelling, MD;  Location: [facility] INVASIVE CV LAB;  Service: Cardiovascular;  Laterality: Left;  . RIGHT/LEFT HEART CATH AND CORONARY ANGIOGRAPHY N/A 09/02/2019   Procedure:  RIGHT/LEFT HEART CATH AND CORONARY ANGIOGRAPHY;  Surgeon: Laurey Morale, MD;  Location: [facility] INVASIVE CV LAB;  Service: Cardiovascular;  Laterality: N/A;  . SCLEROTHERAPY  08/26/2019   Procedure: SCLEROTHERAPY;  Surgeon: Charlott Rakes, MD;  Location: [facility] ENDOSCOPY;  Service: Endoscopy;;   Social History   Occupational History  . Not on file  Tobacco Use  . Smoking status: Former Smoker    Years: 39.00    Quit date: 06/2019    Years since quitting: 1.6  . Smokeless tobacco: Never Used  Vaping Use  . Vaping Use: Never used  Substance and Sexual Activity  . Alcohol use: Not Currently    Comment: alcoholism - last drink in august 2020  . Drug use: Not Currently    Types: Marijuana    Comment: none since August 2020  . Sexual  activity: Not on file

## 2021-02-17 ENCOUNTER — Other Ambulatory Visit (HOSPITAL_BASED_OUTPATIENT_CLINIC_OR_DEPARTMENT_OTHER): Payer: Self-pay

## 2021-02-22 ENCOUNTER — Ambulatory Visit: Payer: 59 | Admitting: Physician Assistant

## 2021-03-01 ENCOUNTER — Other Ambulatory Visit (HOSPITAL_COMMUNITY): Payer: Self-pay | Admitting: Cardiology

## 2021-03-01 ENCOUNTER — Other Ambulatory Visit (HOSPITAL_COMMUNITY): Payer: Self-pay

## 2021-03-01 MED ORDER — ENTRESTO 49-51 MG PO TABS
1.0000 | ORAL_TABLET | Freq: Two times a day (BID) | ORAL | 3 refills | Status: DC
  Filled 2021-03-01: qty 60, 30d supply, fill #0
  Filled 2021-03-28: qty 60, 30d supply, fill #1
  Filled 2021-05-03: qty 60, 30d supply, fill #2
  Filled 2021-06-06: qty 60, 30d supply, fill #3

## 2021-03-04 ENCOUNTER — Other Ambulatory Visit (HOSPITAL_COMMUNITY): Payer: Self-pay

## 2021-03-07 ENCOUNTER — Other Ambulatory Visit (HOSPITAL_COMMUNITY): Payer: Self-pay

## 2021-03-14 ENCOUNTER — Other Ambulatory Visit (HOSPITAL_COMMUNITY): Payer: Self-pay

## 2021-03-14 ENCOUNTER — Other Ambulatory Visit (HOSPITAL_COMMUNITY): Payer: Self-pay | Admitting: Cardiology

## 2021-03-14 MED ORDER — FOLIC ACID 1 MG PO TABS
ORAL_TABLET | Freq: Every day | ORAL | 3 refills | Status: DC
  Filled 2021-03-14: qty 90, 90d supply, fill #0
  Filled 2021-06-17: qty 90, 90d supply, fill #1
  Filled 2021-09-14: qty 90, 90d supply, fill #2
  Filled 2021-12-21: qty 90, 90d supply, fill #3

## 2021-03-14 MED FILL — Spironolactone Tab 25 MG: ORAL | 90 days supply | Qty: 90 | Fill #0 | Status: AC

## 2021-03-23 ENCOUNTER — Encounter: Payer: Self-pay | Admitting: Physician Assistant

## 2021-03-23 ENCOUNTER — Ambulatory Visit (INDEPENDENT_AMBULATORY_CARE_PROVIDER_SITE_OTHER): Payer: 59 | Admitting: Physician Assistant

## 2021-03-23 DIAGNOSIS — L8990 Pressure ulcer of unspecified site, unspecified stage: Secondary | ICD-10-CM

## 2021-03-23 NOTE — Progress Notes (Signed)
Office Visit Note   Patient: Briana Andrews           Date of Birth: 05-23-1966           MRN: [id number] Visit Date: 03/23/2021              Requested by: No referring provider defined for this encounter. PCP: Patient, [name] (Inactive)  No chief complaint on file.     HPI: Patient is a pleasant 55 year old woman who I follow periodically for her skin graft to the dorsum of her right foot.  She is here for recheck.  She wears her current medical compression socks every day.  Assessment & Plan: Visit Diagnoses: No diagnosis found.  Plan: Follow-up in 2 months sooner if any concerns  Follow-Up Instructions: No follow-ups on file.   Ortho Exam  Patient is alert, oriented, no adenopathy, well-dressed, normal affect, normal respiratory effort. She continues to have good epithelialization and progress.  There is healthy granulation tissue and good epithelialization.  No swelling no ascending cellulitis  Imaging: No results found.    Labs: Lab Results  Component Value Date   HGBA1C 5.8 (H) 08/17/2019   ESRSEDRATE 0 08/16/2019   REPTSTATUS 08/19/2019 FINAL 08/18/2019   CULT  08/18/2019    NO GROWTH Performed at [facility], [address], [place], [place] [address]      Lab Results  Component Value Date   ALBUMIN 3.8 01/07/2021   ALBUMIN 3.7 10/05/2020   ALBUMIN 3.1 (L) 11/25/2019    Lab Results  Component Value Date   MG 2.1 09/08/2019   MG 1.4 (L) 09/07/2019   MG 1.9 09/02/2019   No results found for: VD25OH  No results found for: PREALBUMIN CBC EXTENDED Latest Ref Rng & Units 01/07/2021 11/25/2019 10/15/2019  WBC 4.0 - 10.5 K/uL 11.1(H) 8.7 7.4  RBC 3.87 - 5.11 MIL/uL 4.35 3.62(L) 3.38(L)  HGB 12.0 - 15.0 g/dL 62.1 11.2(L) 10.6(L)  HCT 36.0 - 46.0 % 42.1 34.9(L) 32.8(L)  PLT 150 - 400 K/uL 313 449(H) 535(H)  NEUTROABS 1.7 - 7.7 K/uL - - -  LYMPHSABS 0.7 - 4.0 K/uL - - -     There is no height or weight on file to calculate  BMI.  Orders:  No orders of the defined types were placed in this encounter.  No orders of the defined types were placed in this encounter.    Procedures: No procedures performed  Clinical Data: No additional findings.  ROS:  All other systems negative, except as noted in the HPI. Review of Systems  Objective: Vital Signs: There were no vitals taken for this visit.  Specialty Comments:  No specialty comments available.  PMFS History: Patient Active Problem List   Diagnosis Date Noted  . Gangrene of right foot (HCC)   . Cutaneous abscess of right foot   . Endotracheally intubated   . Acute on chronic systolic CHF (congestive heart failure) (HCC)   . GI bleed 08/26/2019  . Pressure injury of skin 08/23/2019  . Acute upper GI bleed   . Shock circulatory (HCC)   . Palliative care by specialist   . Goals of care, counseling/discussion   . Advanced care planning/counseling discussion   . Jaundice   . Cerebral embolism with cerebral infarction 08/17/2019  . PVD (peripheral vascular disease) (HCC) 08/17/2019  . 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 Acute CHF (congestive heart failure) (HCC) 08/17/2019  . Abnormal LFTs 08/16/2019  .  Acute metabolic encephalopathy 08/16/2019  . SOB (shortness of breath) 08/16/2019  . AKI (acute kidney injury) (HCC) 08/16/2019  . Fall 08/16/2019  . Lactic acidosis 08/16/2019  . Back pain 08/16/2019  . Liver failure without hepatic coma (HCC) 08/16/2019  . Multifocal pneumonia 08/16/2019  . Alcohol abuse   . Tobacco abuse   . Shortness of breath    Past Medical History:  Diagnosis Date  . Alcohol abuse   . CHF (congestive heart failure) (HCC)    last EF 35-40%. etoh induced  . Coronary artery disease    80%RCA, 50%LCX, 40%LAD  . Depression   . DVT (deep venous thrombosis) (HCC)    right popliteal vein (08/17/19)  . GERD (gastroesophageal reflux disease)   . Hyperlipidemia   . LV (left ventricular)  mural thrombus   . Stroke [facility])    / memory loss  . Tobacco abuse     Family History  Problem Relation Age of Onset  . Heart disease Mother     Past Surgical History:  Procedure Laterality Date  . ABDOMINAL AORTOGRAM W/LOWER EXTREMITY N/A 08/19/2019   Procedure: ABDOMINAL AORTOGRAM W/LOWER EXTREMITY;  Surgeon: Nada Libman, MD;  [facility];  Service: Cardiovascular;  Laterality: N/A;  . ABDOMINAL AORTOGRAM W/LOWER EXTREMITY Bilateral 08/20/2019   Procedure: ABDOMINAL AORTOGRAM W/LOWER EXTREMITY;  Surgeon: Cephus Shelling, MD;  [facility];  Service: Cardiovascular;  Laterality: Bilateral;  . DEBRIDEMENT  FOOT Right 10/15/2019  . ESOPHAGOGASTRODUODENOSCOPY N/A 08/26/2019   Procedure: ESOPHAGOGASTRODUODENOSCOPY (EGD);  Surgeon: Charlott Rakes, MD;  Location: [facility] ENDOSCOPY;  Service: Endoscopy;  Laterality: N/A;  . HOT HEMOSTASIS N/A 08/26/2019   Procedure: HOT HEMOSTASIS (ARGON PLASMA COAGULATION/BICAP);  Surgeon: Charlott Rakes, MD;  Location: [facility] ENDOSCOPY;  Service: Endoscopy;  Laterality: N/A;  . I & D EXTREMITY Right 10/15/2019   Procedure: RIGHT FOOT DEBRIDEMENT;  Surgeon: Nadara Mustard, MD;  Location: [facility] OR;  Service: Orthopedics;  Laterality: Right;  . PERIPHERAL VASCULAR BALLOON ANGIOPLASTY  08/19/2019   Procedure: PERIPHERAL VASCULAR BALLOON ANGIOPLASTY;  Surgeon: Nada Libman, MD;  [facility];  Service: Cardiovascular;;  Right femoral popliteal and PT   . PERIPHERAL VASCULAR INTERVENTION Left 08/20/2019   Procedure: PERIPHERAL VASCULAR INTERVENTION;  Surgeon: Cephus Shelling, MD;  Location[facility] INVASIVE CV LAB;  Service: Cardiovascular;  Laterality: Left;  . RIGHT/LEFT HEART CATH AND CORONARY ANGIOGRAPHY N/A 09/02/2019   Procedure: RIGHT/LEFT HEART CATH AND CORONARY ANGIOGRAPHY;  Surgeon: Laurey Morale, MD;  Location: [facility] INVASIVE CV LAB;  Service: Cardiovascular;  Laterality: N/A;  . SCLEROTHERAPY  08/26/2019    Procedure: SCLEROTHERAPY;  Surgeon: Charlott Rakes, MD;  Location: [facility] ENDOSCOPY;  Service: Endoscopy;;   Social History   Occupational History  . Not on file  Tobacco Use  . Smoking status: Former Smoker    Years: 39.00    Quit date: 06/2019    Years since quitting: 1.7  . Smokeless tobacco: Never Used  Vaping Use  . Vaping Use: Never used  Substance and Sexual Activity  . Alcohol use: Not Currently    Comment: alcoholism - last drink in august 2020  . Drug use: Not Currently    Types: Marijuana    Comment: none since August 2020  . Sexual activity: Not on file

## 2021-03-25 ENCOUNTER — Other Ambulatory Visit (HOSPITAL_COMMUNITY): Payer: Self-pay

## 2021-03-28 ENCOUNTER — Other Ambulatory Visit (HOSPITAL_COMMUNITY): Payer: Self-pay | Admitting: Cardiology

## 2021-03-28 ENCOUNTER — Other Ambulatory Visit (HOSPITAL_COMMUNITY): Payer: Self-pay

## 2021-03-28 MED FILL — Rosuvastatin Calcium Tab 5 MG: ORAL | 90 days supply | Qty: 90 | Fill #0 | Status: AC

## 2021-03-31 ENCOUNTER — Other Ambulatory Visit (HOSPITAL_COMMUNITY): Payer: Self-pay

## 2021-04-06 ENCOUNTER — Other Ambulatory Visit (HOSPITAL_COMMUNITY): Payer: Self-pay

## 2021-04-07 ENCOUNTER — Other Ambulatory Visit (HOSPITAL_COMMUNITY): Payer: Self-pay

## 2021-05-03 ENCOUNTER — Other Ambulatory Visit (HOSPITAL_COMMUNITY): Payer: Self-pay | Admitting: Cardiology

## 2021-05-03 ENCOUNTER — Other Ambulatory Visit (HOSPITAL_COMMUNITY): Payer: Self-pay

## 2021-05-05 ENCOUNTER — Other Ambulatory Visit (HOSPITAL_COMMUNITY): Payer: Self-pay

## 2021-05-05 ENCOUNTER — Other Ambulatory Visit (HOSPITAL_COMMUNITY): Payer: Self-pay | Admitting: Cardiology

## 2021-05-09 ENCOUNTER — Other Ambulatory Visit (HOSPITAL_COMMUNITY): Payer: Self-pay

## 2021-05-09 MED ORDER — CARVEDILOL 6.25 MG PO TABS
ORAL_TABLET | Freq: Two times a day (BID) | ORAL | 3 refills | Status: DC
  Filled 2021-05-09: qty 180, 90d supply, fill #0
  Filled 2021-08-15: qty 180, 90d supply, fill #1
  Filled 2021-11-09: qty 180, 90d supply, fill #2
  Filled 2022-02-14: qty 180, 90d supply, fill #3

## 2021-05-25 ENCOUNTER — Other Ambulatory Visit: Payer: Self-pay

## 2021-05-25 ENCOUNTER — Encounter: Payer: Self-pay | Admitting: Physician Assistant

## 2021-05-25 ENCOUNTER — Ambulatory Visit (INDEPENDENT_AMBULATORY_CARE_PROVIDER_SITE_OTHER): Payer: 59 | Admitting: Physician Assistant

## 2021-05-25 DIAGNOSIS — I5023 Acute on chronic systolic (congestive) heart failure: Secondary | ICD-10-CM

## 2021-05-25 NOTE — Progress Notes (Signed)
Office Visit Note   Patient: Briana Andrews           Date of Birth: [birth year]           MRN: [id number] Visit Date: 05/25/2021              Requested by: No referring provider defined for this encounter. PCP: Patient, [name] (Inactive)  Chief Complaint  Patient presents with   Right Foot - Follow-up      HPI: Patient presents in follow-up for the skin grafting of her right dorsal foot.  She is continuing to wear her compression socks and feels she is doing quite well  Assessment & Plan: Visit Diagnoses: No diagnosis found.  Plan: Patient will follow-up in 2 months  Follow-Up Instructions: No follow-ups on file.   Ortho Exam  Patient is alert, oriented, no adenopathy, well-dressed, normal affect, normal respiratory effort. Patient has excellent epithelialization with a healthy wound bed.  Today the wound measures 5-1/2 x 2 cm.  There is no ascending cellulitis swelling is very well controlled no signs of infection  Imaging: No results found. No images are attached to the encounter.  Labs: Lab Results  Component Value Date   HGBA1C 5.8 (H) 08/17/2019   ESRSEDRATE 0 08/16/2019   REPTSTATUS 08/19/2019 FINAL 08/18/2019   CULT  08/18/2019    NO GROWTH Performed at [facility], [address], [place], [place] [address]      Lab Results  Component Value Date   ALBUMIN 3.8 01/07/2021   ALBUMIN 3.7 10/05/2020   ALBUMIN 3.1 (L) 11/25/2019    Lab Results  Component Value Date   MG 2.1 09/08/2019   MG 1.4 (L) 09/07/2019   MG 1.9 09/02/2019   No results found for: VD25OH  No results found for: PREALBUMIN CBC EXTENDED Latest Ref Rng & Units 01/07/2021 11/25/2019 10/15/2019  WBC 4.0 - 10.5 K/uL 11.1(H) 8.7 7.4  RBC 3.87 - 5.11 MIL/uL 4.35 3.62(L) 3.38(L)  HGB 12.0 - 15.0 g/dL 36.1 11.2(L) 10.6(L)  HCT 36.0 - 46.0 % 42.1 34.9(L) 32.8(L)  PLT 150 - 400 K/uL 313 449(H) 535(H)  NEUTROABS 1.7 - 7.7 K/uL - - -  LYMPHSABS 0.7 - 4.0 K/uL - - -      There is no height or weight on file to calculate BMI.  Orders:  No orders of the defined types were placed in this encounter.  No orders of the defined types were placed in this encounter.    Procedures: No procedures performed  Clinical Data: No additional findings.  ROS:  All other systems negative, except as noted in the HPI. Review of Systems  Objective: Vital Signs: There were no vitals taken for this visit.  Specialty Comments:  No specialty comments available.  PMFS History: Patient Active Problem List   Diagnosis Date Noted   Gangrene of right foot (HCC)    Cutaneous abscess of right foot    Endotracheally intubated    Acute on chronic systolic CHF (congestive heart failure) (HCC)    GI bleed 08/26/2019   Pressure injury of skin 08/23/2019   Acute upper GI bleed    Shock circulatory [facility])    Palliative care by specialist    Goals of care, counseling/discussion    Advanced care planning/counseling discussion    Jaundice    Cerebral embolism with cerebral infarction 08/17/2019   PVD (peripheral vascular disease) (HCC) 08/17/2019   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 etoh induced   Coronary artery disease    80%RCA, 50%LCX, 40%LAD   Depression    DVT (deep venous thrombosis) (HCC)    right popliteal vein (08/17/19)   GERD (gastroesophageal reflux disease)    Hyperlipidemia    LV  (left ventricular) mural thrombus    Stroke [facility])    / memory loss   Tobacco abuse     Family History  Problem Relation Age of Onset   Heart disease Mother     Past Surgical History:  Procedure Laterality Date   ABDOMINAL AORTOGRAM W/LOWER EXTREMITY N/A 08/19/2019   Procedure: ABDOMINAL AORTOGRAM W/LOWER EXTREMITY;  Surgeon: Nada Libman, MD;  [facility];  Service: Cardiovascular;  Laterality: N/A;   ABDOMINAL AORTOGRAM W/LOWER EXTREMITY Bilateral 08/20/2019   Procedure: ABDOMINAL AORTOGRAM W/LOWER EXTREMITY;  Surgeon: Cephus Shelling, MD;  [facility] LAB;  Service: Cardiovascular;  Laterality: Bilateral;   DEBRIDEMENT  FOOT Right 10/15/2019   ESOPHAGOGASTRODUODENOSCOPY N/A 08/26/2019   Procedure: ESOPHAGOGASTRODUODENOSCOPY (EGD);  Surgeon: Charlott Rakes, MD;  Location[facility] ENDOSCOPY;  Service: Endoscopy;  Laterality: N/A;   HOT HEMOSTASIS N/A 08/26/2019   Procedure: HOT HEMOSTASIS (ARGON PLASMA COAGULATION/BICAP);  Surgeon: Charlott Rakes, MD;  Location: [facility] ENDOSCOPY;  Service: Endoscopy;  Laterality: N/A;   I & D EXTREMITY Right 10/15/2019   Procedure: RIGHT FOOT DEBRIDEMENT;  Surgeon: Nadara Mustard, MD;  Location: [facility] OR;  Service: Orthopedics;  Laterality: Right;   PERIPHERAL VASCULAR BALLOON ANGIOPLASTY  08/19/2019   Procedure: PERIPHERAL VASCULAR BALLOON ANGIOPLASTY;  Surgeon: Nada Libman, MD;  [facility];  Service: Cardiovascular;;  Right femoral popliteal and PT    PERIPHERAL VASCULAR INTERVENTION Left 08/20/2019   Procedure: PERIPHERAL VASCULAR INTERVENTION;  Surgeon: Cephus Shelling, MD;  [facility] LAB;  Service: Cardiovascular;  Laterality: Left;   RIGHT/LEFT HEART CATH AND CORONARY ANGIOGRAPHY N/A 09/02/2019   Procedure: RIGHT/LEFT HEART CATH AND CORONARY ANGIOGRAPHY;  Surgeon: Laurey Morale, MD;  Location: [facility] INVASIVE CV LAB;  Service: Cardiovascular;  Laterality: N/A;   SCLEROTHERAPY   08/26/2019   Procedure: SCLEROTHERAPY;  Surgeon: Charlott Rakes, MD;  Location: [facility] ENDOSCOPY;  Service: Endoscopy;;   Social History   Occupational History   Not on file  Tobacco Use   Smoking status: Former    Years: 39.00    Pack years: 0.00    Types: Cigarettes    Quit date: 06/2019    Years since quitting: 1.9   Smokeless tobacco: Never  Vaping Use   Vaping Use: Never used  Substance and Sexual Activity   Alcohol use: Not Currently    Comment: alcoholism - last drink in august 2020   Drug use: Not Currently    Types: Marijuana    Comment: none since August 2020   Sexual activity: Not on file

## 2021-06-06 ENCOUNTER — Other Ambulatory Visit (HOSPITAL_COMMUNITY): Payer: Self-pay

## 2021-06-06 MED FILL — Aspirin Tab Delayed Release 81 MG: ORAL | 30 days supply | Qty: 30 | Fill #0 | Status: AC

## 2021-06-17 ENCOUNTER — Other Ambulatory Visit (HOSPITAL_COMMUNITY): Payer: Self-pay | Admitting: Cardiology

## 2021-06-17 ENCOUNTER — Other Ambulatory Visit (HOSPITAL_COMMUNITY): Payer: Self-pay

## 2021-06-21 ENCOUNTER — Other Ambulatory Visit (HOSPITAL_COMMUNITY): Payer: Self-pay

## 2021-06-21 MED ORDER — SPIRONOLACTONE 25 MG PO TABS
ORAL_TABLET | Freq: Every day | ORAL | 1 refills | Status: DC
  Filled 2021-06-21: qty 90, 90d supply, fill #0
  Filled 2021-09-14: qty 90, 90d supply, fill #1

## 2021-07-05 ENCOUNTER — Other Ambulatory Visit (HOSPITAL_COMMUNITY): Payer: Self-pay

## 2021-07-05 ENCOUNTER — Other Ambulatory Visit (HOSPITAL_COMMUNITY): Payer: Self-pay | Admitting: Cardiology

## 2021-07-05 MED FILL — Rosuvastatin Calcium Tab 5 MG: ORAL | 90 days supply | Qty: 90 | Fill #1 | Status: AC

## 2021-07-05 MED FILL — Aspirin Tab Delayed Release 81 MG: ORAL | 30 days supply | Qty: 30 | Fill #1 | Status: AC

## 2021-07-06 ENCOUNTER — Other Ambulatory Visit (HOSPITAL_COMMUNITY): Payer: Self-pay

## 2021-07-06 MED ORDER — ENTRESTO 49-51 MG PO TABS
1.0000 | ORAL_TABLET | Freq: Two times a day (BID) | ORAL | 3 refills | Status: DC
Start: 2021-07-06 — End: 2021-11-09
  Filled 2021-07-06: qty 60, 30d supply, fill #0
  Filled 2021-08-04: qty 60, 30d supply, fill #1
  Filled 2021-09-05: qty 60, 30d supply, fill #2
  Filled 2021-10-04: qty 60, 30d supply, fill #3

## 2021-07-27 ENCOUNTER — Ambulatory Visit (INDEPENDENT_AMBULATORY_CARE_PROVIDER_SITE_OTHER): Payer: 59 | Admitting: Physician Assistant

## 2021-07-27 ENCOUNTER — Encounter: Payer: Self-pay | Admitting: Physician Assistant

## 2021-07-27 DIAGNOSIS — L02611 Cutaneous abscess of right foot: Secondary | ICD-10-CM

## 2021-07-27 NOTE — Progress Notes (Signed)
Office Visit Note   Patient: Briana Andrews           Date of Birth: [birth year]           MRN: [id number] Visit Date: 07/27/2021              Requested by: No referring provider defined for this encounter. PCP: Patient, [name] (Inactive)  Chief Complaint  Patient presents with   Right Foot - Follow-up      HPI: Patient is a pleasant 55 year old woman who is now almost 2 years status post skin grafting to the dorsum of her right foot.  She has been wearing her medical compression sock.  She no longer smokes.  She feels on the last couple months that the progress and this has somewhat stalled.  Assessment & Plan: Visit Diagnoses: No diagnosis found.  Plan: Discussed the options with her.  Patient was seen also by Dr. Lajoyce Corners.  We could go forward with an office Kerecis skin grafting with application of a wound VAC.  She is interested in this as long as she is able to ambulate a bit as she is usually busier going into the fall season.  We will contact her to schedule an appointment  Follow-Up Instructions: No follow-ups on file.   Ortho Exam  Patient is alert, oriented, no adenopathy, well-dressed, normal affect, normal respiratory effort. Examination shows shows area measuring 5-1/2 cm x 2-1/2 cm x 2 mm deep.  She has biphasic pulses by Doppler.  She has fibrinous tissue at the base of the wound.  No surrounding cellulitis.  Swelling is well controlled.  Imaging: No results found. No images are attached to the encounter.  Labs: Lab Results  Component Value Date   HGBA1C 5.8 (H) 08/17/2019   ESRSEDRATE 0 08/16/2019   REPTSTATUS 08/19/2019 FINAL 08/18/2019   CULT  08/18/2019    NO GROWTH Performed at [facility], [address] [place], [place] [address]      Lab Results  Component Value Date   ALBUMIN 3.8 01/07/2021   ALBUMIN 3.7 10/05/2020   ALBUMIN 3.1 (L) 11/25/2019    Lab Results  Component Value Date   MG 2.1 09/08/2019   MG 1.4 (L) 09/07/2019    MG 1.9 09/02/2019   No results found for: VD25OH  No results found for: PREALBUMIN CBC EXTENDED Latest Ref Rng & Units 01/07/2021 11/25/2019 10/15/2019  WBC 4.0 - 10.5 K/uL 11.1(H) 8.7 7.4  RBC 3.87 - 5.11 MIL/uL 4.35 3.62(L) 3.38(L)  HGB 12.0 - 15.0 g/dL 48.5 11.2(L) 10.6(L)  HCT 36.0 - 46.0 % 42.1 34.9(L) 32.8(L)  PLT 150 - 400 K/uL 313 449(H) 535(H)  NEUTROABS 1.7 - 7.7 K/uL - - -  LYMPHSABS 0.7 - 4.0 K/uL - - -     There is no height or weight on file to calculate BMI.  Orders:  No orders of the defined types were placed in this encounter.  No orders of the defined types were placed in this encounter.    Procedures: No procedures performed  Clinical Data: No additional findings.  ROS:  All other systems negative, except as noted in the HPI. Review of Systems  Objective: Vital Signs: There were no vitals taken for this visit.  Specialty Comments:  No specialty comments available.  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 etoh induced   Coronary artery disease    80%RCA, 50%LCX, 40%LAD   Depression    DVT (deep venous thrombosis) (HCC)    right popliteal vein (08/17/19)   GERD (gastroesophageal reflux disease)    Hyperlipidemia    LV (left ventricular) mural thrombus    Stroke [facility])    / memory loss   Tobacco abuse     Family History  Problem Relation Age of Onset   Heart disease Mother     Past Surgical History:  Procedure Laterality Date   ABDOMINAL AORTOGRAM W/LOWER EXTREMITY N/A 08/19/2019   Procedure: ABDOMINAL AORTOGRAM W/LOWER EXTREMITY;  Surgeon: Nada Libman, MD;  Location: [facility] INVASIVE CV LAB;  Service: Cardiovascular;  Laterality: N/A;   ABDOMINAL AORTOGRAM W/LOWER EXTREMITY Bilateral 08/20/2019   Procedure: ABDOMINAL AORTOGRAM W/LOWER EXTREMITY;  Surgeon: Cephus Shelling, MD;  Location: [facility];  Service: Cardiovascular;  Laterality: Bilateral;   DEBRIDEMENT  FOOT Right 10/15/2019   ESOPHAGOGASTRODUODENOSCOPY N/A 08/26/2019   Procedure: ESOPHAGOGASTRODUODENOSCOPY (EGD);  Surgeon: Charlott Rakes, MD;  Location: [facility] ENDOSCOPY;  Service: Endoscopy;  Laterality: N/A;   HOT HEMOSTASIS N/A 08/26/2019   Procedure: HOT HEMOSTASIS (ARGON PLASMA COAGULATION/BICAP);  Surgeon: Charlott Rakes, MD;  Location: [facility] ENDOSCOPY;  Service: Endoscopy;  Laterality: N/A;   I & D EXTREMITY Right 10/15/2019   Procedure: RIGHT FOOT DEBRIDEMENT;  Surgeon: Nadara Mustard, MD;  Location: [facility] OR;  Service: Orthopedics;  Laterality: Right;   PERIPHERAL VASCULAR BALLOON ANGIOPLASTY  08/19/2019   Procedure: PERIPHERAL VASCULAR BALLOON ANGIOPLASTY;  Surgeon: Nada Libman, MD;  Location: [facility];  Service: Cardiovascular;;  Right femoral popliteal and PT    PERIPHERAL VASCULAR  INTERVENTION Left 08/20/2019   Procedure: PERIPHERAL VASCULAR INTERVENTION;  Surgeon: Cephus Shelling, MD;  Location: [facility];  Service: Cardiovascular;  Laterality: Left;   RIGHT/LEFT HEART CATH AND CORONARY ANGIOGRAPHY N/A 09/02/2019   Procedure: RIGHT/LEFT HEART CATH AND CORONARY ANGIOGRAPHY;  Surgeon: Laurey Morale, MD;  Location: [facility] INVASIVE CV LAB;  Service: Cardiovascular;  Laterality: N/A;   SCLEROTHERAPY  08/26/2019   Procedure: SCLEROTHERAPY;  Surgeon: Charlott Rakes, MD;  Location: [facility] ENDOSCOPY;  Service: Endoscopy;;   Social History   Occupational History   Not on file  Tobacco Use   Smoking status: Former    Years: 39.00    Types: Cigarettes  Quit date: 06/2019    Years since quitting: 2.0   Smokeless tobacco: Never  Vaping Use   Vaping Use: Never used  Substance and Sexual Activity   Alcohol use: Not Currently    Comment: alcoholism - last drink in august 2020   Drug use: Not Currently    Types: Marijuana    Comment: none since August 2020   Sexual activity: Not on file

## 2021-08-04 ENCOUNTER — Other Ambulatory Visit (HOSPITAL_COMMUNITY): Payer: Self-pay

## 2021-08-04 ENCOUNTER — Telehealth: Payer: Self-pay

## 2021-08-04 MED FILL — Aspirin Tab Delayed Release 81 MG: ORAL | 30 days supply | Qty: 30 | Fill #2 | Status: AC

## 2021-08-04 NOTE — Telephone Encounter (Signed)
Pt is to have Kerecis graft for right foot. Holding this pending follow up from [facility]  wound measure 5 1/2 cm x 2 1/2 cm 2 mm deep

## 2021-08-04 NOTE — Telephone Encounter (Signed)
Filled out paperwork for kerecis graft benefits for patient. Faxed to [phone] with demo sheet and copy of insurance card. Will hold this message pending a call to advise of approval status.

## 2021-08-05 NOTE — Telephone Encounter (Signed)
Confirmed with Margit Hanks rep that the information has been received and they will begin processing benefits. He wil advise once this has been completed.

## 2021-08-10 NOTE — Telephone Encounter (Signed)
Records request from [facility] received. Last 4 office visits, most recent ABI and lab studies. Faxed to [phone] and also emailed to the rep Tim. Will hold and continue to monitor approval process for graft.

## 2021-08-15 ENCOUNTER — Other Ambulatory Visit (HOSPITAL_COMMUNITY): Payer: Self-pay

## 2021-08-23 NOTE — Telephone Encounter (Signed)
Kerec[employer] rep to bring in sample piece of skin graft 08/24/21 will hold this message and make appt for pt once this has been delivered.

## 2021-08-24 NOTE — Telephone Encounter (Signed)
Kerecis will not be delivered until 08/25/21 will make appt at that time.

## 2021-08-31 NOTE — Telephone Encounter (Signed)
Message sent to tim with [employer]rec[employer] to ask about the donation graft. It was supposed to be brought to the office last week. Or to check the status of the pt's benefits and see if insurance will approve coverage. Will hold pending advisement.

## 2021-09-02 NOTE — Telephone Encounter (Signed)
I have 3x5 piece of epicord will hold message until Monday and discus with Dr. Lajoyce Corners when he would like to have pt come in the office for application

## 2021-09-02 NOTE — Telephone Encounter (Signed)
Message to [facility] with [employer] to see if they have a graft piece available for donation for in office application.

## 2021-09-05 ENCOUNTER — Other Ambulatory Visit (HOSPITAL_COMMUNITY): Payer: Self-pay

## 2021-09-05 MED FILL — Aspirin Tab Delayed Release 81 MG: ORAL | 30 days supply | Qty: 30 | Fill #3 | Status: AC

## 2021-09-05 NOTE — Telephone Encounter (Signed)
I called pt and advised that we have the graft and vac in office for application. She states that the next few weeks are very busy for her and she will call back to schedule an appt in [place]. Advised this would be fine just to let us know what works for her.

## 2021-09-14 ENCOUNTER — Other Ambulatory Visit (HOSPITAL_COMMUNITY): Payer: Self-pay

## 2021-10-04 ENCOUNTER — Other Ambulatory Visit (HOSPITAL_COMMUNITY): Payer: Self-pay | Admitting: Cardiology

## 2021-10-04 ENCOUNTER — Other Ambulatory Visit (HOSPITAL_COMMUNITY): Payer: Self-pay

## 2021-10-04 MED FILL — Aspirin Tab Delayed Release 81 MG: ORAL | 30 days supply | Qty: 30 | Fill #4 | Status: AC

## 2021-10-06 ENCOUNTER — Other Ambulatory Visit (HOSPITAL_COMMUNITY): Payer: Self-pay | Admitting: Cardiology

## 2021-10-06 ENCOUNTER — Other Ambulatory Visit (HOSPITAL_COMMUNITY): Payer: Self-pay

## 2021-10-06 MED FILL — Rosuvastatin Calcium Tab 5 MG: ORAL | 90 days supply | Qty: 90 | Fill #0 | Status: AC

## 2021-10-07 ENCOUNTER — Other Ambulatory Visit (HOSPITAL_COMMUNITY): Payer: Self-pay

## 2021-10-27 ENCOUNTER — Telehealth: Payer: Self-pay | Admitting: Orthopedic Surgery

## 2021-10-27 NOTE — Telephone Encounter (Signed)
Kerecis rep Mariah called to speak with Autumn. Would like a call back. [phone] °

## 2021-10-27 NOTE — Telephone Encounter (Signed)
I called and sw rep and she states that they are still working an [place] for the pt for graft and that she had resubmit everything in October and still have not heard back. Has escalated the request and wanted to make sure that we still wish to proceed. Advised that would be great that the pt had advised several months ago that this is a busy season for her and that she was not in any rush and that she would like to proceed after the holidays. Dow Adolph will still continue with auth and call with any questions.

## 2021-11-09 ENCOUNTER — Other Ambulatory Visit (HOSPITAL_COMMUNITY): Payer: Self-pay | Admitting: Cardiology

## 2021-11-09 ENCOUNTER — Other Ambulatory Visit (HOSPITAL_COMMUNITY): Payer: Self-pay

## 2021-11-09 ENCOUNTER — Telehealth: Payer: Self-pay | Admitting: Orthopedic Surgery

## 2021-11-09 MED FILL — Aspirin Tab Delayed Release 81 MG: ORAL | 30 days supply | Qty: 30 | Fill #5 | Status: AC

## 2021-11-09 NOTE — Telephone Encounter (Signed)
Kerecis rep Dow Adolph called to speak with Autumn. Would like a call back. [phone]

## 2021-11-10 ENCOUNTER — Other Ambulatory Visit (HOSPITAL_COMMUNITY): Payer: Self-pay

## 2021-11-10 MED ORDER — ENTRESTO 49-51 MG PO TABS
1.0000 | ORAL_TABLET | Freq: Two times a day (BID) | ORAL | 3 refills | Status: DC
  Filled 2021-11-10: qty 60, 30d supply, fill #0

## 2021-11-11 ENCOUNTER — Other Ambulatory Visit (HOSPITAL_COMMUNITY): Payer: Self-pay

## 2021-11-11 NOTE — Telephone Encounter (Signed)
Can you call pt to sch appt with Dr. Lajoyce Corners at some point in the next few weeks please?   Sw [place] with [employer] called and asked what stage the ulcer was in and advised that we have not seen thispt in the office since 07/27/21 and with that lets put the authorization on hold and will reassess the pt in the office.

## 2021-11-14 ENCOUNTER — Other Ambulatory Visit: Payer: Self-pay

## 2021-11-14 NOTE — Telephone Encounter (Signed)
Cover my meds and [facility] is requesting a prior auth on Entresto 49-51mg  tablets. Please address

## 2021-11-15 ENCOUNTER — Other Ambulatory Visit (HOSPITAL_COMMUNITY): Payer: Self-pay

## 2021-11-15 NOTE — Telephone Encounter (Signed)
Im not sure why they sent this, there is no pa required and it was sent from [facility] via mail. We should be fine to move on

## 2021-11-15 NOTE — Telephone Encounter (Signed)
See below

## 2021-11-17 ENCOUNTER — Other Ambulatory Visit (HOSPITAL_COMMUNITY): Payer: Self-pay

## 2021-11-17 MED ORDER — ENTRESTO 49-51 MG PO TABS
1.0000 | ORAL_TABLET | Freq: Two times a day (BID) | ORAL | 3 refills | Status: DC
  Filled 2021-11-17 – 2021-12-15 (×2): qty 60, 30d supply, fill #0
  Filled 2022-01-16: qty 60, 30d supply, fill #1
  Filled 2022-02-14: qty 60, 30d supply, fill #2

## 2021-12-15 ENCOUNTER — Other Ambulatory Visit (HOSPITAL_COMMUNITY): Payer: Self-pay

## 2021-12-15 MED FILL — Aspirin Tab Delayed Release 81 MG: ORAL | 30 days supply | Qty: 30 | Fill #6 | Status: AC

## 2021-12-21 ENCOUNTER — Other Ambulatory Visit (HOSPITAL_COMMUNITY): Payer: Self-pay

## 2021-12-21 ENCOUNTER — Other Ambulatory Visit (HOSPITAL_COMMUNITY): Payer: Self-pay | Admitting: Cardiology

## 2021-12-21 MED ORDER — SPIRONOLACTONE 25 MG PO TABS
25.0000 mg | ORAL_TABLET | Freq: Every day | ORAL | 0 refills | Status: DC
  Filled 2021-12-21: qty 30, 30d supply, fill #0

## 2022-01-04 ENCOUNTER — Ambulatory Visit (INDEPENDENT_AMBULATORY_CARE_PROVIDER_SITE_OTHER): Payer: 59 | Admitting: Family

## 2022-01-04 ENCOUNTER — Encounter: Payer: Self-pay | Admitting: Family

## 2022-01-04 ENCOUNTER — Other Ambulatory Visit: Payer: Self-pay

## 2022-01-04 DIAGNOSIS — L98499 Non-pressure chronic ulcer of skin of other sites with unspecified severity: Secondary | ICD-10-CM

## 2022-01-04 DIAGNOSIS — L89892 Pressure ulcer of other site, stage 2: Secondary | ICD-10-CM | POA: Diagnosis not present

## 2022-01-04 NOTE — Progress Notes (Signed)
Office Visit Note   Patient: Briana Andrews           Date of Birth: Feb 17, 1966           MRN: [id number] Visit Date: 01/04/2022              Requested by: No referring provider defined for this encounter. PCP: Patient, [name] (Inactive)  Chief Complaint  Patient presents with   Right Foot - Follow-up      HPI: The patient is a 56 year old woman seen status post skin grafting of right dorsal foot ulcer. Last office visit as 8/22. Has been doing well. Has resumed swimming. Has been using medical compression stocking or bandaids daily. Has noticed a retained staple. Would like harvested.  Feels healing has stalled. Would like to hold off on another graft placement.    Assessment & Plan: Visit Diagnoses: No diagnosis found.  Plan: given prisma to apply daily. Follow up in office in 6 weeks. Will continue with her wound care.  Follow-Up Instructions: Return in about 6 weeks (around 02/15/2022).   Ortho Exam  Patient is alert, oriented, no adenopathy, well-dressed, normal affect, normal respiratory effort. Plain Examination of the right foot she does have a dopplerable pulse.  There is a wound over the dorsal foot near the ankle this is 5 cm by8 mm and 2 mm deep this is filled in with 100% red flat pink tissue.  There is no surrounding erythema or warmth no edema no drainage  Imaging: No results found. No images are attached to the encounter.  Labs: Lab Results  Component Value Date   HGBA1C 5.8 (H) 08/17/2019   ESRSEDRATE 0 08/16/2019   REPTSTATUS 08/19/2019 FINAL 08/18/2019   CULT  08/18/2019    NO GROWTH Performed at [facility], [address], [place], [place] [address]      Lab Results  Component Value Date   ALBUMIN 3.8 01/07/2021   ALBUMIN 3.7 10/05/2020   ALBUMIN 3.1 (L) 11/25/2019    Lab Results  Component Value Date   MG 2.1 09/08/2019   MG 1.4 (L) 09/07/2019   MG 1.9 09/02/2019   No results found for: VD25OH  No results found  for: PREALBUMIN CBC EXTENDED Latest Ref Rng & Units 01/07/2021 11/25/2019 10/15/2019  WBC 4.0 - 10.5 K/uL 11.1(H) 8.7 7.4  RBC 3.87 - 5.11 MIL/uL 4.35 3.62(L) 3.38(L)  HGB 12.0 - 15.0 g/dL 29.7 11.2(L) 10.6(L)  HCT 36.0 - 46.0 % 42.1 34.9(L) 32.8(L)  PLT 150 - 400 K/uL 313 449(H) 535(H)  NEUTROABS 1.7 - 7.7 K/uL - - -  LYMPHSABS 0.7 - 4.0 K/uL - - -     There is no height or weight on file to calculate BMI.  Orders:  No orders of the defined types were placed in this encounter.  No orders of the defined types were placed in this encounter.    Procedures: No procedures performed  Clinical Data: No additional findings.  ROS:  All other systems negative, except as noted in the HPI. Review of Systems  Objective: Vital Signs: There were no vitals taken for this visit.  Specialty Comments:  No specialty comments available.  PMFS History: Patient Active Problem List   Diagnosis Date Noted   Gangrene of right foot (HCC)    Cutaneous abscess of right foot    Endotracheally intubated    Acute on chronic systolic CHF (congestive heart failure) (HCC)    GI bleed 08/26/2019  Pressure injury of skin 08/23/2019   Acute upper GI bleed    Shock circulatory (HCC)    Palliative care by specialist    Goals of care, counseling/discussion    Advanced care planning/counseling discussion    Jaundice    Cerebral embolism with cerebral infarction 08/17/2019   PVD (peripheral vascular disease) (HCC) 08/17/2019   Left ventricular apical thrombus without MI (HCC) 08/17/2019   Acute CHF (congestive heart failure) (HCC) 08/17/2019   Abnormal LFTs 08/16/2019   Acute metabolic encephalopathy 08/16/2019   SOB (shortness of breath) 08/16/2019   AKI (acute kidney injury) (HCC) 08/16/2019   Fall 08/16/2019   Lactic acidosis 08/16/2019   Back pain 08/16/2019   Liver failure without hepatic coma (HCC) 08/16/2019   Multifocal pneumonia 08/16/2019   Alcohol abuse    Tobacco abuse     Shortness of breath    Past Medical History:  Diagnosis Date   Alcohol abuse    CHF (congestive heart failure) (HCC)    last EF 35-40%. etoh induced   Coronary artery disease    80%RCA, 50%LCX, 40%LAD   Depression    DVT (deep venous thrombosis) (HCC)    right popliteal vein (08/17/19)   GERD (gastroesophageal reflux disease)    Hyperlipidemia    LV (left ventricular) mural thrombus    Stroke [place])    / memory loss   Tobacco abuse     Family History  Problem Relation Age of Onset   Heart disease Mother     Past Surgical History:  Procedure Laterality Date   ABDOMINAL AORTOGRAM W/LOWER EXTREMITY N/A 08/19/2019   Procedure: ABDOMINAL AORTOGRAM W/LOWER EXTREMITY;  Surgeon: Nada Libman, MD;  Location: [facility];  Service: Cardiovascular;  Laterality: N/A;   ABDOMINAL AORTOGRAM W/LOWER EXTREMITY Bilateral 08/20/2019   Procedure: ABDOMINAL AORTOGRAM W/LOWER EXTREMITY;  Surgeon: Cephus Shelling, MD;  Location: [facility];  Service: Cardiovascular;  Laterality: Bilateral;   DEBRIDEMENT  FOOT Right 10/15/2019   ESOPHAGOGASTRODUODENOSCOPY N/A 08/26/2019   Procedure: ESOPHAGOGASTRODUODENOSCOPY (EGD);  Surgeon: Charlott Rakes, MD;  Location: [facility] ENDOSCOPY;  Service: Endoscopy;  Laterality: N/A;   HOT HEMOSTASIS N/A 08/26/2019   Procedure: HOT HEMOSTASIS (ARGON PLASMA COAGULATION/BICAP);  Surgeon: Charlott Rakes, MD;  Location: [facility] ENDOSCOPY;  Service: Endoscopy;  Laterality: N/A;   I & D EXTREMITY Right 10/15/2019   Procedure: RIGHT FOOT DEBRIDEMENT;  Surgeon: Nadara Mustard, MD;  Location: [facility] OR;  Service: Orthopedics;  Laterality: Right;   PERIPHERAL VASCULAR BALLOON ANGIOPLASTY  08/19/2019   Procedure: PERIPHERAL VASCULAR BALLOON ANGIOPLASTY;  Surgeon: Nada Libman, MD;  Location: [facility];  Service: Cardiovascular;;  Right femoral popliteal and PT    PERIPHERAL VASCULAR INTERVENTION Left 08/20/2019   Procedure: PERIPHERAL VASCULAR INTERVENTION;   Surgeon: Cephus Shelling, MD;  Location: [facility];  Service: Cardiovascular;  Laterality: Left;   RIGHT/LEFT HEART CATH AND CORONARY ANGIOGRAPHY N/A 09/02/2019   Procedure: RIGHT/LEFT HEART CATH AND CORONARY ANGIOGRAPHY;  Surgeon: Laurey Morale, MD;  [facility] INVASIVE CV LAB;  Service: Cardiovascular;  Laterality: N/A;   SCLEROTHERAPY  08/26/2019   Procedure: SCLEROTHERAPY;  Surgeon: Charlott Rakes, MD;  Location: [facility] ENDOSCOPY;  Service: Endoscopy;;   Social History   Occupational History   Not on file  Tobacco Use   Smoking status: Former    Years: 39.00    Types: Cigarettes    Quit date: 06/2019    Years since quitting: 2.5   Smokeless tobacco: Never  Vaping Use   Vaping Use: Never used  Substance and Sexual Activity   Alcohol use: Not Currently    Comment: alcoholism - last drink in august 2020   Drug use: Not Currently    Types: Marijuana    Comment: none since August 2020   Sexual activity: Not on file

## 2022-01-06 ENCOUNTER — Other Ambulatory Visit (HOSPITAL_COMMUNITY): Payer: Self-pay | Admitting: Cardiology

## 2022-01-06 ENCOUNTER — Other Ambulatory Visit (HOSPITAL_COMMUNITY): Payer: Self-pay

## 2022-01-06 MED ORDER — ROSUVASTATIN CALCIUM 5 MG PO TABS
5.0000 mg | ORAL_TABLET | Freq: Every day | ORAL | 3 refills | Status: DC
  Filled 2022-01-06: qty 90, 90d supply, fill #0

## 2022-01-16 ENCOUNTER — Other Ambulatory Visit (HOSPITAL_COMMUNITY): Payer: Self-pay

## 2022-01-16 ENCOUNTER — Other Ambulatory Visit (HOSPITAL_COMMUNITY): Payer: Self-pay | Admitting: Cardiology

## 2022-01-17 ENCOUNTER — Other Ambulatory Visit (HOSPITAL_COMMUNITY): Payer: Self-pay

## 2022-01-17 MED ORDER — ASPIRIN 81 MG PO TBEC
81.0000 mg | DELAYED_RELEASE_TABLET | Freq: Every day | ORAL | 0 refills | Status: DC
  Filled 2022-01-17: qty 15, 15d supply, fill #0

## 2022-01-17 MED ORDER — SPIRONOLACTONE 25 MG PO TABS
25.0000 mg | ORAL_TABLET | Freq: Every day | ORAL | 0 refills | Status: DC
  Filled 2022-01-17: qty 15, 15d supply, fill #0

## 2022-02-14 ENCOUNTER — Other Ambulatory Visit (HOSPITAL_COMMUNITY): Payer: Self-pay | Admitting: Cardiology

## 2022-02-14 ENCOUNTER — Other Ambulatory Visit (HOSPITAL_COMMUNITY): Payer: Self-pay

## 2022-02-15 ENCOUNTER — Other Ambulatory Visit (HOSPITAL_COMMUNITY): Payer: Self-pay

## 2022-02-15 MED ORDER — ASPIRIN 81 MG PO TBEC
81.0000 mg | DELAYED_RELEASE_TABLET | Freq: Every day | ORAL | 0 refills | Status: AC
Start: 2022-02-15 — End: ?
  Filled 2022-02-15: qty 15, 15d supply, fill #0

## 2022-03-14 NOTE — Progress Notes (Signed)
?PCP: None  ?Vascular : Dr Darrick Penna  ?Ortho: Dr Lajoyce Corners  ?[facility]  ?HF Cardiologist: Dr Shirlee Latch ? ?HPI: ?Briana Andrews is a 56 y.o. alcoholic woman with a history of R frontal CVA, B popliteal artery occlusions, hepatic failure, GI bleeding, cirrhosis, ETOH abuse, and chronic systolic heart failure. ? ?Admitted 08/15/19 with increased sob and confusion. Treated for PNA and MRI brain showed R ACA stroke. Hospital course complicated new onset acute systolic heart failure possibly due to ETOH abuse. Hospital course complicated by LV thrombus, hepatitis, ARF, lower extremity cardioembolism, and shock.  She had suspected cardioembolism to bilateral popliteal arteries and underwent mechanical thrombectomy.  Received PRBCs and was able to undergo EGD 9/29 that showed duodenal and gastric ulcers which were treated endoscopically. Continued on PPI.  Heparin has been converted to coumadin. R foot wound was to be followed by Vascular in the community  Discharged to SNF. On 09/21/19 she presented to the [facility] after a fall that resulted in head laceration. CT scan was unremarkable. She left SNF on 09/27/19 because she was not taken to any of her follow up appointments.   ? ?She has had follow up with VVS/Ortho for R foot wound and had debridement 10/14/19.  ? ?Echo in 3/21 showed EF increased to 45-50%, mild LV dilation, normal RV.  ? ?Echo 2/22 EF up to 55-60% with normal RV.  ? ?Today she returns for HF follow up. Last seen 2/22. Overall feeling fine. Likes to garden and has no significant dyspnea with this. Denies palpitations, abnormal bleeding, CP, dizziness, edema, or PND/Orthopnea. Appetite ok. No fever or chills. Weight at home 174 pounds. Notices that weight has been going up, eating a lot of ice cream. She ran out of spiro a few weeks ago. Son helps with pillbox. Remains quit from tobacco 06/2019, minimal ETOH use now.  ? ?ECG (personally reviewed): SB 49 bpm ? ?Labs (11/20): K 4.2, creatinine 0.97 ?Labs (12/20): LDL  55, TGs 259 ?Labs (3/21): K 4.3, 0.99 ?Labs (5/21): LDL 71, HDL 38, TGs 134 ?Labs (6/21): K 4.8, creatinine 1.05 ?Labs (11/21): LDL 74, HDL 30 ?Labs (12/21): K 4.8, creatinine 1.13 ?Labs (2/22): K 4.9, creatinine 1.17 ? ?1. Chronic systolic CHF: Primarily nonischemic cardiomyopathy.   ?- Echo (9/20): EF 15-20% with LV apical thrombus. Severely decreased RV function.  ?- RHC (10/20): RA 6, CI 3.7, PVR 3.7  ?- LHC (10/20): LAD 40%, Left Circumflex 50% , 80% proximal RCA, 60% mid PDA stenosis ?- Echo (3/21): EF increased to 45-50%, mild LV dilation, normal RV.  ?- Echo (2/22): EF 55-60%, normal RV.  ?2. GI Bleed: Gastric and duodenal ulcers in 9/20.  ?3. R frontal CVA: In 9/20, due to LV thrombus.  ?4. 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   ?- ABIs (11/20): Normal.  ?5. 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  Liver imaging did not show cirrhosis or portal vein thrombosis. RV failure/congestive hepatopathy may have played a role.  ?6. ETOH Abuse  ?7. LV thrombus ?8. CAD: LHC 10/20 LAD 40%, Left Circumflex 50% , RCA 80% Proximal RCA 60% mid PDA stenosis.  ?9. DVT: Right popliteal vein in 9/20.  ? ?ROS: All systems negative except as listed in HPI, PMH and Problem List. ? ?SH:  ?Social History  ? ?Socioeconomic History  ? Marital status: Married  ?  Spouse name: Not on file  ? Number of children: Not on file  ? Years of education: Not on file  ? Highest  education level: Not on file  ?Occupational History  ? Not on file  ?Tobacco Use  ? Smoking status: Former  ?  Years: 39.00  ?  Types: Cigarettes  ?  Quit date: 06/2019  ?  Years since quitting: 2.7  ? Smokeless tobacco: Never  ?Vaping Use  ? Vaping Use: Never used  ?Substance and Sexual Activity  ? Alcohol use: Not Currently  ?  Comment: alcoholism - last drink in august 2020  ? Drug use: Not Currently  ?  Types: Marijuana  ?  Comment: none since August 2020  ? Sexual activity: Not on file  ?Other Topics Concern  ? Not on  file  ?Social History Narrative  ? Not on file  ? ?Social Determinants of Health  ? ?Financial Resource Strain: Not on file  ?Food Insecurity: Not on file  ?Transportation Needs: Not on file  ?Physical Activity: Not on file  ?Stress: Not on file  ?Social Connections: Not on file  ?Intimate Partner Violence: Not on file  ? ? ?FH:  ?Family History  ?Problem Relation Age of Onset  ? Heart disease Mother   ? ? ?Current Outpatient Medications  ?Medication Sig Dispense Refill  ? aspirin 81 MG EC tablet Take 1 tablet by mouth daily. NEEDS APPOINTMENT FOR ANYMORE REFILLS 15 tablet 0  ? carvedilol (COREG) 6.25 MG tablet TAKE 1 TABLET BY MOUTH TWO TIMES DAILY WITH MEALS 180 tablet 3  ? folic acid (FOLVITE) 1 MG tablet TAKE 1 TABLET BY MOUTH ONCE A DAY 90 tablet 3  ? furosemide (LASIX) 20 MG tablet Take 1 tablet (20 mg total) by mouth daily as needed (fluid retention.). 30 tablet 11  ? ibuprofen (ADVIL) 200 MG tablet Take 200 mg by mouth every 6 (six) hours as needed for headache or moderate pain.    ? rosuvastatin (CRESTOR) 5 MG tablet Take 1 tablet (5 mg total) by mouth daily. 90 tablet 3  ? sacubitril-valsartan (ENTRESTO) 49-51 MG Take 1 tablet by mouth 2 (two) times daily. Needs appt 60 tablet 3  ? icosapent Ethyl (VASCEPA) 1 g capsule Take 2 capsules (2 g total) by mouth 2 (two) times daily. (Patient not taking: Reported on 03/17/2022) 120 capsule 5  ? spironolactone (ALDACTONE) 25 MG tablet Take 1 tablet by mouth daily. NEEDS APPOINTMENT FOR FURTHER REFILLS (Patient not taking: Reported on 03/17/2022) 15 tablet 0  ? ?No current facility-administered medications for this encounter.  ? ? ?BP 130/80   Pulse 65   Wt 78.8 kg (173 lb 12.8 oz)   SpO2 100%   BMI 29.83 kg/m?  ? ?Wt Readings from Last 3 Encounters:  ?03/17/22 78.8 kg (173 lb 12.8 oz)  ?01/07/21 73.4 kg (161 lb 12.8 oz)  ?10/05/20 71.9 kg (158 lb 9.6 oz)  ? ?PHYSICAL EXAM: ?General:  NAD. No resp difficulty ?HEENT: Normal ?Neck: Supple. No JVD. Carotids 2+ bilat;  no bruits. No lymphadenopathy or thryomegaly appreciated. ?Cor: PMI nondisplaced. Regular rate & rhythm. No rubs, gallops or murmurs. ?Lungs: Clear ?Abdomen: Soft, nontender, nondistended. No hepatosplenomegaly. No bruits or masses. Good bowel sounds. ?Extremities: No cyanosis, clubbing, rash, edema ?Neuro: Alert & oriented x 3, cranial nerves grossly intact. Moves all 4 extremities w/o difficulty. Affect pleasant. ? ?ASSESSMENT & PLAN: ?1. Chronic systolic CHF:  Cardiogenic shock in 9/20, echo 07/2019 with EF 15-20%, mild LV dilation, LV thrombus, mild-moderate MR, severely decreased RV systolic function.  LHC/RHC done 10/20 showing normal filling pressures and preserved cardiac output.  There was diffuse  coronary disease, especially involving distal and branch vessels, only possible interventional target was 80% proximal RCA stenosis.  No intervention given good flow down the vessel and unlikely to significantly improve LV function.  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 Echo in 3/21 showed EF up to 45-50% and echo 2/22 showed EF up to 55-60%.  NYHA class 1, not volume overloaded on exam.  ?- Restart spiro 25 mg daily. BMET today, repeat in 1 week (OK to get at [facility]). ?- Continue Coreg 6.25 mg bid.   ?- Continue Entresto 49/51 bid. BMET today.  ?- Continue to abstain from ETOH.  ?- Update echo. ?2. Suspected ETOH hepatitis:  Elevated LFTs during 9/20 admission.  However liver imaging with [place] and CT has NOT shown cirrhosis or portal/hepatic vein thrombosis. Question has arisen whether this might possibly be due primarily to RV failure.  ?- Check LFTs today.  ?3.  LV thrombus: Possible cause of embolic event to popliteal arteries as well as subacute CVA.  ?- Warfarin has been stopped with EF back to normal range,  ?- Continue ASA 81 daily.  ?4. PAD: Bilateral popliteal artery occlusions, suspect cardioembolic from LV thrombus.  Now s/p  mechanical thrombectomy by vascular.  Right foot wound followed by Dr. Lajoyce Corners, has had skin graft and now mostly healed.   ?5. CVA: Subacute right ACA CVA in 9/20. Likely from LV thrombus.   ?- Off warfarin and

## 2022-03-17 ENCOUNTER — Other Ambulatory Visit (HOSPITAL_COMMUNITY): Payer: Self-pay

## 2022-03-17 ENCOUNTER — Encounter (HOSPITAL_COMMUNITY): Payer: Self-pay

## 2022-03-17 ENCOUNTER — Ambulatory Visit (HOSPITAL_COMMUNITY)
Admission: RE | Admit: 2022-03-17 | Discharge: 2022-03-17 | Disposition: A | Discharge status: Home / Self Care | Payer: 59 | Source: Ambulatory Visit | Attending: Family Medicine | Admitting: Family Medicine

## 2022-03-17 VITALS — BP 130/80 | HR 65 | Wt 173.8 lb

## 2022-03-17 DIAGNOSIS — I240 Acute coronary thrombosis not resulting in myocardial infarction: Secondary | ICD-10-CM

## 2022-03-17 DIAGNOSIS — I2510 Atherosclerotic heart disease of native coronary artery without angina pectoris: Secondary | ICD-10-CM | POA: Diagnosis not present

## 2022-03-17 DIAGNOSIS — Z79899 Other long term (current) drug therapy: Secondary | ICD-10-CM | POA: Insufficient documentation

## 2022-03-17 DIAGNOSIS — Z86718 Personal history of other venous thrombosis and embolism: Secondary | ICD-10-CM

## 2022-03-17 DIAGNOSIS — I739 Peripheral vascular disease, unspecified: Secondary | ICD-10-CM

## 2022-03-17 DIAGNOSIS — I428 Other cardiomyopathies: Secondary | ICD-10-CM | POA: Insufficient documentation

## 2022-03-17 DIAGNOSIS — Z8719 Personal history of other diseases of the digestive system: Secondary | ICD-10-CM

## 2022-03-17 DIAGNOSIS — Z7982 Long term (current) use of aspirin: Secondary | ICD-10-CM | POA: Diagnosis not present

## 2022-03-17 DIAGNOSIS — Z8673 Personal history of transient ischemic attack (TIA), and cerebral infarction without residual deficits: Secondary | ICD-10-CM | POA: Diagnosis not present

## 2022-03-17 DIAGNOSIS — F1011 Alcohol abuse, in remission: Secondary | ICD-10-CM

## 2022-03-17 DIAGNOSIS — I5022 Chronic systolic (congestive) heart failure: Secondary | ICD-10-CM

## 2022-03-17 DIAGNOSIS — K759 Inflammatory liver disease, unspecified: Secondary | ICD-10-CM | POA: Diagnosis not present

## 2022-03-17 LAB — LIPID PANEL
Cholesterol: 138 mg/dL (ref 0–200)
HDL: 34 mg/dL — ABNORMAL LOW (ref >40)
LDL Cholesterol: 83 mg/dL (ref 0–99)
Total CHOL/HDL Ratio: 4.1 RATIO
Triglycerides: 107 mg/dL (ref <150)
VLDL: 21 mg/dL (ref 0–40)

## 2022-03-17 LAB — COMPREHENSIVE METABOLIC PANEL
ALT: 15 U/L (ref 0–44)
AST: 20 U/L (ref 15–41)
Albumin: 3.9 g/dL (ref 3.5–5.0)
Alkaline Phosphatase: 94 U/L (ref 38–126)
Anion gap: 6 (ref 5–15)
BUN: 18 mg/dL (ref 6–20)
CO2: 24 mmol/L (ref 22–32)
Calcium: 9.4 mg/dL (ref 8.9–10.3)
Chloride: 106 mmol/L (ref 98–111)
Creatinine, Ser: 1.21 mg/dL — ABNORMAL HIGH (ref 0.44–1.00)
GFR, Estimated: 53 mL/min — ABNORMAL LOW (ref >60)
Glucose, Bld: 114 mg/dL — ABNORMAL HIGH (ref 70–99)
Potassium: 4.7 mmol/L (ref 3.5–5.1)
Sodium: 136 mmol/L (ref 135–145)
Total Bilirubin: 0.9 mg/dL (ref 0.3–1.2)
Total Protein: 8 g/dL (ref 6.5–8.1)

## 2022-03-17 LAB — BRAIN NATRIURETIC PEPTIDE: B Natriuretic Peptide: 111.9 pg/mL — ABNORMAL HIGH (ref 0.0–100.0)

## 2022-03-17 MED ORDER — CARVEDILOL 6.25 MG PO TABS
ORAL_TABLET | Freq: Two times a day (BID) | ORAL | 3 refills | Status: DC
  Filled 2022-03-17: qty 180, fill #0
  Filled 2022-05-24: qty 180, 90d supply, fill #0
  Filled 2022-08-30: qty 180, 90d supply, fill #1
  Filled 2022-12-13: qty 180, 90d supply, fill #2
  Filled 2023-03-05 (×2): qty 180, 90d supply, fill #3

## 2022-03-17 MED ORDER — ENTRESTO 49-51 MG PO TABS
1.0000 | ORAL_TABLET | Freq: Two times a day (BID) | ORAL | 11 refills | Status: DC
  Filled 2022-03-17: qty 60, 30d supply, fill #0
  Filled 2022-04-17: qty 60, 30d supply, fill #1
  Filled 2022-05-24: qty 60, 30d supply, fill #2
  Filled 2022-06-28: qty 60, 30d supply, fill #3
  Filled 2022-07-27: qty 60, 30d supply, fill #4
  Filled 2022-08-30: qty 60, 30d supply, fill #5
  Filled 2022-10-06: qty 60, 30d supply, fill #6
  Filled 2022-11-03: qty 60, 30d supply, fill #7
  Filled 2022-12-06 (×5): qty 60, 30d supply, fill #8
  Filled 2023-01-03: qty 60, 30d supply, fill #9
  Filled 2023-02-08: qty 60, 30d supply, fill #10
  Filled 2023-03-05 (×2): qty 60, 30d supply, fill #11

## 2022-03-17 MED ORDER — ROSUVASTATIN CALCIUM 5 MG PO TABS
5.0000 mg | ORAL_TABLET | Freq: Every day | ORAL | 3 refills | Status: DC
  Filled 2022-03-17: qty 90, 90d supply, fill #0
  Filled 2022-07-27: qty 90, 90d supply, fill #1
  Filled 2022-11-03: qty 90, 90d supply, fill #2
  Filled 2023-01-31: qty 90, 90d supply, fill #3

## 2022-03-17 MED ORDER — SPIRONOLACTONE 25 MG PO TABS
25.0000 mg | ORAL_TABLET | Freq: Every day | ORAL | 11 refills | Status: DC
  Filled 2022-03-17: qty 30, 30d supply, fill #0
  Filled 2022-04-17: qty 30, 30d supply, fill #1
  Filled 2022-05-24: qty 30, 30d supply, fill #2
  Filled 2022-06-28: qty 30, 30d supply, fill #3
  Filled 2022-07-27: qty 30, 30d supply, fill #4
  Filled 2022-08-30: qty 30, 30d supply, fill #5
  Filled 2022-10-06: qty 30, 30d supply, fill #6
  Filled 2022-11-03: qty 30, 30d supply, fill #7
  Filled 2022-12-06: qty 30, 30d supply, fill #8
  Filled 2023-01-03: qty 30, 30d supply, fill #9
  Filled 2023-01-31: qty 30, 30d supply, fill #10
  Filled 2023-03-05 (×2): qty 30, 30d supply, fill #11

## 2022-03-17 MED ORDER — FUROSEMIDE 20 MG PO TABS
20.0000 mg | ORAL_TABLET | Freq: Every day | ORAL | 11 refills | Status: DC | PRN
  Filled 2022-03-17: qty 30, 30d supply, fill #0
  Filled 2022-04-17: qty 30, 30d supply, fill #1
  Filled 2022-05-24: qty 30, 30d supply, fill #2
  Filled 2022-06-28: qty 30, 30d supply, fill #3
  Filled 2022-07-27: qty 30, 30d supply, fill #4
  Filled 2022-09-01: qty 30, 30d supply, fill #5
  Filled 2022-10-06: qty 30, 30d supply, fill #6
  Filled 2022-11-03: qty 30, 30d supply, fill #7
  Filled 2022-12-06: qty 30, 30d supply, fill #8
  Filled 2023-01-03: qty 30, 30d supply, fill #9
  Filled 2023-01-31: qty 30, 30d supply, fill #10
  Filled 2023-03-05 (×2): qty 30, 30d supply, fill #11

## 2022-03-17 MED ORDER — ICOSAPENT ETHYL 1 G PO CAPS
2.0000 g | ORAL_CAPSULE | Freq: Two times a day (BID) | ORAL | 11 refills | Status: DC
  Filled 2022-03-17: qty 120, 30d supply, fill #0
  Filled 2022-07-17: qty 120, 30d supply, fill #1
  Filled 2022-11-22: qty 120, 30d supply, fill #2
  Filled 2023-01-18: qty 120, 30d supply, fill #3
  Filled 2023-03-07: qty 120, 30d supply, fill #4

## 2022-03-17 NOTE — Patient Instructions (Signed)
EKG done today. ? ?Labs done today. We will contact you only if your labs are abnormal. ? ?RESTART Spironolactone 25mg  (1 tablet) by mouth daily.  ? ?Your medications have been refilled. ? ?No other medication changes were made. Please continue all current medications as prescribed. ? ?Your physician recommends that you schedule a follow-up appointment in: 1 week for a lab only appointment(can go to [facility] medical mall) and in 6 months with Dr. [facility] with an echo prior to your appointment.  ? ?Your physician has requested that you have an echocardiogram. Echocardiography is a painless test that uses sound waves to create images of your heart. It provides your doctor with information about the size and shape of your heart and how well your heart?s chambers and valves are working. This procedure takes approximately one hour. There are no restrictions for this procedure. ? ?If you have any questions or concerns before your next appointment please send Shirlee Latch a message through [place] or call our office at [phone].   ? ?TO LEAVE A MESSAGE FOR THE NURSE SELECT OPTION 2, PLEASE LEAVE A MESSAGE INCLUDING: ?YOUR NAME ?DATE OF BIRTH ?CALL BACK NUMBER ?REASON FOR CALL**this is important as we prioritize the call backs ? ?YOU WILL RECEIVE A CALL BACK THE SAME DAY AS LONG AS YOU CALL BEFORE 4:00 PM ? ? ?Do the following things EVERYDAY: ?Weigh yourself in the morning before breakfast. Write it down and keep it in a log. ?Take your medicines as prescribed ?Eat low salt foods--Limit salt (sodium) to 2000 mg per day.  ?Stay as active as you can everyday ?Limit all fluids for the day to less than 2 liters ? ? ?At the [facility], you and your health needs are our priority. As part of our continuing mission to provide you with exceptional heart care, we have created designated Provider Care Teams. These Care Teams include your primary Cardiologist (physician) and Advanced Practice Providers (APPs- Physician  Assistants and Nurse Practitioners) who all work together to provide you with the care you need, when you need it.  ? ?You may see any of the following providers on your designated Care Team at your next follow up: ?Dr [phone] ?Dr Arvilla Meres ?Marca Ancona, NP ?Tonye Becket, PA ?Robbie Lis, PharmD ? ? ?Please be sure to bring in all your medications bottles to every appointment.  ? ?

## 2022-03-20 ENCOUNTER — Other Ambulatory Visit (HOSPITAL_COMMUNITY): Payer: Self-pay

## 2022-04-17 ENCOUNTER — Other Ambulatory Visit (HOSPITAL_COMMUNITY): Payer: Self-pay

## 2022-04-17 ENCOUNTER — Other Ambulatory Visit (HOSPITAL_COMMUNITY): Payer: Self-pay | Admitting: Cardiology

## 2022-04-17 MED ORDER — FOLIC ACID 1 MG PO TABS
ORAL_TABLET | Freq: Every day | ORAL | 3 refills | Status: AC
  Filled 2022-04-17: qty 90, 90d supply, fill #0
  Filled 2022-07-27: qty 90, 90d supply, fill #1
  Filled 2022-11-03: qty 90, 90d supply, fill #2
  Filled 2023-01-31: qty 90, 90d supply, fill #3

## 2022-05-24 ENCOUNTER — Other Ambulatory Visit (HOSPITAL_COMMUNITY): Payer: Self-pay

## 2022-06-28 ENCOUNTER — Other Ambulatory Visit (HOSPITAL_COMMUNITY): Payer: Self-pay

## 2022-07-17 ENCOUNTER — Other Ambulatory Visit (HOSPITAL_COMMUNITY): Payer: Self-pay

## 2022-07-18 ENCOUNTER — Other Ambulatory Visit (HOSPITAL_COMMUNITY): Payer: Self-pay

## 2022-07-27 ENCOUNTER — Other Ambulatory Visit (HOSPITAL_COMMUNITY): Payer: Self-pay

## 2022-07-28 ENCOUNTER — Other Ambulatory Visit (HOSPITAL_COMMUNITY): Payer: Self-pay

## 2022-08-04 ENCOUNTER — Other Ambulatory Visit (HOSPITAL_COMMUNITY): Payer: Self-pay

## 2022-08-30 ENCOUNTER — Other Ambulatory Visit (HOSPITAL_COMMUNITY): Payer: Self-pay

## 2022-08-31 ENCOUNTER — Other Ambulatory Visit (HOSPITAL_COMMUNITY): Payer: Self-pay

## 2022-09-01 ENCOUNTER — Other Ambulatory Visit (HOSPITAL_COMMUNITY): Payer: Self-pay

## 2022-10-06 ENCOUNTER — Other Ambulatory Visit (HOSPITAL_COMMUNITY): Payer: Self-pay

## 2022-11-03 ENCOUNTER — Other Ambulatory Visit (HOSPITAL_COMMUNITY): Payer: Self-pay

## 2022-11-22 ENCOUNTER — Other Ambulatory Visit (HOSPITAL_COMMUNITY): Payer: Self-pay

## 2022-12-06 ENCOUNTER — Other Ambulatory Visit (HOSPITAL_COMMUNITY): Payer: Self-pay

## 2022-12-06 ENCOUNTER — Other Ambulatory Visit: Payer: Self-pay

## 2022-12-13 ENCOUNTER — Other Ambulatory Visit (HOSPITAL_COMMUNITY): Payer: Self-pay

## 2023-01-03 ENCOUNTER — Other Ambulatory Visit (HOSPITAL_COMMUNITY): Payer: Self-pay

## 2023-01-04 ENCOUNTER — Other Ambulatory Visit (HOSPITAL_COMMUNITY): Payer: Self-pay

## 2023-01-18 ENCOUNTER — Other Ambulatory Visit (HOSPITAL_COMMUNITY): Payer: Self-pay

## 2023-01-31 ENCOUNTER — Other Ambulatory Visit (HOSPITAL_COMMUNITY): Payer: Self-pay

## 2023-02-01 ENCOUNTER — Other Ambulatory Visit (HOSPITAL_COMMUNITY): Payer: Self-pay

## 2023-02-08 ENCOUNTER — Other Ambulatory Visit (HOSPITAL_COMMUNITY): Payer: Self-pay

## 2023-02-09 ENCOUNTER — Other Ambulatory Visit (HOSPITAL_COMMUNITY): Payer: Self-pay

## 2023-03-05 ENCOUNTER — Other Ambulatory Visit: Payer: Self-pay

## 2023-03-05 ENCOUNTER — Other Ambulatory Visit (HOSPITAL_COMMUNITY): Payer: Self-pay

## 2023-03-07 ENCOUNTER — Other Ambulatory Visit (HOSPITAL_COMMUNITY): Payer: Self-pay

## 2023-04-16 ENCOUNTER — Other Ambulatory Visit (HOSPITAL_COMMUNITY): Payer: Self-pay | Admitting: Family Medicine

## 2023-04-16 ENCOUNTER — Other Ambulatory Visit (HOSPITAL_COMMUNITY): Payer: Self-pay

## 2023-04-16 MED ORDER — SPIRONOLACTONE 25 MG PO TABS
25.0000 mg | ORAL_TABLET | Freq: Every day | ORAL | 0 refills | Status: DC
  Filled 2023-04-16: qty 30, 30d supply, fill #0

## 2023-04-16 MED ORDER — FUROSEMIDE 20 MG PO TABS
20.0000 mg | ORAL_TABLET | Freq: Every day | ORAL | 0 refills | Status: DC | PRN
  Filled 2023-04-16: qty 30, 30d supply, fill #0

## 2023-04-16 MED ORDER — ICOSAPENT ETHYL 1 G PO CAPS
2.0000 g | ORAL_CAPSULE | Freq: Two times a day (BID) | ORAL | 0 refills | Status: DC
  Filled 2023-04-16: qty 120, 30d supply, fill #0

## 2023-04-16 MED ORDER — ENTRESTO 49-51 MG PO TABS
1.0000 | ORAL_TABLET | Freq: Two times a day (BID) | ORAL | 0 refills | Status: DC
  Filled 2023-04-16: qty 60, 30d supply, fill #0

## 2023-05-18 ENCOUNTER — Other Ambulatory Visit (HOSPITAL_COMMUNITY): Payer: Self-pay | Admitting: Family Medicine

## 2023-05-18 ENCOUNTER — Other Ambulatory Visit (HOSPITAL_COMMUNITY): Payer: Self-pay | Admitting: Cardiology

## 2023-05-18 ENCOUNTER — Other Ambulatory Visit: Payer: Self-pay

## 2023-05-18 ENCOUNTER — Other Ambulatory Visit (HOSPITAL_COMMUNITY): Payer: Self-pay

## 2023-05-18 MED ORDER — FUROSEMIDE 20 MG PO TABS
20.0000 mg | ORAL_TABLET | Freq: Every day | ORAL | 0 refills | Status: DC | PRN
  Filled 2023-05-18: qty 30, 30d supply, fill #0

## 2023-05-18 MED ORDER — ENTRESTO 49-51 MG PO TABS
1.0000 | ORAL_TABLET | Freq: Two times a day (BID) | ORAL | 0 refills | Status: DC
  Filled 2023-05-18: qty 60, 30d supply, fill #0

## 2023-05-18 MED ORDER — SPIRONOLACTONE 25 MG PO TABS
25.0000 mg | ORAL_TABLET | Freq: Every day | ORAL | 0 refills | Status: DC
  Filled 2023-05-18: qty 30, 30d supply, fill #0

## 2023-05-18 MED ORDER — ROSUVASTATIN CALCIUM 5 MG PO TABS
5.0000 mg | ORAL_TABLET | Freq: Every day | ORAL | 0 refills | Status: DC
  Filled 2023-05-18: qty 90, 90d supply, fill #0

## 2023-05-19 ENCOUNTER — Other Ambulatory Visit (HOSPITAL_COMMUNITY): Payer: Self-pay

## 2023-05-29 ENCOUNTER — Other Ambulatory Visit (HOSPITAL_COMMUNITY): Payer: Self-pay | Admitting: Family Medicine

## 2023-05-29 ENCOUNTER — Other Ambulatory Visit (HOSPITAL_COMMUNITY): Payer: Self-pay

## 2023-05-29 ENCOUNTER — Other Ambulatory Visit (HOSPITAL_COMMUNITY): Payer: Self-pay | Admitting: Cardiology

## 2023-05-30 ENCOUNTER — Other Ambulatory Visit (HOSPITAL_COMMUNITY): Payer: Self-pay

## 2023-06-25 ENCOUNTER — Other Ambulatory Visit (HOSPITAL_COMMUNITY): Payer: Self-pay | Admitting: Family Medicine

## 2023-06-25 ENCOUNTER — Other Ambulatory Visit (HOSPITAL_COMMUNITY): Payer: Self-pay

## 2023-06-25 ENCOUNTER — Other Ambulatory Visit: Payer: Self-pay

## 2023-06-25 MED ORDER — SPIRONOLACTONE 25 MG PO TABS
25.0000 mg | ORAL_TABLET | Freq: Every day | ORAL | 0 refills | Status: DC
  Filled 2023-06-25: qty 30, 30d supply, fill #0

## 2023-06-25 MED ORDER — ROSUVASTATIN CALCIUM 5 MG PO TABS
5.0000 mg | ORAL_TABLET | Freq: Every day | ORAL | 0 refills | Status: DC
  Filled 2023-06-25 – 2023-10-23 (×2): qty 90, 90d supply, fill #0

## 2023-06-25 MED ORDER — ENTRESTO 49-51 MG PO TABS
1.0000 | ORAL_TABLET | Freq: Two times a day (BID) | ORAL | 0 refills | Status: DC
  Filled 2023-06-25: qty 60, 30d supply, fill #0

## 2023-06-25 MED ORDER — ICOSAPENT ETHYL 1 G PO CAPS
2.0000 g | ORAL_CAPSULE | Freq: Two times a day (BID) | ORAL | 0 refills | Status: DC
  Filled 2023-06-25: qty 120, 30d supply, fill #0

## 2023-06-25 MED ORDER — CARVEDILOL 6.25 MG PO TABS
ORAL_TABLET | Freq: Two times a day (BID) | ORAL | 3 refills | Status: DC
  Filled 2023-06-25: qty 180, 90d supply, fill #0
  Filled 2023-10-23: qty 180, 90d supply, fill #1
  Filled 2024-04-21: qty 180, 90d supply, fill #2

## 2023-06-25 MED ORDER — FUROSEMIDE 20 MG PO TABS
20.0000 mg | ORAL_TABLET | Freq: Every day | ORAL | 0 refills | Status: DC | PRN
  Filled 2023-06-25: qty 30, 30d supply, fill #0

## 2023-08-03 ENCOUNTER — Other Ambulatory Visit (HOSPITAL_COMMUNITY): Payer: Self-pay | Admitting: Cardiology

## 2023-08-03 DIAGNOSIS — I5022 Chronic systolic (congestive) heart failure: Secondary | ICD-10-CM

## 2023-08-21 ENCOUNTER — Other Ambulatory Visit: Payer: Self-pay

## 2023-08-21 ENCOUNTER — Other Ambulatory Visit (HOSPITAL_COMMUNITY): Payer: Self-pay

## 2023-08-21 ENCOUNTER — Other Ambulatory Visit (HOSPITAL_COMMUNITY): Payer: Self-pay | Admitting: Family Medicine

## 2023-08-21 MED ORDER — FUROSEMIDE 20 MG PO TABS
20.0000 mg | ORAL_TABLET | Freq: Every day | ORAL | 5 refills | Status: AC | PRN
  Filled 2023-08-21: qty 30, 30d supply, fill #0
  Filled 2023-10-23: qty 30, 30d supply, fill #1
  Filled 2023-11-30: qty 30, 30d supply, fill #2
  Filled 2024-02-01: qty 30, 30d supply, fill #3
  Filled 2024-04-21: qty 30, 30d supply, fill #4

## 2023-08-21 MED ORDER — ENTRESTO 49-51 MG PO TABS
1.0000 | ORAL_TABLET | Freq: Two times a day (BID) | ORAL | 11 refills | Status: DC
Start: 2023-08-21 — End: 2024-09-08
  Filled 2023-08-21: qty 60, 30d supply, fill #0
  Filled 2023-10-23: qty 60, 30d supply, fill #1
  Filled 2023-11-30: qty 60, 30d supply, fill #2
  Filled 2024-01-22: qty 60, 30d supply, fill #3
  Filled 2024-02-29 – 2024-03-10 (×2): qty 60, 30d supply, fill #4
  Filled 2024-04-21: qty 60, 30d supply, fill #5
  Filled 2024-06-09: qty 60, 30d supply, fill #6
  Filled 2024-07-23: qty 60, 30d supply, fill #7

## 2023-08-22 ENCOUNTER — Other Ambulatory Visit (HOSPITAL_COMMUNITY): Payer: Self-pay

## 2023-09-11 ENCOUNTER — Other Ambulatory Visit (HOSPITAL_COMMUNITY): Payer: Self-pay | Admitting: Family Medicine

## 2023-09-11 ENCOUNTER — Other Ambulatory Visit (HOSPITAL_COMMUNITY): Payer: Self-pay

## 2023-09-11 ENCOUNTER — Other Ambulatory Visit: Payer: Self-pay

## 2023-09-11 MED ORDER — SPIRONOLACTONE 25 MG PO TABS
25.0000 mg | ORAL_TABLET | Freq: Every day | ORAL | 3 refills | Status: AC
  Filled 2023-09-11: qty 90, 90d supply, fill #0
  Filled 2024-02-01: qty 90, 90d supply, fill #1
  Filled 2024-06-09: qty 90, 90d supply, fill #2
  Filled 2024-09-08: qty 90, 90d supply, fill #3

## 2023-09-11 MED ORDER — ICOSAPENT ETHYL 1 G PO CAPS
2.0000 g | ORAL_CAPSULE | Freq: Two times a day (BID) | ORAL | 3 refills | Status: DC
  Filled 2023-09-11: qty 360, 90d supply, fill #0
  Filled 2024-01-22: qty 360, 90d supply, fill #1
  Filled 2024-06-09: qty 360, 90d supply, fill #2

## 2023-09-12 ENCOUNTER — Other Ambulatory Visit (HOSPITAL_COMMUNITY): Payer: Self-pay

## 2023-10-15 ENCOUNTER — Encounter (HOSPITAL_COMMUNITY): Payer: Self-pay | Admitting: Cardiology

## 2023-10-15 ENCOUNTER — Ambulatory Visit (HOSPITAL_COMMUNITY)
Admission: RE | Admit: 2023-10-15 | Discharge: 2023-10-15 | Disposition: A | Discharge status: Home / Self Care | Payer: 59 | Source: Ambulatory Visit | Attending: Cardiology | Admitting: Cardiology

## 2023-10-15 VITALS — BP 110/70 | HR 58 | Wt 179.6 lb

## 2023-10-15 DIAGNOSIS — I5022 Chronic systolic (congestive) heart failure: Secondary | ICD-10-CM

## 2023-10-15 DIAGNOSIS — E785 Hyperlipidemia, unspecified: Secondary | ICD-10-CM | POA: Insufficient documentation

## 2023-10-15 DIAGNOSIS — I2510 Atherosclerotic heart disease of native coronary artery without angina pectoris: Secondary | ICD-10-CM | POA: Diagnosis not present

## 2023-10-15 DIAGNOSIS — I428 Other cardiomyopathies: Secondary | ICD-10-CM | POA: Diagnosis not present

## 2023-10-15 LAB — BASIC METABOLIC PANEL
Anion gap: 9 (ref 5–15)
BUN: 27 mg/dL — ABNORMAL HIGH (ref 6–20)
CO2: 24 mmol/L (ref 22–32)
Calcium: 9.7 mg/dL (ref 8.9–10.3)
Chloride: 99 mmol/L (ref 98–111)
Creatinine, Ser: 1.36 mg/dL — ABNORMAL HIGH (ref 0.44–1.00)
GFR, Estimated: 45 mL/min — ABNORMAL LOW (ref >60)
Glucose, Bld: 103 mg/dL — ABNORMAL HIGH (ref 70–99)
Potassium: 5.3 mmol/L — ABNORMAL HIGH (ref 3.5–5.1)
Sodium: 132 mmol/L — ABNORMAL LOW (ref 135–145)

## 2023-10-15 LAB — LIPID PANEL
Cholesterol: 150 mg/dL (ref 0–200)
HDL: 42 mg/dL (ref >40)
LDL Cholesterol: 94 mg/dL (ref 0–99)
Total CHOL/HDL Ratio: 3.6 {ratio}
Triglycerides: 69 mg/dL (ref <150)
VLDL: 14 mg/dL (ref 0–40)

## 2023-10-15 LAB — ECHOCARDIOGRAM COMPLETE
Area-P 1/2: 2.45 cm2
S' Lateral: 4.1 cm

## 2023-10-15 LAB — BRAIN NATRIURETIC PEPTIDE: B Natriuretic Peptide: 90.9 pg/mL (ref 0.0–100.0)

## 2023-10-15 NOTE — Patient Instructions (Signed)
Continue taking your Asprin.  Labs done today, your results will be available in MyChart, we will contact you for abnormal readings.  Your physician recommends that you schedule a follow-up appointment in: 4 months at the [facility] office ( March 2025) ** PLEASE CALL THE OFFICE IN [place] TO ARRANGE YOUR FOLLOW UP APPOINTMENT. **  If you have any questions or concerns before your next appointment please send [place] a message through [place] or call our office at [phone].    TO LEAVE A MESSAGE FOR THE NURSE SELECT OPTION 2, PLEASE LEAVE A MESSAGE INCLUDING: YOUR NAME DATE OF BIRTH CALL BACK NUMBER REASON FOR CALL**this is important as we prioritize the call backs  YOU WILL RECEIVE A CALL BACK THE SAME DAY AS LONG AS YOU CALL BEFORE 4:00 PM  At the [facility], you and your health needs are our priority. As part of our continuing mission to provide you with exceptional heart care, we have created designated Provider Care Teams. These Care Teams include your primary Cardiologist (physician) and Advanced Practice Providers (APPs- Physician Assistants and Nurse Practitioners) who all work together to provide you with the care you need, when you need it.   You may see any of the following providers on your designated Care Team at your next follow up: Dr Arvilla Meres Dr Marca Ancona Dr. Dorthula Nettles Dr. Clearnce Hasten Amy Filbert Schilder, NP Robbie Lis, [place] [facility] [place], [place] Brynda Peon, NP [place] Lee, NP Karle Plumber, PharmD   Please be sure to bring in all your medications bottles to every appointment.    Thank you for choosing [facility]

## 2023-10-15 NOTE — Progress Notes (Addendum)
PCP: None  Vascular : Dr Darrick Penna  Ortho: Dr Lajoyce Corners  [facility]  HF Cardiologist: Dr Shirlee Latch  HPI: Briana Andrews is a 57 y.o. woman with a history of R frontal CVA, B popliteal artery occlusions, hepatic failure, GI bleeding, cirrhosis, ETOH abuse, and chronic systolic heart failure.  Admitted 08/15/19 with increased sob and confusion. Treated for PNA and MRI brain showed R ACA stroke. Hospital course complicated new onset acute systolic heart failure possibly due to ETOH abuse. Hospital course complicated by LV thrombus, hepatitis, ARF, lower extremity cardioembolism, and shock.  She had suspected cardioembolism to bilateral popliteal arteries and underwent mechanical thrombectomy.  Received PRBCs and was able to undergo EGD 9/29 that showed duodenal and gastric ulcers which were treated endoscopically. Continued on PPI.  Heparin has been converted to coumadin. R foot wound was to be followed by Vascular in the community  Discharged to SNF. On 09/21/19 she presented to the [facility] after a fall that resulted in head laceration. CT scan was unremarkable. She left SNF on 09/27/19 because she was not taken to any of her follow up appointments.    She has had follow up with VVS/Ortho for R foot wound and had debridement 10/14/19.   Echo in 3/21 showed EF increased to 45-50%, mild LV dilation, normal RV.   Echo 2/22 EF up to 55-60% with normal RV.  Echo was done today and reviewed, EF 50-55%, mild LVH, mild RV dysfunction, IVC normal.   Today she returns for HF follow up. She is not smoking or drinking ETOH.  No exertional dyspnea or chest pain.  No orthopnea/PND. She is taking Lasix 20 mg daily.  No palpitations or lightheadedness. Weight is up.   ECG (personally reviewed): NSR, normal  Labs (11/20): K 4.2, creatinine 0.97 Labs (12/20): LDL 55, TGs 259 Labs (3/21): K 4.3, 0.99 Labs (5/21): LDL 71, HDL 38, TGs 134 Labs (6/21): K 4.8, creatinine 1.05 Labs (11/21): LDL 74, HDL 30 Labs (12/21): K  4.8, creatinine 1.13 Labs (2/22): K 4.9, creatinine 1.17 Labs (11/23): LDL 83  1. Chronic systolic CHF: Primarily nonischemic cardiomyopathy.   - Echo (9/20): EF 15-20% with LV apical thrombus. Severely decreased RV function.  - RHC (10/20): RA 6, CI 3.7, PVR 3.7  - LHC (10/20): LAD 40%, Left Circumflex 50% , 80% proximal RCA, 60% mid PDA stenosis - Echo (3/21): EF increased to 45-50%, mild LV dilation, normal RV.  - Echo (2/22): EF 55-60%, normal RV.  - Echo (11/24): EF 50-55%, mild LVH, mild RV dysfunction, IVC normal.  2. GI Bleed: Gastric and duodenal ulcers in 9/20.  3. R frontal CVA: In 9/20, due to LV thrombus.  4. 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   - ABIs (11/20): Normal.  5. 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  Liver imaging did not show cirrhosis or portal vein thrombosis. RV failure/congestive hepatopathy may have played a role.  6. ETOH Abuse  7. LV thrombus 8. CAD: LHC 10/20 LAD 40%, Left Circumflex 50% , RCA 80% Proximal RCA 60% mid PDA stenosis.  9. DVT: Right popliteal vein in 9/20.   ROS: All systems negative except as listed in HPI, PMH and Problem List.  SH:  Social History   Socioeconomic History   Marital status: Married    Spouse name: Not on file   Number of children: Not on file   Years of education: Not on file   Highest education level: Not on file  Occupational History  Not on file  Tobacco Use   Smoking status: Former    Current packs/day: 0.00    Types: Cigarettes    Start date: 06/1980    Quit date: 06/2019    Years since quitting: 4.3   Smokeless tobacco: Never  Vaping Use   Vaping status: Never Used  Substance and Sexual Activity   Alcohol use: Not Currently    Comment: alcoholism - last drink in august 2020   Drug use: Not Currently    Types: Marijuana    Comment: none since August 2020   Sexual activity: Not on file  Other Topics Concern   Not on file  Social History  Narrative   Not on file   Social Determinants of Health   Financial Resource Strain: Not on file  Food Insecurity: Not on file  Transportation Needs: Not on file  Physical Activity: Not on file  Stress: Not on file  Social Connections: Not on file  Intimate Partner Violence: Not on file    FH:  Family History  Problem Relation Age of Onset   Heart disease Mother     Current Outpatient Medications  Medication Sig Dispense Refill   carvedilol (COREG) 6.25 MG tablet TAKE 1 TABLET BY MOUTH TWO TIMES DAILY WITH MEALS 180 tablet 3   diphenhydrAMINE (BENADRYL) 25 mg capsule Take 25 mg by mouth as needed.     furosemide (LASIX) 20 MG tablet Take 1 tablet (20 mg total) by mouth daily as needed for fluid retention. 30 tablet 5   icosapent Ethyl (VASCEPA) 1 g capsule Take 2 capsules (2 g total) by mouth 2 (two) times daily. 360 capsule 3   rosuvastatin (CRESTOR) 5 MG tablet Take 1 tablet (5 mg total) by mouth daily. 90 tablet 0   sacubitril-valsartan (ENTRESTO) 49-51 MG Take 1 tablet by mouth 2 (two) times daily. Needs appt 60 tablet 11   spironolactone (ALDACTONE) 25 MG tablet Take 1 tablet (25 mg total) by mouth daily. 90 tablet 3   aspirin 81 MG EC tablet Take 1 tablet by mouth daily. NEEDS APPOINTMENT FOR ANYMORE REFILLS (Patient not taking: Reported on 10/15/2023) 15 tablet 0   ibuprofen (ADVIL) 200 MG tablet Take 200 mg by mouth every 6 (six) hours as needed for headache or moderate pain.     No current facility-administered medications for this encounter.    BP 110/70   Pulse (!) 58   Wt 81.5 kg (179 lb 9.6 oz)   SpO2 98%   BMI 30.83 kg/m   Wt Readings from Last 3 Encounters:  10/15/23 81.5 kg (179 lb 9.6 oz)  03/17/22 78.8 kg (173 lb 12.8 oz)  01/07/21 73.4 kg (161 lb 12.8 oz)   PHYSICAL EXAM: General: NAD Neck: No JVD, no thyromegaly or thyroid nodule.  Lungs: Clear to auscultation bilaterally with normal respiratory effort. CV: Nondisplaced PMI.  Heart regular S1/S2,  no S3/S4, no murmur.  No peripheral edema.  No carotid bruit.  Normal pedal pulses.  Abdomen: Soft, nontender, no hepatosplenomegaly, no distention.  Skin: Intact without lesions or rashes.  Neurologic: Alert and oriented x 3.  Psych: Normal affect. Extremities: No clubbing or cyanosis.  HEENT: Normal.   ASSESSMENT & PLAN: 1. Chronic systolic CHF:  Cardiogenic shock in 9/20, echo 07/2019 with EF 15-20%, mild LV dilation, LV thrombus, mild-moderate MR, severely decreased RV systolic function.  LHC/RHC done 10/20 showing normal filling pressures and preserved cardiac output.  There was diffuse coronary disease, especially involving distal and branch  vessels, only possible interventional target was 80% proximal RCA stenosis.  No intervention given good flow down the vessel and unlikely to significantly improve LV function.  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 Echo in 3/21 showed EF up to 45-50% and echo 2/22 showed EF up to 55-60%.  Echo today showed EF 50-55%, mild LVH, mild RV dysfunction, IVC normal.  NYHA class I, not volume overloaded on exam.  - Continue spironolactone 25 mg daily. BMET/BNP today.  - Continue Coreg 6.25 mg bid.   - Continue Entresto 49/51 bid. BMET today.  - Continue to abstain from ETOH.  - She has been taking Lasix 20 mg daily, can continue.  - Not on SGLT2 inhibitor, but with no volume overload and nearly normal EF, will not start. Can consider replacing Lasix with Farxiga 10 mg daily at next appt.  2. Suspected ETOH hepatitis:  Elevated LFTs during 9/20 admission.  However liver imaging with [place] and CT has NOT shown cirrhosis or portal/hepatic vein thrombosis. Question has arisen whether this might possibly be due primarily to RV failure.  - Check LFTs today.  3.  LV thrombus: Possible cause of embolic event to popliteal arteries as well as subacute CVA.  - Warfarin has been stopped with EF back to  near normal range,  - Continue ASA 81 daily.  4. PAD: Bilateral popliteal artery occlusions, suspect cardioembolic from LV thrombus.  Now s/p mechanical thrombectomy by vascular.  Right foot wound followed by Dr. Lajoyce Corners, has had skin graft and healed.   5. CVA: Subacute right ACA CVA in 9/20. Likely from LV thrombus.   - Off warfarin and on  ASA 81 daily.  6. Right popliteal vein DVT: 9/20.  - Continue ASA. 7. H/O GI bleeding: Upper GI bleeding, EGD with bleeding duodenal and gastric ulcer in 9/20 that were treated, no varices.  - Continue Protonix 8. H/O ETOH abuse: She has quit drinking (except for special occasions) now since August 2020.  This may have helped LV function.   9. CAD: Extensive distal and branch vessel coronary disease on cath 10/20.  Only possible interventional target was 80% proximal RCA stenosis.  However, there was good flow down the RCA and I did not think intervention on this lesion would appreciably improve her LV function at the time.  No chest pain.  - Continue Crestor 5 mg daily. Check lipids today. - Continue Vascepa. - Continue ASA.  Followup at [facility] office with me in 4 months.   Marca Ancona 10/15/23

## 2023-10-16 ENCOUNTER — Telehealth (HOSPITAL_COMMUNITY): Payer: Self-pay

## 2023-10-16 DIAGNOSIS — I5022 Chronic systolic (congestive) heart failure: Secondary | ICD-10-CM

## 2023-10-16 NOTE — Telephone Encounter (Signed)
-----   Message from Marca Ancona sent at 10/15/2023  4:52 PM EST ----- Low K diet.  Repeat BMET 2 wks.

## 2023-10-16 NOTE — Telephone Encounter (Signed)
Spoke with patient regarding the following results. Patient made aware and patient verbalized understanding.   Patient would like repeat BMET in 2 weeks done in [place]. [facility] orders placed.

## 2023-10-23 ENCOUNTER — Other Ambulatory Visit: Payer: Self-pay

## 2023-10-23 ENCOUNTER — Other Ambulatory Visit (HOSPITAL_COMMUNITY): Payer: Self-pay

## 2023-11-30 ENCOUNTER — Other Ambulatory Visit (HOSPITAL_COMMUNITY): Payer: Self-pay

## 2024-01-22 ENCOUNTER — Other Ambulatory Visit: Payer: Self-pay

## 2024-01-22 ENCOUNTER — Other Ambulatory Visit (HOSPITAL_COMMUNITY): Payer: Self-pay

## 2024-01-23 ENCOUNTER — Other Ambulatory Visit (HOSPITAL_COMMUNITY): Payer: Self-pay

## 2024-01-30 ENCOUNTER — Other Ambulatory Visit (HOSPITAL_COMMUNITY): Payer: Self-pay

## 2024-02-01 ENCOUNTER — Other Ambulatory Visit (HOSPITAL_COMMUNITY): Payer: Self-pay

## 2024-02-04 ENCOUNTER — Other Ambulatory Visit (HOSPITAL_COMMUNITY): Payer: Self-pay

## 2024-02-15 ENCOUNTER — Telehealth: Payer: Self-pay | Admitting: Cardiology

## 2024-02-15 NOTE — Telephone Encounter (Signed)
 Called to schedule recall

## 2024-02-22 ENCOUNTER — Telehealth: Payer: Self-pay | Admitting: Cardiology

## 2024-02-22 NOTE — Telephone Encounter (Incomplete)
 Called to confirm/remind patient of their appointment at the [facility] on 02/26/24***.   Appointment:   [x] Confirmed  [] Left mess   [] No answer/No voice mail  [] Phone not in service  Patient reminded to bring all medications and/or complete list.  Confirmed patient has transportation. Gave directions, instructed to utilize valet parking.

## 2024-02-25 ENCOUNTER — Telehealth: Payer: Self-pay | Admitting: Cardiology

## 2024-02-25 NOTE — Telephone Encounter (Signed)
 Called to confirm/remind patient of their appointment at the [facility] on 02/25/24.   Appointment:   [] Confirmed  [x] Left mess   [] No answer/No voice mail  [] Phone not in service  Patient reminded to bring all medications and/or complete list.  Confirmed patient has transportation. Gave directions, instructed to utilize valet parking.

## 2024-02-26 ENCOUNTER — Other Ambulatory Visit (HOSPITAL_COMMUNITY): Payer: Self-pay

## 2024-02-26 ENCOUNTER — Encounter: Payer: Self-pay | Admitting: Cardiology

## 2024-02-26 ENCOUNTER — Other Ambulatory Visit
Admission: RE | Admit: 2024-02-26 | Discharge: 2024-02-26 | Disposition: A | Discharge status: Home / Self Care | Source: Ambulatory Visit | Attending: Family | Admitting: Family

## 2024-02-26 ENCOUNTER — Ambulatory Visit (HOSPITAL_BASED_OUTPATIENT_CLINIC_OR_DEPARTMENT_OTHER): Admitting: Family

## 2024-02-26 VITALS — BP 154/73 | HR 62 | Wt 181.8 lb

## 2024-02-26 DIAGNOSIS — I5022 Chronic systolic (congestive) heart failure: Secondary | ICD-10-CM | POA: Insufficient documentation

## 2024-02-26 DIAGNOSIS — K759 Inflammatory liver disease, unspecified: Secondary | ICD-10-CM

## 2024-02-26 DIAGNOSIS — I240 Acute coronary thrombosis not resulting in myocardial infarction: Secondary | ICD-10-CM

## 2024-02-26 DIAGNOSIS — F1011 Alcohol abuse, in remission: Secondary | ICD-10-CM

## 2024-02-26 DIAGNOSIS — Z8719 Personal history of other diseases of the digestive system: Secondary | ICD-10-CM

## 2024-02-26 DIAGNOSIS — Z86718 Personal history of other venous thrombosis and embolism: Secondary | ICD-10-CM

## 2024-02-26 DIAGNOSIS — I739 Peripheral vascular disease, unspecified: Secondary | ICD-10-CM | POA: Diagnosis not present

## 2024-02-26 DIAGNOSIS — I2510 Atherosclerotic heart disease of native coronary artery without angina pectoris: Secondary | ICD-10-CM

## 2024-02-26 DIAGNOSIS — Z8673 Personal history of transient ischemic attack (TIA), and cerebral infarction without residual deficits: Secondary | ICD-10-CM

## 2024-02-26 LAB — BASIC METABOLIC PANEL WITH GFR
Anion gap: 10 (ref 5–15)
BUN: 25 mg/dL — ABNORMAL HIGH (ref 6–20)
CO2: 19 mmol/L — ABNORMAL LOW (ref 22–32)
Calcium: 9.2 mg/dL (ref 8.9–10.3)
Chloride: 103 mmol/L (ref 98–111)
Creatinine, Ser: 1.15 mg/dL — ABNORMAL HIGH (ref 0.44–1.00)
GFR, Estimated: 55 mL/min — ABNORMAL LOW (ref >60)
Glucose, Bld: 102 mg/dL — ABNORMAL HIGH (ref 70–99)
Potassium: 4.2 mmol/L (ref 3.5–5.1)
Sodium: 132 mmol/L — ABNORMAL LOW (ref 135–145)

## 2024-02-26 MED ORDER — ROSUVASTATIN CALCIUM 10 MG PO TABS
10.0000 mg | ORAL_TABLET | Freq: Every day | ORAL | 3 refills | Status: AC
  Filled 2024-02-26 – 2024-03-10 (×3): qty 90, 90d supply, fill #0
  Filled 2024-09-08: qty 90, 90d supply, fill #1

## 2024-02-26 MED ORDER — DAPAGLIFLOZIN PROPANEDIOL 10 MG PO TABS
10.0000 mg | ORAL_TABLET | Freq: Every day | ORAL | 3 refills | Status: AC
  Filled 2024-02-26 – 2024-03-10 (×4): qty 90, 90d supply, fill #0
  Filled 2024-06-09: qty 90, 90d supply, fill #1
  Filled 2024-09-08: qty 90, 90d supply, fill #2
  Filled 2024-12-02: qty 90, 90d supply, fill #3

## 2024-02-26 NOTE — Progress Notes (Signed)
 [facility]  PCP: None  Vascular : Dr Darrick Penna  Ortho: Dr Lajoyce Corners  [facility] [place] [facility]  HF Cardiologist: Dr Shirlee Latch  Chief Complaint: fatigue  HPI: Briana Andrews is a 58 y.o. woman with a history of R frontal CVA, B popliteal artery occlusions, hepatic failure, GI bleeding, cirrhosis, ETOH abuse, and chronic systolic heart failure.  Admitted 08/15/19 with increased sob and confusion. Treated for PNA and MRI brain showed R ACA stroke. Hospital course complicated new onset acute systolic heart failure possibly due to ETOH abuse. Hospital course complicated by LV thrombus, hepatitis, ARF, lower extremity cardioembolism, and shock.  She had suspected cardioembolism to bilateral popliteal arteries and underwent mechanical thrombectomy.  Received PRBCs and was able to undergo EGD 9/29 that showed duodenal and gastric ulcers which were treated endoscopically. Continued on PPI.  Heparin has been converted to coumadin. R foot wound was to be followed by Vascular in the community  Discharged to SNF. On 09/21/19 she presented to the [facility] after a fall that resulted in head laceration. CT scan was unremarkable. She left SNF on 09/27/19 because she was not taken to any of her follow up appointments.    She has had follow up with VVS/Ortho for R foot wound and had debridement 10/14/19.   Echo in 3/21 showed EF increased to 45-50%, mild LV dilation, normal RV.   Echo 2/22 EF up to 55-60% with normal RV.  Echo was done today and reviewed, EF 50-55%, mild LVH, mild RV dysfunction, IVC normal.   Today she returns, with her husband, for a HF follow up visit with a chief complaint of minimal fatigue. Has occasional palpitations. Sleeping well on 1-2 pillows. Denies chest pain, palpitations, abdominal distention, pedal edema or dizziness. Has been quite active working in her garden.   She was supposed to have had lab work rechecked a couple of weeks after last visit but says that life just got  busy and she never got it drawn. Quit tobacco 2019, drinks etoh 1-2 beers every other day  ECG not done  Labs (11/20): K 4.2, creatinine 0.97 Labs (12/20): LDL 55, TGs 259 Labs (3/21): K 4.3, 0.99 Labs (5/21): LDL 71, HDL 38, TGs 134 Labs (6/21): K 4.8, creatinine 1.05 Labs (11/21): LDL 74, HDL 30 Labs (12/21): K 4.8, creatinine 1.13 Labs (2/22): K 4.9, creatinine 1.17 Labs (11/23): LDL 83 Labs (11/24): K 5.3, Cr 1.36, LDL 94  1. Chronic systolic CHF: Primarily nonischemic cardiomyopathy.   - Echo (9/20): EF 15-20% with LV apical thrombus. Severely decreased RV function.  - RHC (10/20): RA 6, CI 3.7, PVR 3.7  - LHC (10/20): LAD 40%, Left Circumflex 50% , 80% proximal RCA, 60% mid PDA stenosis - Echo (3/21): EF increased to 45-50%, mild LV dilation, normal RV.  - Echo (2/22): EF 55-60%, normal RV.  - Echo (11/24): EF 50-55%, mild LVH, mild RV dysfunction, IVC normal.  2. GI Bleed: Gastric and duodenal ulcers in 9/20.  3. R frontal CVA: In 9/20, due to LV thrombus.  4. 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   - ABIs (11/20): Normal.  5. 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  Liver imaging did not show cirrhosis or portal vein thrombosis. RV failure/congestive hepatopathy may have played a role.  6. ETOH Abuse  7. LV thrombus 8. CAD: LHC 10/20 LAD 40%, Left Circumflex 50% , RCA 80% Proximal RCA 60% mid PDA stenosis.  9. DVT:  Right popliteal vein in 9/20.   ROS: All systems negative except as listed in HPI, PMH and Problem List.  SH:  Social History   Socioeconomic History   Marital status: Married    Spouse name: Not on file   Number of children: Not on file   Years of education: Not on file   Highest education level: Not on file  Occupational History   Not on file  Tobacco Use   Smoking status: Former    Current packs/day: 0.00    Types: Cigarettes    Start date: 06/1980    Quit date: 06/2019    Years since quitting: 4.6    Smokeless tobacco: Never  Vaping Use   Vaping status: Never Used  Substance and Sexual Activity   Alcohol use: Not Currently    Comment: alcoholism - last drink in august 2020   Drug use: Not Currently    Types: Marijuana    Comment: none since August 2020   Sexual activity: Not on file  Other Topics Concern   Not on file  Social History Narrative   Not on file   Social Drivers of Health   Financial Resource Strain: Not on file  Food Insecurity: Not on file  Transportation Needs: Not on file  Physical Activity: Not on file  Stress: Not on file  Social Connections: Not on file  Intimate Partner Violence: Not on file    FH:  Family History  Problem Relation Age of Onset   Heart disease Mother     Current Outpatient Medications  Medication Sig Dispense Refill   aspirin 81 MG EC tablet Take 1 tablet by mouth daily. NEEDS APPOINTMENT FOR ANYMORE REFILLS 15 tablet 0   carvedilol (COREG) 6.25 MG tablet TAKE 1 TABLET BY MOUTH TWO TIMES DAILY WITH MEALS 180 tablet 3   loratadine (CLARITIN) 10 MG tablet Take 10 mg by mouth daily as needed for allergies.     furosemide (LASIX) 20 MG tablet Take 1 tablet (20 mg total) by mouth daily as needed for fluid retention. 30 tablet 5   ibuprofen (ADVIL) 200 MG tablet Take 200 mg by mouth every 6 (six) hours as needed for headache or moderate pain.     icosapent Ethyl (VASCEPA) 1 g capsule Take 2 capsules (2 g total) by mouth 2 (two) times daily. 360 capsule 3   rosuvastatin (CRESTOR) 5 MG tablet Take 1 tablet (5 mg total) by mouth daily. 90 tablet 0   sacubitril-valsartan (ENTRESTO) 49-51 MG Take 1 tablet by mouth 2 (two) times daily. Needs appt 60 tablet 11   spironolactone (ALDACTONE) 25 MG tablet Take 1 tablet (25 mg total) by mouth daily. 90 tablet 3   No current facility-administered medications for this visit.   Vitals:   02/26/24 1532  BP: (!) 154/73  Pulse: 62  SpO2: 100%  Weight: 181 lb 12.8 oz (82.5 kg)   Wt Readings from  Last 3 Encounters:  02/26/24 181 lb 12.8 oz (82.5 kg)  10/15/23 179 lb 9.6 oz (81.5 kg)  03/17/22 173 lb 12.8 oz (78.8 kg)   Lab Results  Component Value Date   CREATININE 1.15 (H) 02/26/2024   CREATININE 1.36 (H) 10/15/2023   CREATININE 1.21 (H) 03/17/2022    PHYSICAL EXAM:  General: Well appearing. No resp difficulty HEENT: normal Neck: supple, no JVD Cor: Regular rhythm, rate. No rubs, gallops or murmurs Lungs: clear Abdomen: soft, nontender, nondistended. Extremities: no cyanosis, clubbing, rash, edema Neuro: alert & oriented X  3. Moves all 4 extremities w/o difficulty. Affect pleasant   ASSESSMENT & PLAN:  1. Chronic systolic CHF:  Cardiogenic shock in 9/20, echo 07/2019 with EF 15-20%, mild LV dilation, LV thrombus, mild-moderate MR, severely decreased RV systolic function.  LHC/RHC done 10/20 showing normal filling pressures and preserved cardiac output. There was diffuse coronary disease, especially involving distal and branch vessels, only possible interventional target was 80% proximal RCA stenosis. No intervention given good flow down the vessel and unlikely to significantly improve LV function. 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 Echo in 3/21 showed EF up to 45-50% and echo 2/22 showed EF up to 55-60%. Echo 11/24 showed EF 50-55%, mild LVH, mild RV dysfunction, IVC normal.   - NYHA class II - euvolemic - Continue spironolactone 25 mg daily. BMET/ today - Continue Coreg 6.25 mg bid.   - Continue Entresto 49/51 bid.  - Begin Farxiga 10mg  daily and take furosemide 20mg  only PRN - Complete etoh cessation encouraged  2. Suspected ETOH hepatitis:  Elevated LFTs during 9/20 admission.  However liver imaging with [place] and CT has NOT shown cirrhosis or portal/hepatic vein thrombosis. Question has arisen whether this might possibly be due primarily to RV failure.   3.  LV thrombus: Possible cause of embolic  event to popliteal arteries as well as subacute CVA.  - Warfarin has been stopped with EF back to near normal range,  - Continue ASA 81 daily.   4. PAD: Bilateral popliteal artery occlusions, suspect cardioembolic from LV thrombus.  Now s/p mechanical thrombectomy by vascular.  Right foot wound followed by Dr. Lajoyce Corners, has had skin graft and healed.    5. CVA: Subacute right ACA CVA in 9/20. Likely from LV thrombus.   - Off warfarin and on ASA 81 daily.   6. Right popliteal vein DVT: 9/20.  - Continue ASA.  7. H/O GI bleeding: Upper GI bleeding, EGD with bleeding duodenal and gastric ulcer in 9/20 that were treated, no varices.  - Continue Protonix  8. H/O ETOH abuse: She drinks 1-2 twelve ounce cans every couple of days. Complete cessation encouraged.  9. CAD: Extensive distal and branch vessel coronary disease on cath 10/20.  Only possible interventional target was 80% proximal RCA stenosis.  However, there was good flow down the RCA and I did not think intervention on this lesion would appreciably improve her LV function at the time.  No chest pain.  - Increase Crestor to 10 mg daily. LDL 10/16/23 was 94 - Continue Vascepa. - Continue ASA.   Return in 4 months with Dr Shirlee Latch, sooner if needed.   Briana Andrews 02/26/24

## 2024-02-26 NOTE — Patient Instructions (Addendum)
 Medication Changes:  START FARXIGA 10 MG ONCE DAILY.  INCREASE YOUR CRESTOR TO 10 MG ONCE DAILY.  TAKE LASIX AS NEEDED WITH WT GAIN AND SWELLING.   Lab Work:  Go over to the [facility]. Go pass the gift shop and have your blood work completed.  We will only call you if the results are abnormal or if the provider would like to make medication changes.   Follow-Up in: 4 MONTHS WITH DR. Shirlee Latch.  Our Doctors' schedules are NOT open yet for 4 months. We will place you on our recall list. Once they are available, we will call you to schedule your follow up appointment.   At the [facility], you and your health needs are our priority. We have a designated team specialized in the treatment of Heart Failure. This Care Team includes your primary Heart Failure Specialized Cardiologist (physician), Advanced Practice Providers (APPs- Physician Assistants and Nurse Practitioners), and Pharmacist who all work together to provide you with the care you need, when you need it.   You may see any of the following providers on your designated Care Team at your next follow up:  Dr. Arvilla Meres Dr. Marca Ancona Dr. Dorthula Nettles Dr. Theresia Bough Clarisa Kindred, FNP Enos Fling, RPH-CPP  Please be sure to bring in all your medications bottles to every appointment.   Need to Contact us:  If you have any questions or concerns before your next appointment please send [place] a message through [place] or call our office at [phone].    TO LEAVE A MESSAGE FOR THE NURSE SELECT OPTION 2, PLEASE LEAVE A MESSAGE INCLUDING: YOUR NAME DATE OF BIRTH CALL BACK NUMBER REASON FOR CALL**this is important as we prioritize the call backs  YOU WILL RECEIVE A CALL BACK THE SAME DAY AS LONG AS YOU CALL BEFORE 4:00 PM

## 2024-02-27 ENCOUNTER — Other Ambulatory Visit (HOSPITAL_COMMUNITY): Payer: Self-pay

## 2024-02-27 ENCOUNTER — Other Ambulatory Visit: Payer: Self-pay

## 2024-02-28 ENCOUNTER — Other Ambulatory Visit: Payer: Self-pay

## 2024-02-29 ENCOUNTER — Other Ambulatory Visit (HOSPITAL_COMMUNITY): Payer: Self-pay

## 2024-03-03 ENCOUNTER — Other Ambulatory Visit (HOSPITAL_COMMUNITY): Payer: Self-pay

## 2024-03-05 ENCOUNTER — Other Ambulatory Visit (HOSPITAL_COMMUNITY): Payer: Self-pay

## 2024-03-08 ENCOUNTER — Other Ambulatory Visit (HOSPITAL_COMMUNITY): Payer: Self-pay

## 2024-03-10 ENCOUNTER — Other Ambulatory Visit (HOSPITAL_COMMUNITY): Payer: Self-pay

## 2024-04-21 ENCOUNTER — Other Ambulatory Visit (HOSPITAL_COMMUNITY): Payer: Self-pay

## 2024-04-22 ENCOUNTER — Other Ambulatory Visit: Payer: Self-pay

## 2024-04-22 ENCOUNTER — Other Ambulatory Visit (HOSPITAL_COMMUNITY): Payer: Self-pay

## 2024-04-25 ENCOUNTER — Other Ambulatory Visit: Payer: Self-pay

## 2024-04-25 ENCOUNTER — Other Ambulatory Visit (HOSPITAL_COMMUNITY): Payer: Self-pay

## 2024-06-09 ENCOUNTER — Other Ambulatory Visit (HOSPITAL_COMMUNITY): Payer: Self-pay

## 2024-06-12 ENCOUNTER — Other Ambulatory Visit (HOSPITAL_COMMUNITY): Payer: Self-pay

## 2024-07-23 ENCOUNTER — Other Ambulatory Visit (HOSPITAL_COMMUNITY): Payer: Self-pay

## 2024-08-15 ENCOUNTER — Telehealth: Payer: Self-pay | Admitting: Cardiology

## 2024-09-08 ENCOUNTER — Other Ambulatory Visit: Payer: Self-pay

## 2024-09-08 ENCOUNTER — Other Ambulatory Visit (HOSPITAL_COMMUNITY): Payer: Self-pay

## 2024-09-08 ENCOUNTER — Other Ambulatory Visit (HOSPITAL_COMMUNITY): Payer: Self-pay | Admitting: Family Medicine

## 2024-09-08 MED ORDER — SACUBITRIL-VALSARTAN 49-51 MG PO TABS
1.0000 | ORAL_TABLET | Freq: Two times a day (BID) | ORAL | 11 refills | Status: AC
  Filled 2024-09-08: qty 60, 30d supply, fill #0
  Filled 2024-10-22: qty 60, 30d supply, fill #1
  Filled 2024-12-02: qty 60, 30d supply, fill #2

## 2024-09-08 MED ORDER — CARVEDILOL 6.25 MG PO TABS
6.2500 mg | ORAL_TABLET | Freq: Two times a day (BID) | ORAL | 3 refills | Status: AC
  Filled 2024-09-08: qty 180, 90d supply, fill #0

## 2024-09-09 ENCOUNTER — Other Ambulatory Visit: Payer: Self-pay

## 2024-09-09 ENCOUNTER — Other Ambulatory Visit (HOSPITAL_COMMUNITY): Payer: Self-pay

## 2024-10-22 ENCOUNTER — Other Ambulatory Visit (HOSPITAL_COMMUNITY): Payer: Self-pay

## 2024-10-24 ENCOUNTER — Other Ambulatory Visit (HOSPITAL_COMMUNITY): Payer: Self-pay

## 2024-12-02 ENCOUNTER — Other Ambulatory Visit (HOSPITAL_COMMUNITY): Payer: Self-pay | Admitting: Family Medicine

## 2024-12-02 ENCOUNTER — Other Ambulatory Visit (HOSPITAL_COMMUNITY): Payer: Self-pay

## 2024-12-02 MED ORDER — ICOSAPENT ETHYL 1 G PO CAPS
2.0000 g | ORAL_CAPSULE | Freq: Two times a day (BID) | ORAL | 0 refills | Status: AC
  Filled 2024-12-02: qty 360, 90d supply, fill #0
